# Patient Record
Sex: Female | Born: 1950 | Race: White | Hispanic: No | Smoking: Former smoker
Health system: Southern US, Community
[De-identification: ages and names within clinical notes are randomized; demographics above are authoritative.]

## PROBLEM LIST (undated history)

## (undated) DIAGNOSIS — N952 Postmenopausal atrophic vaginitis: Secondary | ICD-10-CM

## (undated) DIAGNOSIS — N898 Other specified noninflammatory disorders of vagina: Secondary | ICD-10-CM

## (undated) DIAGNOSIS — J449 Chronic obstructive pulmonary disease, unspecified: Secondary | ICD-10-CM

## (undated) DIAGNOSIS — M8580 Other specified disorders of bone density and structure, unspecified site: Secondary | ICD-10-CM

## (undated) DIAGNOSIS — R7301 Impaired fasting glucose: Secondary | ICD-10-CM

## (undated) DIAGNOSIS — R079 Chest pain, unspecified: Secondary | ICD-10-CM

## (undated) DIAGNOSIS — I10 Essential (primary) hypertension: Secondary | ICD-10-CM

## (undated) DIAGNOSIS — E785 Hyperlipidemia, unspecified: Secondary | ICD-10-CM

## (undated) DIAGNOSIS — I700 Atherosclerosis of aorta: Secondary | ICD-10-CM

## (undated) DIAGNOSIS — K5792 Diverticulitis of intestine, part unspecified, without perforation or abscess without bleeding: Secondary | ICD-10-CM

## (undated) DIAGNOSIS — Z87442 Personal history of urinary calculi: Secondary | ICD-10-CM

## (undated) DIAGNOSIS — K219 Gastro-esophageal reflux disease without esophagitis: Secondary | ICD-10-CM

## (undated) DIAGNOSIS — E119 Type 2 diabetes mellitus without complications: Secondary | ICD-10-CM

## (undated) DIAGNOSIS — Z87891 Personal history of nicotine dependence: Secondary | ICD-10-CM

## (undated) DIAGNOSIS — I499 Cardiac arrhythmia, unspecified: Secondary | ICD-10-CM

## (undated) DIAGNOSIS — R918 Other nonspecific abnormal finding of lung field: Secondary | ICD-10-CM

## (undated) DIAGNOSIS — Z66 Do not resuscitate: Secondary | ICD-10-CM

## (undated) HISTORY — DX: Other nonspecific abnormal finding of lung field: R91.8

## (undated) HISTORY — DX: Type 2 diabetes mellitus without complications: E11.9

## (undated) HISTORY — DX: Hyperlipidemia, unspecified: E78.5

## (undated) HISTORY — DX: Personal history of nicotine dependence: Z87.891

## (undated) HISTORY — DX: Do not resuscitate: Z66

## (undated) HISTORY — DX: Postmenopausal atrophic vaginitis: N95.2

## (undated) HISTORY — DX: Chest pain, unspecified: R07.9

## (undated) HISTORY — DX: Essential (primary) hypertension: I10

## (undated) HISTORY — DX: Impaired fasting glucose: R73.01

## (undated) HISTORY — DX: Other specified noninflammatory disorders of vagina: N89.8

## (undated) HISTORY — PX: OTHER SURGICAL HISTORY: SHX169

---

## 1982-10-25 HISTORY — PX: TMJ ARTHROPLASTY: SHX1066

## 2005-10-25 LAB — HM COLONOSCOPY

## 2006-06-29 ENCOUNTER — Ambulatory Visit: Payer: Self-pay | Admitting: Gastroenterology

## 2012-09-07 ENCOUNTER — Ambulatory Visit: Payer: Self-pay | Admitting: Family Medicine

## 2013-02-14 ENCOUNTER — Ambulatory Visit: Payer: Self-pay | Admitting: Family Medicine

## 2013-02-14 LAB — CREATININE, SERUM
Creatinine: 0.66 mg/dL (ref 0.60–1.30)
EGFR (African American): >60
EGFR (Non-African Amer.): >60

## 2013-07-10 ENCOUNTER — Emergency Department: Payer: Self-pay | Admitting: Internal Medicine

## 2013-07-10 LAB — COMPREHENSIVE METABOLIC PANEL
Albumin: 4.2 g/dL (ref 3.4–5.0)
Alkaline Phosphatase: 103 U/L (ref 50–136)
Anion Gap: 5 — ABNORMAL LOW (ref 7–16)
BUN: 23 mg/dL — ABNORMAL HIGH (ref 7–18)
Creatinine: 0.88 mg/dL (ref 0.60–1.30)
EGFR (Non-African Amer.): >60
Glucose: 132 mg/dL — ABNORMAL HIGH (ref 65–99)
Osmolality: 285 (ref 275–301)
SGPT (ALT): 38 U/L (ref 12–78)
Sodium: 140 mmol/L (ref 136–145)
Total Protein: 7.3 g/dL (ref 6.4–8.2)

## 2013-07-10 LAB — URINALYSIS, COMPLETE
Bacteria: NONE SEEN
Bilirubin,UR: NEGATIVE
Ketone: NEGATIVE
Ph: 6 (ref 4.5–8.0)
Protein: NEGATIVE
Specific Gravity: 1.015 (ref 1.003–1.030)
WBC UR: 5 /HPF (ref 0–5)

## 2013-07-10 LAB — CBC
HCT: 46.7 % (ref 35.0–47.0)
HGB: 16.1 g/dL — ABNORMAL HIGH (ref 12.0–16.0)
MCH: 30.9 pg (ref 26.0–34.0)
MCV: 90 fL (ref 80–100)
Platelet: 186 10*3/uL (ref 150–440)
RBC: 5.2 10*6/uL (ref 3.80–5.20)
RDW: 13.7 % (ref 11.5–14.5)

## 2013-07-13 ENCOUNTER — Encounter: Payer: Self-pay | Admitting: Cardiovascular Disease

## 2013-07-13 ENCOUNTER — Ambulatory Visit (INDEPENDENT_AMBULATORY_CARE_PROVIDER_SITE_OTHER): Payer: BC Managed Care – PPO | Admitting: Cardiovascular Disease

## 2013-07-13 ENCOUNTER — Encounter: Payer: Self-pay | Admitting: *Deleted

## 2013-07-13 VITALS — BP 141/80 | HR 74 | Ht 70.0 in | Wt 192.8 lb

## 2013-07-13 DIAGNOSIS — R42 Dizziness and giddiness: Secondary | ICD-10-CM | POA: Insufficient documentation

## 2013-07-13 DIAGNOSIS — E785 Hyperlipidemia, unspecified: Secondary | ICD-10-CM | POA: Insufficient documentation

## 2013-07-13 DIAGNOSIS — R9431 Abnormal electrocardiogram [ECG] [EKG]: Secondary | ICD-10-CM | POA: Insufficient documentation

## 2013-07-13 NOTE — Assessment & Plan Note (Signed)
Repeat EKG today showing no inferior wall abnormality. Essentially benign EKG. Suspect it was lead placement. Treadmill stress test ordered  today for symptoms of dizziness and numbness all over.

## 2013-07-13 NOTE — Patient Instructions (Addendum)
You are doing well. No medication changes were made.  Please increase your potassium intake  Your stress test was normal. He did not show any signs of blockage. No further testing needed at this time. These call our office if you have any worsening symptoms of dizziness and palpitations. We would look for heart arrhythmia  Please call us if you have new issues that need to be addressed before your next appt.

## 2013-07-13 NOTE — Assessment & Plan Note (Signed)
Encouraged her to work on her diet, exercise. Given her long smoking history, could consider low-dose statin for goal LDL goal less than 100

## 2013-07-13 NOTE — Procedures (Signed)
Treadmill ordered for recent epsiodes of chest pain.  Resting EKG shows NSR with rate of 74 bpm, no significant ST or T wave changes Resting blood pressure of 130/8. Stand bruce protocal was used.  Patient exercised for 6 min 0 sec,  Peak heart rate of 136 bpm.  This was 87 % of the maximum predicted heart rate (target heart rate 158). Achieved 7.1 METS No symptoms of chest pain or lightheadedness were reported at peak stress or in recovery.  Peak Blood pressure recorded was 160/80. Heart rate at 3 minutes in recovery was 108 bpm. No ST changes concerning for ischemia  FINAL IMPRESSION: Normal exercise stress test. No significant EKG changes concerning for ischemia. Good exercise tolerance.

## 2013-07-13 NOTE — Progress Notes (Signed)
Patient ID: Felicia Cisneros, female    DOB: [birth year], 62 y.o.   MRN: [id number]  HPI Comments: Mr. Teresi is a very pleasant 62 year old woman with long history of smoking, hypertension who presents by referral for evaluation of abnormal EKG, recent symptoms of dizziness and numbness.  She reports that in general she has been doing well. On 07/10/2013 she ate lunch and during her meal developed numbness all over, dizziness that lasted for approximately one hour. He denies any nausea, vomiting, diarrhea. Her husband took her to the emergency room. She had lab work, chest x-ray, EKG, head CT scan. Potassium was 3.3, negative troponin. Based on her EKG, there was some suggestion of inferior infarct and she was referred here today. CT scan of the head was essentially normal.  Since then she reports that she has been feeling well with no symptoms. She is active, she has been doing some exercises 2 days per week with weights, aerobic at a local gym. No symptoms with exertion. Husband is recovering from cancer. She reports that her blood pressure has been well controlled at home. In general she has no other complaints. She has never had cardiac workup in the past. Notes indicate LDL 130  EKG today shows normal sinus rhythm with rate 74 beats per minute, borderline low voltage EKG from the hospital shows normal sinus rhythm with rate 83 beats per minute, reading on the EKG suggesting inferior infarct. Is likely artifact/replacement  Outpatient Encounter Prescriptions as of 07/13/2013  Medication Sig Dispense Refill  . aspirin 81 MG tablet Take 81 mg by mouth daily.      . hydrochlorothiazide (MICROZIDE) 12.5 MG capsule Take 12.5 mg by mouth daily.       . IBUPROFEN PO Take by mouth as needed.      . Multiple Vitamin (MULTIVITAMIN) tablet Take 1 tablet by mouth daily.      . ranitidine (ZANTAC) 300 MG tablet Take 300 mg by mouth at bedtime.       . [DISCONTINUED] naproxen sodium (ANAPROX) 220 MG tablet  Take 220 mg by mouth 2 (two) times daily with a meal.        Review of Systems  Constitutional: Negative.   HENT: Negative.   Eyes: Negative.   Respiratory: Negative.   Cardiovascular: Negative.   Gastrointestinal: Negative.   Endocrine: Negative.   Musculoskeletal: Negative.   Skin: Negative.   Allergic/Immunologic: Negative.   Neurological: Positive for dizziness.       Felt numb all over  Hematological: Negative.   Psychiatric/Behavioral: Negative.   All other systems reviewed and are negative.    BP 141/80  Pulse 74  Ht 5\' 10"  (1.778 m)  Wt 192 lb 12 oz (87.431 kg)  BMI 27.66 kg/m2  Physical Exam  Constitutional: She is oriented to person, place, and time. She appears well-developed and well-nourished.  HENT:  Head: Normocephalic.  Nose: Nose normal.  Mouth/Throat: Oropharynx is clear and moist.  Eyes: Conjunctivae are normal. Pupils are equal, round, and reactive to light.  Neck: Normal range of motion. Neck supple. No JVD present.  Cardiovascular: Normal rate, regular rhythm, normal heart sounds and intact distal pulses.  Exam reveals no gallop and no friction rub.   No murmur heard. Pulmonary/Chest: Effort normal and breath sounds normal. No respiratory distress. She has no wheezes. She has no rales. She exhibits no tenderness.  Abdominal: Soft. Bowel sounds are normal. She exhibits no distension. There is no tenderness.  Musculoskeletal: Normal range of motion.  She exhibits no edema and no tenderness.  Lymphadenopathy:    She has no cervical adenopathy.  Neurological: She is alert and oriented to person, place, and time. Coordination normal.  Skin: Skin is warm and dry. No rash noted. No erythema.  Psychiatric: She has a normal mood and affect. Her behavior is normal. Judgment and thought content normal.

## 2013-07-13 NOTE — Patient Instructions (Addendum)
You are doing well. No medication changes were made.  We will perform a routine treadmill today for abn ekg and dizziness  Please call us if you have new issues that need to be addressed before your next appt.

## 2013-07-13 NOTE — Assessment & Plan Note (Signed)
Etiology of her dizziness and numbness all over is uncertain. If she has additional episodes, we have suggested she call he office for further evaluation. We could consider a Holter monitor to exclude arrhythmia. Treadmill test pending

## 2013-07-16 ENCOUNTER — Encounter: Payer: Self-pay | Admitting: *Deleted

## 2014-07-09 LAB — HM MAMMOGRAPHY

## 2014-07-26 ENCOUNTER — Ambulatory Visit: Payer: Self-pay | Admitting: Family Medicine

## 2015-04-21 ENCOUNTER — Other Ambulatory Visit: Payer: Self-pay

## 2015-04-21 DIAGNOSIS — Z1211 Encounter for screening for malignant neoplasm of colon: Secondary | ICD-10-CM

## 2015-04-21 LAB — FECAL OCCULT BLOOD, GUAIAC
SPECIMEN 1: NEGATIVE
SPECIMEN 3: NEGATIVE
Specimen 2: NEGATIVE

## 2015-05-19 ENCOUNTER — Telehealth: Payer: Self-pay | Admitting: Family Medicine

## 2015-05-19 NOTE — Telephone Encounter (Signed)
Legs very swollen and uncomfortable.  Not sure what to do or if she should be doing something.  Please call and advise.

## 2015-05-19 NOTE — Telephone Encounter (Signed)
I returned her call She says her feet and legs are swollen; started yesterday; never had this; left leg is a little fatter but thinks it is really both legs; ankles and toes and fat She thinks she missed her fluid pill Sitting her all day thinking it's going to better Not red or hot and no calf pain with walking No extra salt, but then did remember that she ate too salty lunch at [employer] Take an extra fluid pill, elevate feet and legs, calf exercise No shortness of breath Call tomorrow if not better

## 2015-06-03 ENCOUNTER — Telehealth: Payer: Self-pay

## 2015-06-03 NOTE — Telephone Encounter (Signed)
Please call pt ASAP she has a question about her medications, please call pt @ [phone]. Thanks.

## 2015-06-04 NOTE — Telephone Encounter (Signed)
Patient wanted to make sure her HCTZ is just once a day. Confirmed with patient.

## 2015-07-21 DIAGNOSIS — E119 Type 2 diabetes mellitus without complications: Secondary | ICD-10-CM | POA: Insufficient documentation

## 2015-07-21 DIAGNOSIS — N952 Postmenopausal atrophic vaginitis: Secondary | ICD-10-CM | POA: Insufficient documentation

## 2015-07-21 DIAGNOSIS — Z87891 Personal history of nicotine dependence: Secondary | ICD-10-CM | POA: Insufficient documentation

## 2015-07-21 DIAGNOSIS — I10 Essential (primary) hypertension: Secondary | ICD-10-CM | POA: Insufficient documentation

## 2015-07-25 ENCOUNTER — Encounter: Payer: Self-pay | Admitting: Family Medicine

## 2015-07-31 ENCOUNTER — Encounter: Payer: Self-pay | Admitting: Family Medicine

## 2015-08-05 ENCOUNTER — Telehealth: Payer: Self-pay | Admitting: *Deleted

## 2015-08-05 NOTE — Telephone Encounter (Signed)
Notified patient that annual lung cancer screening low dose CT scan is due. Confirmed that patient is within the age range of 55-77, and asymptomatic, (no signs or symptoms of lung cancer). The patient is a former smoker with a quit date of 2012, with a 88 pack year history. The shared decision making visit was done 07/26/14 Patient is agreeable for CT scan being scheduled.

## 2015-08-18 ENCOUNTER — Other Ambulatory Visit: Payer: Self-pay | Admitting: Family Medicine

## 2015-08-18 NOTE — Telephone Encounter (Signed)
Routing to provider  

## 2015-08-18 NOTE — Telephone Encounter (Signed)
Patient has an appt Oct 27th Rx approved

## 2015-08-20 ENCOUNTER — Encounter: Payer: Self-pay | Admitting: Family Medicine

## 2015-08-20 ENCOUNTER — Other Ambulatory Visit: Payer: Self-pay | Admitting: Family Medicine

## 2015-08-20 DIAGNOSIS — Z87891 Personal history of nicotine dependence: Secondary | ICD-10-CM | POA: Insufficient documentation

## 2015-08-20 HISTORY — DX: Personal history of nicotine dependence: Z87.891

## 2015-08-21 ENCOUNTER — Ambulatory Visit (INDEPENDENT_AMBULATORY_CARE_PROVIDER_SITE_OTHER): Payer: BLUE CROSS/BLUE SHIELD | Admitting: Family Medicine

## 2015-08-21 ENCOUNTER — Encounter: Payer: Self-pay | Admitting: Family Medicine

## 2015-08-21 VITALS — BP 123/74 | HR 64 | Temp 96.9°F | Ht 67.75 in | Wt 187.6 lb

## 2015-08-21 DIAGNOSIS — Z23 Encounter for immunization: Secondary | ICD-10-CM

## 2015-08-21 DIAGNOSIS — R7301 Impaired fasting glucose: Secondary | ICD-10-CM

## 2015-08-21 DIAGNOSIS — Z1211 Encounter for screening for malignant neoplasm of colon: Secondary | ICD-10-CM | POA: Diagnosis not present

## 2015-08-21 DIAGNOSIS — N952 Postmenopausal atrophic vaginitis: Secondary | ICD-10-CM

## 2015-08-21 DIAGNOSIS — I10 Essential (primary) hypertension: Secondary | ICD-10-CM | POA: Diagnosis not present

## 2015-08-21 DIAGNOSIS — Z113 Encounter for screening for infections with a predominantly sexual mode of transmission: Secondary | ICD-10-CM | POA: Diagnosis not present

## 2015-08-21 DIAGNOSIS — Z124 Encounter for screening for malignant neoplasm of cervix: Secondary | ICD-10-CM | POA: Diagnosis not present

## 2015-08-21 DIAGNOSIS — Z87891 Personal history of nicotine dependence: Secondary | ICD-10-CM

## 2015-08-21 DIAGNOSIS — R894 Abnormal immunological findings in specimens from other organs, systems and tissues: Secondary | ICD-10-CM

## 2015-08-21 DIAGNOSIS — Z0000 Encounter for general adult medical examination without abnormal findings: Secondary | ICD-10-CM

## 2015-08-21 NOTE — Patient Instructions (Addendum)
Try to limit saturated fats in your diet (bologna, hot dogs, barbeque, cheeseburgers, hamburgers, steak, bacon, sausage, cheese, etc.) and get more fresh fruits, vegetables, and whole grains  We'll get the Cologuard ordered for you  Health Maintenance, Female Adopting a healthy lifestyle and getting preventive care can go a long way to promote health and wellness. Talk with your health care provider about what schedule of regular examinations is right for you. This is a good chance for you to check in with your provider about disease prevention and staying healthy. In between checkups, there are plenty of things you can do on your own. Experts have done a lot of research about which lifestyle changes and preventive measures are most likely to keep you healthy. Ask your health care provider for more information. WEIGHT AND DIET  Eat a healthy diet  Be sure to include plenty of vegetables, fruits, low-fat dairy products, and lean protein.  Do not eat a lot of foods high in solid fats, added sugars, or salt.  Get regular exercise. This is one of the most important things you can do for your health.  Most adults should exercise for at least 150 minutes each week. The exercise should increase your heart rate and make you sweat (moderate-intensity exercise).  Most adults should also do strengthening exercises at least twice a week. This is in addition to the moderate-intensity exercise.  Maintain a healthy weight  Body mass index (BMI) is a measurement that can be used to identify possible weight problems. It estimates body fat based on height and weight. Your health care provider can help determine your BMI and help you achieve or maintain a healthy weight.  For females 46 years of age and older:   A BMI below 18.5 is considered underweight.  A BMI of 18.5 to 24.9 is normal.  A BMI of 25 to 29.9 is considered overweight.  A BMI of 30 and above is considered obese.  Watch levels of  cholesterol and blood lipids  You should start having your blood tested for lipids and cholesterol at 64 years of age, then have this test every 5 years.  You may need to have your cholesterol levels checked more often if:  Your lipid or cholesterol levels are high.  You are older than 64 years of age.  You are at high risk for heart disease.  CANCER SCREENING   Lung Cancer  Lung cancer screening is recommended for adults 52-64 years old who are at high risk for lung cancer because of a history of smoking.  A yearly low-dose CT scan of the lungs is recommended for people who:  Currently smoke.  Have quit within the past 15 years.  Have at least a 30-pack-year history of smoking. A pack year is smoking an average of one pack of cigarettes a day for 1 year.  Yearly screening should continue until it has been 15 years since you quit.  Yearly screening should stop if you develop a health problem that would prevent you from having lung cancer treatment.  Breast Cancer  Practice breast self-awareness. This means understanding how your breasts normally appear and feel.  It also means doing regular breast self-exams. Let your health care provider know about any changes, no matter how small.  If you are in your 20s or 30s, you should have a clinical breast exam (CBE) by a health care provider every 1-3 years as part of a regular health exam.  If you are 40 or  older, have a CBE every year. Also consider having a breast X-ray (mammogram) every year.  If you have a family history of breast cancer, talk to your health care provider about genetic screening.  If you are at high risk for breast cancer, talk to your health care provider about having an MRI and a mammogram every year.  Breast cancer gene (BRCA) assessment is recommended for women who have family members with BRCA-related cancers. BRCA-related cancers include:  Breast.  Ovarian.  Tubal.  Peritoneal  cancers.  Results of the assessment will determine the need for genetic counseling and BRCA1 and BRCA2 testing. Cervical Cancer Your health care provider may recommend that you be screened regularly for cancer of the pelvic organs (ovaries, uterus, and vagina). This screening involves a pelvic examination, including checking for microscopic changes to the surface of your cervix (Pap test). You may be encouraged to have this screening done every 3 years, beginning at age 86.  For women ages 61-65, health care providers may recommend pelvic exams and Pap testing every 3 years, or they may recommend the Pap and pelvic exam, combined with testing for human papilloma virus (HPV), every 5 years. Some types of HPV increase your risk of cervical cancer. Testing for HPV may also be done on women of any age with unclear Pap test results.  Other health care providers may not recommend any screening for nonpregnant women who are considered low risk for pelvic cancer and who do not have symptoms. Ask your health care provider if a screening pelvic exam is right for you.  If you have had past treatment for cervical cancer or a condition that could lead to cancer, you need Pap tests and screening for cancer for at least 20 years after your treatment. If Pap tests have been discontinued, your risk factors (such as having a new sexual partner) need to be reassessed to determine if screening should resume. Some women have medical problems that increase the chance of getting cervical cancer. In these cases, your health care provider may recommend more frequent screening and Pap tests. Colorectal Cancer  This type of cancer can be detected and often prevented.  Routine colorectal cancer screening usually begins at 64 years of age and continues through 64 years of age.  Your health care provider may recommend screening at an earlier age if you have risk factors for colon cancer.  Your health care provider may also  recommend using home test kits to check for hidden blood in the stool.  A small camera at the end of a tube can be used to examine your colon directly (sigmoidoscopy or colonoscopy). This is done to check for the earliest forms of colorectal cancer.  Routine screening usually begins at age 22.  Direct examination of the colon should be repeated every 5-10 years through 64 years of age. However, you may need to be screened more often if early forms of precancerous polyps or small growths are found. Skin Cancer  Check your skin from head to toe regularly.  Tell your health care provider about any new moles or changes in moles, especially if there is a change in a mole's shape or color.  Also tell your health care provider if you have a mole that is larger than the size of a pencil eraser.  Always use sunscreen. Apply sunscreen liberally and repeatedly throughout the day.  Protect yourself by wearing long sleeves, pants, a wide-brimmed hat, and sunglasses whenever you are outside. HEART DISEASE, DIABETES,  AND HIGH BLOOD PRESSURE   High blood pressure causes heart disease and increases the risk of stroke. High blood pressure is more likely to develop in:  People who have blood pressure in the high end of the normal range (130-139/85-89 mm Hg).  People who are overweight or obese.  People who are African American.  If you are 70-80 years of age, have your blood pressure checked every 3-5 years. If you are 7 years of age or older, have your blood pressure checked every year. You should have your blood pressure measured twice--once when you are at a hospital or clinic, and once when you are not at a hospital or clinic. Record the average of the two measurements. To check your blood pressure when you are not at a hospital or clinic, you can use:  An automated blood pressure machine at a pharmacy.  A home blood pressure monitor.  If you are between 56 years and 59 years old, ask your health  care provider if you should take aspirin to prevent strokes.  Have regular diabetes screenings. This involves taking a blood sample to check your fasting blood sugar level.  If you are at a normal weight and have a low risk for diabetes, have this test once every three years after 64 years of age.  If you are overweight and have a high risk for diabetes, consider being tested at a younger age or more often. PREVENTING INFECTION  Hepatitis B  If you have a higher risk for hepatitis B, you should be screened for this virus. You are considered at high risk for hepatitis B if:  You were born in a country where hepatitis B is common. Ask your health care provider which countries are considered high risk.  Your parents were born in a high-risk country, and you have not been immunized against hepatitis B (hepatitis B vaccine).  You have HIV or AIDS.  You use needles to inject street drugs.  You live with someone who has hepatitis B.  You have had sex with someone who has hepatitis B.  You get hemodialysis treatment.  You take certain medicines for conditions, including cancer, organ transplantation, and autoimmune conditions. Hepatitis C  Blood testing is recommended for:  Everyone born from [birth year] through 1965.  Anyone with known risk factors for hepatitis C. Sexually transmitted infections (STIs)  You should be screened for sexually transmitted infections (STIs) including gonorrhea and chlamydia if:  You are sexually active and are younger than 64 years of age.  You are older than 64 years of age and your health care provider tells you that you are at risk for this type of infection.  Your sexual activity has changed since you were last screened and you are at an increased risk for chlamydia or gonorrhea. Ask your health care provider if you are at risk.  If you do not have HIV, but are at risk, it may be recommended that you take a prescription medicine daily to prevent HIV  infection. This is called pre-exposure prophylaxis (PrEP). You are considered at risk if:  You are sexually active and do not regularly use condoms or know the HIV status of your partner(s).  You take drugs by injection.  You are sexually active with a partner who has HIV. Talk with your health care provider about whether you are at high risk of being infected with HIV. If you choose to begin PrEP, you should first be tested for HIV. You should then be  tested every 3 months for as long as you are taking PrEP.  PREGNANCY   If you are premenopausal and you may become pregnant, ask your health care provider about preconception counseling.  If you may become pregnant, take 400 to 800 micrograms (mcg) of folic acid every day.  If you want to prevent pregnancy, talk to your health care provider about birth control (contraception). OSTEOPOROSIS AND MENOPAUSE   Osteoporosis is a disease in which the bones lose minerals and strength with aging. This can result in serious bone fractures. Your risk for osteoporosis can be identified using a bone density scan.  If you are 80 years of age or older, or if you are at risk for osteoporosis and fractures, ask your health care provider if you should be screened.  Ask your health care provider whether you should take a calcium or vitamin D supplement to lower your risk for osteoporosis.  Menopause may have certain physical symptoms and risks.  Hormone replacement therapy may reduce some of these symptoms and risks. Talk to your health care provider about whether hormone replacement therapy is right for you.  HOME CARE INSTRUCTIONS   Schedule regular health, dental, and eye exams.  Stay current with your immunizations.   Do not use any tobacco products including cigarettes, chewing tobacco, or electronic cigarettes.  If you are pregnant, do not drink alcohol.  If you are breastfeeding, limit how much and how often you drink alcohol.  Limit  alcohol intake to no more than 1 drink per day for nonpregnant women. One drink equals 12 ounces of beer, 5 ounces of wine, or 1 ounces of hard liquor.  Do not use street drugs.  Do not share needles.  Ask your health care provider for help if you need support or information about quitting drugs.  Tell your health care provider if you often feel depressed.  Tell your health care provider if you have ever been abused or do not feel safe at home.   This information is not intended to replace advice given to you by your health care provider. Make sure you discuss any questions you have with your health care provider.   Document Released: 04/26/2011 Document Revised: 11/01/2014 Document Reviewed: 09/12/2013 Elsevier Interactive Patient Education [employer].

## 2015-08-21 NOTE — Progress Notes (Signed)
Patient ID: Felicia Cisneros, female   DOB: 1951/08/29, 64 y.o.   MRN: [id number]   Subjective:   Felicia Cisneros is a 64 y.o. female here for a complete physical exam  Interim issues since last visit: husband died 2023/03/16; using simply sleep pill if needed; advil pm instead if legs hurt; feels tired and hungover the next day  USPSTF grade A and B recommendations Alcohol: very occasional Depression: Depression screen PHQ 2/9 08/21/2015  Decreased Interest 3  Down, Depressed, Hopeless 2  PHQ - 2 Score 5  Altered sleeping 3  Tired, decreased energy 0  Change in appetite 0  Feeling bad or failure about yourself  0  Trouble concentrating 0  Moving slowly or fidgety/restless 0  Suicidal thoughts 0  PHQ-9 Score 8  Difficult doing work/chores Not difficult at all  "it is what it is" she says, having lost her husband less than 6 months ago; getting by; has actually not cried in one month  Hypertension: well-controlled on one lowdose pill Obesity:  Tobacco use: down 5 pounds since spring; 180 pounds at home; she has started a green drink to lose weight, like a meal replacement, lots of vitamins, vegetable powder, puts in pineapple, cherries; that has helped curb her appetite HIV, hep B, hep C: check every thing tomorrow STD testing and prevention (chl/gon/syphilis): no Asked about herpes, was told years ago, she had it, never had symptoms though; had like a yeast infection, Dr. Bridget Hartshorn told her she had herpes, would like blood test Lipids: tomorrow Glucose: tomorrow Colorectal cancer: 2007; she was told 10 years; would rather do Cologuard Breast cancer: last done Sept 2015; she'll go every 2 years BRCA gene screening: no ovarian cancer, no breast cancer Intimate partner violence: no Cervical cancer screening: May 2014, negative; no hormones Lung cancer: yearly CT scan, gets one Monday at the cancer center as part of a study Osteoporosis: had DEXA with Dr. Davis Gourd, was thin at her  ankle, then had one at [facility], start at 89 Fall prevention/vitamin D: discussed, two falls, clumsy; start vit D AAA: not yet Aspirin: taking aspirin Diet: better than before Exercise: 2 times a week Skin cancer: no tanning beds,s ees derm yearly   Past Medical History  Diagnosis Date  . Leukorrhea, not specified as infective   . Heartburn   . Hyperlipidemia   . Unspecified essential hypertension   . IFG (impaired fasting glucose)   . Atrophic vaginitis   . History of tobacco use   . Personal history of tobacco use, presenting hazards to health 08/20/2015   Past Surgical History  Procedure Laterality Date  . Colonoscopy    . Tmj arthroplasty  1984   Family History  Problem Relation Age of Onset  . Stroke Mother   . Hypertension Mother   . Hyperlipidemia Mother   . Diabetes Mother     pre-diabetic  . Thyroid disease Sister   . Diabetes Brother   . Irregular heart beat Brother   . Stroke Maternal Grandmother    Social History  Substance Use Topics  . Smoking status: Former Smoker -- 3.00 packs/day for 45 years    Types: Cigarettes  . Smokeless tobacco: Never Used  . Alcohol Use: Yes     Comment: Rarely   Review of Systems  Constitutional: Negative for unexpected weight change.  HENT: Negative for hearing loss.   Eyes: Negative for visual disturbance.  Respiratory: Negative for shortness of breath.   Cardiovascular: Negative for  chest pain.  Gastrointestinal: Positive for anal bleeding (one month ago, was lifting something and had a spot of BRBPR afterwards, thinks hemorrhoid popped; no bleeding since then). Negative for abdominal pain.   Objective:   Filed Vitals:   08/21/15 1455  BP: 123/74  Pulse: 64  Temp: 96.9 F (36.1 C)  Height: 5' 7.75" (1.721 m)  Weight: 187 lb 9.6 oz (85.095 kg)  SpO2: 96%   Body mass index is 28.73 kg/(m^2). Wt Readings from Last 3 Encounters:  08/21/15 187 lb 9.6 oz (85.095 kg)  12/27/14 192 lb (87.091 kg)   07/13/13 192 lb 12 oz (87.431 kg)   Physical Exam  Constitutional: She appears well-developed and well-nourished.  HENT:  Head: Normocephalic and atraumatic.  Eyes: Conjunctivae and EOM are normal. Right eye exhibits no hordeolum. Left eye exhibits no hordeolum. No scleral icterus.  Neck: Carotid bruit is not present. No thyromegaly present.  Cardiovascular: Normal rate, regular rhythm, S1 normal, S2 normal and normal heart sounds.   No extrasystoles are present.  Pulmonary/Chest: Effort normal and breath sounds normal. No respiratory distress. Right breast exhibits no inverted nipple, no mass, no nipple discharge, no skin change and no tenderness. Left breast exhibits no inverted nipple, no mass, no nipple discharge, no skin change and no tenderness. Breasts are symmetrical.  Abdominal: Soft. Normal appearance and bowel sounds are normal. She exhibits no distension, no abdominal bruit, no pulsatile midline mass and no mass. There is no hepatosplenomegaly. There is no tenderness. No hernia.  Genitourinary: Uterus normal. Pelvic exam was performed with patient prone. There is no rash or lesion on the right labia. There is no rash or lesion on the left labia. Cervix exhibits no motion tenderness. Right adnexum displays no mass, no tenderness and no fullness. Left adnexum displays no mass, no tenderness and no fullness.  Mild atrophic vaginitis  Musculoskeletal: Normal range of motion. She exhibits no edema.  Lymphadenopathy:       Head (right side): No submandibular adenopathy present.       Head (left side): No submandibular adenopathy present.    She has no cervical adenopathy.    She has no axillary adenopathy.  Neurological: She is alert. She displays no tremor. No cranial nerve deficit. She exhibits normal muscle tone. Gait normal.  Skin: Skin is warm and dry. No bruising and no ecchymosis noted. No cyanosis. No pallor.  Psychiatric: Her speech is normal and behavior is normal. Thought  content normal. Her mood appears not anxious. She does not exhibit a depressed mood.  Briefly tearful when we talked about her husband (who died in 03-06-2023), but otherwise, good eye contact with examiner, full range of affect, good hygiene    Assessment/Plan:   Problem List Items Addressed This Visit      Cardiovascular and Mediastinum   Essential hypertension    Well-controlled today, continue current med        Endocrine   IFG (impaired fasting glucose)    Check A1C today; modest weight loss encouraged      Relevant Orders   Hgb A1c w/o eAG     Genitourinary   Atrophic vaginitis    Noted on exam        Other   History of tobacco use    She is already getting yearly chest CTs through the cancer center for screening      Needs flu shot    Patient wants to return for flu shot tomorrow  Preventative health care - Primary    USPSTF grade A and B recommendations reviewed with patient; age-appropriate recommendations, preventive care, screening tests, etc discussed and encouraged; healthy living encouraged; see AVS for patient education given to patient      Relevant Orders   CBC with Differential/Platelet   Lipid Panel w/o Chol/HDL Ratio   Comprehensive metabolic panel   Colon cancer screening    She does not want a colonoscopy; she is willing to do Cologuard; ordered      Relevant Orders   Cologuard   Herpes simplex antibody positive    Patient says she was told she had herpes a while ago by another provider, but wants to be tested to see if she truly does have herpes; she does not remember having had any other symptoms other than one time when she thinks she had a yeast infection; will check antibodies when she comes back for lboodwork      Cervical cancer screening    Thin prep collected      Relevant Orders   Pap liquid-based and HPV (high risk)   Screen for STD (sexually transmitted disease)    Patient agrees to testing; asymptomatic      Relevant  Orders   HIV antibody   Hepatitis C antibody   Hepatitis B surface antigen   HSV(herpes simplex vrs) 1+2 ab-IgG       Meds ordered this encounter  Medications  . Multiple Vitamins-Minerals (CENTRUM SILVER ADULT 50+ PO)    Sig: Take by mouth.    Follow up plan: Return in about 1 year (around 08/20/2016) for complete physical.  An after-visit summary was printed and given to the patient at [facility].  Please see the patient instructions which may contain other information and recommendations beyond what is mentioned above in the assessment and plan.  Orders Placed This Encounter  Procedures  . Cologuard  . HIV antibody  . Hepatitis C antibody  . Hepatitis B surface antigen  . HSV(herpes simplex vrs) 1+2 ab-IgG  . CBC with Differential/Platelet  . Lipid Panel w/o Chol/HDL Ratio  . Comprehensive metabolic panel  . Hgb A1c w/o eAG

## 2015-08-22 ENCOUNTER — Ambulatory Visit: Payer: BLUE CROSS/BLUE SHIELD

## 2015-08-22 ENCOUNTER — Other Ambulatory Visit: Payer: BLUE CROSS/BLUE SHIELD

## 2015-08-22 DIAGNOSIS — Z23 Encounter for immunization: Secondary | ICD-10-CM | POA: Insufficient documentation

## 2015-08-22 DIAGNOSIS — Z113 Encounter for screening for infections with a predominantly sexual mode of transmission: Secondary | ICD-10-CM | POA: Insufficient documentation

## 2015-08-22 DIAGNOSIS — Z0000 Encounter for general adult medical examination without abnormal findings: Secondary | ICD-10-CM | POA: Insufficient documentation

## 2015-08-22 DIAGNOSIS — R894 Abnormal immunological findings in specimens from other organs, systems and tissues: Secondary | ICD-10-CM | POA: Insufficient documentation

## 2015-08-22 DIAGNOSIS — R7301 Impaired fasting glucose: Secondary | ICD-10-CM

## 2015-08-22 DIAGNOSIS — Z1211 Encounter for screening for malignant neoplasm of colon: Secondary | ICD-10-CM | POA: Insufficient documentation

## 2015-08-22 DIAGNOSIS — Z124 Encounter for screening for malignant neoplasm of cervix: Secondary | ICD-10-CM | POA: Insufficient documentation

## 2015-08-22 NOTE — Assessment & Plan Note (Signed)
Patient wants to return for flu shot tomorrow

## 2015-08-22 NOTE — Assessment & Plan Note (Signed)
She does not want a colonoscopy; she is willing to do Cologuard; ordered

## 2015-08-22 NOTE — Assessment & Plan Note (Addendum)
Check A1C today; modest weight loss encouraged

## 2015-08-22 NOTE — Assessment & Plan Note (Signed)
Patient says she was told she had herpes a while ago by another provider, but wants to be tested to see if she truly does have herpes; she does not remember having had any other symptoms other than one time when she thinks she had a yeast infection; will check antibodies when she comes back for lboodwork

## 2015-08-22 NOTE — Assessment & Plan Note (Signed)
She is already getting yearly chest CTs through the cancer center for screening

## 2015-08-22 NOTE — Assessment & Plan Note (Signed)
Well-controlled today, continue current med

## 2015-08-22 NOTE — Assessment & Plan Note (Signed)
Thin prep collected 

## 2015-08-22 NOTE — Assessment & Plan Note (Signed)
USPSTF grade A and B recommendations reviewed with patient; age-appropriate recommendations, preventive care, screening tests, etc discussed and encouraged; healthy living encouraged; see AVS for patient education given to patient  

## 2015-08-22 NOTE — Assessment & Plan Note (Signed)
Patient agrees to testing; asymptomatic

## 2015-08-22 NOTE — Assessment & Plan Note (Signed)
Noted on exam ° °

## 2015-08-23 LAB — COMPREHENSIVE METABOLIC PANEL
ALK PHOS: 88 IU/L (ref 39–117)
ALT: 21 IU/L (ref 0–32)
AST: 17 IU/L (ref 0–40)
Albumin/Globulin Ratio: 2 (ref 1.1–2.5)
Albumin: 4.3 g/dL (ref 3.6–4.8)
BUN/Creatinine Ratio: 27 — ABNORMAL HIGH (ref 11–26)
BUN: 21 mg/dL (ref 8–27)
Bilirubin Total: 0.5 mg/dL (ref 0.0–1.2)
CHLORIDE: 103 mmol/L (ref 97–106)
CO2: 24 mmol/L (ref 18–29)
CREATININE: 0.78 mg/dL (ref 0.57–1.00)
Calcium: 9.3 mg/dL (ref 8.7–10.3)
GFR calc Af Amer: 93 mL/min/{1.73_m2} (ref >59)
GFR calc non Af Amer: 81 mL/min/{1.73_m2} (ref >59)
GLOBULIN, TOTAL: 2.1 g/dL (ref 1.5–4.5)
GLUCOSE: 125 mg/dL — AB (ref 65–99)
Potassium: 4 mmol/L (ref 3.5–5.2)
SODIUM: 142 mmol/L (ref 136–144)
Total Protein: 6.4 g/dL (ref 6.0–8.5)

## 2015-08-23 LAB — CBC WITH DIFFERENTIAL/PLATELET
BASOS ABS: 0.1 10*3/uL (ref 0.0–0.2)
BASOS: 1 %
EOS (ABSOLUTE): 0.2 10*3/uL (ref 0.0–0.4)
Eos: 4 %
HEMATOCRIT: 42.4 % (ref 34.0–46.6)
HEMOGLOBIN: 14.9 g/dL (ref 11.1–15.9)
Immature Grans (Abs): 0 10*3/uL (ref 0.0–0.1)
Immature Granulocytes: 0 %
LYMPHS ABS: 1.9 10*3/uL (ref 0.7–3.1)
Lymphs: 34 %
MCH: 31.4 pg (ref 26.6–33.0)
MCHC: 35.1 g/dL (ref 31.5–35.7)
MCV: 90 fL (ref 79–97)
MONOCYTES: 5 %
Monocytes Absolute: 0.3 10*3/uL (ref 0.1–0.9)
NEUTROS ABS: 3.3 10*3/uL (ref 1.4–7.0)
Neutrophils: 56 %
Platelets: 229 10*3/uL (ref 150–379)
RBC: 4.74 x10E6/uL (ref 3.77–5.28)
RDW: 13.3 % (ref 12.3–15.4)
WBC: 5.8 10*3/uL (ref 3.4–10.8)

## 2015-08-23 LAB — HGB A1C W/O EAG: Hgb A1c MFr Bld: 6.5 % — ABNORMAL HIGH (ref 4.8–5.6)

## 2015-08-23 LAB — LIPID PANEL W/O CHOL/HDL RATIO
CHOLESTEROL TOTAL: 139 mg/dL (ref 100–199)
HDL: 43 mg/dL (ref >39)
LDL CALC: 71 mg/dL (ref 0–99)
TRIGLYCERIDES: 124 mg/dL (ref 0–149)
VLDL Cholesterol Cal: 25 mg/dL (ref 5–40)

## 2015-08-23 LAB — HEPATITIS B SURFACE ANTIGEN: Hepatitis B Surface Ag: NEGATIVE

## 2015-08-23 LAB — HSV(HERPES SIMPLEX VRS) I + II AB-IGG
HSV 1 GLYCOPROTEIN G AB, IGG: 8.07 {index} — AB (ref 0.00–0.90)
HSV 2 Glycoprotein G Ab, IgG: <0.91 index (ref 0.00–0.90)

## 2015-08-23 LAB — HIV ANTIBODY (ROUTINE TESTING W REFLEX): HIV Screen 4th Generation wRfx: NONREACTIVE

## 2015-08-23 LAB — HEPATITIS C ANTIBODY: Hep C Virus Ab: <0.1 s/co ratio (ref 0.0–0.9)

## 2015-08-25 ENCOUNTER — Telehealth: Payer: Self-pay | Admitting: Family Medicine

## 2015-08-25 ENCOUNTER — Ambulatory Visit
Admission: RE | Admit: 2015-08-25 | Discharge: 2015-08-25 | Disposition: A | Discharge status: Home / Self Care | Payer: BLUE CROSS/BLUE SHIELD | Source: Ambulatory Visit | Attending: Family Medicine | Admitting: Family Medicine

## 2015-08-25 DIAGNOSIS — R894 Abnormal immunological findings in specimens from other organs, systems and tissues: Secondary | ICD-10-CM

## 2015-08-25 DIAGNOSIS — K760 Fatty (change of) liver, not elsewhere classified: Secondary | ICD-10-CM | POA: Diagnosis not present

## 2015-08-25 DIAGNOSIS — Z87891 Personal history of nicotine dependence: Secondary | ICD-10-CM | POA: Diagnosis present

## 2015-08-25 DIAGNOSIS — J432 Centrilobular emphysema: Secondary | ICD-10-CM | POA: Diagnosis not present

## 2015-08-25 DIAGNOSIS — E119 Type 2 diabetes mellitus without complications: Secondary | ICD-10-CM

## 2015-08-25 LAB — PAP LB AND HPV HIGH-RISK: PAP SMEAR COMMENT: 0

## 2015-08-25 NOTE — Telephone Encounter (Signed)
I called, but she's driving, on her way to CT scan for the study she is in; I'll try her later today or tomorrow

## 2015-08-26 MED ORDER — METFORMIN HCL ER 500 MG PO TB24: 500.0000 mg | ORAL_TABLET | Freq: Every day | ORAL | Status: DC

## 2015-08-26 NOTE — Telephone Encounter (Signed)
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

## 2015-08-26 NOTE — Assessment & Plan Note (Signed)
Discussed with patient by phone; start metformin; refer to diabetic educator

## 2015-08-26 NOTE — Telephone Encounter (Signed)
Labs mailed to patient.

## 2015-09-03 ENCOUNTER — Encounter: Payer: Self-pay | Admitting: *Deleted

## 2015-09-03 ENCOUNTER — Encounter: Payer: BLUE CROSS/BLUE SHIELD | Attending: Family Medicine | Admitting: *Deleted

## 2015-09-03 VITALS — BP 120/84 | Ht 69.0 in | Wt 189.4 lb

## 2015-09-03 DIAGNOSIS — E119 Type 2 diabetes mellitus without complications: Secondary | ICD-10-CM

## 2015-09-03 NOTE — Patient Instructions (Addendum)
Check blood sugars 2 x day before breakfast and 2 hrs after supper - 3 x week Exercise: Continue program for   60  minutes  2  days a week and gradually increase to 150 minutes/week Eat 3 meals day,  1-2  snacks a day Space meals 4-6 hours apart Limit fruit juices Make an eye doctor appointment Bring blood sugar records to the next class Call your doctor for a prescription for:  1. Meter strips (type) One Touch Verio checking  3-4 times per week  2. Lancets (type) One Touch Delica checking  3-4   times per week

## 2015-09-03 NOTE — Progress Notes (Signed)
Diabetes Self-Management Education  Visit Type: First/Initial  Appt. Start Time: 1420 Appt. End Time: 4481  09/03/2015  [name], identified by name and date of birth, is a 64 y.o. female with a diagnosis of Diabetes: Type 2.   ASSESSMENT  Blood pressure 120/84, height 5\' 9"  (1.753 m), weight 189 lb 6.4 oz (85.911 kg). Body mass index is 27.96 kg/(m^2).      Diabetes Self-Management Education - 09/03/15 1635    Visit Information   Visit Type First/Initial   Initial Visit   Diabetes Type Type 2   Are you currently following a meal plan? No   Are you taking your medications as prescribed? Yes   Date Diagnosed last month   Health Coping   How would you rate your overall health? Good   Psychosocial Assessment   Patient Belief/Attitude about Diabetes Other (comment)  "Dreading change of diet"   Self-care barriers None   Self-management support Family;Doctor's office   Other persons present Family Member  sister   Patient Concerns Nutrition/Meal planning;Glycemic Control;Weight Control;Monitoring   Special Needs None   Preferred Learning Style Auditory   Learning Readiness Contemplating   How often do you need to have someone help you when you read instructions, pamphlets, or other written materials from your doctor or pharmacy? 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 0 times/day (not testing)  Provided One Touch Verio meter and instructed on use. BG upon return demonstration was 108 mg/dL at 4:00 pm - 6 hrs pp.   Have you had a dilated eye exam in the past 12 months? No   Have you had a dental exam in the past 12 months? Yes  appt this month   Are you checking your feet? No   Dietary Intake   Breakfast oatmeal, cranberries, brown sugar, pecans, milk; juice/fruit drink every other day   Lunch ham sandwich,  baked cheetos, milk   Dinner ribeye, onion, carrots, garlic bread; microwave meal, milk   Beverage(s) milk, unsweetened tea and coffee   Exercise   Exercise Type Light (walking / raking leaves)   How many days per week to you exercise? 2   How many minutes per day do you exercise? 60   Total minutes per week of exercise 120   Patient Education   Previous Diabetes Education No   Disease state  Definition of diabetes, type 1 and 2, and the diagnosis of diabetes;Factors that contribute to the development of diabetes   Nutrition management  Role of diet in the treatment of diabetes and the relationship between the three main macronutrients and blood glucose level;Carbohydrate counting   Physical activity and exercise  Role of exercise on diabetes management, blood pressure control and cardiac health.   Medications Reviewed patients medication for diabetes, action, purpose, timing of dose and side effects.   Monitoring Taught/evaluated SMBG meter.;Purpose and frequency of SMBG.;Identified appropriate SMBG and/or A1C goals.   Chronic complications Relationship between chronic complications and blood glucose control   Psychosocial adjustment Identified and addressed patients feelings and concerns about diabetes   Individualized Goals (developed by patient)   Reducing Risk Improve blood sugars Lose weight Become more fit   Outcomes   Expected Outcomes Demonstrated interest in learning. Expect positive outcomes      Individualized Plan for  Diabetes Self-Management Training:   Learning Objective:  Patient will have a greater understanding of diabetes self-management. Patient education plan is to attend individual and/or group sessions per assessed needs and concerns.   Plan:   Patient Instructions  Check blood sugars 2 x day before breakfast and 2 hrs after supper - 3 x week Exercise: Continue program for   60  minutes  2  days a week and gradually increase to 150 minutes/week Eat 3 meals  day,  1-2  snacks a day Space meals 4-6 hours apart Limit fruit juices Make an eye doctor appointment Bring blood sugar records to the next class Call your doctor for a prescription for:  1. Meter strips (type) One Touch Verio checking  3-4 times per week  2. Lancets (type) One Touch Delica checking  3-4   times per week  Expected Outcomes:  Demonstrated interest in learning. Expect positive outcomes  Education material provided:  General Meal Planning Guidelines Meter - One Touch Verio  If problems or questions, patient to contact team via:   Johny Drilling, [place], [place], CDE [phone]  Future DSME appointment:   September 25, 2015 for Class 1

## 2015-09-08 ENCOUNTER — Other Ambulatory Visit: Payer: Self-pay | Admitting: Family Medicine

## 2015-09-08 NOTE — Telephone Encounter (Signed)
Your patient.  Thanks 

## 2015-09-08 NOTE — Telephone Encounter (Signed)
Last lipids and sgpt reviewed; Rx approved 

## 2015-09-09 ENCOUNTER — Telehealth: Payer: Self-pay | Admitting: *Deleted

## 2015-09-09 ENCOUNTER — Other Ambulatory Visit: Payer: Self-pay | Admitting: Family Medicine

## 2015-09-09 NOTE — Telephone Encounter (Signed)
States that she was told to call for results if she had not heard form [place], After discussing with L Herring, AGNP-C, I informed her that everything is all right and she will need another scan in a year. She said that is fine , just call me when it is scheduled with the date and time

## 2015-09-22 ENCOUNTER — Other Ambulatory Visit: Payer: Self-pay | Admitting: Family Medicine

## 2015-09-22 NOTE — Telephone Encounter (Signed)
Dr Lada patient-routing to provider. 

## 2015-09-25 ENCOUNTER — Encounter: Payer: BLUE CROSS/BLUE SHIELD | Attending: Family Medicine | Admitting: Dietician

## 2015-09-25 ENCOUNTER — Encounter: Payer: Self-pay | Admitting: Dietician

## 2015-09-25 VITALS — Ht 69.0 in | Wt 187.5 lb

## 2015-09-25 DIAGNOSIS — E119 Type 2 diabetes mellitus without complications: Secondary | ICD-10-CM | POA: Insufficient documentation

## 2015-09-25 NOTE — Progress Notes (Signed)

## 2015-09-29 ENCOUNTER — Telehealth: Payer: Self-pay | Admitting: Family Medicine

## 2015-09-29 ENCOUNTER — Other Ambulatory Visit: Payer: Self-pay | Admitting: Family Medicine

## 2015-09-29 DIAGNOSIS — E119 Type 2 diabetes mellitus without complications: Secondary | ICD-10-CM

## 2015-09-29 NOTE — Telephone Encounter (Signed)
Patient had returned my call and left a voicemail. No answer when I called her back.

## 2015-09-29 NOTE — Telephone Encounter (Signed)
One touch verio test strips to test once daily

## 2015-09-29 NOTE — Telephone Encounter (Signed)
Left detailed message on patient's identified voicemail that I have refaxed her order to cologuard. I advised her if she has not heard from them in a week to let me know.

## 2015-09-29 NOTE — Telephone Encounter (Signed)
Patient wants to know if she will get a call about [employer]. [phone] please call patient.

## 2015-09-29 NOTE — Telephone Encounter (Signed)
I see order for the cologuard in here, but I do not see an order form. I will send a new one to them incase it was not done. Left message for patient to call.

## 2015-09-29 NOTE — Telephone Encounter (Signed)
Routing to provider  

## 2015-09-30 MED ORDER — GLUCOSE BLOOD VI STRP: ORAL_STRIP | Status: DC

## 2015-09-30 NOTE — Assessment & Plan Note (Signed)
Test strips Rxd

## 2015-10-06 ENCOUNTER — Other Ambulatory Visit: Payer: Self-pay | Admitting: Family Medicine

## 2015-10-06 LAB — COLOGUARD: Cologuard: NEGATIVE

## 2015-10-06 NOTE — Telephone Encounter (Signed)
Your patient 

## 2015-10-06 NOTE — Telephone Encounter (Signed)
K+ and creatinine from October 2016 reviewed; Rx approved

## 2015-10-09 ENCOUNTER — Encounter: Payer: BLUE CROSS/BLUE SHIELD | Admitting: Dietician

## 2015-10-09 VITALS — BP 116/80 | Ht 69.0 in | Wt 183.9 lb

## 2015-10-09 DIAGNOSIS — E119 Type 2 diabetes mellitus without complications: Secondary | ICD-10-CM | POA: Diagnosis not present

## 2015-10-09 NOTE — Progress Notes (Signed)

## 2015-10-16 ENCOUNTER — Telehealth: Payer: Self-pay | Admitting: Family Medicine

## 2015-10-16 NOTE — Telephone Encounter (Signed)
Chart updated with cologuard info.

## 2015-10-16 NOTE — Telephone Encounter (Signed)
Let patient know that the Cologuard was negative; great news; next DNA testing in 1-3 years Please update HM too; thank you

## 2015-10-16 NOTE — Telephone Encounter (Signed)
Patient notified

## 2015-10-30 ENCOUNTER — Telehealth: Payer: Self-pay | Admitting: *Deleted

## 2015-10-30 NOTE — Telephone Encounter (Signed)
Received call from patient that she would need to reschedule her class today. She will make up Class 2 on January 26.

## 2015-11-12 ENCOUNTER — Other Ambulatory Visit: Payer: Self-pay | Admitting: Family Medicine

## 2015-11-12 NOTE — Telephone Encounter (Signed)
August 22, 2015 labs reviewed She has appt Nov 27, 2015 Rx approved

## 2015-11-18 LAB — HM DIABETES EYE EXAM

## 2015-11-20 ENCOUNTER — Encounter: Payer: BLUE CROSS/BLUE SHIELD | Attending: Family Medicine | Admitting: *Deleted

## 2015-11-20 ENCOUNTER — Encounter: Payer: Self-pay | Admitting: *Deleted

## 2015-11-20 VITALS — Wt 183.1 lb

## 2015-11-20 DIAGNOSIS — E119 Type 2 diabetes mellitus without complications: Secondary | ICD-10-CM | POA: Insufficient documentation

## 2015-11-20 NOTE — Progress Notes (Signed)

## 2015-11-24 ENCOUNTER — Encounter: Payer: Self-pay | Admitting: *Deleted

## 2015-11-27 ENCOUNTER — Ambulatory Visit (INDEPENDENT_AMBULATORY_CARE_PROVIDER_SITE_OTHER): Payer: BLUE CROSS/BLUE SHIELD | Admitting: Family Medicine

## 2015-11-27 ENCOUNTER — Encounter: Payer: Self-pay | Admitting: Family Medicine

## 2015-11-27 VITALS — BP 127/76 | HR 73 | Temp 97.2°F | Wt 178.0 lb

## 2015-11-27 DIAGNOSIS — E119 Type 2 diabetes mellitus without complications: Secondary | ICD-10-CM

## 2015-11-27 DIAGNOSIS — I10 Essential (primary) hypertension: Secondary | ICD-10-CM | POA: Diagnosis not present

## 2015-11-27 DIAGNOSIS — Z23 Encounter for immunization: Secondary | ICD-10-CM | POA: Diagnosis not present

## 2015-11-27 DIAGNOSIS — Q846 Other congenital malformations of nails: Secondary | ICD-10-CM | POA: Diagnosis not present

## 2015-11-27 DIAGNOSIS — Z5181 Encounter for therapeutic drug level monitoring: Secondary | ICD-10-CM | POA: Diagnosis not present

## 2015-11-27 DIAGNOSIS — E785 Hyperlipidemia, unspecified: Secondary | ICD-10-CM | POA: Diagnosis not present

## 2015-11-27 MED ORDER — ACCU-CHEK SOFT TOUCH LANCETS MISC: Status: DC

## 2015-11-27 NOTE — Progress Notes (Signed)
BP 127/76 mmHg  Pulse 73  Temp(Src) 97.2 F (36.2 C)  Wt 178 lb (80.74 kg)  SpO2 99%   Subjective:    Patient ID: Felicia Cisneros, female    DOB: 05-Dec-1950, 65 y.o.   MRN: [id number]  HPI: Felicia Cisneros is a 65 y.o. female  Chief Complaint  Patient presents with  . Diabetes    routine follow up and labs, she wants to know if Metformin is the best thing for her to take  . Hypertension    follow up and labs  . Hyperlipidemia    follow up and labs  . Other    She wants to know if it is ok to take Co Q 10 and Red Rice Yeast. She wants to know if she should continue the Aspirin, Ratinidine.  . Immunizations    She is thinking about the shingles vaccine. She is also interested in getting a pneumonia vaccine.   Diabetes; checking sugars, usual numbers are 160 highest, average was 127 High cholesterol; eats 2 eggs a week; does eat some sausage' She is taking statin; asked about CoQ-10 and red yeast rice Still having leg pain; taking magnesium but not helping; it is hard to tell where it hurts; fleeting ghost pain; feet bother really badly All of her toes hurt; discoloration of the left great toenail She is drinking cow's milk She has lost some weight on purpose Blood pressure controlled Thinking about shingles vaccine but she'll get that on Medicare She asked about pneumonia vaccine, but she said she is not interested [employer] would not pay for the cologuard  Relevant past medical, surgical, family and social history reviewed and updated as indicated. Interim medical history since our last visit reviewed. Allergies and medications reviewed and updated.  Review of Systems  Per HPI unless specifically indicated above     Objective:    BP 127/76 mmHg  Pulse 73  Temp(Src) 97.2 F (36.2 C)  Wt 178 lb (80.74 kg)  SpO2 99%  Wt Readings from Last 3 Encounters:  11/27/15 178 lb (80.74 kg)  11/20/15 183 lb 1.6 oz (83.054 kg)  10/09/15 183 lb 14.4 oz (83.416 kg)     Physical Exam  Constitutional: She appears well-developed and well-nourished. No distress.  HENT:  Head: Normocephalic and atraumatic.  Eyes: EOM are normal. No scleral icterus.  Neck: No thyromegaly present.  Cardiovascular: Normal rate, regular rhythm and normal heart sounds.   No murmur heard. Pulmonary/Chest: Effort normal and breath sounds normal. No respiratory distress. She has no wheezes.  Abdominal: Soft. Bowel sounds are normal. She exhibits no distension.  Musculoskeletal: Normal range of motion. She exhibits no edema.  Neurological: She is alert. She exhibits normal muscle tone.  Skin: Skin is warm and dry. She is not diaphoretic. No pallor.  Psychiatric: She has a normal mood and affect. Her behavior is normal. Judgment and thought content normal.    Diabetic Foot Form - Detailed   Diabetic Foot Exam - detailed  Diabetic Foot exam was performed with the following findings:  Yes 11/27/2015 10:44 PM  Visual Foot Exam completed.:  Yes  Are the toenails long?:  No  Are the toenails ingrown?:  No    Pulse Foot Exam completed.:  Yes  Right Dorsalis Pedis:  Present Left Dorsalis Pedis:  Present  Sensory Foot Exam Completed.:  Yes  Swelling:  No  Semmes-Weinstein Monofilament Test  R Site 1-Great Toe:  Pos L Site 1-Great Toe:  Pos  R Site 4:  Pos L Site 4:  Pos  R Site 5:  Pos L Site 5:  Pos    Comments:  Dark focal discoloration of right great toenail        Assessment & Plan:   Problem List Items Addressed This Visit      Cardiovascular and Mediastinum   Essential hypertension    At goal today; continue blood pressure medicine and try DASH guidelines        Endocrine   Type 2 diabetes mellitus, controlled ([place]) - Primary    Eye exam UTD; foot exam by MD today; check sugars 1x a day on average for the first 6 months of diabetes diagnosis      Relevant Medications   Lancets (ACCU-CHEK SOFT TOUCH) lancets   Other Relevant Orders   Hgb A1c w/o eAG (Completed)      Other   Hyperlipidemia    Check lipids today; continue statin; okay for CoQ-10, but I would not recommend red yeast rice; more fruits and veggies      Relevant Orders   Lipid Panel w/o Chol/HDL Ratio (Completed)   Abnormality of nail tissue   Relevant Orders   Ambulatory referral to Dermatology   Need for vaccination against Streptococcus pneumoniae    Next booster in five years (on or after Nov 26, 2020)      Relevant Orders   Pneumococcal polysaccharide vaccine 23-valent greater than or equal to 2yo subcutaneous/IM (Completed)   Medication monitoring encounter   Relevant Orders   Comprehensive metabolic panel (Completed)      Follow up plan: Return in about 3 months (around 02/24/2016) for thirty minute follow-up with fasting labs.  Orders Placed This Encounter  Procedures  . Pneumococcal polysaccharide vaccine 23-valent greater than or equal to 2yo subcutaneous/IM  . Hgb A1c w/o eAG  . Lipid Panel w/o Chol/HDL Ratio  . Comprehensive metabolic panel  . Ambulatory referral to Dermatology   An after-visit summary was printed and given to the patient at [facility].  Please see the patient instructions which may contain other information and recommendations beyond what is mentioned above in the assessment and plan. Meds ordered this encounter  Medications  . Lancets (ACCU-CHEK SOFT TOUCH) lancets    Sig: Use as instructed    Dispense:  100 each    Refill:  12

## 2015-11-27 NOTE — Assessment & Plan Note (Signed)
Eye exam UTD; foot exam by MD today; check sugars 1x a day on average for the first 6 months of diabetes diagnosis

## 2015-11-27 NOTE — Patient Instructions (Addendum)
Okay to drink diet or regular tonic water; 4 ounces every evening We'll have you see Dr. Kellie Moor for the discoloration of your left great toe Do pull out the papers from the diabetic educator Try to limit saturated fats in your diet (bologna, hot dogs, barbeque, cheeseburgers, hamburgers, steak, bacon, sausage, cheese, etc.) and get more fresh fruits, vegetables, and whole grains  Dyslipidemia Dyslipidemia is an imbalance of the lipids in your blood. Lipids are waxy, fat-like proteins that your body needs in small amounts. Dyslipidemia often involves the lipids cholesterol or triglycerides. Common forms of dyslipidemia are:  High levels of bad cholesterol (LDL cholesterol). LDL cholesterol is the type of cholesterol that causes heart disease.  Low levels of good cholesterol (HDL cholesterol). HDL cholesterol is the type of cholesterol that helps protect against heart disease.  High levels of triglycerides. Triglycerides are a fatty substance in the blood linked to a buildup of plaque on your arteries. RISK FACTORS  Increased age.  Having a family history of high cholesterol.  Certain medicines, including birth control pills, diuretics, beta-blockers, and some medicines for depression.  Smoking.  Eating a high-fat diet.  Being overweight.  Medical conditions such as diabetes, polycystic ovary syndrome, pregnancy, kidney disease, and hypothyroidism.  Lack of regular exercise. SIGNS AND SYMPTOMS There are no signs or symptoms with dyslipidemia. DIAGNOSIS A simple blood test called a fasting blood test can be done to determine your level of:  Total cholesterol. This is the combined number of LDL cholesterol and HDL cholesterol. A healthy number is lower than 200.  LDL cholesterol. The goal number for LDL cholesterol is different for each person depending on risk factors. Ask your health care provider what your LDL cholesterol number should be.  HDL cholesterol. A healthy level of  HDL cholesterol is 60 or higher. A number lower than 40 for men or 50 for women is a danger sign.  Triglycerides. A healthy triglyceride number is less than 150. TREATMENT Dyslipidemia is a treatable condition. Your health care provider will advise you on what type of treatment is best based on your age, your test results, and current guidelines. Treatment may include:  Dietary changes. A dietitian may help you create a diet that is based on your risk factors, conditions, and lifestyle.  Regular exercise. This can help lower your LDL cholesterol, raise your HDL cholesterol, and help with weight management. Check with your health care provider before beginning an exercise program. Most people should participate in 30 minutes of brisk exercise 5 days a week.  Quitting smoking.  Medicines to lower LDL cholesterol and triglycerides.  If you have high levels of triglycerides, your health care provider may:  Have you stop drinking alcohol.  Have you restrict your fat intake.  Have you eliminate refined sugars from your diet.  Treat you for other conditions, such as underactive thyroid gland (hypothyroidism) and high blood sugar (hyperglycemia). Your health care provider will monitor your lipid levels with regular blood tests. HOME CARE INSTRUCTIONS  Eat a healthy diet. Follow any diet instructions if they were given to you by your health care provider.  Maintain a healthy weight.  Exercise regularly based on the recommendations of your health care provider.  Do not use any tobacco products, including cigarettes, chewing tobacco, or electronic cigarettes.  Take medicines only as directed by your health care provider.  Keep all follow-up visits as directed by your health care provider. SEEK MEDICAL CARE IF: You are having possible side effects from your  medicines.   This information is not intended to replace advice given to you by your health care provider. Make sure you discuss any  questions you have with your health care provider.   Document Released: 10/16/2013 Document Revised: 11/01/2014 Document Reviewed: 10/16/2013 Elsevier Interactive Patient Education [employer].

## 2015-11-27 NOTE — Assessment & Plan Note (Signed)
At goal today; continue blood pressure medicine and try DASH guidelines

## 2015-11-27 NOTE — Assessment & Plan Note (Signed)
Check lipids today; continue statin; okay for CoQ-10, but I would not recommend red yeast rice; more fruits and veggies

## 2015-11-28 ENCOUNTER — Encounter: Payer: Self-pay | Admitting: Family Medicine

## 2015-11-28 LAB — LIPID PANEL W/O CHOL/HDL RATIO
CHOLESTEROL TOTAL: 143 mg/dL (ref 100–199)
HDL: 48 mg/dL (ref >39)
LDL CALC: 67 mg/dL (ref 0–99)
TRIGLYCERIDES: 138 mg/dL (ref 0–149)
VLDL Cholesterol Cal: 28 mg/dL (ref 5–40)

## 2015-11-28 LAB — HGB A1C W/O EAG: Hgb A1c MFr Bld: 6.1 % — ABNORMAL HIGH (ref 4.8–5.6)

## 2015-11-28 LAB — COMPREHENSIVE METABOLIC PANEL
A/G RATIO: 1.8 (ref 1.1–2.5)
ALBUMIN: 4.4 g/dL (ref 3.6–4.8)
ALK PHOS: 80 IU/L (ref 39–117)
ALT: 25 IU/L (ref 0–32)
AST: 25 IU/L (ref 0–40)
BILIRUBIN TOTAL: 0.5 mg/dL (ref 0.0–1.2)
BUN / CREAT RATIO: 25 (ref 11–26)
BUN: 21 mg/dL (ref 8–27)
CHLORIDE: 103 mmol/L (ref 96–106)
CO2: 19 mmol/L (ref 18–29)
Calcium: 9.8 mg/dL (ref 8.7–10.3)
Creatinine, Ser: 0.85 mg/dL (ref 0.57–1.00)
GFR calc non Af Amer: 73 mL/min/{1.73_m2} (ref >59)
GFR, EST AFRICAN AMERICAN: 84 mL/min/{1.73_m2} (ref >59)
GLUCOSE: 111 mg/dL — AB (ref 65–99)
Globulin, Total: 2.4 g/dL (ref 1.5–4.5)
POTASSIUM: 4.2 mmol/L (ref 3.5–5.2)
Sodium: 144 mmol/L (ref 134–144)
TOTAL PROTEIN: 6.8 g/dL (ref 6.0–8.5)

## 2015-11-28 NOTE — Assessment & Plan Note (Signed)
Next booster in five years (on or after Nov 26, 2020)

## 2015-12-22 ENCOUNTER — Other Ambulatory Visit: Payer: Self-pay | Admitting: Family Medicine

## 2016-02-23 ENCOUNTER — Other Ambulatory Visit: Payer: Self-pay | Admitting: Family Medicine

## 2016-02-23 NOTE — Telephone Encounter (Signed)
Your patient, does not appear to be changing

## 2016-02-23 NOTE — Telephone Encounter (Signed)
Reviewed labs from Feb 2017; Rx approved

## 2016-02-26 DIAGNOSIS — L570 Actinic keratosis: Secondary | ICD-10-CM | POA: Diagnosis not present

## 2016-02-27 ENCOUNTER — Ambulatory Visit (INDEPENDENT_AMBULATORY_CARE_PROVIDER_SITE_OTHER): Payer: BLUE CROSS/BLUE SHIELD | Admitting: Family Medicine

## 2016-02-27 ENCOUNTER — Encounter: Payer: Self-pay | Admitting: Family Medicine

## 2016-02-27 VITALS — BP 122/72 | HR 87 | Temp 97.7°F | Resp 14 | Wt 173.0 lb

## 2016-02-27 DIAGNOSIS — Z13820 Encounter for screening for osteoporosis: Secondary | ICD-10-CM

## 2016-02-27 DIAGNOSIS — I10 Essential (primary) hypertension: Secondary | ICD-10-CM | POA: Diagnosis not present

## 2016-02-27 DIAGNOSIS — E785 Hyperlipidemia, unspecified: Secondary | ICD-10-CM

## 2016-02-27 DIAGNOSIS — Z23 Encounter for immunization: Secondary | ICD-10-CM | POA: Diagnosis not present

## 2016-02-27 DIAGNOSIS — E119 Type 2 diabetes mellitus without complications: Secondary | ICD-10-CM | POA: Diagnosis not present

## 2016-02-27 MED ORDER — METFORMIN HCL ER 500 MG PO TB24: 500.0000 mg | ORAL_TABLET | Freq: Every day | ORAL | Status: DC

## 2016-02-27 MED ORDER — HYDROCHLOROTHIAZIDE 12.5 MG PO CAPS: 12.5000 mg | ORAL_CAPSULE | Freq: Every day | ORAL | Status: DC

## 2016-02-27 MED ORDER — SIMVASTATIN 10 MG PO TABS: 10.0000 mg | ORAL_TABLET | Freq: Every day | ORAL | Status: DC

## 2016-02-27 NOTE — Progress Notes (Signed)
BP 122/72 mmHg  Pulse 87  Temp(Src) 97.7 F (36.5 C) (Oral)  Resp 14  Wt 173 lb (78.472 kg)  SpO2 96%   Subjective:    Patient ID: Felicia Cisneros, female    DOB: [birth year], 65 y.o.   MRN: [id number]  HPI: Felicia Cisneros is a 65 y.o. female  Chief Complaint  Patient presents with  . Follow-up  . Labs Only   Diabetes; not checking sugars daily, every other day; 120s; on metformin No sores or numbness on the feet; used to hurt but not any more; went to dermatologist for nail color changes, kicked something and growing out; no fungus; she did the diabetic education classes and she had to pay $1200 with [employer]  High cholesterol; she asked how to get an accurate reading if taking medicine; taking simvastatin; no aches  Her insurance would not cover Cologuard; already did it and it was fine  HTN; well-controlled; HCTZ; no problems  Depression screen [facility] 2/9 02/27/2016 09/03/2015 08/21/2015  Decreased Interest 0 1 3  Down, Depressed, Hopeless 0 0 2  PHQ - 2 Score 0 1 5  Altered sleeping - 3 3  Tired, decreased energy - 0 0  Change in appetite - 0 0  Feeling bad or failure about yourself  - 0 0  Trouble concentrating - 0 0  Moving slowly or fidgety/restless - 0 0  Suicidal thoughts - 0 0  PHQ-9 Score - 4 8  Difficult doing work/chores - Not difficult at all Not difficult at all    GAD 7 : Generalized Anxiety Score 08/21/2015  Nervous, Anxious, on Edge 0  Control/stop worrying 1  Worry too much - different things 1  Trouble relaxing 1  Restless 0  Easily annoyed or irritable 0  Afraid - awful might happen 3  Total GAD 7 Score 6  Anxiety Difficulty Not difficult at all   Relevant past medical, surgical, family and social history reviewed Past Medical History  Diagnosis Date  . Leukorrhea, not specified as infective   . Heartburn   . Hyperlipidemia   . Unspecified essential hypertension   . IFG (impaired fasting glucose)   . Atrophic vaginitis   . History of  tobacco use   . Personal history of tobacco use, presenting hazards to health 08/20/2015  . Diabetes mellitus without complication [facility])    Past Surgical History  Procedure Laterality Date  . Colonoscopy    . Tmj arthroplasty  1984   Family History  Problem Relation Age of Onset  . Stroke Mother   . Hypertension Mother   . Hyperlipidemia Mother   . Diabetes Mother     pre-diabetic  . Thyroid disease Sister   . Diabetes Brother   . Irregular heart beat Brother   . Stroke Maternal Grandmother    Social History  Substance Use Topics  . Smoking status: Former Smoker -- 3.00 packs/day for 45 years    Types: Cigarettes    Quit date: 10/25/2009  . Smokeless tobacco: Never Used  . Alcohol Use: 0.0 oz/week    0 Glasses of wine per week     Comment: Rarely  widowed; husband Buddy passed away a year ago this week  Interim medical history since last visit reviewed; saw dermatologist yesterday; has precancerous cells and is going to do the cream for the face Allergies and medications reviewed  Review of Systems Per HPI unless specifically indicated above She is losing weight on purpose; [facility]     Objective:    BP 122/72 mmHg  Pulse 87  Temp(Src) 97.7 F (36.5 C) (Oral)  Resp 14  Wt 173 lb (78.472 kg)  SpO2 96%  Wt Readings from Last 3 Encounters:  02/27/16 173 lb (78.472 kg)  11/27/15 178 lb (80.74 kg)  11/20/15 183 lb 1.6 oz (83.054 kg)    Physical Exam  Constitutional: She appears well-developed and well-nourished. No distress.  Weight down five pounds in 3 months  HENT:  Head: Normocephalic and atraumatic.  Eyes: EOM are normal. No scleral icterus.  Neck: No thyromegaly present.  Cardiovascular: Normal rate, regular rhythm and normal heart sounds.   No murmur heard. Pulmonary/Chest: Effort normal and breath sounds normal. No respiratory distress. She has no wheezes.  Abdominal: Soft. Bowel sounds are normal. She exhibits no  distension.  Musculoskeletal: Normal range of motion. She exhibits no edema.  Neurological: She is alert. She exhibits normal muscle tone.  Skin: Skin is warm and dry. She is not diaphoretic. No pallor.  Psychiatric: She has a normal mood and affect. Her behavior is normal. Judgment and thought content normal.   Diabetic Foot Form - Detailed   Diabetic Foot Exam - detailed  Diabetic Foot exam was performed with the following findings:  Yes 02/27/2016  8:48 AM  Visual Foot Exam completed.:  Yes  Are the toenails long?:  No  Are the toenails thick?:  No  Are the toenails ingrown?:  No  Normal Range of Motion:  Yes    Pulse Foot Exam completed.:  Yes  Right Dorsalis Pedis:  Present Left Dorsalis Pedis:  Present  Sensory Foot Exam Completed.:  Yes  Swelling:  No  Semmes-Weinstein Monofilament Test  R Site 1-Great Toe:  Pos L Site 1-Great Toe:  Pos  R Site 4:  Pos L Site 4:  Pos  R Site 5:  Pos L Site 5:  Pos       Results for orders placed or performed in visit on 11/27/15  HM DIABETES EYE EXAM  Result Value Ref Range   HM Diabetic Eye Exam No Retinopathy No Retinopathy      Assessment & Plan:   Problem List Items Addressed This Visit      Cardiovascular and Mediastinum   Essential hypertension    Controlled today; DASH guidelines      Relevant Medications   hydrochlorothiazide (MICROZIDE) 12.5 MG capsule   simvastatin (ZOCOR) 10 MG tablet     Endocrine   Type 2 diabetes mellitus, controlled ([facility]egino Ramirez) - Primary    Foot exam today by MD; aspirin, healthy eating, check feet nightly; eye exams yearly; A1c every 6 months if under 7, next due in early August; urine microalbumin:Cr ordered today      Relevant Medications   metFORMIN (GLUCOPHAGE-XR) 500 MG 24 hr tablet   simvastatin (ZOCOR) 10 MG tablet   Other Relevant Orders   Microalbumin / creatinine urine ratio     Other   Hyperlipidemia    Check lipids every 6 months; last LDL, TG and HDL reviewed; limit fats, continue  statin      Relevant Medications   hydrochlorothiazide (MICROZIDE) 12.5 MG capsule   simvastatin (ZOCOR) 10 MG tablet   Screening for osteoporosis    DEXA scan ordered today      Relevant Orders   DG Bone Density    Other Visit Diagnoses    Need for zoster vaccination        shingles  vaccine given today; will not need another in her lifetime per current ACIP guidelines    Relevant Orders    Varicella-zoster vaccine subcutaneous (Completed)        Follow up plan: Return in about 3 months (around 05/27/2016), or or shortly thereafter, for fasting labs and visit; Welcome to Medicare visit in 2 months.  An after-visit summary was printed and given to the patient at [place].  Please see the patient instructions which may contain other information and recommendations beyond what is mentioned above in the assessment and plan.  Meds ordered this encounter  Medications  . Coenzyme Q10 (CO Q 10) 10 MG CAPS    Sig: Take 400 mg by mouth daily.  . Turmeric 500 MG CAPS    Sig: Take 300 mg by mouth.  . hydrochlorothiazide (MICROZIDE) 12.5 MG capsule    Sig: Take 1 capsule (12.5 mg total) by mouth daily.    Dispense:  30 capsule    Refill:  6  . metFORMIN (GLUCOPHAGE-XR) 500 MG 24 hr tablet    Sig: Take 1 tablet (500 mg total) by mouth daily with breakfast.    Dispense:  30 tablet    Refill:  6  . simvastatin (ZOCOR) 10 MG tablet    Sig: Take 1 tablet (10 mg total) by mouth at bedtime.    Dispense:  30 tablet    Refill:  6   Orders Placed This Encounter  Procedures  . DG Bone Density  . Varicella-zoster vaccine subcutaneous  . Microalbumin / creatinine urine ratio

## 2016-02-27 NOTE — Assessment & Plan Note (Signed)
Check lipids every 6 months; last LDL, TG and HDL reviewed; limit fats, continue statin

## 2016-02-27 NOTE — Assessment & Plan Note (Addendum)
Foot exam today by MD; aspirin, healthy eating, check feet nightly; eye exams yearly; A1c every 6 months if under 7, next due in early August; urine microalbumin:Cr ordered today

## 2016-02-27 NOTE — Assessment & Plan Note (Signed)
DEXA scan ordered today 

## 2016-02-27 NOTE — Patient Instructions (Addendum)
Your next A1c and other fasting labs will be due on or after May 27, 2016  Have a urine collected at [place] in this building today  Please do see your eye doctor regularly, and have your eyes examined every year (or more often per his or her recommendation) Check your feet every night and let me know right away of any sores, infections, numbness, etc. Try to limit sweets, white bread, white rice, white potatoes It is okay with me for you to not check your fingerstick blood sugars (per [employer] of Endocrinology Best Practices), unless you are interested and feel it would be helpful for you  Please do call to schedule your bone density study; the number to schedule one at either [facility] or [facility] is [phone]  You received the shingles shot today and will not need a booster for the rest of your natural days  Return for a Welcome to Medicare Visit in about 2 months

## 2016-02-27 NOTE — Assessment & Plan Note (Signed)
Controlled today; DASH guidelines

## 2016-02-28 LAB — MICROALBUMIN / CREATININE URINE RATIO
CREATININE, UR: 173 mg/dL
MICROALB/CREAT RATIO: 8.9 mg/g{creat} (ref 0.0–30.0)
MICROALBUM., U, RANDOM: 15.4 ug/mL

## 2016-04-01 ENCOUNTER — Other Ambulatory Visit: Payer: Self-pay | Admitting: Family Medicine

## 2016-04-01 ENCOUNTER — Encounter: Payer: Self-pay | Admitting: Family Medicine

## 2016-04-01 DIAGNOSIS — Z23 Encounter for immunization: Secondary | ICD-10-CM | POA: Insufficient documentation

## 2016-04-01 MED ORDER — ZOSTER VACCINE LIVE 19400 UNT/0.65ML ~~LOC~~ SUSR: 0.6500 mL | Freq: Once | SUBCUTANEOUS | Status: DC

## 2016-04-15 DIAGNOSIS — H25013 Cortical age-related cataract, bilateral: Secondary | ICD-10-CM | POA: Diagnosis not present

## 2016-04-23 ENCOUNTER — Other Ambulatory Visit: Payer: Self-pay | Admitting: Family Medicine

## 2016-04-26 ENCOUNTER — Ambulatory Visit (INDEPENDENT_AMBULATORY_CARE_PROVIDER_SITE_OTHER): Payer: Medicare Other | Admitting: Family Medicine

## 2016-04-26 ENCOUNTER — Encounter: Payer: Self-pay | Admitting: Family Medicine

## 2016-04-26 VITALS — BP 122/68 | HR 86 | Temp 98.6°F | Resp 16 | Ht 68.5 in | Wt 172.4 lb

## 2016-04-26 DIAGNOSIS — Z1239 Encounter for other screening for malignant neoplasm of breast: Secondary | ICD-10-CM

## 2016-04-26 DIAGNOSIS — Z0000 Encounter for general adult medical examination without abnormal findings: Secondary | ICD-10-CM

## 2016-04-26 DIAGNOSIS — Z13820 Encounter for screening for osteoporosis: Secondary | ICD-10-CM | POA: Diagnosis not present

## 2016-04-26 NOTE — Patient Instructions (Addendum)
Please do call to schedule your bone density study; the number to schedule one at either [facility] or [facility] is [phone] Please do call to schedule your mammogram; the number to schedule one at either [facility] or [facility] is [phone]  Decrease your vitamin D3 to just 5,000 iu twice a week  Be safe and smart if dating; always be in public places  Do make an appointment with Dr. Mariah Milling now that you have diabetes (because that is the equivalent of having heart disease or coronary artery disease); he may decide if it's time to do another stress test  Health Maintenance  Topic Date Due  . DEXA SCAN  02/10/2016  . INFLUENZA VACCINE  05/25/2016  . HEMOGLOBIN A1C  05/26/2016  . MAMMOGRAM  07/09/2016  . OPHTHALMOLOGY EXAM  11/17/2016  . PNA vac Low Risk Adult (1 of 2 - PCV13) 11/26/2016  . FOOT EXAM  02/26/2017  . URINE MICROALBUMIN  02/26/2017  . COLONOSCOPY  10/05/2017  . PAP SMEAR  08/20/2018  . TETANUS/TDAP  10/25/2018  . ZOSTAVAX  Completed  . Hepatitis C Screening  Completed  . HIV Screening  Completed   Eye exam up-to-date per your report  Health Maintenance, Female Adopting a healthy lifestyle and getting preventive care can go a long way to promote health and wellness. Talk with your health care provider about what schedule of regular examinations is right for you. This is a good chance for you to check in with your provider about disease prevention and staying healthy. In between checkups, there are plenty of things you can do on your own. Experts have done a lot of research about which lifestyle changes and preventive measures are most likely to keep you healthy. Ask your health care provider for more information. WEIGHT AND DIET  Eat a healthy diet  Be sure to include plenty of vegetables, fruits, low-fat dairy products, and lean protein.  Do not eat a lot of foods high in solid fats, added sugars,  or salt.  Get regular exercise. This is one of the most important things you can do for your health.  Most adults should exercise for at least 150 minutes each week. The exercise should increase your heart rate and make you sweat (moderate-intensity exercise).  Most adults should also do strengthening exercises at least twice a week. This is in addition to the moderate-intensity exercise.  Maintain a healthy weight  Body mass index (BMI) is a measurement that can be used to identify possible weight problems. It estimates body fat based on height and weight. Your health care provider can help determine your BMI and help you achieve or maintain a healthy weight.  For females 56 years of age and older:   A BMI below 18.5 is considered underweight.  A BMI of 18.5 to 24.9 is normal.  A BMI of 25 to 29.9 is considered overweight.  A BMI of 30 and above is considered obese.  Watch levels of cholesterol and blood lipids  You should start having your blood tested for lipids and cholesterol at 65 years of age, then have this test every 5 years.  You may need to have your cholesterol levels checked more often if:  Your lipid or cholesterol levels are high.  You are older than 65 years of age.  You are at high risk for heart disease.  CANCER SCREENING   Lung Cancer  Lung cancer screening is recommended for adults  33-18 years old who are at high risk for lung cancer because of a history of smoking.  A yearly low-dose CT scan of the lungs is recommended for people who:  Currently smoke.  Have quit within the past 15 years.  Have at least a 30-pack-year history of smoking. A pack year is smoking an average of one pack of cigarettes a day for 1 year.  Yearly screening should continue until it has been 15 years since you quit.  Yearly screening should stop if you develop a health problem that would prevent you from having lung cancer treatment.  Breast Cancer  Practice breast  self-awareness. This means understanding how your breasts normally appear and feel.  It also means doing regular breast self-exams. Let your health care provider know about any changes, no matter how small.  If you are in your 20s or 30s, you should have a clinical breast exam (CBE) by a health care provider every 1-3 years as part of a regular health exam.  If you are 38 or older, have a CBE every year. Also consider having a breast X-ray (mammogram) every year.  If you have a family history of breast cancer, talk to your health care provider about genetic screening.  If you are at high risk for breast cancer, talk to your health care provider about having an MRI and a mammogram every year.  Breast cancer gene (BRCA) assessment is recommended for women who have family members with BRCA-related cancers. BRCA-related cancers include:  Breast.  Ovarian.  Tubal.  Peritoneal cancers.  Results of the assessment will determine the need for genetic counseling and BRCA1 and BRCA2 testing. Cervical Cancer Your health care provider may recommend that you be screened regularly for cancer of the pelvic organs (ovaries, uterus, and vagina). This screening involves a pelvic examination, including checking for microscopic changes to the surface of your cervix (Pap test). You may be encouraged to have this screening done every 3 years, beginning at age 16.  For women ages 99-65, health care providers may recommend pelvic exams and Pap testing every 3 years, or they may recommend the Pap and pelvic exam, combined with testing for human papilloma virus (HPV), every 5 years. Some types of HPV increase your risk of cervical cancer. Testing for HPV may also be done on women of any age with unclear Pap test results.  Other health care providers may not recommend any screening for nonpregnant women who are considered low risk for pelvic cancer and who do not have symptoms. Ask your health care provider if a  screening pelvic exam is right for you.  If you have had past treatment for cervical cancer or a condition that could lead to cancer, you need Pap tests and screening for cancer for at least 20 years after your treatment. If Pap tests have been discontinued, your risk factors (such as having a new sexual partner) need to be reassessed to determine if screening should resume. Some women have medical problems that increase the chance of getting cervical cancer. In these cases, your health care provider may recommend more frequent screening and Pap tests. Colorectal Cancer  This type of cancer can be detected and often prevented.  Routine colorectal cancer screening usually begins at 65 years of age and continues through 65 years of age.  Your health care provider may recommend screening at an earlier age if you have risk factors for colon cancer.  Your health care provider may also recommend using home test  kits to check for hidden blood in the stool.  A small camera at the end of a tube can be used to examine your colon directly (sigmoidoscopy or colonoscopy). This is done to check for the earliest forms of colorectal cancer.  Routine screening usually begins at age 74.  Direct examination of the colon should be repeated every 5-10 years through 65 years of age. However, you may need to be screened more often if early forms of precancerous polyps or small growths are found. Skin Cancer  Check your skin from head to toe regularly.  Tell your health care provider about any new moles or changes in moles, especially if there is a change in a mole's shape or color.  Also tell your health care provider if you have a mole that is larger than the size of a pencil eraser.  Always use sunscreen. Apply sunscreen liberally and repeatedly throughout the day.  Protect yourself by wearing long sleeves, pants, a wide-brimmed hat, and sunglasses whenever you are outside. HEART DISEASE, DIABETES, AND HIGH  BLOOD PRESSURE   High blood pressure causes heart disease and increases the risk of stroke. High blood pressure is more likely to develop in:  People who have blood pressure in the high end of the normal range (130-139/85-89 mm Hg).  People who are overweight or obese.  People who are African American.  If you are 22-76 years of age, have your blood pressure checked every 3-5 years. If you are 61 years of age or older, have your blood pressure checked every year. You should have your blood pressure measured twice--once when you are at a hospital or clinic, and once when you are not at a hospital or clinic. Record the average of the two measurements. To check your blood pressure when you are not at a hospital or clinic, you can use:  An automated blood pressure machine at a pharmacy.  A home blood pressure monitor.  If you are between 64 years and 21 years old, ask your health care provider if you should take aspirin to prevent strokes.  Have regular diabetes screenings. This involves taking a blood sample to check your fasting blood sugar level.  If you are at a normal weight and have a low risk for diabetes, have this test once every three years after 65 years of age.  If you are overweight and have a high risk for diabetes, consider being tested at a younger age or more often. PREVENTING INFECTION  Hepatitis B  If you have a higher risk for hepatitis B, you should be screened for this virus. You are considered at high risk for hepatitis B if:  You were born in a country where hepatitis B is common. Ask your health care provider which countries are considered high risk.  Your parents were born in a high-risk country, and you have not been immunized against hepatitis B (hepatitis B vaccine).  You have HIV or AIDS.  You use needles to inject street drugs.  You live with someone who has hepatitis B.  You have had sex with someone who has hepatitis B.  You get hemodialysis  treatment.  You take certain medicines for conditions, including cancer, organ transplantation, and autoimmune conditions. Hepatitis C  Blood testing is recommended for:  Everyone born from [birth year] through 1965.  Anyone with known risk factors for hepatitis C. Sexually transmitted infections (STIs)  You should be screened for sexually transmitted infections (STIs) including gonorrhea and chlamydia if:  You  are sexually active and are younger than 65 years of age.  You are older than 65 years of age and your health care provider tells you that you are at risk for this type of infection.  Your sexual activity has changed since you were last screened and you are at an increased risk for chlamydia or gonorrhea. Ask your health care provider if you are at risk.  If you do not have HIV, but are at risk, it may be recommended that you take a prescription medicine daily to prevent HIV infection. This is called pre-exposure prophylaxis (PrEP). You are considered at risk if:  You are sexually active and do not regularly use condoms or know the HIV status of your partner(s).  You take drugs by injection.  You are sexually active with a partner who has HIV. Talk with your health care provider about whether you are at high risk of being infected with HIV. If you choose to begin PrEP, you should first be tested for HIV. You should then be tested every 3 months for as long as you are taking PrEP.  PREGNANCY   If you are premenopausal and you may become pregnant, ask your health care provider about preconception counseling.  If you may become pregnant, take 400 to 800 micrograms (mcg) of folic acid every day.  If you want to prevent pregnancy, talk to your health care provider about birth control (contraception). OSTEOPOROSIS AND MENOPAUSE   Osteoporosis is a disease in which the bones lose minerals and strength with aging. This can result in serious bone fractures. Your risk for osteoporosis can  be identified using a bone density scan.  If you are 5 years of age or older, or if you are at risk for osteoporosis and fractures, ask your health care provider if you should be screened.  Ask your health care provider whether you should take a calcium or vitamin D supplement to lower your risk for osteoporosis.  Menopause may have certain physical symptoms and risks.  Hormone replacement therapy may reduce some of these symptoms and risks. Talk to your health care provider about whether hormone replacement therapy is right for you.  HOME CARE INSTRUCTIONS   Schedule regular health, dental, and eye exams.  Stay current with your immunizations.   Do not use any tobacco products including cigarettes, chewing tobacco, or electronic cigarettes.  If you are pregnant, do not drink alcohol.  If you are breastfeeding, limit how much and how often you drink alcohol.  Limit alcohol intake to no more than 1 drink per day for nonpregnant women. One drink equals 12 ounces of beer, 5 ounces of wine, or 1 ounces of hard liquor.  Do not use street drugs.  Do not share needles.  Ask your health care provider for help if you need support or information about quitting drugs.  Tell your health care provider if you often feel depressed.  Tell your health care provider if you have ever been abused or do not feel safe at home.   This information is not intended to replace advice given to you by your health care provider. Make sure you discuss any questions you have with your health care provider.   Document Released: 04/26/2011 Document Revised: 11/01/2014 Document Reviewed: 09/12/2013 Elsevier Interactive Patient Education [employer].

## 2016-04-26 NOTE — Progress Notes (Signed)
Patient: Felicia Cisneros, Female    DOB: June 27, 1951, 65 y.o.   MRN: [id number]  Visit Date: 04/26/2016  Today's Provider: Enid Derry, MD   Chief Complaint  Patient presents with  . Medicare Wellness    patient is here for the welcome to medicare   . Labs Only    patient is fasting in case labs are needed  . Orders    dexa   Subjective:   Felicia Cisneros is a 65 y.o. female who presents today for her Subsequent Annual Wellness Visit.  Caregiver input:  N/a  She was unable to have intercourse for years; too painful; thinking about finding a date; she doesn't want to take estrogen  USPSTF grade A and B recommendations Alcohol: rare social Depression:  Depression screen [facility] 2/9 04/26/2016 02/27/2016 09/03/2015 08/21/2015  Decreased Interest 0 0 1 3  Down, Depressed, Hopeless 0 0 0 2  PHQ - 2 Score 0 0 1 5  Altered sleeping - - 3 3  Tired, decreased energy - - 0 0  Change in appetite - - 0 0  Feeling bad or failure about yourself  - - 0 0  Trouble concentrating - - 0 0  Moving slowly or fidgety/restless - - 0 0  Suicidal thoughts - - 0 0  PHQ-9 Score - - 4 8  Difficult doing work/chores - - Not difficult at all Not difficult at all    Hypertension: controlled Obesity: controlled Tobacco use: quit 4-5 years HIV, hep C: both done in October 2016 STD testing and prevention (chl/gon/syphilis): declined Lipids: Feb 2017  Glucose: Feb 2017; checked Friday, 128; usually running 109-110; not using every day Colorectal cancer: Dec 2016; next due in 2018 Breast cancer: last done 2015 Cervical cancer screening: UTD, 2016 Lung cancer: she is doing a study with low dose chest CT with the cancer center Osteoporosis: ordered Fall prevention/vitamin D: discussed fall precautions, taking vit D3 5,000 iu daily (immediately told her to back that down) AAA: no one in the family Aspirin: taking Diet: pretty good Exercise: pretty active, goes to senior center 2x a week, fit and tone Skin  cancer: goes once a year to Dr. Kellie Moor  HPI  Review of Systems  Past Medical History  Diagnosis Date  . Leukorrhea, not specified as infective   . Heartburn   . Hyperlipidemia   . Unspecified essential hypertension   . IFG (impaired fasting glucose)   . Atrophic vaginitis   . History of tobacco use   . Personal history of tobacco use, presenting hazards to health 08/20/2015  . Diabetes mellitus without complication [facility])    Past Surgical History  Procedure Laterality Date  . Colonoscopy    . Tmj arthroplasty  1984   Family History  Problem Relation Age of Onset  . Stroke Mother   . Hypertension Mother   . Hyperlipidemia Mother   . Diabetes Mother     pre-diabetic  . Thyroid disease Sister   . Diabetes Brother   . Irregular heart beat Brother   . Stroke Maternal Grandmother    Social History   Social History  . Marital Status: Married    Spouse Name: N/A  . Number of Children: N/A  . Years of Education: N/A   Occupational History  . Not on file.   Social History Main Topics  . Smoking status: Former Smoker -- 3.00 packs/day for 45 years    Types: Cigarettes    Quit date: 10/25/2009  . Smokeless  tobacco: Never Used  . Alcohol Use: 0.0 oz/week    0 Glasses of wine per week     Comment: Rarely  . Drug Use: No  . Sexual Activity: Not Currently   Other Topics Concern  . Not on file   Social History Narrative    Outpatient Encounter Prescriptions as of 04/26/2016  Medication Sig  . aspirin 81 MG tablet Take 81 mg by mouth daily.  . Cholecalciferol (VITAMIN D3) 5000 UNITS TABS Take 1 tablet by mouth daily.  Marland Kitchen CINNAMON PO Take 200 mg by mouth 3 (three) times daily.   . Coenzyme Q10 (CO Q 10) 10 MG CAPS Take 400 mg by mouth daily.  . hydrochlorothiazide (MICROZIDE) 12.5 MG capsule Take 1 capsule (12.5 mg total) by mouth daily.  . Lancets (ACCU-CHEK SOFT TOUCH) lancets Use as instructed  . metFORMIN (GLUCOPHAGE-XR) 500 MG 24 hr tablet Take 1 tablet (500 mg  total) by mouth daily with breakfast.  . Multiple Vitamins-Minerals (CENTRUM SILVER ADULT 50+ PO) Take 1 tablet by mouth daily.   Glory Rosebush VERIO test strip CHECK FINGERSTICK BLOOD SUGARS ONCE A DAY  . Probiotic Product (PROBIOTIC PO) Take 1 tablet by mouth daily.  . ranitidine (ZANTAC) 300 MG tablet Take 150 mg by mouth 2 (two) times daily.   . simvastatin (ZOCOR) 10 MG tablet Take 1 tablet (10 mg total) by mouth at bedtime.  . Turmeric 500 MG CAPS Take 300 mg by mouth.  . Zoster Vaccine Live, PF, (ZOSTAVAX) 16109 UNT/0.65ML injection Inject 19,400 Units into the skin once.   No facility-administered encounter medications on file as of 04/26/2016.    Functional Ability / Safety Screening 1.  Was the timed Get Up and Go test longer than 30 seconds?  no 2.  Does the patient need help with the phone, transportation, shopping,      preparing meals, housework, laundry, medications, or managing money?  no 3.  Does the patient's home have:  loose throw rugs in the hallway?   yes, has rubber thing under, nonslip surface underneath      Grab bars in the bathroom? yes      Handrails on the stairs?   yes      Poor lighting?   yes; old log house; think about electrician come out 4.  Has the patient noticed any hearing difficulties?   yes; wears hearing aides  Fall Risk Assessment See under rooming  Depression Screen See under rooming Depression screen [facility] 2/9 04/26/2016 02/27/2016 09/03/2015 08/21/2015  Decreased Interest 0 0 1 3  Down, Depressed, Hopeless 0 0 0 2  PHQ - 2 Score 0 0 1 5  Altered sleeping - - 3 3  Tired, decreased energy - - 0 0  Change in appetite - - 0 0  Feeling bad or failure about yourself  - - 0 0  Trouble concentrating - - 0 0  Moving slowly or fidgety/restless - - 0 0  Suicidal thoughts - - 0 0  PHQ-9 Score - - 4 8  Difficult doing work/chores - - Not difficult at all Not difficult at all   Advanced Directives Does patient have a HCPOA?    no If yes, name and contact  information: she'll check into that, update after Buddy's death Does patient have a living will or MOST form?  yes  Objective:   Vitals: BP 122/68 mmHg  Pulse 86  Temp(Src) 98.6 F (37 C) (Oral)  Resp 16  Ht 5' 8.5" (1.74 m)  Wt 172 lb 6.4 oz (78.2 kg)  BMI 25.83 kg/m2  SpO2 96% Body mass index is 25.83 kg/(m^2). No exam data present  Physical Exam Mood/affect:   Appearance:    Cognitive Testing - 6-CIT  Correct? Score   What year is it? yes 0 Yes = 0    No = 4  What month is it? yes 0 Yes = 0    No = 3  Remember:     Pia [place], [place], [place]     What time is it? yes 0 Yes = 0    No = 3  Count backwards from 20 to 1 yes 0 Correct = 0    1 error = 2   More than 1 error = 4  Say the months of the year in reverse. yes 0 Correct = 0    1 error = 2   More than 1 error = 4  What address did I ask you to remember? yes 0 Correct = 0  1 error = 2    2 error = 4    3 error = 6    4 error = 8    All wrong = 10       TOTAL SCORE  0/28   Interpretation:  Normal  Normal (0-7) Abnormal (8-28)    Assessment & Plan:     Annual Wellness Visit  Reviewed patient's Family Medical History Reviewed and updated list of patient's medical providers Assessment of cognitive impairment was done Assessed patient's functional ability Established a written schedule for health screening [place] Completed and Reviewed  Exercise Activities and Dietary recommendations Goals    None    continue to stay active; keep doing what she's doing  Immunization History  Administered Date(s) Administered  . Influenza,inj,Quad PF,36+ Mos 08/22/2015  . Pneumococcal Polysaccharide-23 11/27/2015  . Tdap 10/25/2008  . Zoster 02/27/2016    Health Maintenance  Topic Date Due  . DEXA SCAN  02/10/2016  . INFLUENZA VACCINE  05/25/2016  . HEMOGLOBIN A1C  05/26/2016  . MAMMOGRAM  07/09/2016  . OPHTHALMOLOGY EXAM  11/17/2016  . PNA vac Low Risk Adult (1 of 2 - PCV13)  11/26/2016  . FOOT EXAM  02/26/2017  . URINE MICROALBUMIN  02/26/2017  . COLONOSCOPY  10/05/2017  . PAP SMEAR  08/20/2018  . TETANUS/TDAP  10/25/2018  . ZOSTAVAX  Completed  . Hepatitis C Screening  Completed  . HIV Screening  Completed     Discussed health benefits of physical activity, and encouraged her to engage in regular exercise appropriate for her age and condition.    Current outpatient prescriptions:  .  aspirin 81 MG tablet, Take 81 mg by mouth daily., Disp: , Rfl:  .  Cholecalciferol (VITAMIN D3) 5000 UNITS TABS, Take 1 tablet by mouth daily., Disp: , Rfl:  .  CINNAMON PO, Take 200 mg by mouth 3 (three) times daily. , Disp: , Rfl:  .  Coenzyme Q10 (CO Q 10) 10 MG CAPS, Take 400 mg by mouth daily., Disp: , Rfl:  .  hydrochlorothiazide (MICROZIDE) 12.5 MG capsule, Take 1 capsule (12.5 mg total) by mouth daily., Disp: 30 capsule, Rfl: 6 .  Lancets (ACCU-CHEK SOFT TOUCH) lancets, Use as instructed, Disp: 100 each, Rfl: 12 .  metFORMIN (GLUCOPHAGE-XR) 500 MG 24 hr tablet, Take 1 tablet (500 mg total) by mouth daily with breakfast., Disp: 30 tablet, Rfl: 6 .  Multiple Vitamins-Minerals (CENTRUM SILVER ADULT 50+ PO),  Take 1 tablet by mouth daily. , Disp: , Rfl:  .  ONETOUCH VERIO test strip, CHECK FINGERSTICK BLOOD SUGARS ONCE A DAY, Disp: 100 each, Rfl: 1 .  Probiotic Product (PROBIOTIC PO), Take 1 tablet by mouth daily., Disp: , Rfl:  .  ranitidine (ZANTAC) 300 MG tablet, Take 150 mg by mouth 2 (two) times daily. , Disp: , Rfl:  .  simvastatin (ZOCOR) 10 MG tablet, Take 1 tablet (10 mg total) by mouth at bedtime., Disp: 30 tablet, Rfl: 6 .  Turmeric 500 MG CAPS, Take 300 mg by mouth., Disp: , Rfl:  .  Zoster Vaccine Live, PF, (ZOSTAVAX) 57846 UNT/0.65ML injection, Inject 19,400 Units into the skin once., Disp: 1 each, Rfl: 0 There are no discontinued medications.  Next Medicare Wellness Visit in 12+ months

## 2016-04-26 NOTE — Assessment & Plan Note (Signed)
USPSTF grade A and B recommendations reviewed with patient; age-appropriate recommendations, preventive care, screening tests, etc discussed and encouraged; healthy living encouraged; see AVS for patient education given to patient  

## 2016-04-26 NOTE — Assessment & Plan Note (Signed)
Order DEXA 

## 2016-05-27 ENCOUNTER — Ambulatory Visit
Admission: RE | Admit: 2016-05-27 | Discharge: 2016-05-27 | Disposition: A | Discharge status: Home / Self Care | Payer: Medicare Other | Source: Ambulatory Visit | Attending: Family Medicine | Admitting: Family Medicine

## 2016-05-27 ENCOUNTER — Encounter: Payer: Self-pay | Admitting: Family Medicine

## 2016-05-27 DIAGNOSIS — Z8262 Family history of osteoporosis: Secondary | ICD-10-CM | POA: Diagnosis not present

## 2016-05-27 DIAGNOSIS — M8580 Other specified disorders of bone density and structure, unspecified site: Secondary | ICD-10-CM | POA: Insufficient documentation

## 2016-05-27 DIAGNOSIS — M8588 Other specified disorders of bone density and structure, other site: Secondary | ICD-10-CM | POA: Diagnosis not present

## 2016-05-27 DIAGNOSIS — M85852 Other specified disorders of bone density and structure, left thigh: Secondary | ICD-10-CM | POA: Diagnosis not present

## 2016-05-27 DIAGNOSIS — Z780 Asymptomatic menopausal state: Secondary | ICD-10-CM | POA: Insufficient documentation

## 2016-05-27 DIAGNOSIS — Z1231 Encounter for screening mammogram for malignant neoplasm of breast: Secondary | ICD-10-CM | POA: Insufficient documentation

## 2016-05-27 DIAGNOSIS — Z13820 Encounter for screening for osteoporosis: Secondary | ICD-10-CM | POA: Diagnosis not present

## 2016-05-27 HISTORY — DX: Other specified disorders of bone density and structure, unspecified site: M85.80

## 2016-05-31 ENCOUNTER — Ambulatory Visit (INDEPENDENT_AMBULATORY_CARE_PROVIDER_SITE_OTHER): Payer: Medicare Other | Admitting: Family Medicine

## 2016-05-31 ENCOUNTER — Encounter: Payer: Self-pay | Admitting: Family Medicine

## 2016-05-31 VITALS — BP 122/78 | HR 78 | Temp 98.0°F | Resp 14 | Wt 171.0 lb

## 2016-05-31 DIAGNOSIS — I10 Essential (primary) hypertension: Secondary | ICD-10-CM

## 2016-05-31 DIAGNOSIS — Z5181 Encounter for therapeutic drug level monitoring: Secondary | ICD-10-CM | POA: Diagnosis not present

## 2016-05-31 DIAGNOSIS — M8580 Other specified disorders of bone density and structure, unspecified site: Secondary | ICD-10-CM

## 2016-05-31 DIAGNOSIS — E785 Hyperlipidemia, unspecified: Secondary | ICD-10-CM

## 2016-05-31 DIAGNOSIS — E119 Type 2 diabetes mellitus without complications: Secondary | ICD-10-CM

## 2016-05-31 MED ORDER — LISINOPRIL 5 MG PO TABS: 5.0000 mg | ORAL_TABLET | Freq: Every day | ORAL | 0 refills | Status: DC

## 2016-05-31 NOTE — Assessment & Plan Note (Signed)
Stop HCTZ and start lisinopril; recheck BMP in 2-3 weeks

## 2016-05-31 NOTE — Assessment & Plan Note (Signed)
Limit saturated fats; check lipids today; continue statin 

## 2016-05-31 NOTE — Progress Notes (Signed)
BP 122/78   Pulse 78   Temp 98 F (36.7 C) (Oral)   Resp 14   Wt 171 lb (77.6 kg)   SpO2 95%   BMI 25.62 kg/m    Subjective:    Patient ID: Felicia Cisneros, female    DOB: 07-Feb-1951, 65 y.o.   MRN: [id number]  HPI: Felicia Cisneros is a 65 y.o. female  Chief Complaint  Patient presents with  . Follow-up    She has osteopenia noted on the DEXA She is taking vitamin D 5000 iu every other day; we'll check that today; walks to office, 200 feet, but not outdoors regularly; does fit and tone course [facility] the [facility]  Hypertension She has a home health visit last week; they told her she needs ACE-I or ARB; staying away from salt  Type 2 diabetes; no low sugars; 107 this morning, fasting; no numbness or sores on the feet; her toes hurt; she found an all natural cream for that and it helps; has pedicures monthly (I dissuaded her); using white wheat; does not like brown bread; no sugary drinks  No problems with heartburn, taking ranitidine daily; rarely takes a rolaids or tums  Cholesterol; on statin; no muscle aches; one egg a week; not many fatty cheap meats, no more than once a week; breakfast is usually oatmeal with pecans  Depression screen [facility] 2/9 04/26/2016 02/27/2016 09/03/2015 08/21/2015  Decreased Interest 0 0 1 3  Down, Depressed, Hopeless 0 0 0 2  PHQ - 2 Score 0 0 1 5  Altered sleeping - - 3 3  Tired, decreased energy - - 0 0  Change in appetite - - 0 0  Feeling bad or failure about yourself  - - 0 0  Trouble concentrating - - 0 0  Moving slowly or fidgety/restless - - 0 0  Suicidal thoughts - - 0 0  PHQ-9 Score - - 4 8  Difficult doing work/chores - - Not difficult at all Not difficult at all    GAD 7 : Generalized Anxiety Score 08/21/2015  Nervous, Anxious, on Edge 0  Control/stop worrying 1  Worry too much - different things 1  Trouble relaxing 1  Restless 0  Easily annoyed or irritable 0  Afraid - awful might happen 3  Total GAD 7 Score 6    Anxiety Difficulty Not difficult at all    Relevant past medical, surgical, family and social history reviewed Past Medical History:  Diagnosis Date  . Atrophic vaginitis   . Diabetes mellitus without complication ([place])   . Heartburn   . History of tobacco use   . Hyperlipidemia   . IFG (impaired fasting glucose)   . Leukorrhea, not specified as infective   . Osteopenia 05/27/2016   August 2017  . Personal history of tobacco use, presenting hazards to health 08/20/2015  . Unspecified essential hypertension    Past Surgical History:  Procedure Laterality Date  . colonoscopy    . TMJ ARTHROPLASTY  1984   Family History  Problem Relation Age of Onset  . Stroke Mother   . Hypertension Mother   . Hyperlipidemia Mother   . Diabetes Mother     pre-diabetic  . Diabetes Brother   . Irregular heart beat Brother   . Thyroid disease Sister   . Stroke Maternal Grandmother   . Breast cancer Neg Hx    Social History  Substance Use Topics  . Smoking status: Former Smoker    Packs/day:  3.00    Years: 45.00    Types: Cigarettes    Quit date: 10/25/2009  . Smokeless tobacco: Never Used  . Alcohol use 0.0 oz/week     Comment: Rarely    Interim medical history since last visit reviewed. Allergies and medications reviewed  Review of Systems Per HPI unless specifically indicated above     Objective:    BP 122/78   Pulse 78   Temp 98 F (36.7 C) (Oral)   Resp 14   Wt 171 lb (77.6 kg)   SpO2 95%   BMI 25.62 kg/m   Wt Readings from Last 3 Encounters:  05/31/16 171 lb (77.6 kg)  04/26/16 172 lb 6.4 oz (78.2 kg)  02/27/16 173 lb (78.5 kg)    Physical Exam  Constitutional: She appears well-developed and well-nourished. No distress.  HENT:  Head: Normocephalic and atraumatic.  Eyes: EOM are normal. No scleral icterus.  Neck: No thyromegaly present.  Cardiovascular: Normal rate, regular rhythm and normal heart sounds.   No murmur heard. Pulmonary/Chest: Effort normal and  breath sounds normal. No respiratory distress. She has no wheezes.  Abdominal: Soft. She exhibits no distension.  Musculoskeletal: Normal range of motion. She exhibits no edema.  Neurological: She is alert. She exhibits normal muscle tone.  Skin: Skin is warm and dry. She is not diaphoretic. No pallor.  Psychiatric: She has a normal mood and affect. Her behavior is normal. Judgment and thought content normal.   Diabetic Foot Form - Detailed   Diabetic Foot Exam - detailed Diabetic Foot exam was performed with the following findings:  Yes 05/31/2016  8:41 AM  Visual Foot Exam completed.:  Yes  Are the toenails ingrown?:  No Normal Range of Motion:  Yes Pulse Foot Exam completed.:  Yes  Right Dorsalis Pedis:  Present Left Dorsalis Pedis:  Present  Sensory Foot Exam Completed.:  Yes Swelling:  No Semmes-Weinstein Monofilament Test R Site 1-Great Toe:  Pos L Site 1-Great Toe:  Pos  R Site 4:  Pos L Site 4:  Pos  R Site 5:  Pos L Site 5:  Pos         Results for orders placed or performed in visit on 02/27/16  Microalbumin / creatinine urine ratio  Result Value Ref Range   Creatinine, Urine 173.0 Not Estab. mg/dL   Microalbum.,U,Random 15.4 Not Estab. ug/mL   MICROALB/CREAT RATIO 8.9 0.0 - 30.0 mg/g creat      Assessment & Plan:   Problem List Items Addressed This Visit      Cardiovascular and Mediastinum   Essential hypertension    Stop HCTZ and start lisinopril; recheck BMP in 2-3 weeks      Relevant Medications   lisinopril (PRINIVIL,ZESTRIL) 5 MG tablet     Endocrine   Type 2 diabetes mellitus, controlled (HCC) - Primary    Check A1c and glucose; switch BP med to ACE-I; avoid sugary drinks      Relevant Medications   lisinopril (PRINIVIL,ZESTRIL) 5 MG tablet   Other Relevant Orders   Hemoglobin A1c     Musculoskeletal and Integument   Osteopenia    Discussed; weight-bearing activity ; three servings of calcium a day; 1000 iu of vit D3 daily on average, but we'll  check vit D today to see if more needed; next DEXA in 2 years; fall precautions      Relevant Orders   VITAMIN D 25 Hydroxy (Vit-D Deficiency, Fractures)     Other   Medication  monitoring encounter    Check sgpt and creatinine      Relevant Orders   Comprehensive metabolic panel   Hyperlipidemia    Limit saturated fats; check lipids today; continue statin      Relevant Medications   lisinopril (PRINIVIL,ZESTRIL) 5 MG tablet   Other Relevant Orders   Lipid panel    Other Visit Diagnoses   None.     Follow up plan: Return in about 6 months (around 12/01/2016) for fasting labs and visit for diabetes; 2 weeks for BMP and BP only with Roselyn Reef.  An after-visit summary was printed and given to the patient at [place].  Please see the patient instructions which may contain other information and recommendations beyond what is mentioned above in the assessment and plan.  Meds ordered this encounter  Medications  . lisinopril (PRINIVIL,ZESTRIL) 5 MG tablet    Sig: Take 1 tablet (5 mg total) by mouth daily. For blood pressure    Dispense:  90 tablet    Refill:  0    Orders Placed This Encounter  Procedures  . VITAMIN D 25 Hydroxy (Vit-D Deficiency, Fractures)  . Hemoglobin A1c  . Lipid panel  . Comprehensive metabolic panel

## 2016-05-31 NOTE — Assessment & Plan Note (Signed)
Check A1c and glucose; switch BP med to ACE-I; avoid sugary drinks

## 2016-05-31 NOTE — Assessment & Plan Note (Signed)
Discussed; weight-bearing activity ; three servings of calcium a day; 1000 iu of vit D3 daily on average, but we'll check vit D today to see if more needed; next DEXA in 2 years; fall precautions

## 2016-05-31 NOTE — Patient Instructions (Addendum)
Try to limit saturated fats in your diet (bologna, hot dogs, barbeque, cheeseburgers, hamburgers, steak, bacon, sausage, cheese, etc.) and get more fresh fruits, vegetables, and whole grains  Your goal blood pressure is less than 140 mmHg on top. Try to follow the DASH guidelines (DASH stands for Dietary Approaches to Stop Hypertension) Try to limit the sodium in your diet.  Ideally, consume less than 1.5 grams (less than 1,500mg ) per day. Do not add salt when cooking or at the table.  Check the sodium amount on labels when shopping, and choose items lower in sodium when given a choice. Avoid or limit foods that already contain a lot of sodium. Eat a diet rich in fruits and vegetables and whole grains.  STOP the HCTZ Start the lisinopril Return in 2-3 weeks for BMP and blood pressure with CMA Do not use salt substitutes

## 2016-05-31 NOTE — Assessment & Plan Note (Signed)
Check sgpt and creatinine

## 2016-06-01 LAB — LIPID PANEL
Cholesterol: 142 mg/dL (ref 125–200)
HDL: 53 mg/dL (ref >=46)
LDL Cholesterol: 61 mg/dL (ref <130)
Total CHOL/HDL Ratio: 2.7 ratio (ref <=5.0)
Triglycerides: 142 mg/dL (ref <150)
VLDL: 28 mg/dL (ref <30)

## 2016-06-01 LAB — COMPREHENSIVE METABOLIC PANEL WITH GFR
ALT: 16 U/L (ref 6–29)
AST: 15 U/L (ref 10–35)
Albumin: 4.4 g/dL (ref 3.6–5.1)
Alkaline Phosphatase: 68 U/L (ref 33–130)
BUN: 18 mg/dL (ref 7–25)
CO2: 28 mmol/L (ref 20–31)
Calcium: 9.6 mg/dL (ref 8.6–10.4)
Chloride: 104 mmol/L (ref 98–110)
Creat: 0.66 mg/dL (ref 0.50–0.99)
Glucose, Bld: 105 mg/dL — ABNORMAL HIGH (ref 65–99)
Potassium: 4.6 mmol/L (ref 3.5–5.3)
Sodium: 142 mmol/L (ref 135–146)
Total Bilirubin: 0.6 mg/dL (ref 0.2–1.2)
Total Protein: 6.4 g/dL (ref 6.1–8.1)

## 2016-06-01 LAB — HEMOGLOBIN A1C
Hgb A1c MFr Bld: 5.8 % — ABNORMAL HIGH (ref <5.7)
Mean Plasma Glucose: 120 mg/dL

## 2016-06-01 LAB — VITAMIN D 25 HYDROXY (VIT D DEFICIENCY, FRACTURES): Vit D, 25-Hydroxy: 52 ng/mL (ref 30–100)

## 2016-06-11 DIAGNOSIS — H903 Sensorineural hearing loss, bilateral: Secondary | ICD-10-CM | POA: Diagnosis not present

## 2016-06-16 ENCOUNTER — Ambulatory Visit (INDEPENDENT_AMBULATORY_CARE_PROVIDER_SITE_OTHER): Payer: Medicare Other

## 2016-06-16 ENCOUNTER — Other Ambulatory Visit: Payer: Self-pay | Admitting: Family Medicine

## 2016-06-16 ENCOUNTER — Other Ambulatory Visit: Payer: Self-pay

## 2016-06-16 VITALS — BP 124/80 | HR 87 | Temp 97.9°F

## 2016-06-16 DIAGNOSIS — Z5181 Encounter for therapeutic drug level monitoring: Secondary | ICD-10-CM

## 2016-06-16 DIAGNOSIS — I10 Essential (primary) hypertension: Secondary | ICD-10-CM

## 2016-06-16 LAB — BASIC METABOLIC PANEL WITH GFR
BUN: 22 mg/dL (ref 7–25)
CHLORIDE: 109 mmol/L (ref 98–110)
CO2: 28 mmol/L (ref 20–31)
Calcium: 9.3 mg/dL (ref 8.6–10.4)
Creat: 0.74 mg/dL (ref 0.50–0.99)
GFR, Est African American: >89 mL/min (ref >=60)
GFR, Est Non African American: 85 mL/min (ref >=60)
Glucose, Bld: 109 mg/dL — ABNORMAL HIGH (ref 65–99)
POTASSIUM: 4.7 mmol/L (ref 3.5–5.3)
SODIUM: 143 mmol/L (ref 135–146)

## 2016-06-16 NOTE — Assessment & Plan Note (Signed)
Check bmp 

## 2016-06-16 NOTE — Progress Notes (Signed)
BP excellent; check BMP and continue med

## 2016-07-02 ENCOUNTER — Other Ambulatory Visit: Payer: Self-pay

## 2016-07-02 DIAGNOSIS — E119 Type 2 diabetes mellitus without complications: Secondary | ICD-10-CM

## 2016-07-02 MED ORDER — LISINOPRIL 5 MG PO TABS: 5.0000 mg | ORAL_TABLET | Freq: Every day | ORAL | 1 refills | Status: DC

## 2016-07-02 NOTE — Telephone Encounter (Signed)
Aug labs reviewed; Rx for lisinopril approved She should not be out of metformin or simvastatin

## 2016-07-08 ENCOUNTER — Encounter: Payer: Self-pay | Admitting: Cardiovascular Disease

## 2016-07-08 ENCOUNTER — Ambulatory Visit (INDEPENDENT_AMBULATORY_CARE_PROVIDER_SITE_OTHER): Payer: Medicare Other | Admitting: Cardiovascular Disease

## 2016-07-08 VITALS — BP 110/64 | HR 74 | Ht 68.0 in | Wt 172.5 lb

## 2016-07-08 DIAGNOSIS — I700 Atherosclerosis of aorta: Secondary | ICD-10-CM

## 2016-07-08 DIAGNOSIS — R9431 Abnormal electrocardiogram [ECG] [EKG]: Secondary | ICD-10-CM | POA: Diagnosis not present

## 2016-07-08 DIAGNOSIS — Z87891 Personal history of nicotine dependence: Secondary | ICD-10-CM | POA: Diagnosis not present

## 2016-07-08 DIAGNOSIS — I10 Essential (primary) hypertension: Secondary | ICD-10-CM | POA: Diagnosis not present

## 2016-07-08 DIAGNOSIS — E1159 Type 2 diabetes mellitus with other circulatory complications: Secondary | ICD-10-CM

## 2016-07-08 DIAGNOSIS — E785 Hyperlipidemia, unspecified: Secondary | ICD-10-CM | POA: Diagnosis not present

## 2016-07-08 NOTE — Progress Notes (Signed)
Cardiology Office Note  Date:  07/08/2016   ID:  Felicia Cisneros, DOB [birth year], MRN [id number]  PCP:  Enid Derry, MD   Chief Complaint  Patient presents with  . other    Patient was seen last by Dr. Rockey Situ in 2014. Meds reviewed by the pt. verbally.  Pt. recently diagnosed with Type II diabetes and was told by Dr. [name] need to follow up with her cardiologist.      HPI:  Mr. Bonello is a very pleasant 65 year old woman with long history of smoking, Hyperlipidemia Previous symptoms of dizziness and numbness on prior evaluation in 2014, osteoporosis who presents by referral from Dr. [name] for type 2 diabetes, risk stratification  She reports that she is doing well, active Denies any symptoms of chest pain or shortness of breath on exertion  stopped smoking approximately 5 years ago, did smoke on and off for 40 years Trying to watch her diet, hemoglobin A1c 5.8 Does work out at the recreation center, tone and fit class   Other lab work reviewed  Total chol 142, LDL 61  CT chest 2016: reviewed with her in detail, images pulled up in the office  No significant coronary ca Mild diffuse aortic atherosclerosis Seen in the aortic arch, descending aorta    main complaint is her Toes hurt, chronic Issue, etiology unclear   significant stress at home, lost of 2 family members this past year Husband had cancer  EKG on today's visit shows normal sinus rhythm with rate 74 bpm, unable to exclude old inferior MI. No change from previous EKG years ago  Other past medical history  On 07/10/2013 she ate lunch and during her meal developed numbness all over, dizziness that lasted for approximately one hour. He denies any nausea, vomiting, diarrhea. Her husband took her to the emergency room. She had lab work, chest x-ray, EKG, head CT scan. Potassium was 3.3, negative troponin.  scan of the head was essentially normal.  She has osteopenia noted on the DEXA, Hypertension, Type 2 diabetes;  hyperlipidemia,   PMH:   has a past medical history of Atrophic vaginitis; Diabetes mellitus without complication ([place]); Heartburn; History of tobacco use; Hyperlipidemia; IFG (impaired fasting glucose); Leukorrhea, not specified as infective; Osteopenia (05/27/2016); Personal history of tobacco use, presenting hazards to health (08/20/2015); and Unspecified essential hypertension.  PSH:    Past Surgical History:  Procedure Laterality Date  . colonoscopy    . TMJ ARTHROPLASTY  1984    Current Outpatient Prescriptions  Medication Sig Dispense Refill  . aspirin 81 MG tablet Take 81 mg by mouth daily.    . Calcium-Vitamin D-Vitamin K (CHEWABLE CALCIUM) 500-200-40 MG-UNT-MCG CHEW Chew by mouth 2 (two) times daily.    Marland Kitchen CINNAMON PO Take 200 mg by mouth 3 (three) times daily.     . Coenzyme Q10 (CO Q 10) 10 MG CAPS Take 400 mg by mouth daily.    . Lancets (ACCU-CHEK SOFT TOUCH) lancets Use as instructed 100 each 12  . lisinopril (PRINIVIL,ZESTRIL) 5 MG tablet Take 1 tablet (5 mg total) by mouth daily. For blood pressure 90 tablet 1  . metFORMIN (GLUCOPHAGE-XR) 500 MG 24 hr tablet Take 1 tablet (500 mg total) by mouth daily with breakfast. 30 tablet 6  . Multiple Vitamins-Minerals (CENTRUM SILVER ADULT 50+ PO) Take 1 tablet by mouth daily.     Glory Rosebush VERIO test strip CHECK FINGERSTICK BLOOD SUGARS ONCE A DAY 100 each 1  . Probiotic Product (PROBIOTIC PO) Take  1 tablet by mouth daily.    . ranitidine (ZANTAC) 75 MG tablet Take 75 mg by mouth at bedtime.    . simvastatin (ZOCOR) 10 MG tablet Take 1 tablet (10 mg total) by mouth at bedtime. 30 tablet 6  . Turmeric 500 MG CAPS Take 300 mg by mouth.     No current facility-administered medications for this visit.      Allergies:   Review of patient's allergies indicates no known allergies.   Social History:  The patient  reports that she quit smoking about 6 years ago. Her smoking use included Cigarettes. She has a 135.00 pack-year smoking  history. She has never used smokeless tobacco. She reports that she drinks alcohol. She reports that she does not use drugs.   Family History:   family history includes Diabetes in her brother and mother; Hyperlipidemia in her mother; Hypertension in her mother; Irregular heart beat in her brother; Stroke in her maternal grandmother and mother; Thyroid disease in her sister.    Review of Systems: Review of Systems  Constitutional: Negative.   Respiratory: Negative.   Cardiovascular: Negative.   Gastrointestinal: Negative.   Musculoskeletal: Negative.        Pain in her toes  Neurological: Negative.   Psychiatric/Behavioral: Negative.   All other systems reviewed and are negative.    PHYSICAL EXAM: VS:  BP 110/64 (BP Location: Left Arm, Patient Position: Sitting, Cuff Size: Normal)   Pulse 74   Ht 5\' 8"  (1.727 m)   Wt 172 lb 8 oz (78.2 kg)   BMI 26.23 kg/m  , BMI Body mass index is 26.23 kg/m. GEN: Well nourished, well developed, in no acute distress  HEENT: normal  Neck: no JVD, carotid bruits, or masses Cardiac: RRR; no murmurs, rubs, or gallops,no edema  Respiratory:  clear to auscultation bilaterally, normal work of breathing GI: soft, nontender, nondistended, + BS MS: no deformity or atrophy  Skin: warm and dry, no rash Neuro:  Strength and sensation are intact Psych: euthymic mood, full affect    Recent Labs: 08/22/2015: Platelets 229 05/31/2016: ALT 16 06/16/2016: BUN 22; Creat 0.74; Potassium 4.7; Sodium 143    Lipid Panel Lab Results  Component Value Date   CHOL 142 05/31/2016   HDL 53 05/31/2016   LDLCALC 61 05/31/2016   TRIG 142 05/31/2016      Wt Readings from Last 3 Encounters:  07/08/16 172 lb 8 oz (78.2 kg)  05/31/16 171 lb (77.6 kg)  04/26/16 172 lb 6.4 oz (78.2 kg)       ASSESSMENT AND PLAN:  Hyperlipidemia Cholesterol is at goal on the current lipid regimen. No changes to the medications were made.  History of tobacco use Stopped  smoking 5 years ago, denies significant shortness of breath on exertion CT scan 2016 for lung screening  Controlled type 2 diabetes mellitus with other circulatory complication, without long-term current use of insulin (HCC) A1c 5.8 We have encouraged continued exercise, careful diet management  Aortic atherosclerosis (HCC)  images reviewed with her,  mild aortic atherosclerosis, no significant coronary calcifications noted . discussed with her in detail  Likely secondary to prior smoking history, hyperlipidemia   now all of her risk factors are well controlled No further workup at this time as she is asymptomatic   Total encounter time more than 45 minutes  Greater than 50% was spent in counseling and coordination of care with the patient   Disposition:   F/U  12 months   Orders  Placed This Encounter  Procedures  . EKG 12-Lead     Signed, Esmond Plants, M.D., Ph.D. 07/08/2016  [place], [address]

## 2016-07-08 NOTE — Patient Instructions (Signed)

## 2016-07-09 ENCOUNTER — Telehealth: Payer: Self-pay | Admitting: Cardiovascular Disease

## 2016-07-09 NOTE — Telephone Encounter (Signed)
Spoke w/ pt.  Advised her that diagnosis of abnormal ekg was based on ekg from 2014 and was one of the reasons for her visit. Her ekg from yesterday was NSR. She is appreciative of the call.

## 2016-07-09 NOTE — Telephone Encounter (Signed)
Pt states she read on her AVS from yesterday, that she had an abnormal EKG. Please call and explain.

## 2016-07-26 ENCOUNTER — Other Ambulatory Visit: Payer: Self-pay

## 2016-07-26 DIAGNOSIS — E119 Type 2 diabetes mellitus without complications: Secondary | ICD-10-CM

## 2016-07-26 MED ORDER — SIMVASTATIN 10 MG PO TABS: 10.0000 mg | ORAL_TABLET | Freq: Every day | ORAL | 1 refills | Status: DC

## 2016-07-26 MED ORDER — METFORMIN HCL ER 500 MG PO TB24: 500.0000 mg | ORAL_TABLET | Freq: Every day | ORAL | 1 refills | Status: DC

## 2016-07-26 MED ORDER — LISINOPRIL 5 MG PO TABS: 5.0000 mg | ORAL_TABLET | Freq: Every day | ORAL | 1 refills | Status: DC

## 2016-07-26 NOTE — Telephone Encounter (Signed)
90 day supplys

## 2016-07-26 NOTE — Telephone Encounter (Signed)
Aug 2017 labs reviewed; Rxs approved

## 2016-07-28 ENCOUNTER — Telehealth: Payer: Self-pay | Admitting: Family Medicine

## 2016-07-28 MED ORDER — METFORMIN HCL ER 500 MG PO TB24: 500.0000 mg | ORAL_TABLET | Freq: Every day | ORAL | 1 refills | Status: DC

## 2016-07-28 NOTE — Telephone Encounter (Signed)
Patient is completely out of Metformin. She is asking that you please send one refill to [facility]. It will hold her until she receive her prescription from [facility]. Also patient has very sensitive nipple. She has been putting bactrovan gel on it and it has helped some. Would like to know if she can continue to put that  Gel on it or if you suggest her coming in to be seen. You are completely booked for at least 3 weeks. Please advise

## 2016-07-28 NOTE — Telephone Encounter (Signed)
[facility] [place] She can use the antibiotic ointment on the nipple for another few days; she can also try warm compresses over the area; if not improving in a few days, go to urgent care; go sooner if fever or worsening redness, hardness, or discharge

## 2016-07-28 NOTE — Telephone Encounter (Signed)
Pt.notified

## 2016-08-02 ENCOUNTER — Telehealth: Payer: Self-pay | Admitting: Family Medicine
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
## 2016-08-05 ENCOUNTER — Other Ambulatory Visit: Payer: Self-pay | Admitting: *Deleted

## 2016-08-05 DIAGNOSIS — Z87891 Personal history of nicotine dependence: Secondary | ICD-10-CM

## 2016-08-26 ENCOUNTER — Telehealth: Payer: Self-pay | Admitting: *Deleted

## 2016-08-26 ENCOUNTER — Ambulatory Visit
Admission: RE | Admit: 2016-08-26 | Discharge: 2016-08-26 | Disposition: A | Discharge status: Home / Self Care | Payer: Medicare Other | Source: Ambulatory Visit | Attending: Oncology | Admitting: Oncology

## 2016-08-26 DIAGNOSIS — R911 Solitary pulmonary nodule: Secondary | ICD-10-CM | POA: Insufficient documentation

## 2016-08-26 DIAGNOSIS — I700 Atherosclerosis of aorta: Secondary | ICD-10-CM | POA: Diagnosis not present

## 2016-08-26 DIAGNOSIS — Z87891 Personal history of nicotine dependence: Secondary | ICD-10-CM | POA: Diagnosis not present

## 2016-08-26 NOTE — Telephone Encounter (Signed)
On 08/03/16, notified patient that annual lung cancer screening low dose CT scan is due. Confirmed that patient is within the age range of 55-77, and asymptomatic, (no signs or symptoms of lung cancer). Patient denies illness that would prevent curative treatment for lung cancer if found. The patient is a former smoker, quit in 2012 with a 88 pack year history. The shared decision making visit was done 07/26/14. Patient is agreeable for CT scan being scheduled.

## 2016-08-27 ENCOUNTER — Telehealth: Payer: Self-pay | Admitting: *Deleted

## 2016-08-27 NOTE — Telephone Encounter (Signed)
Called Report  IMPRESSION: 1. Lung-RADS Category 4A, suspicious. Follow up low-dose chest CT without contrast in 3 months (please use the following order, "CT CHEST LCS NODULE FOLLOW-UP W/O CM") is recommended. A superior segment left lower lobe pulmonary nodule is new since the prior exam. 2.  Aortic atherosclerosis. These results will be called to the ordering clinician or representative by the Radiologist Assistant, and communication documented in the PACS or zVision Dashboard.   Electronically Signed   By: Abigail Miyamoto M.D.   On: 08/26/2016 16:45

## 2016-08-30 ENCOUNTER — Telehealth: Payer: Self-pay | Admitting: *Deleted

## 2016-08-30 NOTE — Telephone Encounter (Signed)
Notified patient of LDCT lung cancer screening results with recommendation for 3 month follow up imaging. Discussed at length with patient and will forward to PCP via Epic. Patient verbalizes understanding.   IMPRESSION: 1. Lung-RADS Category 4A, suspicious. Follow up low-dose chest CT without contrast in 3 months (please use the following order, "CT CHEST LCS NODULE FOLLOW-UP W/O CM") is recommended. A superior segment left lower lobe pulmonary nodule is new since the prior exam. 2.  Aortic atherosclerosis. These results will be called to the ordering clinician or representative by the Radiologist Assistant, and communication documented in the PACS or zVision Dashboard.

## 2016-09-01 ENCOUNTER — Emergency Department
Admission: EM | Admit: 2016-09-01 | Discharge: 2016-09-01 | Disposition: A | Discharge status: Home / Self Care | Payer: Medicare Other | Attending: Emergency Medicine | Admitting: Emergency Medicine

## 2016-09-01 ENCOUNTER — Encounter: Payer: Self-pay | Admitting: Emergency Medicine

## 2016-09-01 ENCOUNTER — Emergency Department: Payer: Medicare Other

## 2016-09-01 DIAGNOSIS — R1012 Left upper quadrant pain: Secondary | ICD-10-CM | POA: Insufficient documentation

## 2016-09-01 DIAGNOSIS — R079 Chest pain, unspecified: Secondary | ICD-10-CM | POA: Insufficient documentation

## 2016-09-01 DIAGNOSIS — R42 Dizziness and giddiness: Secondary | ICD-10-CM | POA: Insufficient documentation

## 2016-09-01 DIAGNOSIS — E119 Type 2 diabetes mellitus without complications: Secondary | ICD-10-CM | POA: Diagnosis not present

## 2016-09-01 DIAGNOSIS — Z7982 Long term (current) use of aspirin: Secondary | ICD-10-CM | POA: Insufficient documentation

## 2016-09-01 DIAGNOSIS — Z87891 Personal history of nicotine dependence: Secondary | ICD-10-CM | POA: Insufficient documentation

## 2016-09-01 DIAGNOSIS — Z79899 Other long term (current) drug therapy: Secondary | ICD-10-CM | POA: Diagnosis not present

## 2016-09-01 DIAGNOSIS — Z7984 Long term (current) use of oral hypoglycemic drugs: Secondary | ICD-10-CM | POA: Insufficient documentation

## 2016-09-01 DIAGNOSIS — I10 Essential (primary) hypertension: Secondary | ICD-10-CM | POA: Insufficient documentation

## 2016-09-01 HISTORY — DX: Diverticulitis of intestine, part unspecified, without perforation or abscess without bleeding: K57.92

## 2016-09-01 LAB — CBC
HCT: 45.9 % (ref 35.0–47.0)
Hemoglobin: 15.4 g/dL (ref 12.0–16.0)
MCH: 30.5 pg (ref 26.0–34.0)
MCHC: 33.5 g/dL (ref 32.0–36.0)
MCV: 91.1 fL (ref 80.0–100.0)
Platelets: 211 10*3/uL (ref 150–440)
RBC: 5.04 MIL/uL (ref 3.80–5.20)
RDW: 13.7 % (ref 11.5–14.5)
WBC: 6.1 10*3/uL (ref 3.6–11.0)

## 2016-09-01 LAB — URINALYSIS COMPLETE WITH MICROSCOPIC (ARMC ONLY)
BACTERIA UA: NONE SEEN
Bilirubin Urine: NEGATIVE
GLUCOSE, UA: NEGATIVE mg/dL
Ketones, ur: NEGATIVE mg/dL
NITRITE: NEGATIVE
PH: 7 (ref 5.0–8.0)
PROTEIN: NEGATIVE mg/dL
Specific Gravity, Urine: 1.009 (ref 1.005–1.030)
Squamous Epithelial / LPF: NONE SEEN

## 2016-09-01 LAB — COMPREHENSIVE METABOLIC PANEL
ALT: 26 U/L (ref 14–54)
AST: 26 U/L (ref 15–41)
Albumin: 4.3 g/dL (ref 3.5–5.0)
Alkaline Phosphatase: 70 U/L (ref 38–126)
Anion gap: 7 (ref 5–15)
BUN: 15 mg/dL (ref 6–20)
CO2: 26 mmol/L (ref 22–32)
Calcium: 9.2 mg/dL (ref 8.9–10.3)
Chloride: 106 mmol/L (ref 101–111)
Creatinine, Ser: 0.75 mg/dL (ref 0.44–1.00)
GFR calc Af Amer: >60 mL/min (ref >60)
GFR calc non Af Amer: >60 mL/min (ref >60)
Glucose, Bld: 102 mg/dL — ABNORMAL HIGH (ref 65–99)
Potassium: 4.2 mmol/L (ref 3.5–5.1)
Sodium: 139 mmol/L (ref 135–145)
Total Bilirubin: 0.4 mg/dL (ref 0.3–1.2)
Total Protein: 6.8 g/dL (ref 6.5–8.1)

## 2016-09-01 LAB — TROPONIN I

## 2016-09-01 LAB — LIPASE, BLOOD: Lipase: 28 U/L (ref 11–51)

## 2016-09-01 MED ORDER — FAMOTIDINE 20 MG PO TABS
40.0000 mg | ORAL_TABLET | Freq: Once | ORAL | Status: AC
  Administered 2016-09-01: 40 mg via ORAL
  Filled 2016-09-01: qty 2

## 2016-09-01 MED ORDER — RANITIDINE HCL 150 MG PO CAPS: 300.0000 mg | ORAL_CAPSULE | Freq: Two times a day (BID) | ORAL | 0 refills | Status: DC

## 2016-09-01 MED ORDER — SUCRALFATE 1 G PO TABS: 1.0000 g | ORAL_TABLET | Freq: Four times a day (QID) | ORAL | 1 refills | Status: DC

## 2016-09-01 MED ORDER — GI COCKTAIL ~~LOC~~
30.0000 mL | ORAL | Status: AC
  Administered 2016-09-01: 30 mL via ORAL
  Filled 2016-09-01: qty 30

## 2016-09-01 NOTE — ED Provider Notes (Signed)
[facility] Emergency Department Provider Note  ____________________________________________  Time seen: Approximately 6:02 PM  I have reviewed the triage vital signs and the nursing notes.   HISTORY  Chief Complaint Chest Pain; Abdominal Pain; and Dizziness    HPI Felicia Cisneros is a 65 y.o. female who reports that about 2 PM after eating lunch today she's been having intermittent fleeting left upper quadrant abdominal pain, sometimes associated with left shoulder pain. Not currently present. Subcutaneous seconds at a time. Sharp and stabbing. No shortness of breath vomiting or diaphoresis. Not exertional, not pleuritic. Never had pain like this before.  She was in her usual state of health until yesterday when she went to the dentist for some sedation dentistry. She had not eaten all morning, received large doses of anesthetics, and it was unable to eat for much of the day. She tried eating some tomato soup later in the day. Then this morning, she ate a cinnamon roll and coffee for breakfast, and then had some vegetables and chicken for lunch. Afterwards she started having this pain. No aggravating or alleviating factors. No recent travel, hospitalization or surgery. No history of DVT or PE.  The patient takes Zantac 75 mg once daily.   Past Medical History:  Diagnosis Date  . Atrophic vaginitis   . Diabetes mellitus without complication ([place])   . Diverticulitis   . Heartburn   . History of tobacco use   . Hyperlipidemia   . IFG (impaired fasting glucose)   . Leukorrhea, not specified as infective   . Osteopenia 05/27/2016   August 2017  . Personal history of tobacco use, presenting hazards to health 08/20/2015  . Unspecified essential hypertension      Patient Active Problem List   Diagnosis Date Noted  . Aortic atherosclerosis ([place]) 07/08/2016  . Osteopenia 05/27/2016  . Breast cancer screening 04/26/2016  . Need for shingles vaccine 04/01/2016   . Abnormality of nail tissue 11/27/2015  . Medication monitoring encounter 11/27/2015  . Needs flu shot 08/22/2015  . Preventative health care 08/22/2015  . Colon cancer screening 08/22/2015  . Herpes simplex antibody positive 08/22/2015  . Cervical cancer screening 08/22/2015  . Personal history of tobacco use, presenting hazards to health 08/20/2015  . Essential hypertension   . Type 2 diabetes mellitus, controlled ([place])   . Atrophic vaginitis   . History of tobacco use   . Abnormal EKG 07/13/2013  . Dizziness 07/13/2013  . Hyperlipidemia 07/13/2013     Past Surgical History:  Procedure Laterality Date  . colonoscopy    . TMJ ARTHROPLASTY  1984     Prior to Admission medications   Medication Sig Start Date End Date Taking? Authorizing Provider  aspirin 81 MG tablet Take 81 mg by mouth daily.    Historical Provider, MD  Calcium-Vitamin D-Vitamin K (CHEWABLE CALCIUM) 500-200-40 MG-UNT-MCG CHEW Chew by mouth 2 (two) times daily.    Historical Provider, MD  CINNAMON PO Take 200 mg by mouth 3 (three) times daily.     Historical Provider, MD  Coenzyme Q10 (CO Q 10) 10 MG CAPS Take 400 mg by mouth daily.    Historical Provider, MD  Lancets (ACCU-CHEK SOFT TOUCH) lancets Use as instructed 11/27/15   Arnetha Courser, MD  lisinopril (PRINIVIL,ZESTRIL) 5 MG tablet Take 1 tablet (5 mg total) by mouth daily. For blood pressure 07/26/16   Arnetha Courser, MD  metFORMIN (GLUCOPHAGE-XR) 500 MG 24 hr tablet Take 1 tablet (500 mg total)  by mouth daily with breakfast. 07/28/16   Arnetha Courser, MD  Multiple Vitamins-Minerals (CENTRUM SILVER ADULT 50+ PO) Take 1 tablet by mouth daily.     Historical Provider, MD  ONETOUCH VERIO test strip CHECK FINGERSTICK BLOOD SUGARS ONCE A DAY 04/23/16   Arnetha Courser, MD  Probiotic Product (PROBIOTIC PO) Take 1 tablet by mouth daily.    Historical Provider, MD  ranitidine (ZANTAC) 150 MG capsule Take 2 capsules (300 mg total) by mouth 2 (two) times daily. 09/01/16    Carrie Mew, MD  ranitidine (ZANTAC) 75 MG tablet Take 75 mg by mouth at bedtime.    Historical Provider, MD  simvastatin (ZOCOR) 10 MG tablet Take 1 tablet (10 mg total) by mouth at bedtime. 07/26/16   Arnetha Courser, MD  sucralfate (CARAFATE) 1 g tablet Take 1 tablet (1 g total) by mouth 4 (four) times daily. 09/01/16   Carrie Mew, MD  Turmeric 500 MG CAPS Take 300 mg by mouth.    Historical Provider, MD     Allergies Patient has no known allergies.   Family History  Problem Relation Age of Onset  . Stroke Mother   . Hypertension Mother   . Hyperlipidemia Mother   . Diabetes Mother     pre-diabetic  . Diabetes Brother   . Irregular heart beat Brother   . Thyroid disease Sister   . Stroke Maternal Grandmother   . Breast cancer Neg Hx     Social History Social History  Substance Use Topics  . Smoking status: Former Smoker    Packs/day: 3.00    Years: 45.00    Types: Cigarettes    Quit date: 10/25/2009  . Smokeless tobacco: Never Used  . Alcohol use 0.0 oz/week     Comment: Rarely    Review of Systems  Constitutional:   No fever or chills.  ENT:   No sore throat. No rhinorrhea. Cardiovascular:   No chest pain. Respiratory:   No dyspnea or cough. Gastrointestinal:   Positive as above for abdominal pain. No vomiting or diarrhea.  10-point ROS otherwise negative.  ____________________________________________   PHYSICAL EXAM:  VITAL SIGNS: ED Triage Vitals [09/01/16 1533]  Enc Vitals Group     BP (!) 144/89     Pulse Rate 89     Resp 16     Temp 98.1 F (36.7 C)     Temp Source Oral     SpO2 98 %     Weight 165 lb (74.8 kg)     Height 5\' 9"  (1.753 m)     Head Circumference      Peak Flow      Pain Score 2     Pain Loc      Pain Edu?      Excl. in [place]?     Vital signs reviewed, nursing assessments reviewed.   Constitutional:   Alert and oriented. Well appearing and in no distress. Eyes:   No scleral icterus. No conjunctival pallor. PERRL.  EOMI.  No nystagmus. ENT   Head:   Normocephalic and atraumatic.   Nose:   No congestion/rhinnorhea. No septal hematoma   Mouth/Throat:   MMM, no pharyngeal erythema. No peritonsillar mass.    Neck:   No stridor. No SubQ emphysema. No meningismus. Hematological/Lymphatic/Immunilogical:   No cervical lymphadenopathy. Cardiovascular:   RRR. Symmetric bilateral radial and DP pulses.  No murmurs.  Respiratory:   Normal respiratory effort without tachypnea nor retractions. Breath sounds are clear and  equal bilaterally. No wheezes/rales/rhonchi. Gastrointestinal:   Soft and nontender. Non distended. There is no CVA tenderness.  No rebound, rigidity, or guarding. Genitourinary:   deferred Musculoskeletal:   Nontender with normal range of motion in all extremities. No joint effusions.  No lower extremity tenderness.  No edema. Neurologic:   Normal speech and language.  CN 2-10 normal. Motor grossly intact. No gross focal neurologic deficits are appreciated.  Skin:    Skin is warm, dry and intact. No rash noted.  No petechiae, purpura, or bullae.  ____________________________________________    LABS (pertinent positives/negatives) (all labs ordered are listed, but only abnormal results are displayed) Labs Reviewed  COMPREHENSIVE METABOLIC PANEL - Abnormal; Notable for the following:       Result Value   Glucose, Bld 102 (*)    All other components within normal limits  URINALYSIS COMPLETEWITH MICROSCOPIC ([facility] ONLY) - Abnormal; Notable for the following:    Color, Urine STRAW (*)    APPearance CLEAR (*)    Hgb urine dipstick 1+ (*)    Leukocytes, UA TRACE (*)    All other components within normal limits  LIPASE, BLOOD  CBC  TROPONIN I  TROPONIN I   ____________________________________________   EKG  Interpreted by me Normal sinus rhythm rate of 82, normal axis intervals. Incomplete right bundle-branch block. Poor R-wave progression in anterior precordial leads.  Normal ST segments and T waves. No acute ischemic changes. No evidence of right heart strain.  ____________________________________________    RADIOLOGY  Chest x-ray unremarkable  ____________________________________________   PROCEDURES Procedures  ____________________________________________   INITIAL IMPRESSION / ASSESSMENT AND PLAN / ED COURSE  Pertinent labs & imaging results that were available during my care of the patient were reviewed by me and considered in my medical decision making (see chart for details).  Patient well appearing no acute distress, presents with atypical with upper quadrant abdominal pain.Considering the patient's symptoms, medical history, and physical examination today, I have low suspicion for ACS, PE, TAD, pneumothorax, carditis, mediastinitis, pneumonia, CHF, or sepsis. I have low suspicion for cholecystitis or biliary pathology, pancreatitis, perforation or bowel obstruction, hernia, intra-abdominal abscess, AAA or dissection, volvulus or intussusception, mesenteric ischemia, or appendicitis.  I think most likely with the pattern of her symptoms, this is probably due to gastritis and acid reflux. We'll try antacids while checking a second troponin. Patient is suitable for outpatient follow-up with her primary care doctor if second troponin is also unremarkable. Her home Zantac dose appears to be very low and not likely to be effective. I'll have her take a high dose of Zantac for a week to help control her symptoms.   Clinical Course    ____________________________________________   FINAL CLINICAL IMPRESSION(S) / ED DIAGNOSES  Final diagnoses:  Left upper quadrant pain       Portions of this note were generated with dragon dictation software. Dictation errors may occur despite best attempts at proofreading.    Carrie Mew, MD 09/03/16 [phone]

## 2016-09-01 NOTE — ED Triage Notes (Signed)
Pt comes into the ED via POV c/o left sided stabbing pains that are in her upper abdomen and into the chest.  Patient present tearful and states that it hurts to take a deep breath.  States these episodes have been intermittent for the past hour and a half.  Patient present with even and unlabored respirations.

## 2016-09-02 DIAGNOSIS — L821 Other seborrheic keratosis: Secondary | ICD-10-CM | POA: Diagnosis not present

## 2016-09-02 DIAGNOSIS — L570 Actinic keratosis: Secondary | ICD-10-CM | POA: Diagnosis not present

## 2016-09-02 NOTE — Telephone Encounter (Signed)
I left a message on patient's identified voicemail I see the note from the cancer center; understand her concern; I'm happy to see her here in the office to discuss

## 2016-10-21 ENCOUNTER — Other Ambulatory Visit: Payer: Self-pay | Admitting: Family Medicine

## 2016-10-21 DIAGNOSIS — E119 Type 2 diabetes mellitus without complications: Secondary | ICD-10-CM

## 2016-10-25 DIAGNOSIS — I499 Cardiac arrhythmia, unspecified: Secondary | ICD-10-CM

## 2016-10-25 HISTORY — DX: Cardiac arrhythmia, unspecified: I49.9

## 2016-10-26 MED ORDER — METFORMIN HCL ER 500 MG PO TB24: 500.0000 mg | ORAL_TABLET | Freq: Every day | ORAL | 0 refills | Status: DC

## 2016-10-26 NOTE — Telephone Encounter (Signed)
Glitch in computer system; I was not getting refill requests; addressing today --------------------- Last sgpt and cr and K+ reviewed; Rxs approved

## 2016-11-04 ENCOUNTER — Telehealth: Payer: Self-pay | Admitting: *Deleted

## 2016-11-04 DIAGNOSIS — R9389 Abnormal findings on diagnostic imaging of other specified body structures: Secondary | ICD-10-CM

## 2016-11-04 NOTE — Telephone Encounter (Signed)
Notified patient that lung cancer screening low dose CT scan follow up imaging is due soon. Confirmed that patient is within the age range of 55-77, and asymptomatic, (no signs or symptoms of lung cancer). Patient denies illness that would prevent curative treatment for lung cancer if found. The patient is a former smoker, quit 2012, with a 88 pack year history. The shared decision making visit was done 07/26/14. Patient is agreeable for CT scan being scheduled.

## 2016-11-29 ENCOUNTER — Ambulatory Visit
Admission: RE | Admit: 2016-11-29 | Discharge: 2016-11-29 | Disposition: A | Discharge status: Home / Self Care | Payer: Medicare Other | Source: Ambulatory Visit | Attending: Oncology | Admitting: Oncology

## 2016-11-29 DIAGNOSIS — R918 Other nonspecific abnormal finding of lung field: Secondary | ICD-10-CM | POA: Diagnosis not present

## 2016-11-29 DIAGNOSIS — I700 Atherosclerosis of aorta: Secondary | ICD-10-CM | POA: Diagnosis not present

## 2016-11-29 DIAGNOSIS — R938 Abnormal findings on diagnostic imaging of other specified body structures: Secondary | ICD-10-CM | POA: Diagnosis not present

## 2016-11-29 DIAGNOSIS — Z87891 Personal history of nicotine dependence: Secondary | ICD-10-CM | POA: Diagnosis not present

## 2016-11-29 DIAGNOSIS — R9389 Abnormal findings on diagnostic imaging of other specified body structures: Secondary | ICD-10-CM

## 2016-12-02 ENCOUNTER — Ambulatory Visit (INDEPENDENT_AMBULATORY_CARE_PROVIDER_SITE_OTHER): Payer: Medicare Other | Admitting: Family Medicine

## 2016-12-02 ENCOUNTER — Encounter: Payer: Self-pay | Admitting: Family Medicine

## 2016-12-02 ENCOUNTER — Encounter: Payer: Self-pay | Admitting: *Deleted

## 2016-12-02 VITALS — BP 102/70 | HR 100 | Temp 98.5°F | Resp 16 | Ht 69.0 in | Wt 170.9 lb

## 2016-12-02 DIAGNOSIS — R918 Other nonspecific abnormal finding of lung field: Secondary | ICD-10-CM | POA: Diagnosis not present

## 2016-12-02 DIAGNOSIS — E782 Mixed hyperlipidemia: Secondary | ICD-10-CM | POA: Diagnosis not present

## 2016-12-02 DIAGNOSIS — R8290 Unspecified abnormal findings in urine: Secondary | ICD-10-CM | POA: Diagnosis not present

## 2016-12-02 DIAGNOSIS — I10 Essential (primary) hypertension: Secondary | ICD-10-CM | POA: Diagnosis not present

## 2016-12-02 DIAGNOSIS — I700 Atherosclerosis of aorta: Secondary | ICD-10-CM

## 2016-12-02 DIAGNOSIS — Z87891 Personal history of nicotine dependence: Secondary | ICD-10-CM

## 2016-12-02 DIAGNOSIS — E119 Type 2 diabetes mellitus without complications: Secondary | ICD-10-CM

## 2016-12-02 DIAGNOSIS — J432 Centrilobular emphysema: Secondary | ICD-10-CM

## 2016-12-02 DIAGNOSIS — Z5181 Encounter for therapeutic drug level monitoring: Secondary | ICD-10-CM

## 2016-12-02 DIAGNOSIS — B351 Tinea unguium: Secondary | ICD-10-CM

## 2016-12-02 HISTORY — DX: Centrilobular emphysema: J43.2

## 2016-12-02 LAB — COMPLETE METABOLIC PANEL WITH GFR
ALT: 23 U/L (ref 6–29)
AST: 22 U/L (ref 10–35)
Albumin: 4.5 g/dL (ref 3.6–5.1)
Alkaline Phosphatase: 79 U/L (ref 33–130)
BUN: 17 mg/dL (ref 7–25)
CALCIUM: 9.3 mg/dL (ref 8.6–10.4)
CHLORIDE: 105 mmol/L (ref 98–110)
CO2: 26 mmol/L (ref 20–31)
CREATININE: 0.81 mg/dL (ref 0.50–0.99)
GFR, Est African American: 88 mL/min (ref >=60)
GFR, Est Non African American: 76 mL/min (ref >=60)
GLUCOSE: 109 mg/dL — AB (ref 65–99)
POTASSIUM: 4.2 mmol/L (ref 3.5–5.3)
SODIUM: 141 mmol/L (ref 135–146)
Total Bilirubin: 0.6 mg/dL (ref 0.2–1.2)
Total Protein: 6.9 g/dL (ref 6.1–8.1)

## 2016-12-02 LAB — LIPID PANEL
CHOLESTEROL: 149 mg/dL (ref <200)
HDL: 58 mg/dL (ref >50)
LDL Cholesterol: 69 mg/dL (ref <100)
Total CHOL/HDL Ratio: 2.6 Ratio (ref <5.0)
Triglycerides: 108 mg/dL (ref <150)
VLDL: 22 mg/dL (ref <30)

## 2016-12-02 MED ORDER — LISINOPRIL 5 MG PO TABS: 2.5000 mg | ORAL_TABLET | Freq: Every day | ORAL | 3 refills | Status: DC

## 2016-12-02 NOTE — Assessment & Plan Note (Signed)
Foot exam by MD today; okay to just check FSBS when desired; check A1c today

## 2016-12-02 NOTE — Assessment & Plan Note (Signed)
Excellent BP 

## 2016-12-02 NOTE — Assessment & Plan Note (Signed)
Benign behaviorl; next screen due in February 2019; avoid passive smoke; fire analogy discussed

## 2016-12-02 NOTE — Assessment & Plan Note (Signed)
Goal LDL < 70 and she has hit that target the last two checks; continue aspirin; avoidance of passive smoke

## 2016-12-02 NOTE — Assessment & Plan Note (Signed)
Not limiting activities; avoid second hand smoke

## 2016-12-02 NOTE — Assessment & Plan Note (Signed)
Yearly chest CT scans

## 2016-12-02 NOTE — Assessment & Plan Note (Signed)
Check labs 

## 2016-12-02 NOTE — Assessment & Plan Note (Signed)
Goal LDL less than 70; continue statin, lower saturated fats

## 2016-12-02 NOTE — Progress Notes (Signed)
BP 102/70 (BP Location: Left Arm, Patient Position: Sitting, Cuff Size: Normal)   Pulse 100   Temp 98.5 F (36.9 C) (Oral)   Resp 16   Ht 5\' 9"  (1.753 m)   Wt 170 lb 14.4 oz (77.5 kg)   SpO2 100%   BMI 25.24 kg/m    Subjective:    Patient ID: Felicia Cisneros, female    DOB: Sep 24, 1951, 67 y.o.   MRN: [id number]  HPI: Felicia Cisneros is a 66 y.o. female  Chief Complaint  Patient presents with  . Diabetes  . Follow-up    results of CT Scan   She has type 2 diabetes; check FSBS every day for the most part; morning sugars are about 105-119; sometimes 123, then back down; her feet hurt at times, but she has [employer] foot and leg cream and that helps; no calluses or sores or infections; had pedicure yesterday Last A1c 5.8 in August, prior was 6.1; 6.5 back in October 2016  She had another chest CT scan done; the first year, she was told everything was fine; the second year, she was told the old nodules were the same and one new nodule and she did not know about the nodules the first year Quit smoking five years ago  She went to see Dr. Rockey Situ and had EKG and was turned loose, she says; taking 81 mg aspirin LDL was 67 and 61 over the last 2 checks (one year) Tries to eat well  Moderate centrilobular emphysema; noted on CT scan; no limitations to activities  Chest CT dated Nov 29, 2016 CLINICAL DATA:  Followup of abnormal screening exam. Ex-smoker, quitting 5 years ago. 80 pack-year history.  EXAM: CT CHEST WITHOUT CONTRAST FOR LUNG CANCER SCREENING NODULE FOLLOW-UP  TECHNIQUE: Multidetector CT imaging of the chest was performed following the standard protocol without IV contrast.  COMPARISON:  08/26/2016  FINDINGS: Cardiovascular: Aortic and branch vessel atherosclerosis. Mild cardiomegaly, without pericardial effusion.  Mediastinum/Nodes: No mediastinal or definite hilar adenopathy, given limitations of unenhanced CT.  Lungs/Pleura: No pleural fluid. Moderate  centrilobular emphysema. Superior segment left lower lobe nodule of concern is not significantly enlarged. This measures volume derived equivalent diameter 5.5 mm today versus volume derived equivalent diameter 6.0 mm on the prior. Other smaller bilateral pulmonary nodules are not significantly changed.  Upper Abdomen: Normal imaged portions of the liver, spleen, stomach, pancreas, adrenal glands, kidneys.  Musculoskeletal: No acute osseous abnormality.  IMPRESSION: 1. Lung-RADS Category 2, benign appearance or behavior. Continue annual screening with low-dose chest CT without contrast in 12 months. Similar bilateral pulmonary nodules, including the nodule of concern in the superior segment left lower lobe. 2.  Aortic atherosclerosis.   Electronically Signed   By: Abigail Miyamoto M.D.   On: 11/29/2016 15:40   Depression screen [facility] 2/9 04/26/2016 02/27/2016 09/03/2015 08/21/2015  Decreased Interest 0 0 1 3  Down, Depressed, Hopeless 0 0 0 2  PHQ - 2 Score 0 0 1 5  Altered sleeping - - 3 3  Tired, decreased energy - - 0 0  Change in appetite - - 0 0  Feeling bad or failure about yourself  - - 0 0  Trouble concentrating - - 0 0  Moving slowly or fidgety/restless - - 0 0  Suicidal thoughts - - 0 0  PHQ-9 Score - - 4 8  Difficult doing work/chores - - Not difficult at all Not difficult at all    GAD 7 : Generalized Anxiety Score  08/21/2015  Nervous, Anxious, on Edge 0  Control/stop worrying 1  Worry too much - different things 1  Trouble relaxing 1  Restless 0  Easily annoyed or irritable 0  Afraid - awful might happen 3  Total GAD 7 Score 6  Anxiety Difficulty Not difficult at all    Relevant past medical, surgical, family and social history reviewed Past Medical History:  Diagnosis Date  . Atrophic vaginitis   . Diabetes mellitus without complication ([place])   . Diverticulitis   . Heartburn   . History of tobacco use   . Hyperlipidemia   . IFG (impaired fasting  glucose)   . Leukorrhea, not specified as infective   . Osteopenia 05/27/2016   August 2017  . Personal history of tobacco use, presenting hazards to health 08/20/2015  . Unspecified essential hypertension    Past Surgical History:  Procedure Laterality Date  . colonoscopy    . TMJ ARTHROPLASTY  1984   Family History  Problem Relation Age of Onset  . Stroke Mother   . Hypertension Mother   . Hyperlipidemia Mother   . Diabetes Mother     pre-diabetic  . Diabetes Brother   . Irregular heart beat Brother   . Thyroid disease Sister   . Stroke Maternal Grandmother   . Breast cancer Neg Hx    Social History  Substance Use Topics  . Smoking status: Former Smoker    Packs/day: 3.00    Years: 45.00    Types: Cigarettes    Quit date: 10/25/2009  . Smokeless tobacco: Never Used  . Alcohol use 0.0 oz/week     Comment: Rarely    Interim medical history since last visit reviewed. Allergies and medications reviewed  Review of Systems Per HPI unless specifically indicated above     Objective:    BP 102/70 (BP Location: Left Arm, Patient Position: Sitting, Cuff Size: Normal)   Pulse 100   Temp 98.5 F (36.9 C) (Oral)   Resp 16   Ht 5\' 9"  (1.753 m)   Wt 170 lb 14.4 oz (77.5 kg)   SpO2 100%   BMI 25.24 kg/m   Wt Readings from Last 3 Encounters:  12/02/16 170 lb 14.4 oz (77.5 kg)  09/01/16 165 lb (74.8 kg)  08/26/16 167 lb (75.8 kg)    Physical Exam  Constitutional: She appears well-developed and well-nourished. No distress.  HENT:  Head: Normocephalic and atraumatic.  Eyes: EOM are normal. No scleral icterus.  Neck: No thyromegaly present.  Cardiovascular: Normal rate, regular rhythm and normal heart sounds.   No murmur heard. Pulmonary/Chest: Effort normal and breath sounds normal. No respiratory distress. She has no wheezes.  Abdominal: Soft. She exhibits no distension.  Musculoskeletal: Normal range of motion. She exhibits no edema.  Neurological: She is alert. She  exhibits normal muscle tone.  Skin: Skin is warm and dry. She is not diaphoretic. No pallor.  Psychiatric: She has a normal mood and affect. Her behavior is normal. Judgment and thought content normal.  Briefly tearful when talking about her fears related to the chest CT, thinking about all she went through with her deceased husband, Vonna Drafts    Results for orders placed or performed during the hospital encounter of 09/01/16  Lipase, blood  Result Value Ref Range   Lipase 28 11 - 51 U/L  Comprehensive metabolic panel  Result Value Ref Range   Sodium 139 135 - 145 mmol/L   Potassium 4.2 3.5 - 5.1 mmol/L   Chloride  106 101 - 111 mmol/L   CO2 26 22 - 32 mmol/L   Glucose, Bld 102 (H) 65 - 99 mg/dL   BUN 15 6 - 20 mg/dL   Creatinine, Ser 0.75 0.44 - 1.00 mg/dL   Calcium 9.2 8.9 - 10.3 mg/dL   Total Protein 6.8 6.5 - 8.1 g/dL   Albumin 4.3 3.5 - 5.0 g/dL   AST 26 15 - 41 U/L   ALT 26 14 - 54 U/L   Alkaline Phosphatase 70 38 - 126 U/L   Total Bilirubin 0.4 0.3 - 1.2 mg/dL   GFR calc non Af Amer >60 >60 mL/min   GFR calc Af Amer >60 >60 mL/min   Anion gap 7 5 - 15  CBC  Result Value Ref Range   WBC 6.1 3.6 - 11.0 K/uL   RBC 5.04 3.80 - 5.20 MIL/uL   Hemoglobin 15.4 12.0 - 16.0 g/dL   HCT 45.9 35.0 - 47.0 %   MCV 91.1 80.0 - 100.0 fL   MCH 30.5 26.0 - 34.0 pg   MCHC 33.5 32.0 - 36.0 g/dL   RDW 13.7 11.5 - 14.5 %   Platelets 211 150 - 440 K/uL  Urinalysis complete, with microscopic  Result Value Ref Range   Color, Urine STRAW (A) YELLOW   APPearance CLEAR (A) CLEAR   Glucose, UA NEGATIVE NEGATIVE mg/dL   Bilirubin Urine NEGATIVE NEGATIVE   Ketones, ur NEGATIVE NEGATIVE mg/dL   Specific Gravity, Urine 1.009 1.005 - 1.030   Hgb urine dipstick 1+ (A) NEGATIVE   pH 7.0 5.0 - 8.0   Protein, ur NEGATIVE NEGATIVE mg/dL   Nitrite NEGATIVE NEGATIVE   Leukocytes, UA TRACE (A) NEGATIVE   RBC / HPF 0-5 0 - 5 RBC/hpf   WBC, UA 0-5 0 - 5 WBC/hpf   Bacteria, UA NONE SEEN NONE SEEN    Squamous Epithelial / LPF NONE SEEN NONE SEEN  Troponin I  Result Value Ref Range   Troponin I <0.03 <0.03 ng/mL  Troponin I  Result Value Ref Range   Troponin I <0.03 <0.03 ng/mL      Assessment & Plan:   Problem List Items Addressed This Visit      Cardiovascular and Mediastinum   Essential hypertension    Excellent BP      Relevant Medications   lisinopril (PRINIVIL,ZESTRIL) 5 MG tablet   Aortic atherosclerosis ([facility])    Goal LDL < 70 and she has hit that target the last two checks; continue aspirin; avoidance of passive smoke      Relevant Medications   lisinopril (PRINIVIL,ZESTRIL) 5 MG tablet     Respiratory   Centrilobular emphysema ([facility])    Not limiting activities; avoid second hand smoke        Endocrine   Type 2 diabetes mellitus, controlled ([place]) - Primary    Foot exam by MD today; okay to just check FSBS when desired; check A1c today      Relevant Medications   lisinopril (PRINIVIL,ZESTRIL) 5 MG tablet   Other Relevant Orders   Hemoglobin A1c     Other   Pulmonary nodules/lesions, multiple    Benign behaviorl; next screen due in February 2019; avoid passive smoke; fire analogy discussed      Medication monitoring encounter    Check labs      Relevant Orders   COMPLETE METABOLIC PANEL WITH GFR   Hyperlipidemia    Goal LDL less than 70; continue statin, lower saturated fats  Relevant Medications   lisinopril (PRINIVIL,ZESTRIL) 5 MG tablet   Other Relevant Orders   Lipid panel   History of tobacco use    Yearly chest CT scans       Other Visit Diagnoses    Abnormal urine finding       Relevant Orders   Urinalysis w microscopic + reflex cultur   Onychomycosis of right great toe       no oral meds; may try Vicks topically daily after shower/bath; may take six months to grow out      Patient did not think counseling or medicine indicated for her tearfulness; she just got worked up over fear about chest CT results  Follow up  plan: Return in about 6 months (around 06/01/2017) for diabetes and fasting labs.  An after-visit summary was printed and given to the patient at [place].  Please see the patient instructions which may contain other information and recommendations beyond what is mentioned above in the assessment and plan.  Meds ordered this encounter  Medications  . PREVIDENT 5000 PLUS 1.1 % CREA dental cream    Sig: APPLY WITH TOOTHBRUSH NIGHTLY.DO NOT RINSE. DO NOT SWALLOW.    Refill:  12  . lisinopril (PRINIVIL,ZESTRIL) 5 MG tablet    Sig: Take 0.5 tablets (2.5 mg total) by mouth daily. for blood pressure    Dispense:  45 tablet    Refill:  3    Lower dose    Orders Placed This Encounter  Procedures  . Hemoglobin A1c  . Lipid panel  . COMPLETE METABOLIC PANEL WITH GFR  . Urinalysis w microscopic + reflex cultur

## 2016-12-02 NOTE — Patient Instructions (Addendum)
Reduce the lisinopril to just half of a pill every day Check your blood pressure at home or at the pharmacy, or return here in 2 weeks and let us know if top number isn't staying below 140 (under 130 would be awesome) Try to limit saturated fats in your diet (bologna, hot dogs, barbeque, cheeseburgers, hamburgers, steak, bacon, sausage, cheese, etc.) and get more fresh fruits, vegetables, and whole grains Please do see your eye doctor regularly, and have your eyes examined every year (or more often per his or her recommendation) Check your feet every night and let me know right away of any sores, infections, numbness, etc. Try to limit sweets, white bread, white rice, white potatoes It is okay with me for you to not check your fingerstick blood sugars (per [employer] of Endocrinology Best Practices), unless you are interested and feel it would be helpful for you

## 2016-12-03 LAB — URINALYSIS W MICROSCOPIC + REFLEX CULTURE
BACTERIA UA: NONE SEEN [HPF]
BILIRUBIN URINE: NEGATIVE
CRYSTALS: NONE SEEN [HPF]
Casts: NONE SEEN [LPF]
Glucose, UA: NEGATIVE
Hgb urine dipstick: NEGATIVE
Ketones, ur: NEGATIVE
Leukocytes, UA: NEGATIVE
Nitrite: NEGATIVE
PROTEIN: NEGATIVE
Specific Gravity, Urine: 1.019 (ref 1.001–1.035)
Yeast: NONE SEEN [HPF]
pH: 6.5 (ref 5.0–8.0)

## 2016-12-03 LAB — HEMOGLOBIN A1C
Hgb A1c MFr Bld: 5.5 % (ref <5.7)
MEAN PLASMA GLUCOSE: 111 mg/dL

## 2016-12-07 ENCOUNTER — Other Ambulatory Visit: Payer: Self-pay | Admitting: Family Medicine

## 2016-12-07 DIAGNOSIS — R3129 Other microscopic hematuria: Secondary | ICD-10-CM

## 2016-12-07 NOTE — Progress Notes (Signed)
Referral entered to urologist

## 2016-12-13 ENCOUNTER — Encounter: Payer: Self-pay | Admitting: Urology

## 2016-12-13 ENCOUNTER — Ambulatory Visit: Payer: Medicare Other | Admitting: Urology

## 2016-12-13 VITALS — BP 121/75 | HR 77 | Ht 69.0 in | Wt 172.4 lb

## 2016-12-13 DIAGNOSIS — R3129 Other microscopic hematuria: Secondary | ICD-10-CM | POA: Diagnosis not present

## 2016-12-13 NOTE — Patient Instructions (Addendum)
Hematuria, Adult Hematuria is blood in your urine. It can be caused by a bladder infection, kidney infection, prostate infection, kidney stone, or cancer of your urinary tract. Infections can usually be treated with medicine, and a kidney stone usually will pass through your urine. If neither of these is the cause of your hematuria, further workup to find out the reason may be needed. It is very important that you tell your health care provider about any blood you see in your urine, even if the blood stops without treatment or happens without causing pain. Blood in your urine that happens and then stops and then happens again can be a symptom of a very serious condition. Also, pain is not a symptom in the initial stages of many urinary cancers. Follow these instructions at home:  Drink lots of fluid, 3-4 quarts a day. If you have been diagnosed with an infection, cranberry juice is especially recommended, in addition to large amounts of water.  Avoid caffeine, tea, and carbonated beverages because they tend to irritate the bladder.  Avoid alcohol because it may irritate the prostate.  Take all medicines as directed by your health care provider.  If you were prescribed an antibiotic medicine, finish it all even if you start to feel better.  If you have been diagnosed with a kidney stone, follow your health care provider's instructions regarding straining your urine to catch the stone.  Empty your bladder often. Avoid holding urine for long periods of time.  After a bowel movement, women should cleanse front to back. Use each tissue only once.  Empty your bladder before and after sexual intercourse if you are a female. Contact a health care provider if:  You develop back pain.  You have a fever.  You have a feeling of sickness in your stomach (nausea) or vomiting.  Your symptoms are not better in 3 days. Return sooner if you are getting worse. Get help right away if:  You develop  severe vomiting and are unable to keep the medicine down.  You develop severe back or abdominal pain despite taking your medicines.  You begin passing a large amount of blood or clots in your urine.  You feel extremely weak or faint, or you pass out. This information is not intended to replace advice given to you by your health care provider. Make sure you discuss any questions you have with your health care provider. Document Released: 10/11/2005 Document Revised: 03/18/2016 Document Reviewed: 06/11/2013 Elsevier Interactive Patient Education  2017 [employer].  Cystoscopy Cystoscopy is a procedure that is used to help diagnose and sometimes treat conditions that affect that lower urinary tract. The lower urinary tract includes the bladder and the tube that drains urine from the bladder out of the body (urethra). Cystoscopy is performed with a thin, tube-shaped instrument with a light and camera at the end (cystoscope). The cystoscope may be hard (rigid) or flexible, depending on the goal of the procedure.The cystoscope is inserted through the urethra, into the bladder. Cystoscopy may be recommended if you have:  Urinary tractinfections that keep coming back (recurring).  Blood in the urine (hematuria).  Loss of bladder control (urinary incontinence) or an overactive bladder.  Unusual cells found in a urine sample.  A blockage in the urethra.  Painful urination.  An abnormality in the bladder found during an intravenous pyelogram (IVP) or CT scan. Cystoscopy may also be done to remove a sample of tissue to be examined under a microscope (biopsy). Tell a  health care provider about:  Any allergies you have.  All medicines you are taking, including vitamins, herbs, eye drops, creams, and over-the-counter medicines.  Any problems you or family members have had with anesthetic medicines.  Any blood disorders you have.  Any surgeries you have had.  Any medical conditions you  have.  Whether you are pregnant or may be pregnant. What are the risks? Generally, this is a safe procedure. However, problems may occur, including:  Infection.  Bleeding.  Allergic reactions to medicines.  Damage to other structures or organs. What happens before the procedure?  Ask your health care provider about:  Changing or stopping your regular medicines. This is especially important if you are taking diabetes medicines or blood thinners.  Taking medicines such as aspirin and ibuprofen. These medicines can thin your blood. Do not take these medicines before your procedure if your health care provider instructs you not to.  Follow instructions from your health care provider about eating or drinking restrictions.  You may be given antibiotic medicine to help prevent infection.  You may have an exam or testing, such as X-rays of the bladder, urethra, or kidneys.  You may have urine tests to check for signs of infection.  Plan to have someone take you home after the procedure. What happens during the procedure?  To reduce your risk of infection,your health care team will wash or sanitize their hands.  You will be given one or more of the following:  A medicine to help you relax (sedative).  A medicine to numb the area (local anesthetic).  The area around the opening of your urethra will be cleaned.  The cystoscope will be passed through your urethra into your bladder.  Germ-free (sterile)fluid will flow through the cystoscope to fill your bladder. The fluid will stretch your bladder so that your surgeon can clearly examine your bladder walls.  The cystoscope will be removed and your bladder will be emptied. The procedure may vary among health care providers and hospitals. What happens after the procedure?  You may have some soreness or pain in your abdomen and urethra. Medicines will be available to help you.  You may have some blood in your urine.  Do not  drive for 24 hours if you received a sedative. This information is not intended to replace advice given to you by your health care provider. Make sure you discuss any questions you have with your health care provider. Document Released: 10/08/2000 Document Revised: 02/19/2016 Document Reviewed: 08/28/2015 Elsevier Interactive Patient Education  2017 [employer].

## 2016-12-13 NOTE — Progress Notes (Signed)
12/13/2016 3:15 PM   Felicia Cisneros 1951-04-28 [id number]  Referring provider: Arnetha Courser, MD [address] [place], [place] [address]  Chief Complaint  Patient presents with  . New Patient (Initial Visit)    microscopic hematuria     HPI: Patient is a 66 -year-old Caucasian female who presents today as a referral from their PCP, Dr. [name], for microscopic hematuria.    Patient was found to have microscopic hematuria on 11/29/2016 with 3-10 RBC's/hpf.  Patient doesn't have a prior history of microscopic hematuria.   She does not have a prior history of recurrent urinary tract infections, nephrolithiasis, trauma to the genitourinary tract or malignancies of the genitourinary tract.  She does not have a family medical history of nephrolithiasis, malignancies of the genitourinary tract or hematuria.   Today, she is having nocturia x 1.  This is baseline for her and not bothersome.   Her UA today is unremarkable.  She is not experiencing any suprapubic pain, abdominal pain or flank pain.  She denies any recent fevers, chills, nausea or vomiting.   She has not had any recent imaging studies.   She is a former smoker, with a 1 ppd history.  Quit 7 years ago.  She is not exposed to secondhand smoke.  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   PMH: Past Medical History:  Diagnosis Date  . Atrophic vaginitis   . Diabetes mellitus without complication ([place])   . Diverticulitis   . Heartburn   . History of tobacco use   . Hyperlipidemia   . IFG (impaired fasting glucose)   . Leukorrhea, not specified as infective   . Osteopenia 05/27/2016   August 2017  . Personal history of tobacco use, presenting hazards to health 08/20/2015  . Unspecified essential hypertension     Surgical History: Past Surgical History:  Procedure Laterality Date  . colonoscopy    . TMJ ARTHROPLASTY  1984    Home Medications:  Allergies as of 12/13/2016   No Known Allergies       Medication List       Accurate as of 12/13/16  3:15 PM. Always use your most recent med list.          accu-chek soft touch lancets Use as instructed   aspirin 81 MG tablet Take 81 mg by mouth daily.   CENTRUM SILVER ADULT 50+ PO Take 1 tablet by mouth daily.   CHEWABLE CALCIUM 500-200-40 MG-UNT-MCG Chew Generic drug:  Calcium-Vitamin D-Vitamin K Chew by mouth 2 (two) times daily.   CINNAMON PO Take 200 mg by mouth 3 (three) times daily.   Co Q 10 10 MG Caps Take 400 mg by mouth daily.   lisinopril 5 MG tablet Commonly known as:  PRINIVIL,ZESTRIL Take 0.5 tablets (2.5 mg total) by mouth daily. for blood pressure   metFORMIN 500 MG 24 hr tablet Commonly known as:  GLUCOPHAGE-XR Take 1 tablet (500 mg total) by mouth daily with breakfast.   ONETOUCH VERIO test strip Generic drug:  glucose blood CHECK FINGERSTICK BLOOD SUGARS ONCE A DAY   PREVIDENT 5000 PLUS 1.1 % Crea dental cream Generic drug:  sodium fluoride APPLY WITH TOOTHBRUSH NIGHTLY.DO NOT RINSE. DO NOT SWALLOW.   PROBIOTIC PO Take 1 tablet by mouth daily.   ranitidine 75 MG tablet Commonly known as:  ZANTAC Take 75 mg by mouth at bedtime.   ranitidine 150 MG capsule Commonly known as:  ZANTAC Take 2 capsules (300 mg  total) by mouth 2 (two) times daily.   simvastatin 10 MG tablet Commonly known as:  ZOCOR TAKE 1 TABLET BY MOUTH AT  BEDTIME   sucralfate 1 g tablet Commonly known as:  CARAFATE Take 1 tablet (1 g total) by mouth 4 (four) times daily.   Turmeric 500 MG Caps Take 300 mg by mouth.       Allergies: No Known Allergies  Family History: Family History  Problem Relation Age of Onset  . Stroke Mother   . Hypertension Mother   . Hyperlipidemia Mother   . Diabetes Mother     pre-diabetic  . Diabetes Brother   . Irregular heart beat Brother   . Thyroid disease Sister   . Stroke Maternal Grandmother   . Breast cancer Neg Hx   . Hematuria Neg Hx   . Prostate cancer Neg Hx   .  Renal cancer Neg Hx     Social History:  reports that she quit smoking about 7 years ago. Her smoking use included Cigarettes. She has a 135.00 pack-year smoking history. She has never used smokeless tobacco. She reports that she drinks alcohol. She reports that she does not use drugs.  ROS: UROLOGY Frequent Urination?: No Hard to postpone urination?: No Burning/pain with urination?: No Get up at night to urinate?: No Leakage of urine?: No Urine stream starts and stops?: No Trouble starting stream?: No Do you have to strain to urinate?: No Blood in urine?: Yes Urinary tract infection?: No Sexually transmitted disease?: No Injury to kidneys or bladder?: No Painful intercourse?: No Weak stream?: No Currently pregnant?: No Vaginal bleeding?: No Last menstrual period?: No  Gastrointestinal Nausea?: No Vomiting?: No Indigestion/heartburn?: Yes Diarrhea?: No Constipation?: No  Constitutional Fever: No Night sweats?: No Weight loss?: No Fatigue?: No  Skin Skin rash/lesions?: No Itching?: No  Eyes Blurred vision?: No Double vision?: No  Ears/Nose/Throat Sore throat?: No Sinus problems?: No  Hematologic/Lymphatic Swollen glands?: No Easy bruising?: No  Cardiovascular Leg swelling?: No Chest pain?: No  Respiratory Cough?: No Shortness of breath?: No  Endocrine Excessive thirst?: No  Musculoskeletal Back pain?: No Joint pain?: No  Neurological Headaches?: No Dizziness?: No  Psychologic Depression?: No Anxiety?: No  Physical Exam: BP 121/75   Pulse 77   Ht 5\' 9"  (1.753 m)   Wt 172 lb 6.4 oz (78.2 kg)   BMI 25.46 kg/m   Constitutional: Well nourished. Alert and oriented, No acute distress. HEENT: [place] AT, moist mucus membranes. Trachea midline, no masses. Cardiovascular: No clubbing, cyanosis, or edema. Respiratory: Normal respiratory effort, no increased work of breathing. GI: Abdomen is soft, non tender, non distended, no abdominal masses.  Liver and spleen not palpable.  No hernias appreciated.  Stool sample for occult testing is not indicated.   GU: No CVA tenderness.  No bladder fullness or masses.   Skin: No rashes, bruises or suspicious lesions. Lymph: No cervical or inguinal adenopathy. Neurologic: Grossly intact, no focal deficits, moving all 4 extremities. Psychiatric: Normal mood and affect.  Laboratory Data: Lab Results  Component Value Date   WBC 6.1 09/01/2016   HGB 15.4 09/01/2016   HCT 45.9 09/01/2016   MCV 91.1 09/01/2016   PLT 211 09/01/2016    Lab Results  Component Value Date   CREATININE 0.81 12/02/2016    Lab Results  Component Value Date   HGBA1C 5.5 12/02/2016       Component Value Date/Time   CHOL 149 12/02/2016 0845   CHOL 143 11/27/2015 0957  HDL 58 12/02/2016 0845   HDL 48 11/27/2015 0957   CHOLHDL 2.6 12/02/2016 0845   VLDL 22 12/02/2016 0845   LDLCALC 69 12/02/2016 0845   LDLCALC 67 11/27/2015 0957    Lab Results  Component Value Date   AST 22 12/02/2016   Lab Results  Component Value Date   ALT 23 12/02/2016    Urinalysis Unremarkable.  See EPIC.   Assessment & Plan:    1. Microscopic hematuria  - I explained to the patient that there are a number of causes that can be associated with blood in the urine, such as stones, UTI's, damage to the urinary tract and/or cancer.  - At this time, I felt that the patient warranted further urologic evaluation.   The AUA guidelines state that a CT urogram is the preferred imaging study to evaluate hematuria.  - I explained to the patient that a contrast material will be injected into a vein and that in rare instances, an allergic reaction can result and may even life threatening   The patient denies any allergies to contrast, iodine and/or seafood and is taking metformin.  - Her reproductive status is postmenopausal  - Following the imaging study,  I've recommended a cystoscopy. I described how this is performed, typically in an  office setting with a flexible cystoscope. We described the risks, benefits, and possible side effects, the most common of which is a minor amount of blood in the urine and/or burning which usually resolves in 24 to 48 hours.    - The patient had the opportunity to ask questions which were answered. Based upon this discussion, the patient is willing to proceed. Therefore, I've ordered: a CT Urogram and cystoscopy.  - The patient will return following all of the above for discussion of the results.   - UA  - Urine culture  - BUN + creatinine    Return for CT Urogram report and cystoscopy.  These notes generated with voice recognition software. I apologize for typographical errors.  Zara Council, [facility] [address] [place], [place] [address] [phone]

## 2016-12-14 LAB — URINALYSIS, COMPLETE
Bilirubin, UA: NEGATIVE
Glucose, UA: NEGATIVE
Ketones, UA: NEGATIVE
NITRITE UA: NEGATIVE
PH UA: 5 (ref 5.0–7.5)
PROTEIN UA: NEGATIVE
Specific Gravity, UA: 1.025 (ref 1.005–1.030)
UUROB: 0.2 mg/dL (ref 0.2–1.0)

## 2016-12-14 LAB — MICROSCOPIC EXAMINATION
Epithelial Cells (non renal): NONE SEEN /hpf (ref 0–10)
RBC MICROSCOPIC, UA: NONE SEEN /HPF (ref 0–?)

## 2016-12-15 ENCOUNTER — Other Ambulatory Visit: Payer: Medicare Other

## 2016-12-15 DIAGNOSIS — R3129 Other microscopic hematuria: Secondary | ICD-10-CM

## 2016-12-16 LAB — BUN+CREAT
BUN/Creatinine Ratio: 23 (ref 12–28)
BUN: 18 mg/dL (ref 8–27)
Creatinine, Ser: 0.77 mg/dL (ref 0.57–1.00)
GFR calc Af Amer: 94 (ref >59)
GFR, EST NON AFRICAN AMERICAN: 81 (ref >59)

## 2016-12-16 LAB — CULTURE, URINE COMPREHENSIVE

## 2016-12-23 ENCOUNTER — Encounter: Payer: Self-pay | Admitting: Urology

## 2016-12-23 ENCOUNTER — Encounter: Payer: Self-pay | Admitting: Family Medicine

## 2016-12-24 ENCOUNTER — Ambulatory Visit
Admission: RE | Admit: 2016-12-24 | Discharge: 2016-12-24 | Disposition: A | Discharge status: Home / Self Care | Payer: Medicare Other | Source: Ambulatory Visit | Attending: Urology | Admitting: Urology

## 2016-12-24 DIAGNOSIS — I700 Atherosclerosis of aorta: Secondary | ICD-10-CM | POA: Insufficient documentation

## 2016-12-24 DIAGNOSIS — R3129 Other microscopic hematuria: Secondary | ICD-10-CM

## 2016-12-24 DIAGNOSIS — N200 Calculus of kidney: Secondary | ICD-10-CM | POA: Diagnosis not present

## 2016-12-24 MED ORDER — IOPAMIDOL (ISOVUE-300) INJECTION 61%
125.0000 mL | Freq: Once | INTRAVENOUS | Status: AC | PRN
  Administered 2016-12-24: 125 mL via INTRAVENOUS

## 2016-12-28 ENCOUNTER — Encounter: Payer: Self-pay | Admitting: Family Medicine

## 2016-12-28 DIAGNOSIS — E119 Type 2 diabetes mellitus without complications: Secondary | ICD-10-CM

## 2016-12-28 MED ORDER — SIMVASTATIN 10 MG PO TABS: 10.0000 mg | ORAL_TABLET | Freq: Every day | ORAL | 0 refills | Status: DC

## 2016-12-28 MED ORDER — METFORMIN HCL ER 500 MG PO TB24: 500.0000 mg | ORAL_TABLET | Freq: Every day | ORAL | 0 refills | Status: DC

## 2016-12-28 MED ORDER — LISINOPRIL 5 MG PO TABS: 5.0000 mg | ORAL_TABLET | Freq: Every day | ORAL | 0 refills | Status: DC

## 2017-01-04 ENCOUNTER — Other Ambulatory Visit: Payer: Medicare Other

## 2017-01-11 ENCOUNTER — Ambulatory Visit: Payer: Medicare Other | Admitting: Urology

## 2017-01-11 ENCOUNTER — Encounter: Payer: Self-pay | Admitting: Urology

## 2017-01-11 VITALS — BP 123/78 | HR 73 | Ht 69.0 in | Wt 171.0 lb

## 2017-01-11 DIAGNOSIS — R3129 Other microscopic hematuria: Secondary | ICD-10-CM

## 2017-01-11 DIAGNOSIS — N200 Calculus of kidney: Secondary | ICD-10-CM

## 2017-01-11 DIAGNOSIS — N362 Urethral caruncle: Secondary | ICD-10-CM

## 2017-01-11 DIAGNOSIS — N329 Bladder disorder, unspecified: Secondary | ICD-10-CM

## 2017-01-11 MED ORDER — CIPROFLOXACIN HCL 500 MG PO TABS
500.0000 mg | ORAL_TABLET | Freq: Once | ORAL | Status: AC
  Administered 2017-01-11: 500 mg via ORAL

## 2017-01-11 MED ORDER — LIDOCAINE HCL 2 % EX GEL
1.0000 "application " | Freq: Once | CUTANEOUS | Status: AC
  Administered 2017-01-11: 1 via URETHRAL

## 2017-01-11 NOTE — Progress Notes (Signed)
   01/11/17  CC:  Chief Complaint  Patient presents with  . Cysto    HPI: 66 yo female who presents today for cystoscopy to complete her microscopic hematuria workup.  CT urogram shows a 2 mm right interpolar nonobstructing calculus, otherwise no other GU pathology.  She is a former smoker, with a 1 ppd history.  Quit 7 years ago.  She is not exposed to secondhand smoke.  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   Minimal urinary symptoms.    Blood pressure 123/78, pulse 73, height 5\' 9"  (1.753 m), weight 171 lb (77.6 kg). NED. A&Ox3.   No respiratory distress   Abd soft, NT, ND Normal external genitalia with patent urethral meatus  Cystoscopy Procedure Note  Patient identification was confirmed, informed consent was obtained, and patient was prepped using Betadine solution.  Lidocaine jelly was administered per urethral meatus.    Preoperative abx where received prior to procedure.     Procedure: - Flexible cystoscope introduced, without any difficulty.   - Thorough search of the bladder revealed:    + urethral caruncle    Small approximately 0.5 cm lesion posterior wall bladder just beyond the trigone Which is slightly raised, erythematous, with an anterior small clot, somewhat suspicious for underlying lesion although no papillary tumor appreciated    no stones    no ulcers     no tumors    no urethral polyps    no trabeculation  - Ureteral orifices were normal in position and appearance.  Post-Procedure: - Patient tolerated the procedure well  Assessment/ Plan:  1. Microscopic hematuria s/p cystoscopy and CT Urogram Findings as below - Urinalysis, Complete - ciprofloxacin (CIPRO) tablet 500 mg; Take 1 tablet (500 mg total) by mouth once. - lidocaine (XYLOCAINE) 2 % jelly 1 application; Place 1 application into the urethra once. 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 Right nephrolithiasis Incidental asymptomatic 2 mm right interpolar kidney stone Findings disclosed Encouraged hydration No intervention recommended, we will follow  4. Lesion of bladder Small suspicious lesion in the bladder I have recommended proceeding to the operating room for cystoscopy, bladder biopsy for further evaluation Risk minutes review today in detail including risk of bleeding, infection, bladder spasm, damage to surrounding structures, need for further procedures, amongst others were discussed in detail. All questions were answered. May continue ASA 81 mg given small size of lesion.  Schedule bladder biopsy   Hollice Espy, MD

## 2017-01-12 ENCOUNTER — Telehealth: Payer: Self-pay

## 2017-01-12 LAB — URINALYSIS, COMPLETE
Bilirubin, UA: NEGATIVE
GLUCOSE, UA: NEGATIVE
KETONES UA: NEGATIVE
Nitrite, UA: NEGATIVE
Protein, UA: NEGATIVE
Urobilinogen, Ur: 0.2 mg/dL (ref 0.2–1.0)
pH, UA: 5 (ref 5.0–7.5)

## 2017-01-12 LAB — MICROSCOPIC EXAMINATION

## 2017-01-12 NOTE — Telephone Encounter (Signed)
Patient notified that surgery has been scheduled for 01-17-17 and pre-admit will do a phone interview on 01-13-17 @ 1-5pm. Patient verbalized understanding and is in agreement with this plan

## 2017-01-13 ENCOUNTER — Encounter: Payer: Self-pay | Admitting: Family Medicine

## 2017-01-13 ENCOUNTER — Encounter
Admission: RE | Admit: 2017-01-13 | Discharge: 2017-01-13 | Disposition: A | Discharge status: Home / Self Care | Payer: Medicare Other | Source: Ambulatory Visit | Attending: Urology | Admitting: Urology

## 2017-01-13 DIAGNOSIS — H25013 Cortical age-related cataract, bilateral: Secondary | ICD-10-CM | POA: Diagnosis not present

## 2017-01-13 HISTORY — DX: Cardiac arrhythmia, unspecified: I49.9

## 2017-01-13 HISTORY — DX: Gastro-esophageal reflux disease without esophagitis: K21.9

## 2017-01-13 HISTORY — DX: Chronic obstructive pulmonary disease, unspecified: J44.9

## 2017-01-13 HISTORY — DX: Atherosclerosis of aorta: I70.0

## 2017-01-13 HISTORY — DX: Personal history of urinary calculi: Z87.442

## 2017-01-13 NOTE — Patient Instructions (Signed)
  Your procedure is scheduled on: 01-17-17 Report to Same Day Surgery 2nd floor medical mall [facility] Entrance-take elevator on left to 2nd floor.  Check in with surgery information desk.) To find out your arrival time please call [phone] between 1PM - 3PM on 01-14-17  Remember: Instructions that are not followed completely may result in serious medical risk, up to and including death, or upon the discretion of your surgeon and anesthesiologist your surgery may need to be rescheduled.    _x___ 1. Do not eat food or drink liquids after midnight. No gum chewing or hard candies.     __x__ 2. No Alcohol for 24 hours before or after surgery.   __x__3. No Smoking for 24 prior to surgery.   ____  4. Bring all medications with you on the day of surgery if instructed.    __x__ 5. Notify your doctor if there is any change in your medical condition     (cold, fever, infections).     Do not wear jewelry, make-up, hairpins, clips or nail polish.  Do not wear lotions, powders, or perfumes. You may wear deodorant.  Do not shave 48 hours prior to surgery. Men may shave face and neck.  Do not bring valuables to the hospital.    [facility] is not responsible for any belongings or valuables.               Contacts, dentures or bridgework may not be worn into surgery.  Leave your suitcase in the car. After surgery it may be brought to your room.  For patients admitted to the hospital, discharge time is determined by your treatment team.   Patients discharged the day of surgery will not be allowed to drive home.  You will need someone to drive you home and stay with you the night of your procedure.    Please read over the following fact sheets that you were given:    _x___ Take anti-hypertensive (unless it includes a diuretic), cardiac, seizure, asthma,     anti-reflux and psychiatric medicines. These include:  1. lisinopril   2. ranitidine  3. Take an extra ranitidine at bedtime on Sunday  night  4.  5.  6.  ____Fleets enema or Magnesium Citrate as directed.   ____ Use CHG Soap or sage wipes as directed on instruction sheet   ____ Use inhalers on the day of surgery and bring to hospital day of surgery  _X___ Stop Metformin and Janumet 2 days prior to surgery-LAST DOSE ON Friday, MARCH 23RD    ____ Take 1/2 of usual insulin dose the night before surgery and none on the morning     surgery.   ____ Follow recommendations from Cardiologist, Pulmonologist or PCP regarding stopping Aspirin, Coumadin, Pllavix ,Eliquis, Effient, or Pradaxa, and Pletal. MAY CONTINUE 81 MG ASPIRIN PER DR BRANDONS H&P  X____Stop Anti-inflammatories such as Advil, Aleve, Ibuprofen, Motrin, Naproxen, Naprosyn, Goodies powders or aspirin products NOW-OK to take Tylenol .   _x___ Stop supplements until after surgery-STOP CINNAMON, TURMERIC AND CO Q 10 NOW   ____ Bring C-Pap to the hospital.

## 2017-01-13 NOTE — Pre-Procedure Instructions (Signed)
EKG  Interpreted by me Normal sinus rhythm rate of 82, normal axis intervals. Incomplete right bundle-branch block. Poor R-wave progression in anterior precordial leads. Normal ST segments and T waves. No acute ischemic changes. No evidence of right heart strain.  ____________________________________________    RADIOLOGY  Chest x-ray unremarkable  ____________________________________________   PROCEDURES Procedures  ____________________________________________   INITIAL IMPRESSION / ASSESSMENT AND PLAN / ED COURSE  Pertinent labs & imaging results that were available during my care of the patient were reviewed by me and considered in my medical decision making (see chart for details).  Patient well appearing no acute distress, presents with atypical with upper quadrant abdominal pain.Considering the patient's symptoms, medical history, and physical examination today, I have low suspicion for ACS, PE, TAD, pneumothorax, carditis, mediastinitis, pneumonia, CHF, or sepsis. I have low suspicion for cholecystitis or biliary pathology, pancreatitis, perforation or bowel obstruction, hernia, intra-abdominal abscess, AAA or dissection, volvulus or intussusception, mesenteric ischemia, or appendicitis.  I think most likely with the pattern of her symptoms, this is probably due to gastritis and acid reflux. We'll try antacids while checking a second troponin. Patient is suitable for outpatient follow-up with her primary care doctor if second troponin is also unremarkable. Her home Zantac dose appears to be very low and not likely to be effective. I'll have her take a high dose of Zantac for a week to help control her symptoms.   Clinical Course    ____________________________________________   FINAL CLINICAL IMPRESSION(S) / ED DIAGNOSES  Final diagnoses:  Left upper quadrant pain       Portions of this note were generated with dragon dictation software.  Dictation errors may occur despite best attempts at proofreading.    Carrie Mew, MD 09/03/16 [phone]     Electronically signed by Carrie Mew, MD at 09/03/2016 7:08 AM      ED on 09/01/2016        Detailed Report

## 2017-01-13 NOTE — Pre-Procedure Instructions (Signed)
Progress Notes Encounter Date: 07/08/2016 Felicia Merritts, MD  Cardiology    [] Hide copied text Cardiology Office Note  Date:  07/08/2016   ID:  Felicia Cisneros, DOB 07-Oct-1951, MRN [id number]  PCP:  Felicia Derry, MD              Chief Complaint  Patient presents with  . other    Patient was seen last by Dr. Rockey Cisneros in 2014. Meds reviewed by the pt. verbally.  Pt. recently diagnosed with Type II diabetes and was told by Dr. Sanda Cisneros need to follow up with her cardiologist.      HPI:  Felicia Cisneros is a very pleasant 66 year old woman with long history of smoking, Hyperlipidemia Previous symptoms of dizziness and numbness on prior evaluation in 2014, osteoporosis who presents by referral from Dr. Sanda Cisneros for type 2 diabetes, risk stratification  She reports that she is doing well, active Denies any symptoms of chest pain or shortness of breath on exertion  stopped smoking approximately 5 years ago, did smoke on and off for 40 years Trying to watch her diet, hemoglobin A1c 5.8 Does work out at the recreation center, tone and fit class   Other lab work reviewed  Total chol 142, LDL 61  CT chest 2016: reviewed with her in detail, images pulled up in the office  No significant coronary ca Mild diffuse aortic atherosclerosis Seen in the aortic arch, descending aorta    main complaint is her Toes hurt, chronic Issue, etiology unclear   significant stress at home, lost of 2 family members this past year Husband had cancer  EKG on today's visit shows normal sinus rhythm with rate 74 bpm, unable to exclude old inferior MI. No change from previous EKG years ago  Other past medical history  On 07/10/2013 she ate lunch and during her meal developed numbness all over, dizziness that lasted for approximately one hour. He denies any nausea, vomiting, diarrhea. Her husband took her to the emergency room. She had lab work, chest x-ray, EKG, head CT scan. Potassium was 3.3, negative  troponin.  scan of the head was essentially normal.  She has osteopenia noted on the DEXA, Hypertension, Type 2 diabetes; hyperlipidemia,   PMH:   has a past medical history of Atrophic vaginitis; Diabetes mellitus without complication (Felicia Cisneros); Heartburn; History of tobacco use; Hyperlipidemia; IFG (impaired fasting glucose); Leukorrhea, not specified as infective; Osteopenia (05/27/2016); Personal history of tobacco use, presenting hazards to health (08/20/2015); and Unspecified essential hypertension.  PSH:         Past Surgical History:  Procedure Laterality Date  . colonoscopy    . TMJ ARTHROPLASTY  1984          Current Outpatient Prescriptions  Medication Sig Dispense Refill  . aspirin 81 MG tablet Take 81 mg by mouth daily.    . Calcium-Vitamin D-Vitamin K (CHEWABLE CALCIUM) 500-200-40 MG-UNT-MCG CHEW Chew by mouth 2 (two) times daily.    Marland Kitchen CINNAMON PO Take 200 mg by mouth 3 (three) times daily.     . Coenzyme Q10 (CO Q 10) 10 MG CAPS Take 400 mg by mouth daily.    . Lancets (ACCU-CHEK SOFT TOUCH) lancets Use as instructed 100 each 12  . lisinopril (PRINIVIL,ZESTRIL) 5 MG tablet Take 1 tablet (5 mg total) by mouth daily. For blood pressure 90 tablet 1  . metFORMIN (GLUCOPHAGE-XR) 500 MG 24 hr tablet Take 1 tablet (500 mg total) by mouth daily with breakfast. 30 tablet 6  .  Multiple Vitamins-Minerals (CENTRUM SILVER ADULT 50+ PO) Take 1 tablet by mouth daily.     Felicia Cisneros VERIO test strip CHECK FINGERSTICK BLOOD SUGARS ONCE A DAY 100 each 1  . Probiotic Product (PROBIOTIC PO) Take 1 tablet by mouth daily.    . ranitidine (ZANTAC) 75 MG tablet Take 75 mg by mouth at bedtime.    . simvastatin (ZOCOR) 10 MG tablet Take 1 tablet (10 mg total) by mouth at bedtime. 30 tablet 6  . Turmeric 500 MG CAPS Take 300 mg by mouth.     No current facility-administered medications for this visit.      Allergies:   Review of patient's allergies indicates no known  allergies.   Social History:  The patient  reports that she quit smoking about 6 years ago. Her smoking use included Cigarettes. She has a 135.00 pack-year smoking history. She has never used smokeless tobacco. She reports that she drinks alcohol. She reports that she does not use drugs.   Family History:   family history includes Diabetes in her brother and mother; Hyperlipidemia in her mother; Hypertension in her mother; Irregular heart beat in her brother; Stroke in her maternal grandmother and mother; Thyroid disease in her sister.    Review of Systems: Review of Systems  Constitutional: Negative.   Respiratory: Negative.   Cardiovascular: Negative.   Gastrointestinal: Negative.   Musculoskeletal: Negative.        Pain in her toes  Neurological: Negative.   Psychiatric/Behavioral: Negative.   All other systems reviewed and are negative.    PHYSICAL EXAM: VS:  BP 110/64 (BP Location: Left Arm, Patient Position: Sitting, Cuff Size: Normal)   Pulse 74   Ht 5\' 8"  (1.727 m)   Wt 172 lb 8 oz (78.2 kg)   BMI 26.23 kg/m  , BMI Body mass index is 26.23 kg/m. GEN: Well nourished, well developed, in no acute distress  HEENT: normal  Neck: no JVD, carotid bruits, or masses Cardiac: RRR; no murmurs, rubs, or gallops,no edema  Respiratory:  clear to auscultation bilaterally, normal work of breathing GI: soft, nontender, nondistended, + BS MS: no deformity or atrophy  Skin: warm and dry, no rash Neuro:  Strength and sensation are intact Psych: euthymic mood, full affect    Recent Labs: 08/22/2015: Platelets 229 05/31/2016: ALT 16 06/16/2016: BUN 22; Creat 0.74; Potassium 4.7; Sodium 143    Lipid Panel      Lab Results  Component Value Date   CHOL 142 05/31/2016   HDL 53 05/31/2016   LDLCALC 61 05/31/2016   TRIG 142 05/31/2016         Wt Readings from Last 3 Encounters:  07/08/16 172 lb 8 oz (78.2 kg)  05/31/16 171 lb (77.6 kg)  04/26/16 172 lb 6.4 oz  (78.2 kg)       ASSESSMENT AND PLAN:  Hyperlipidemia Cholesterol is at goal on the current lipid regimen. No changes to the medications were made.  History of tobacco use Stopped smoking 5 years ago, denies significant shortness of breath on exertion CT scan 2016 for lung screening  Controlled type 2 diabetes mellitus with other circulatory complication, without long-term current use of insulin (HCC) A1c 5.8 We have encouraged continued exercise, careful diet management  Aortic atherosclerosis (HCC)  images reviewed with her,  mild aortic atherosclerosis, no significant coronary calcifications noted . discussed with her in detail  Likely secondary to prior smoking history, hyperlipidemia   now all of her risk factors are well  controlled No further workup at this time as she is asymptomatic   Total encounter time more than 45 minutes  Greater than 50% was spent in counseling and coordination of care with the patient   Disposition:   F/U  12 months      Orders Placed This Encounter  Procedures  . EKG 12-Lead     Signed, Esmond Plants, M.D., Ph.D. 07/08/2016  [place]     Electronically signed by Felicia Merritts, MD at 07/08/2016 2:58 PM      Office Visit on 07/08/2016        Detailed Report

## 2017-01-13 NOTE — Telephone Encounter (Signed)
Please update list; thank you

## 2017-01-14 LAB — CULTURE, URINE COMPREHENSIVE

## 2017-01-14 LAB — HM DIABETES EYE EXAM

## 2017-01-16 ENCOUNTER — Telehealth: Payer: Self-pay | Admitting: Urology

## 2017-01-16 MED ORDER — SULFAMETHOXAZOLE-TRIMETHOPRIM 800-160 MG PO TABS: 1.0000 | ORAL_TABLET | Freq: Two times a day (BID) | ORAL | 0 refills | Status: DC

## 2017-01-16 MED ORDER — CEFAZOLIN SODIUM-DEXTROSE 2-4 GM/100ML-% IV SOLN
2.0000 g | Freq: Once | INTRAVENOUS | Status: AC
  Administered 2017-01-17: 2 g via INTRAVENOUS

## 2017-01-16 NOTE — Telephone Encounter (Signed)
Currently asymptomatic with extremely low colony count of Escherichia coli bacteria, 4000 units. In the setting of upcoming surgery, we'll go ahead and she was 3 days of Bactrim. This is called into her pharmacy.  Left message left on machine.  Hollice Espy, MD

## 2017-01-17 ENCOUNTER — Encounter
Admission: RE | Disposition: A | Discharge status: Home / Self Care | Payer: Self-pay | Source: Ambulatory Visit | Attending: Urology

## 2017-01-17 ENCOUNTER — Ambulatory Visit
Admission: RE | Admit: 2017-01-17 | Discharge: 2017-01-17 | Disposition: A | Discharge status: Home / Self Care | Payer: Medicare Other | Source: Ambulatory Visit | Attending: Urology | Admitting: Urology

## 2017-01-17 ENCOUNTER — Ambulatory Visit: Payer: Medicare Other | Admitting: Anesthesiology

## 2017-01-17 DIAGNOSIS — N189 Chronic kidney disease, unspecified: Secondary | ICD-10-CM | POA: Diagnosis not present

## 2017-01-17 DIAGNOSIS — N3289 Other specified disorders of bladder: Secondary | ICD-10-CM | POA: Insufficient documentation

## 2017-01-17 DIAGNOSIS — Z87891 Personal history of nicotine dependence: Secondary | ICD-10-CM | POA: Diagnosis not present

## 2017-01-17 DIAGNOSIS — I10 Essential (primary) hypertension: Secondary | ICD-10-CM | POA: Diagnosis not present

## 2017-01-17 DIAGNOSIS — D494 Neoplasm of unspecified behavior of bladder: Secondary | ICD-10-CM | POA: Diagnosis not present

## 2017-01-17 DIAGNOSIS — I739 Peripheral vascular disease, unspecified: Secondary | ICD-10-CM | POA: Diagnosis not present

## 2017-01-17 DIAGNOSIS — Z79899 Other long term (current) drug therapy: Secondary | ICD-10-CM | POA: Insufficient documentation

## 2017-01-17 DIAGNOSIS — Z7982 Long term (current) use of aspirin: Secondary | ICD-10-CM | POA: Insufficient documentation

## 2017-01-17 DIAGNOSIS — J449 Chronic obstructive pulmonary disease, unspecified: Secondary | ICD-10-CM | POA: Diagnosis not present

## 2017-01-17 DIAGNOSIS — E119 Type 2 diabetes mellitus without complications: Secondary | ICD-10-CM | POA: Insufficient documentation

## 2017-01-17 DIAGNOSIS — Z7984 Long term (current) use of oral hypoglycemic drugs: Secondary | ICD-10-CM | POA: Insufficient documentation

## 2017-01-17 DIAGNOSIS — N3081 Other cystitis with hematuria: Secondary | ICD-10-CM | POA: Insufficient documentation

## 2017-01-17 DIAGNOSIS — I129 Hypertensive chronic kidney disease with stage 1 through stage 4 chronic kidney disease, or unspecified chronic kidney disease: Secondary | ICD-10-CM | POA: Diagnosis not present

## 2017-01-17 DIAGNOSIS — N362 Urethral caruncle: Secondary | ICD-10-CM | POA: Insufficient documentation

## 2017-01-17 DIAGNOSIS — R311 Benign essential microscopic hematuria: Secondary | ICD-10-CM | POA: Diagnosis not present

## 2017-01-17 DIAGNOSIS — D414 Neoplasm of uncertain behavior of bladder: Secondary | ICD-10-CM | POA: Diagnosis not present

## 2017-01-17 DIAGNOSIS — R3129 Other microscopic hematuria: Secondary | ICD-10-CM | POA: Diagnosis present

## 2017-01-17 DIAGNOSIS — D303 Benign neoplasm of bladder: Secondary | ICD-10-CM | POA: Diagnosis not present

## 2017-01-17 HISTORY — PX: CYSTOSCOPY WITH BIOPSY: SHX5122

## 2017-01-17 LAB — GLUCOSE, CAPILLARY
GLUCOSE-CAPILLARY: 102 mg/dL — AB (ref 65–99)
GLUCOSE-CAPILLARY: 113 mg/dL — AB (ref 65–99)

## 2017-01-17 SURGERY — CYSTOSCOPY, WITH BIOPSY
Anesthesia: General | Site: Bladder | Wound class: Clean Contaminated

## 2017-01-17 MED ORDER — GLYCOPYRROLATE 0.2 MG/ML IJ SOLN
INTRAMUSCULAR | Status: AC
  Filled 2017-01-17: qty 1

## 2017-01-17 MED ORDER — DEXAMETHASONE SODIUM PHOSPHATE 10 MG/ML IJ SOLN
INTRAMUSCULAR | Status: AC
  Filled 2017-01-17: qty 1

## 2017-01-17 MED ORDER — PHENYLEPHRINE HCL 10 MG/ML IJ SOLN
INTRAMUSCULAR | Status: DC | PRN
  Administered 2017-01-17: 200 ug via INTRAVENOUS
  Administered 2017-01-17: 100 ug via INTRAVENOUS

## 2017-01-17 MED ORDER — FENTANYL CITRATE (PF) 100 MCG/2ML IJ SOLN
INTRAMUSCULAR | Status: AC
  Filled 2017-01-17: qty 2

## 2017-01-17 MED ORDER — OXYBUTYNIN CHLORIDE 5 MG PO TABS: 5.0000 mg | ORAL_TABLET | Freq: Three times a day (TID) | ORAL | 0 refills | Status: DC | PRN

## 2017-01-17 MED ORDER — MIDAZOLAM HCL 2 MG/2ML IJ SOLN
INTRAMUSCULAR | Status: DC | PRN
  Administered 2017-01-17: 2 mg via INTRAVENOUS

## 2017-01-17 MED ORDER — FENTANYL CITRATE (PF) 100 MCG/2ML IJ SOLN
INTRAMUSCULAR | Status: AC
  Administered 2017-01-17: 25 ug via INTRAVENOUS
  Filled 2017-01-17: qty 2

## 2017-01-17 MED ORDER — FENTANYL CITRATE (PF) 100 MCG/2ML IJ SOLN
25.0000 ug | INTRAMUSCULAR | Status: DC | PRN
  Administered 2017-01-17: 25 ug via INTRAVENOUS

## 2017-01-17 MED ORDER — PROPOFOL 10 MG/ML IV BOLUS
INTRAVENOUS | Status: AC
  Filled 2017-01-17: qty 20

## 2017-01-17 MED ORDER — MIDAZOLAM HCL 2 MG/2ML IJ SOLN
INTRAMUSCULAR | Status: AC
  Filled 2017-01-17: qty 2

## 2017-01-17 MED ORDER — LIDOCAINE HCL (CARDIAC) 20 MG/ML IV SOLN
INTRAVENOUS | Status: DC | PRN
  Administered 2017-01-17: 100 mg via INTRAVENOUS

## 2017-01-17 MED ORDER — EPHEDRINE SULFATE 50 MG/ML IJ SOLN
INTRAMUSCULAR | Status: AC
  Filled 2017-01-17: qty 1

## 2017-01-17 MED ORDER — ONDANSETRON HCL 4 MG/2ML IJ SOLN
INTRAMUSCULAR | Status: AC
  Filled 2017-01-17: qty 2

## 2017-01-17 MED ORDER — LIDOCAINE HCL (PF) 2 % IJ SOLN
INTRAMUSCULAR | Status: AC
  Filled 2017-01-17: qty 2

## 2017-01-17 MED ORDER — DEXAMETHASONE SODIUM PHOSPHATE 10 MG/ML IJ SOLN
INTRAMUSCULAR | Status: DC | PRN
  Administered 2017-01-17: 10 mg via INTRAVENOUS

## 2017-01-17 MED ORDER — ONDANSETRON HCL 4 MG/2ML IJ SOLN
INTRAMUSCULAR | Status: DC | PRN
  Administered 2017-01-17: 4 mg via INTRAVENOUS

## 2017-01-17 MED ORDER — ONDANSETRON HCL 4 MG/2ML IJ SOLN: 4.0000 mg | Freq: Once | INTRAMUSCULAR | Status: DC | PRN

## 2017-01-17 MED ORDER — FENTANYL CITRATE (PF) 100 MCG/2ML IJ SOLN
INTRAMUSCULAR | Status: DC | PRN
  Administered 2017-01-17: 25 ug via INTRAVENOUS

## 2017-01-17 MED ORDER — PROPOFOL 10 MG/ML IV BOLUS
INTRAVENOUS | Status: DC | PRN
  Administered 2017-01-17: 200 mg via INTRAVENOUS

## 2017-01-17 MED ORDER — HYDROCODONE-ACETAMINOPHEN 5-325 MG PO TABS: 1.0000 | ORAL_TABLET | Freq: Four times a day (QID) | ORAL | 0 refills | Status: DC | PRN

## 2017-01-17 MED ORDER — SODIUM CHLORIDE 0.9 % IV SOLN
INTRAVENOUS | Status: DC
  Administered 2017-01-17: 11:00:00 via INTRAVENOUS

## 2017-01-17 MED ORDER — CEFAZOLIN SODIUM-DEXTROSE 2-4 GM/100ML-% IV SOLN
INTRAVENOUS | Status: AC
  Administered 2017-01-17: 2 g via INTRAVENOUS
  Filled 2017-01-17: qty 100

## 2017-01-17 SURGICAL SUPPLY — 16 items
BAG DRAIN CYSTO-URO LG1000N (MISCELLANEOUS) ×2 IMPLANT
DRSG TELFA 4X3 1S NADH ST (GAUZE/BANDAGES/DRESSINGS) ×2 IMPLANT
ELECT REM PT RETURN 9FT ADLT (ELECTROSURGICAL) ×2
ELECTRODE REM PT RTRN 9FT ADLT (ELECTROSURGICAL) ×1 IMPLANT
GLOVE BIO SURGEON STRL SZ 6.5 (GLOVE) ×2 IMPLANT
GOWN STRL REUS W/ TWL LRG LVL3 (GOWN DISPOSABLE) ×2 IMPLANT
GOWN STRL REUS W/TWL LRG LVL3 (GOWN DISPOSABLE) ×2
KIT RM TURNOVER CYSTO AR (KITS) ×2 IMPLANT
NDL SAFETY ECLIPSE 18X1.5 (NEEDLE) IMPLANT
NEEDLE HYPO 18GX1.5 SHARP (NEEDLE)
PACK CYSTO AR (MISCELLANEOUS) ×2 IMPLANT
SCRUB POVIDONE IODINE 4 OZ (MISCELLANEOUS) ×2 IMPLANT
SET CYSTO W/LG BORE CLAMP LF (SET/KITS/TRAYS/PACK) ×2 IMPLANT
SURGILUBE 2OZ TUBE FLIPTOP (MISCELLANEOUS) ×2 IMPLANT
WATER STERILE IRR 1000ML POUR (IV SOLUTION) ×2 IMPLANT
WATER STERILE IRR 3000ML UROMA (IV SOLUTION) ×2 IMPLANT

## 2017-01-17 NOTE — Transfer of Care (Signed)
Immediate Anesthesia Transfer of Care Note  Patient: EMIRETH COCKERHAM  Procedure(s) Performed: Procedure(s): CYSTOSCOPY WITH BIOPSY (N/A)  Patient Location: PACU  Anesthesia Type:General  Level of Consciousness: unresponsive  Airway & Oxygen Therapy: Patient Spontanous Breathing and Patient connected to face mask oxygen  Post-op Assessment: Report given to RN and Post -op Vital signs reviewed and stable  Post vital signs: Reviewed and stable  Last Vitals:  Vitals:   01/17/17 1039 01/17/17 1322  BP: 137/70 (!) 107/55  Pulse:  71  Resp: 16 16  Temp: 36.4 C 36.6 C    Last Pain:  Vitals:   01/17/17 1322  TempSrc: Temporal  PainSc: Asleep         Complications: No apparent anesthesia complications, slow to wake up.

## 2017-01-17 NOTE — Anesthesia Procedure Notes (Signed)
Procedure Name: LMA Insertion Date/Time: 01/17/2017 12:47 PM Performed by: Johnna Acosta Pre-anesthesia Checklist: Patient identified, Emergency Drugs available, Suction available, Patient being monitored and Timeout performed Patient Re-evaluated:Patient Re-evaluated prior to inductionOxygen Delivery Method: Circle system utilized Preoxygenation: Pre-oxygenation with 100% oxygen Intubation Type: IV induction LMA: LMA inserted LMA Size: 4.0 Tube type: Oral Number of attempts: 1 Placement Confirmation: positive ETCO2 and breath sounds checked- equal and bilateral Tube secured with: Tape Dental Injury: Teeth and Oropharynx as per pre-operative assessment

## 2017-01-17 NOTE — Anesthesia Postprocedure Evaluation (Signed)
Anesthesia Post Note  Patient: Felicia Cisneros  Procedure(s) Performed: Procedure(s) (LRB): CYSTOSCOPY WITH BIOPSY (N/A)  Patient location during evaluation: PACU Anesthesia Type: General Level of consciousness: awake and alert Pain management: pain level controlled Vital Signs Assessment: post-procedure vital signs reviewed and stable Respiratory status: spontaneous breathing, nonlabored ventilation, respiratory function stable and patient connected to nasal cannula oxygen Cardiovascular status: blood pressure returned to baseline and stable Postop Assessment: no signs of nausea or vomiting Anesthetic complications: no     Last Vitals:  Vitals:   01/17/17 1352 01/17/17 1406  BP: 117/64 (!) 118/59  Pulse: 64 63  Resp: 15 18  Temp: 36.8 C 36.4 C    Last Pain:  Vitals:   01/17/17 1406  TempSrc: Temporal  PainSc: 4                  Martha Clan

## 2017-01-17 NOTE — Anesthesia Preprocedure Evaluation (Signed)
Anesthesia Evaluation  Patient identified by MRN, date of birth, ID band Patient awake    Reviewed: Allergy & Precautions, H&P , NPO status , Patient's Chart, lab work & pertinent test results, reviewed documented beta blocker date and time   History of Anesthesia Complications Negative for: history of anesthetic complications  Airway Mallampati: I  TM Distance: >3 FB Neck ROM: full    Dental  (+) Caps, Missing   Pulmonary neg shortness of breath, neg sleep apnea, COPD, neg recent URI, former smoker,           Cardiovascular Exercise Tolerance: Good hypertension, (-) angina+ Peripheral Vascular Disease  (-) CAD, (-) Past MI, (-) Cardiac Stents and (-) CABG (-) dysrhythmias (-) Valvular Problems/Murmurs     Neuro/Psych negative neurological ROS  negative psych ROS   GI/Hepatic Neg liver ROS, GERD  ,  Endo/Other  diabetes, Oral Hypoglycemic Agents  Renal/GU Renal disease (kidney stone)  negative genitourinary   Musculoskeletal   Abdominal   Peds  Hematology negative hematology ROS (+)   Anesthesia Other Findings Past Medical History: No date: Aortic atherosclerosis (HCC) No date: Atrophic vaginitis No date: COPD (chronic obstructive pulmonary disease) (*     Comment: PT STATES SHE WAS TOLD THIS AFTER HAVING CT               SCAN No date: Diabetes mellitus without complication (HCC) No date: Diverticulitis No date: GERD (gastroesophageal reflux disease) No date: Heartburn No date: History of kidney stones No date: History of tobacco use No date: Hyperlipidemia No date: IFG (impaired fasting glucose) 2018: Irregular heart beat     Comment: WAS RECENTLY TOLD THIS IN DR Audree Bane               OFFICE-PT UNAWARE OF THIS WHEN IT HAPPENS No date: Leukorrhea, not specified as infective 05/27/2016: Osteopenia     Comment: August 2017 08/20/2015: Personal history of tobacco use, presenting ha* No date: Unspecified  essential hypertension   Reproductive/Obstetrics negative OB ROS                             Anesthesia Physical Anesthesia Plan  ASA: III  Anesthesia Plan: General   Post-op Pain Management:    Induction:   Airway Management Planned:   Additional Equipment:   Intra-op Plan:   Post-operative Plan:   Informed Consent: I have reviewed the patients History and Physical, chart, labs and discussed the procedure including the risks, benefits and alternatives for the proposed anesthesia with the patient or authorized representative who has indicated his/her understanding and acceptance.   Dental Advisory Given  Plan Discussed with: Anesthesiologist, CRNA and Surgeon  Anesthesia Plan Comments:         Anesthesia Quick Evaluation

## 2017-01-17 NOTE — Op Note (Signed)
Date of procedure: 01/17/17  Preoperative diagnosis:  1. Bladder lesion 2. Microscopic hematuria   Postoperative diagnosis:  1. Same as above   Procedure: 1. Cystoscopy 2. Bladder biopsy  Surgeon: Hollice Espy, MD  Anesthesia: General  Complications: None  Intraoperative findings: Approximately 0.5 cm lesion on the posterior bladder wall just beyond the trigone, erythematous with a hypervascular appearance.  EBL: Minimal  Specimens: Bladder lesion  Drains: None  Indication: Felicia Cisneros is a 66 y.o. patient with microscopic hematuria found to have a suspicious appearing lesion within the bladder.  After reviewing the management options for treatment, she elected to proceed with the above surgical procedure(s). We have discussed the potential benefits and risks of the procedure, side effects of the proposed treatment, the likelihood of the patient achieving the goals of the procedure, and any potential problems that might occur during the procedure or recuperation. Informed consent has been obtained.  Description of procedure:  The patient was taken to the operating room and general anesthesia was induced.  The patient was placed in the dorsal lithotomy position, prepped and draped in the usual sterile fashion, and preoperative antibiotics were administered. A preoperative time-out was performed.   A 21 French cystoscope was advanced per urethra into the bladder. Attention was turned to the posterior bladder wall just beyond the trigone in the midline and oriented approximately 0.5 cm lesion was identified which was hypervascular and appearance and slightly raised. There is no obvious papillary tumor. The remainder of the bladder was completely unremarkable. The bilateral UOs were identified and noted to have clear reflux of urine. There were no other lesions, ulcerations, tumors, or stones within the bladder. At this point in time, cold cup biopsy forceps were used to take a  single biopsy which completely resected the lesion. This was passed off the field. Using water as the medium, Bugbee electrocautery was used to fulgurate the base of the lesion.  Once adequate hemostasis was achieved, the bladder is empty. She was cleaned and dried, repositioned supine position, reversed from anesthesia, taken the PACU in stable condition.  Plan: We'll call the patient with her pathology results discussed by telephone. We will develop a follow plan as needed basis of his results.  Hollice Espy, M.D.

## 2017-01-17 NOTE — Interval H&P Note (Signed)
History and Physical Interval Note:  01/17/2017 12:27 PM  Felicia Cisneros  has presented today for surgery, with the diagnosis of BLADDER NEOPLASM UNCERTAIN  The various methods of treatment have been discussed with the patient and family. After consideration of risks, benefits and other options for treatment, the patient has consented to  Procedure(s): CYSTOSCOPY WITH BIOPSY (N/A) as a surgical intervention .  The patient's history has been reviewed, patient examined, no change in status, stable for surgery.  I have reviewed the patient's chart and labs.  Questions were answered to the patient's satisfaction.    RRR CTAB  UCx 4000 E. Coli with negative UA,  asymptomatic OK to proceed.   Hollice Espy

## 2017-01-17 NOTE — Discharge Instructions (Addendum)
Transurethral Resection of Bladder Tumor (TURBT) or Bladder Biopsy ° ° °Definition: ° Transurethral Resection of the Bladder Tumor is a surgical procedure used to diagnose and remove tumors within the bladder. TURBT is the most common treatment for early stage bladder cancer. ° °General instructions: °   ° Your recent bladder surgery requires very little post hospital care but some definite precautions. ° °Despite the fact that no skin incisions were used, the area around the bladder incisions are raw and covered with scabs to promote healing and prevent bleeding. Certain precautions are needed to insure that the scabs are not disturbed over the next 2-4 weeks while the healing proceeds. ° °Because the raw surface inside your bladder and the irritating effects of urine you may expect frequency of urination and/or urgency (a stronger desire to urinate) and perhaps even getting up at night more often. This will usually resolve or improve slowly over the healing period. You may see some blood in your urine over the first 6 weeks. Do not be alarmed, even if the urine was clear for a while. Get off your feet and drink lots of fluids until clearing occurs. If you start to pass clots or don't improve call us. ° °Diet: ° °You may return to your normal diet immediately. Because of the raw surface of your bladder, alcohol, spicy foods, foods high in acid and drinks with caffeine may cause irritation or frequency and should be used in moderation. To keep your urine flowing freely and avoid constipation, drink plenty of fluids during the day (8-10 glasses). Tip: Avoid cranberry juice because it is very acidic. ° °Activity: ° °Your physical activity doesn't need to be restricted. However, if you are very active, you may see some blood in the urine. We suggest that you reduce your activity under the circumstances until the bleeding has stopped. ° °Bowels: ° °It is important to keep your bowels regular during the postoperative  period. Straining with bowel movements can cause bleeding. A bowel movement every other day is reasonable. Use a mild laxative if needed, such as milk of magnesia 2-3 tablespoons, or 2 Dulcolax tablets. Call if you continue to have problems. If you had been taking narcotics for pain, before, during or after your surgery, you may be constipated. Take a laxative if necessary. ° ° ° °Medication: ° °You should resume your pre-surgery medications unless told not to. In addition you may be given an antibiotic to prevent or treat infection. Antibiotics are not always necessary. All medication should be taken as prescribed until the bottles are finished unless you are having an unusual reaction to one of the drugs. ° ° °[facility] °[address], [address] °[place], [place] [address] °[phone] ° ° °AMBULATORY SURGERY  °DISCHARGE INSTRUCTIONS ° ° °1) The drugs that you were given will stay in your system until tomorrow so for the next 24 hours you should not: ° °A) Drive an automobile °B) Make any legal decisions °C) Drink any alcoholic beverage ° ° °2) You may resume regular meals tomorrow.  Today it is better to start with liquids and gradually work up to solid foods. ° °You may eat anything you prefer, but it is better to start with liquids, then soup and crackers, and gradually work up to solid foods. ° ° °3) Please notify your doctor immediately if you have any unusual bleeding, trouble breathing, redness and pain at the surgery site, drainage, fever, or pain not relieved by medication. ° ° ° °4)   Additional Instructions: ° ° ° ° ° ° ° °Please contact your physician with any problems or Same Day Surgery at [phone], Monday through Friday 6 am to 4 pm, or [place] [facility] [place] Main number at [phone]. ° ° ° ° °

## 2017-01-17 NOTE — Anesthesia Post-op Follow-up Note (Cosign Needed)
Anesthesia QCDR form completed.        

## 2017-01-17 NOTE — OR Nursing (Signed)
Dr Erlene Quan at bedside to assess pt c/o "boil" to left side.  Dr. Erlene Quan marked the edges and advised patient if redness and swelling stretches beyond the markings patient should seek further assessment from her primary care physician.  Patient states she has hx of same without having further treatment "it just goes away on its own".  Patient with verbal understanding of Dr. Cherrie Gauze advise.

## 2017-01-17 NOTE — H&P (View-Only) (Signed)
   01/11/17  CC:  Chief Complaint  Patient presents with  . Cysto    HPI: 66 yo female who presents today for cystoscopy to complete her microscopic hematuria workup.  CT urogram shows a 2 mm right interpolar nonobstructing calculus, otherwise no other GU pathology.  She is a former smoker, with a 1 ppd history.  Quit 7 years ago.  She is not exposed to secondhand smoke.  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   Minimal urinary symptoms.    Blood pressure 123/78, pulse 73, height 5\' 9"  (1.753 m), weight 171 lb (77.6 kg). NED. A&Ox3.   No respiratory distress   Abd soft, NT, ND Normal external genitalia with patent urethral meatus  Cystoscopy Procedure Note  Patient identification was confirmed, informed consent was obtained, and patient was prepped using Betadine solution.  Lidocaine jelly was administered per urethral meatus.    Preoperative abx where received prior to procedure.     Procedure: - Flexible cystoscope introduced, without any difficulty.   - Thorough search of the bladder revealed:    + urethral caruncle    Small approximately 0.5 cm lesion posterior wall bladder just beyond the trigone Which is slightly raised, erythematous, with an anterior small clot, somewhat suspicious for underlying lesion although no papillary tumor appreciated    no stones    no ulcers     no tumors    no urethral polyps    no trabeculation  - Ureteral orifices were normal in position and appearance.  Post-Procedure: - Patient tolerated the procedure well  Assessment/ Plan:  1. Microscopic hematuria s/p cystoscopy and CT Urogram Findings as below - Urinalysis, Complete - ciprofloxacin (CIPRO) tablet 500 mg; Take 1 tablet (500 mg total) by mouth once. - lidocaine (XYLOCAINE) 2 % jelly 1 application; Place 1 application into the urethra once. 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 Right nephrolithiasis Incidental asymptomatic 2 mm right interpolar kidney stone Findings disclosed Encouraged hydration No intervention recommended, we will follow  4. Lesion of bladder Small suspicious lesion in the bladder I have recommended proceeding to the operating room for cystoscopy, bladder biopsy for further evaluation Risk minutes review today in detail including risk of bleeding, infection, bladder spasm, damage to surrounding structures, need for further procedures, amongst others were discussed in detail. All questions were answered. May continue ASA 81 mg given small size of lesion.  Schedule bladder biopsy   Hollice Espy, MD

## 2017-01-17 NOTE — OR Nursing (Signed)
Patient presents today to [facility], patient has what she calls a "boil" on her left side around waist line, area of "boil" including red area measures approx 7 cm.  Will notify Dr. Erlene Quan

## 2017-01-18 ENCOUNTER — Encounter: Payer: Self-pay | Admitting: Urology

## 2017-01-18 ENCOUNTER — Telehealth: Payer: Self-pay | Admitting: Urology

## 2017-01-18 LAB — SURGICAL PATHOLOGY

## 2017-01-18 NOTE — Telephone Encounter (Signed)
Call patient to review pathology results. 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 She may follow-up when necessary. All questions answered.  Hollice Espy, MD

## 2017-02-15 ENCOUNTER — Other Ambulatory Visit: Payer: Self-pay | Admitting: Family Medicine

## 2017-02-15 DIAGNOSIS — E119 Type 2 diabetes mellitus without complications: Secondary | ICD-10-CM

## 2017-02-15 NOTE — Telephone Encounter (Signed)
FEb labs reviewed; Rxs approved

## 2017-04-01 ENCOUNTER — Emergency Department: Payer: Medicare Other

## 2017-04-01 ENCOUNTER — Emergency Department
Admission: EM | Admit: 2017-04-01 | Discharge: 2017-04-01 | Disposition: A | Discharge status: Home / Self Care | Payer: Medicare Other | Attending: Emergency Medicine | Admitting: Emergency Medicine

## 2017-04-01 ENCOUNTER — Encounter: Payer: Self-pay | Admitting: Emergency Medicine

## 2017-04-01 DIAGNOSIS — J449 Chronic obstructive pulmonary disease, unspecified: Secondary | ICD-10-CM | POA: Diagnosis not present

## 2017-04-01 DIAGNOSIS — E119 Type 2 diabetes mellitus without complications: Secondary | ICD-10-CM | POA: Diagnosis not present

## 2017-04-01 DIAGNOSIS — R0789 Other chest pain: Secondary | ICD-10-CM | POA: Diagnosis not present

## 2017-04-01 DIAGNOSIS — Z7982 Long term (current) use of aspirin: Secondary | ICD-10-CM | POA: Diagnosis not present

## 2017-04-01 DIAGNOSIS — Z79899 Other long term (current) drug therapy: Secondary | ICD-10-CM | POA: Diagnosis not present

## 2017-04-01 DIAGNOSIS — Z7984 Long term (current) use of oral hypoglycemic drugs: Secondary | ICD-10-CM | POA: Insufficient documentation

## 2017-04-01 DIAGNOSIS — R079 Chest pain, unspecified: Secondary | ICD-10-CM | POA: Diagnosis not present

## 2017-04-01 DIAGNOSIS — Z87891 Personal history of nicotine dependence: Secondary | ICD-10-CM | POA: Insufficient documentation

## 2017-04-01 LAB — BASIC METABOLIC PANEL
ANION GAP: 8 (ref 5–15)
BUN: 20 mg/dL (ref 6–20)
CHLORIDE: 105 mmol/L (ref 101–111)
CO2: 25 mmol/L (ref 22–32)
Calcium: 9.5 mg/dL (ref 8.9–10.3)
Creatinine, Ser: 0.59 mg/dL (ref 0.44–1.00)
GFR calc Af Amer: >60 mL/min (ref >60)
GLUCOSE: 105 mg/dL — AB (ref 65–99)
Potassium: 3.9 mmol/L (ref 3.5–5.1)
Sodium: 138 mmol/L (ref 135–145)

## 2017-04-01 LAB — CBC
HEMATOCRIT: 45.3 % (ref 35.0–47.0)
HEMOGLOBIN: 15.2 g/dL (ref 12.0–16.0)
MCH: 30.7 pg (ref 26.0–34.0)
MCHC: 33.5 g/dL (ref 32.0–36.0)
MCV: 91.5 fL (ref 80.0–100.0)
Platelets: 205 10*3/uL (ref 150–440)
RBC: 4.95 MIL/uL (ref 3.80–5.20)
RDW: 13.5 % (ref 11.5–14.5)
WBC: 6.4 10*3/uL (ref 3.6–11.0)

## 2017-04-01 LAB — TROPONIN I: Troponin I: <0.03 ng/mL (ref <0.03)

## 2017-04-01 MED ORDER — PIPERACILLIN-TAZOBACTAM 3.375 G IVPB 30 MIN: 3.3750 g | Freq: Once | INTRAVENOUS | Status: DC

## 2017-04-01 MED ORDER — VANCOMYCIN HCL IN DEXTROSE 1-5 GM/200ML-% IV SOLN: 1000.0000 mg | Freq: Once | INTRAVENOUS | Status: DC

## 2017-04-01 MED ORDER — SODIUM CHLORIDE 0.9 % IV BOLUS (SEPSIS): 30.0000 mL/kg | Freq: Once | INTRAVENOUS | Status: DC

## 2017-04-01 NOTE — ED Triage Notes (Signed)
Patient presents to the ED for intermittent chest pain x 2 days.  Patient denies any associated symptoms with the chest pain.  Reports the first time pain occurred it was on the right side and since then the pains have been on the left side.  Patient states first pain was sharp, but pain since then have been uncomfortable.

## 2017-04-01 NOTE — ED Notes (Signed)
Pt updated on delay. Pt rolling her eyes, states 'i've been here since four o clock".

## 2017-04-01 NOTE — ED Notes (Signed)
First Nurse Note:  Arrives via EMS for c/o chest tightness x 1 day.  ASA 324 mg given by EMS.  CBG:  94.  Vitals 108/60  P:  63  R:  16  Sats:  98

## 2017-04-01 NOTE — ED Notes (Signed)
Pt states chest pain that began two days pta. Pt states initially had right sided stabbing chest pain with right arm numbness 2 days pta that abated yesterday. Pt states after resolution of right sided chest pain she began to have left sided stabbing chest pain with numbness in bilateral arms. Pt denies shob, nausea, vomiting, cough, fever, diaphoresis, syncope, dizziness, near syncope. Pt states pain was worse with inspiration. Pt denies pain currently. Pt denies pain radiation to arms, shoulder, neck, jaw, back, abd.

## 2017-04-01 NOTE — Discharge Instructions (Signed)
Please follow-up with your primary care physician as needed. Return to the emergency department for any new or worsening symptoms such as worsening pain, if you cannot eat or drink, or for any other concerns.  It was a pleasure to take care of you today, and thank you for coming to our emergency department.  If you have any questions or concerns before leaving please ask the nurse to grab me and I'm more than happy to go through your aftercare instructions again.  If you were prescribed any opioid pain medication today such as Norco, Vicodin, Percocet, morphine, hydrocodone, or oxycodone please make sure you do not drive when you are taking this medication as it can alter your ability to drive safely.  If you have any concerns once you are home that you are not improving or are in fact getting worse before you can make it to your follow-up appointment, please do not hesitate to call 911 and come back for further evaluation.  Darel Hong MD  Results for orders placed or performed during the hospital encounter of 19/14/78  Basic metabolic panel  Result Value Ref Range   Sodium 138 135 - 145 mmol/L   Potassium 3.9 3.5 - 5.1 mmol/L   Chloride 105 101 - 111 mmol/L   CO2 25 22 - 32 mmol/L   Glucose, Bld 105 (H) 65 - 99 mg/dL   BUN 20 6 - 20 mg/dL   Creatinine, Ser 0.59 0.44 - 1.00 mg/dL   Calcium 9.5 8.9 - 10.3 mg/dL   GFR calc non Af Amer >60 >60 mL/min   GFR calc Af Amer >60 >60 mL/min   Anion gap 8 5 - 15  CBC  Result Value Ref Range   WBC 6.4 3.6 - 11.0 K/uL   RBC 4.95 3.80 - 5.20 MIL/uL   Hemoglobin 15.2 12.0 - 16.0 g/dL   HCT 45.3 35.0 - 47.0 %   MCV 91.5 80.0 - 100.0 fL   MCH 30.7 26.0 - 34.0 pg   MCHC 33.5 32.0 - 36.0 g/dL   RDW 13.5 11.5 - 14.5 %   Platelets 205 150 - 440 K/uL  Troponin I  Result Value Ref Range   Troponin I <0.03 <0.03 ng/mL   Dg Chest 2 View  Result Date: 04/01/2017 CLINICAL DATA:  Chest pain for 2 days. EXAM: CHEST  2 VIEW COMPARISON:  CT of the chest  11/29/2016. FINDINGS: The heart size is upper limits of normal. There is no edema or effusion to suggest failure. No focal airspace disease present. Emphysema is again noted. Aortic atherosclerosis is present. The visualized soft tissues and bony thorax are unremarkable. IMPRESSION: 1. No acute cardiopulmonary disease. 2. Borderline cardiomegaly without failure. 3. Centrilobular emphysema. Electronically Signed   By: San Morelle M.D.   On: 04/01/2017 17:47
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
## 2017-04-04 ENCOUNTER — Encounter: Payer: Self-pay | Admitting: Family Medicine

## 2017-04-04 NOTE — Telephone Encounter (Signed)
Staff - please check on this; it looks like she is in need of her PCV-13 vaccine I see that she got the PPSV-23 vaccine in Feb 2017; it has been at least a year, so she can get the PCV-13 vaccine if she has not already gotten it Please check on this, then respond back to patient if due for PCV-13 She will need a PPSV-23 booster on or after February 2022 (five years later since she got the PPSV-23 before age 66 Thank you

## 2017-05-02 ENCOUNTER — Encounter: Payer: Medicare Other | Admitting: Family Medicine

## 2017-05-11 ENCOUNTER — Encounter: Payer: Self-pay | Admitting: Family Medicine

## 2017-05-12 ENCOUNTER — Encounter: Payer: Self-pay | Admitting: Family Medicine

## 2017-05-12 NOTE — Telephone Encounter (Signed)
See next note; patient canceled request for Rx

## 2017-05-16 ENCOUNTER — Other Ambulatory Visit: Payer: Self-pay | Admitting: Family Medicine

## 2017-05-16 DIAGNOSIS — Z1231 Encounter for screening mammogram for malignant neoplasm of breast: Secondary | ICD-10-CM

## 2017-06-02 ENCOUNTER — Encounter: Payer: Medicare Other | Admitting: Family Medicine

## 2017-06-06 ENCOUNTER — Ambulatory Visit
Admission: RE | Admit: 2017-06-06 | Discharge: 2017-06-06 | Disposition: A | Discharge status: Home / Self Care | Payer: Medicare Other | Source: Ambulatory Visit | Attending: Family Medicine | Admitting: Family Medicine

## 2017-06-06 DIAGNOSIS — Z1231 Encounter for screening mammogram for malignant neoplasm of breast: Secondary | ICD-10-CM | POA: Insufficient documentation

## 2017-06-06 LAB — HM MAMMOGRAPHY

## 2017-06-09 ENCOUNTER — Encounter: Payer: Self-pay | Admitting: Family Medicine

## 2017-06-09 ENCOUNTER — Ambulatory Visit (INDEPENDENT_AMBULATORY_CARE_PROVIDER_SITE_OTHER): Payer: Medicare Other | Admitting: Family Medicine

## 2017-06-09 VITALS — BP 122/78 | HR 88 | Temp 97.8°F | Resp 16 | Ht 68.0 in | Wt 173.5 lb

## 2017-06-09 DIAGNOSIS — Z0000 Encounter for general adult medical examination without abnormal findings: Secondary | ICD-10-CM

## 2017-06-09 DIAGNOSIS — Z66 Do not resuscitate: Secondary | ICD-10-CM

## 2017-06-09 DIAGNOSIS — Z23 Encounter for immunization: Secondary | ICD-10-CM

## 2017-06-09 DIAGNOSIS — Z5181 Encounter for therapeutic drug level monitoring: Secondary | ICD-10-CM | POA: Diagnosis not present

## 2017-06-09 DIAGNOSIS — E782 Mixed hyperlipidemia: Secondary | ICD-10-CM | POA: Diagnosis not present

## 2017-06-09 DIAGNOSIS — I10 Essential (primary) hypertension: Secondary | ICD-10-CM

## 2017-06-09 DIAGNOSIS — E1159 Type 2 diabetes mellitus with other circulatory complications: Secondary | ICD-10-CM

## 2017-06-09 DIAGNOSIS — Z87891 Personal history of nicotine dependence: Secondary | ICD-10-CM

## 2017-06-09 DIAGNOSIS — I700 Atherosclerosis of aorta: Secondary | ICD-10-CM

## 2017-06-09 HISTORY — DX: Do not resuscitate: Z66

## 2017-06-09 NOTE — Progress Notes (Signed)
Patient: Felicia Cisneros, Female    DOB: 1951-02-19, 66 y.o.   MRN: [id number]  Visit Date: 06/09/2017  Today's Provider: Enid Derry, MD   Chief Complaint  Patient presents with  . Medicare Wellness    Subjective:   Felicia Cisneros is a 66 y.o. female who presents today for her Subsequent Annual Wellness Visit.  Caregiver input:  N/a  Three questions: 1. When is next chest CT 2. Chewing sugar-free gum; last few sugars have been in 120 to 130 range 3. Metformin is bad she read; I advised her I am not aware of any worries  USPSTF grade A and B recommendations Depression:  Depression screen [facility] 2/9 06/09/2017 04/26/2016 02/27/2016 09/03/2015 08/21/2015  Decreased Interest 0 0 0 1 3  Down, Depressed, Hopeless 0 0 0 0 2  PHQ - 2 Score 0 0 0 1 5  Altered sleeping - - - 3 3  Tired, decreased energy - - - 0 0  Change in appetite - - - 0 0  Feeling bad or failure about yourself  - - - 0 0  Trouble concentrating - - - 0 0  Moving slowly or fidgety/restless - - - 0 0  Suicidal thoughts - - - 0 0  PHQ-9 Score - - - 4 8  Difficult doing work/chores - - - Not difficult at all Not difficult at all   Hypertension: BP Readings from Last 3 Encounters:  06/09/17 122/78  04/01/17 134/74  01/17/17 132/65   Obesity: Wt Readings from Last 3 Encounters:  06/09/17 173 lb 8 oz (78.7 kg)  04/01/17 170 lb (77.1 kg)  01/17/17 171 lb (77.6 kg)   BMI Readings from Last 3 Encounters:  06/09/17 26.38 kg/m  04/01/17 25.10 kg/m  01/17/17 25.25 kg/m    Alcohol: drinks 1 glass of wine 1x a week Tobacco use: quit 5 years ago HIV, hep B, hep C: UTD STD testing and prevention (chl/gon/syphilis): already done, now new partners Intimate partner violence: no abuse Breast cancer: no lumps; had mammo this week Cervical cancer screening: over 65 yo Osteoporosis: August 2017 Fall prevention/vitamin D: discussed, taking vit D in multiple vitamin; drinking 2% milk Lipids:  Lab Results  Component Value  Date   CHOL 149 12/02/2016   CHOL 142 05/31/2016   CHOL 143 11/27/2015   Lab Results  Component Value Date   HDL 58 12/02/2016   HDL 53 05/31/2016   HDL 48 11/27/2015   Lab Results  Component Value Date   LDLCALC 69 12/02/2016   LDLCALC 61 05/31/2016   LDLCALC 67 11/27/2015   Lab Results  Component Value Date   TRIG 108 12/02/2016   TRIG 142 05/31/2016   TRIG 138 11/27/2015   Lab Results  Component Value Date   CHOLHDL 2.6 12/02/2016   CHOLHDL 2.7 05/31/2016   No results found for: LDLDIRECT Glucose:  Glucose  Date Value Ref Range Status  07/10/2013 132 (H) 65 - 99 mg/dL Final   Glucose, Bld  Date Value Ref Range Status  04/01/2017 105 (H) 65 - 99 mg/dL Final  12/02/2016 109 (H) 65 - 99 mg/dL Final  09/01/2016 102 (H) 65 - 99 mg/dL Final   Glucose-Capillary  Date Value Ref Range Status  01/17/2017 113 (H) 65 - 99 mg/dL Final  01/17/2017 102 (H) 65 - 99 mg/dL Final   Colorectal cancer:  Lung cancer:  Getting chest CT through [facility] Nov 29, 2016 chest CT LCS nodule follow-up w/o contrast IMPRESSION: 1. Lung-RADS  Category 2, benign appearance or behavior. Continue annual screening with low-dose chest CT without contrast in 12 months. Similar bilateral pulmonary nodules, including the nodule of concern in the superior segment left lower lobe. 2.  Aortic atherosclerosis.  Electronically Signed   By: Abigail Miyamoto M.D.   On: 11/29/2016 15:40  AAA: no fam hx Aspirin: taking 81 mg daily Diet: drinking 2%; 1/2 serving of oatmeal daily, eats out a lot, takes other half home when dining out; less beef; could do better; salads a lot Exercise: twice a week at [employer] Skin cancer: goes once a year to Dr. Tami Lin to see Dr. Rockey Situ in a week  HPI  Review of Systems  Past Medical History:  Diagnosis Date  . Aortic atherosclerosis ([place])   . Atrophic vaginitis   . COPD (chronic obstructive pulmonary disease) (HCC)    PT STATES SHE WAS TOLD  THIS AFTER HAVING CT SCAN  . Diabetes mellitus without complication ([place])   . Diverticulitis   . DNAR (do not attempt resuscitation) 06/09/2017   Discussed today, 06/09/2017  . GERD (gastroesophageal reflux disease)   . Heartburn   . History of kidney stones   . History of tobacco use   . Hyperlipidemia   . IFG (impaired fasting glucose)   . Irregular heart beat 2018   WAS RECENTLY TOLD THIS IN DR Audree Bane OFFICE-PT UNAWARE OF THIS WHEN IT HAPPENS  . Leukorrhea, not specified as infective   . Osteopenia 05/27/2016   August 2017  . Personal history of tobacco use, presenting hazards to health 08/20/2015  . Unspecified essential hypertension     Past Surgical History:  Procedure Laterality Date  . colonoscopy    . CYSTOSCOPY WITH BIOPSY N/A 01/17/2017   Procedure: CYSTOSCOPY WITH BIOPSY;  Surgeon: Hollice Espy, MD;  Location: [facility] ORS;  Service: Urology;  Laterality: N/A;  . TMJ ARTHROPLASTY  1984    Family History  Problem Relation Age of Onset  . Stroke Mother   . Hypertension Mother   . Hyperlipidemia Mother   . Diabetes Mother        pre-diabetic  . Diabetes Brother   . Irregular heart beat Brother   . Thyroid disease Sister   . Stroke Maternal Grandmother   . Breast cancer Neg Hx   . Hematuria Neg Hx   . Prostate cancer Neg Hx   . Renal cancer Neg Hx     Social History   Social History  . Marital status: Widowed    Spouse name: N/A  . Number of children: N/A  . Years of education: N/A   Occupational History  . Not on file.   Social History Main Topics  . Smoking status: Former Smoker    Packs/day: 3.00    Years: 45.00    Types: Cigarettes    Quit date: 10/25/2009  . Smokeless tobacco: Never Used  . Alcohol use 0.0 oz/week     Comment: Rarely  . Drug use: No  . Sexual activity: Not Currently   Other Topics Concern  . Not on file   Social History Narrative  . No narrative on file    Outpatient Encounter Prescriptions as of 06/09/2017  Medication  Sig Note  . acetaminophen (TYLENOL) 500 MG tablet Take 1,000 mg by mouth every 6 (six) hours as needed (for pain.).   Marland Kitchen aspirin EC 81 MG tablet Take 81 mg by mouth at bedtime.   Marland Kitchen CINNAMON PO Take 200 mg by mouth 2 (  two) times daily.    . Coenzyme Q10 (COQ10) 400 MG CAPS Take 400 mg by mouth daily.   . Lancets (ACCU-CHEK SOFT TOUCH) lancets Use as instructed (Patient taking differently: 1 each by Other route 3 (three) times a week. Use as instructed)   . lisinopril (PRINIVIL,ZESTRIL) 5 MG tablet TAKE 1 TABLET BY MOUTH  DAILY FOR BLOOD PRESSURE   . metFORMIN (GLUCOPHAGE-XR) 500 MG 24 hr tablet TAKE 1 TABLET BY MOUTH  DAILY WITH BREAKFAST   . Multiple Vitamin (MULTIVITAMIN WITH MINERALS) TABS tablet Take 1 tablet by mouth daily. CENTRUM SILVER FOR ADULTS 50+   . ONETOUCH VERIO test strip CHECK FINGERSTICK BLOOD SUGARS ONCE A DAY (Patient taking differently: CHECK FINGERSTICK BLOOD SUGARS THREE TIMES A WEEK)   . PREVIDENT 5000 PLUS 1.1 % CREA dental cream Place 1 application onto teeth 2 (two) times daily.   . Probiotic Product (TRUBIOTICS) CAPS Take 1 capsule by mouth daily.   . ranitidine (ZANTAC) 150 MG tablet Take 150 mg by mouth daily.    . TURMERIC PO Take 300 mg by mouth daily.   . Calcium-Vitamin D-Vitamin K (CHEWABLE CALCIUM) 500-200-40 MG-UNT-MCG CHEW Chew 1 tablet by mouth 2 (two) times a week.    . simvastatin (ZOCOR) 10 MG tablet TAKE 1 TABLET BY MOUTH AT  BEDTIME   . [DISCONTINUED] HYDROcodone-acetaminophen (NORCO/VICODIN) 5-325 MG tablet Take 1-2 tablets by mouth every 6 (six) hours as needed for moderate pain. (Patient not taking: Reported on 06/09/2017)   . [DISCONTINUED] oxybutynin (DITROPAN) 5 MG tablet Take 1 tablet (5 mg total) by mouth every 8 (eight) hours as needed for bladder spasms. (Patient not taking: Reported on 06/09/2017)   . [DISCONTINUED] ranitidine (ZANTAC) 150 MG capsule Take 2 capsules (300 mg total) by mouth 2 (two) times daily. (Patient not taking: Reported on  7/56/4332) 9/51/8841: Duplicate   . [DISCONTINUED] sucralfate (CARAFATE) 1 g tablet Take 1 tablet (1 g total) by mouth 4 (four) times daily. (Patient not taking: Reported on 12/02/2016)    No facility-administered encounter medications on file as of 06/09/2017.     Functional Ability / Safety Screening 1.  Was the timed Get Up and Go test longer than 30 seconds?  no 2.  Does the patient need help with the phone, transportation, shopping,      preparing meals, housework, laundry, medications, or managing money?  no 3.  Does the patient's home have:  loose throw rugs in the hallway?   yes      Grab bars in the bathroom? yes      Handrails on the stairs?   yes      Poor lighting?   no, old old house 4.  Has the patient noticed any hearing difficulties?   yes; going on Monday to ENT for hearing evaluation, getting hearing aides adjusted  Fall Risk Assessment See under rooming  Depression Screen See under rooming Depression screen [facility] 2/9 06/09/2017 04/26/2016 02/27/2016 09/03/2015 08/21/2015  Decreased Interest 0 0 0 1 3  Down, Depressed, Hopeless 0 0 0 0 2  PHQ - 2 Score 0 0 0 1 5  Altered sleeping - - - 3 3  Tired, decreased energy - - - 0 0  Change in appetite - - - 0 0  Feeling bad or failure about yourself  - - - 0 0  Trouble concentrating - - - 0 0  Moving slowly or fidgety/restless - - - 0 0  Suicidal thoughts - - - 0 0  PHQ-9 Score - - - 4 8  Difficult doing work/chores - - - Not difficult at all Not difficult at all    Advanced Directives Does patient have a HCPOA?    yes If yes, name and contact information: Janalyn Harder, [phone] Does patient have a living will or MOST form?  yes  Objective:   Vitals: BP 122/78 (BP Location: Right Arm, Cuff Size: Normal)   Pulse 88   Temp 97.8 F (36.6 C) (Oral)   Resp 16   Ht 5\' 8"  (1.727 m)   Wt 173 lb 8 oz (78.7 kg)   SpO2 96%   BMI 26.38 kg/m  Body mass index is 26.38 kg/m. No exam data present  Physical Exam   Constitutional: She appears well-developed and well-nourished.  Cardiovascular: Normal rate.   Pulmonary/Chest: Effort normal.  Psychiatric: Her mood appears not anxious. She does not exhibit a depressed mood.   Diabetic Foot Form - Detailed   Diabetic Foot Exam - detailed Diabetic Foot exam was performed with the following findings:  Yes 06/09/2017  8:49 AM  Visual Foot Exam completed.:  Yes  Is there swelling or and abnormal foot shape?:  No Pulse Foot Exam completed.:  Yes  Right Dorsalis Pedis:  Present Left Dorsalis Pedis:  Present  Sensory Foot Exam Completed.:  Yes Semmes-Weinstein Monofilament Test R Site 1-Great Toe:  Pos L Site 1-Great Toe:  Pos        Mood/affect:  Euthymic Appearance:  Casually dressed, good hygiene  No flowsheet data found.  Assessment & Plan:     Annual Wellness Visit  Reviewed patient's Family Medical History Reviewed and updated list of patient's medical providers Assessment of cognitive impairment was done Assessed patient's functional ability Established a written schedule for health screening [place] Completed and Reviewed  Exercise Activities and Dietary recommendations Goals    . Increase water intake       Immunization History  Administered Date(s) Administered  . Influenza,inj,Quad PF,36+ Mos 08/22/2015  . Pneumococcal Conjugate-13 06/09/2017  . Pneumococcal Polysaccharide-23 11/27/2015  . Tdap 10/25/2008  . Zoster 02/27/2016    Health Maintenance  Topic Date Due  . PNA vac Low Risk Adult (1 of 2 - PCV13) 11/26/2016  . INFLUENZA VACCINE  05/25/2017  . HEMOGLOBIN A1C  06/01/2017  . COLONOSCOPY  10/05/2017  . FOOT EXAM  12/02/2017  . OPHTHALMOLOGY EXAM  01/14/2018  . MAMMOGRAM  06/06/2018  . TETANUS/TDAP  10/25/2018  . DEXA SCAN  Completed  . Hepatitis C Screening  Completed    Discussed health benefits of physical activity, and encouraged her to engage in regular exercise appropriate for  her age and condition.   No orders of the defined types were placed in this encounter.   Current Outpatient Prescriptions:  .  acetaminophen (TYLENOL) 500 MG tablet, Take 1,000 mg by mouth every 6 (six) hours as needed (for pain.)., Disp: , Rfl:  .  aspirin EC 81 MG tablet, Take 81 mg by mouth at bedtime., Disp: , Rfl:  .  CINNAMON PO, Take 200 mg by mouth 2 (two) times daily. , Disp: , Rfl:  .  Coenzyme Q10 (COQ10) 400 MG CAPS, Take 400 mg by mouth daily., Disp: , Rfl:  .  Lancets (ACCU-CHEK SOFT TOUCH) lancets, Use as instructed (Patient taking differently: 1 each by Other route 3 (three) times a week. Use as instructed), Disp: 100 each, Rfl: 12 .  lisinopril (PRINIVIL,ZESTRIL) 5 MG tablet, TAKE 1 TABLET BY MOUTH  DAILY FOR BLOOD PRESSURE, Disp: 90 tablet, Rfl: 1 .  metFORMIN (GLUCOPHAGE-XR) 500 MG 24 hr tablet, TAKE 1 TABLET BY MOUTH  DAILY WITH BREAKFAST, Disp: 90 tablet, Rfl: 1 .  Multiple Vitamin (MULTIVITAMIN WITH MINERALS) TABS tablet, Take 1 tablet by mouth daily. CENTRUM SILVER FOR ADULTS 50+, Disp: , Rfl:  .  ONETOUCH VERIO test strip, CHECK FINGERSTICK BLOOD SUGARS ONCE A DAY (Patient taking differently: CHECK FINGERSTICK BLOOD SUGARS THREE TIMES A WEEK), Disp: 100 each, Rfl: 1 .  PREVIDENT 5000 PLUS 1.1 % CREA dental cream, Place 1 application onto teeth 2 (two) times daily., Disp: , Rfl:  .  Probiotic Product (TRUBIOTICS) CAPS, Take 1 capsule by mouth daily., Disp: , Rfl:  .  ranitidine (ZANTAC) 150 MG tablet, Take 150 mg by mouth daily. , Disp: , Rfl:  .  TURMERIC PO, Take 300 mg by mouth daily., Disp: , Rfl:  .  Calcium-Vitamin D-Vitamin K (CHEWABLE CALCIUM) 500-200-40 MG-UNT-MCG CHEW, Chew 1 tablet by mouth 2 (two) times a week. , Disp: , Rfl:  .  simvastatin (ZOCOR) 10 MG tablet, TAKE 1 TABLET BY MOUTH AT  BEDTIME, Disp: 90 tablet, Rfl: 1 Medications Discontinued During This Encounter  Medication Reason  . HYDROcodone-acetaminophen (NORCO/VICODIN) 5-325 MG tablet Completed  Course  . sucralfate (CARAFATE) 1 g tablet Completed Course  . oxybutynin (DITROPAN) 5 MG tablet Completed Course  . ranitidine (ZANTAC) 101 MG capsule Duplicate    Next Medicare Wellness Visit in 12+ months  Problem List Items Addressed This Visit      Cardiovascular and Mediastinum   Essential hypertension    controlled      Aortic atherosclerosis ([place])    Showed model of atherosclerosis at varying stages; she has quit smoking; LDL has been under 70 the last 3 checks      Relevant Orders   Lipid panel     Endocrine   Type 2 diabetes mellitus, controlled ([place])    Foot exam today; check A1c and return for full visit about diabetes      Relevant Orders   Hemoglobin A1c   Microalbumin / creatinine urine ratio     Other   Preventative health care    USPSTF grade A and B recommendations reviewed with patient; age-appropriate recommendations, preventive care, screening tests, etc discussed and encouraged; healthy living encouraged; see AVS for patient education given to patient       Need for vaccination with 13-polyvalent pneumococcal conjugate vaccine    Offered today, given; no further boosters of this needed; next PPSV-23      Medication monitoring encounter    Check liver and kidneys      Relevant Orders   COMPLETE METABOLIC PANEL WITH GFR   Hyperlipidemia    Check lipids, avoid saturated fats      Relevant Orders   Lipid panel   History of tobacco use    Continue chest CTs until quit for 15 years      DNAR (do not attempt resuscitation)    Respectful of her wishes       Other Visit Diagnoses    Need for vaccination against Streptococcus pneumoniae using pneumococcal conjugate vaccine 13    -  Primary   Relevant Orders   Pneumococcal conjugate vaccine 13-valent (Completed)

## 2017-06-09 NOTE — Patient Instructions (Addendum)
Your next chest CT will be due on or after November 29, 2017 Your next bone density test will be due on or after May 26, 2018 Please get a flu shot in the next month or two Your next pneumonia shot will be a booster of the PPSV-23, due on or after Nov 27, 2020  Health Maintenance, Female Adopting a healthy lifestyle and getting preventive care can go a long way to promote health and wellness. Talk with your health care provider about what schedule of regular examinations is right for you. This is a good chance for you to check in with your provider about disease prevention and staying healthy. In between checkups, there are plenty of things you can do on your own. Experts have done a lot of research about which lifestyle changes and preventive measures are most likely to keep you healthy. Ask your health care provider for more information. Weight and diet Eat a healthy diet  Be sure to include plenty of vegetables, fruits, low-fat dairy products, and lean protein.  Do not eat a lot of foods high in solid fats, added sugars, or salt.  Get regular exercise. This is one of the most important things you can do for your health. ? Most adults should exercise for at least 150 minutes each week. The exercise should increase your heart rate and make you sweat (moderate-intensity exercise). ? Most adults should also do strengthening exercises at least twice a week. This is in addition to the moderate-intensity exercise.  Maintain a healthy weight  Body mass index (BMI) is a measurement that can be used to identify possible weight problems. It estimates body fat based on height and weight. Your health care provider can help determine your BMI and help you achieve or maintain a healthy weight.  For females 66 years of age and older: ? A BMI below 18.5 is considered underweight. ? A BMI of 18.5 to 24.9 is normal. ? A BMI of 25 to 29.9 is considered overweight. ? A BMI of 30 and above is considered  obese.  Watch levels of cholesterol and blood lipids  You should start having your blood tested for lipids and cholesterol at 66 years of age, then have this test every 5 years.  You may need to have your cholesterol levels checked more often if: ? Your lipid or cholesterol levels are high. ? You are older than 66 years of age. ? You are at high risk for heart disease.  Cancer screening Lung Cancer  Lung cancer screening is recommended for adults 14-66 years old who are at high risk for lung cancer because of a history of smoking.  A yearly low-dose CT scan of the lungs is recommended for people who: ? Currently smoke. ? Have quit within the past 15 years. ? Have at least a 30-pack-year history of smoking. A pack year is smoking an average of one pack of cigarettes a day for 1 year.  Yearly screening should continue until it has been 15 years since you quit.  Yearly screening should stop if you develop a health problem that would prevent you from having lung cancer treatment.  Breast Cancer  Practice breast self-awareness. This means understanding how your breasts normally appear and feel.  It also means doing regular breast self-exams. Let your health care provider know about any changes, no matter how small.  If you are in your 20s or 30s, you should have a clinical breast exam (CBE) by a health care provider  every 1-3 years as part of a regular health exam.  If you are 41 or older, have a CBE every year. Also consider having a breast X-ray (mammogram) every year.  If you have a family history of breast cancer, talk to your health care provider about genetic screening.  If you are at high risk for breast cancer, talk to your health care provider about having an MRI and a mammogram every year.  Breast cancer gene (BRCA) assessment is recommended for women who have family members with BRCA-related cancers. BRCA-related cancers  include: ? Breast. ? Ovarian. ? Tubal. ? Peritoneal cancers.  Results of the assessment will determine the need for genetic counseling and BRCA1 and BRCA2 testing.  Cervical Cancer Your health care provider may recommend that you be screened regularly for cancer of the pelvic organs (ovaries, uterus, and vagina). This screening involves a pelvic examination, including checking for microscopic changes to the surface of your cervix (Pap test). You may be encouraged to have this screening done every 3 years, beginning at age 65.  For women ages 44-65, health care providers may recommend pelvic exams and Pap testing every 3 years, or they may recommend the Pap and pelvic exam, combined with testing for human papilloma virus (HPV), every 5 years. Some types of HPV increase your risk of cervical cancer. Testing for HPV may also be done on women of any age with unclear Pap test results.  Other health care providers may not recommend any screening for nonpregnant women who are considered low risk for pelvic cancer and who do not have symptoms. Ask your health care provider if a screening pelvic exam is right for you.  If you have had past treatment for cervical cancer or a condition that could lead to cancer, you need Pap tests and screening for cancer for at least 20 years after your treatment. If Pap tests have been discontinued, your risk factors (such as having a new sexual partner) need to be reassessed to determine if screening should resume. Some women have medical problems that increase the chance of getting cervical cancer. In these cases, your health care provider may recommend more frequent screening and Pap tests.  Colorectal Cancer  This type of cancer can be detected and often prevented.  Routine colorectal cancer screening usually begins at 66 years of age and continues through 66 years of age.  Your health care provider may recommend screening at an earlier age if you have risk factors  for colon cancer.  Your health care provider may also recommend using home test kits to check for hidden blood in the stool.  A small camera at the end of a tube can be used to examine your colon directly (sigmoidoscopy or colonoscopy). This is done to check for the earliest forms of colorectal cancer.  Routine screening usually begins at age 19.  Direct examination of the colon should be repeated every 5-10 years through 66 years of age. However, you may need to be screened more often if early forms of precancerous polyps or small growths are found.  Skin Cancer  Check your skin from head to toe regularly.  Tell your health care provider about any new moles or changes in moles, especially if there is a change in a mole's shape or color.  Also tell your health care provider if you have a mole that is larger than the size of a pencil eraser.  Always use sunscreen. Apply sunscreen liberally and repeatedly throughout the day.  Protect yourself by wearing long sleeves, pants, a wide-brimmed hat, and sunglasses whenever you are outside.  Heart disease, diabetes, and high blood pressure  High blood pressure causes heart disease and increases the risk of stroke. High blood pressure is more likely to develop in: ? People who have blood pressure in the high end of the normal range (130-139/85-89 mm Hg). ? People who are overweight or obese. ? People who are African American.  If you are 57-39 years of age, have your blood pressure checked every 3-5 years. If you are 19 years of age or older, have your blood pressure checked every year. You should have your blood pressure measured twice-once when you are at a hospital or clinic, and once when you are not at a hospital or clinic. Record the average of the two measurements. To check your blood pressure when you are not at a hospital or clinic, you can use: ? An automated blood pressure machine at a pharmacy. ? A home blood pressure monitor.  If  you are between 19 years and 38 years old, ask your health care provider if you should take aspirin to prevent strokes.  Have regular diabetes screenings. This involves taking a blood sample to check your fasting blood sugar level. ? If you are at a normal weight and have a low risk for diabetes, have this test once every three years after 66 years of age. ? If you are overweight and have a high risk for diabetes, consider being tested at a younger age or more often. Preventing infection Hepatitis B  If you have a higher risk for hepatitis B, you should be screened for this virus. You are considered at high risk for hepatitis B if: ? You were born in a country where hepatitis B is common. Ask your health care provider which countries are considered high risk. ? Your parents were born in a high-risk country, and you have not been immunized against hepatitis B (hepatitis B vaccine). ? You have HIV or AIDS. ? You use needles to inject street drugs. ? You live with someone who has hepatitis B. ? You have had sex with someone who has hepatitis B. ? You get hemodialysis treatment. ? You take certain medicines for conditions, including cancer, organ transplantation, and autoimmune conditions.  Hepatitis C  Blood testing is recommended for: ? Everyone born from [birth year] through 1965. ? Anyone with known risk factors for hepatitis C.  Sexually transmitted infections (STIs)  You should be screened for sexually transmitted infections (STIs) including gonorrhea and chlamydia if: ? You are sexually active and are younger than 66 years of age. ? You are older than 66 years of age and your health care provider tells you that you are at risk for this type of infection. ? Your sexual activity has changed since you were last screened and you are at an increased risk for chlamydia or gonorrhea. Ask your health care provider if you are at risk.  If you do not have HIV, but are at risk, it may be recommended  that you take a prescription medicine daily to prevent HIV infection. This is called pre-exposure prophylaxis (PrEP). You are considered at risk if: ? You are sexually active and do not regularly use condoms or know the HIV status of your partner(s). ? You take drugs by injection. ? You are sexually active with a partner who has HIV.  Talk with your health care provider about whether you are at high risk of  being infected with HIV. If you choose to begin PrEP, you should first be tested for HIV. You should then be tested every 3 months for as long as you are taking PrEP. Pregnancy  If you are premenopausal and you may become pregnant, ask your health care provider about preconception counseling.  If you may become pregnant, take 400 to 800 micrograms (mcg) of folic acid every day.  If you want to prevent pregnancy, talk to your health care provider about birth control (contraception). Osteoporosis and menopause  Osteoporosis is a disease in which the bones lose minerals and strength with aging. This can result in serious bone fractures. Your risk for osteoporosis can be identified using a bone density scan.  If you are 63 years of age or older, or if you are at risk for osteoporosis and fractures, ask your health care provider if you should be screened.  Ask your health care provider whether you should take a calcium or vitamin D supplement to lower your risk for osteoporosis.  Menopause may have certain physical symptoms and risks.  Hormone replacement therapy may reduce some of these symptoms and risks. Talk to your health care provider about whether hormone replacement therapy is right for you. Follow these instructions at home:  Schedule regular health, dental, and eye exams.  Stay current with your immunizations.  Do not use any tobacco products including cigarettes, chewing tobacco, or electronic cigarettes.  If you are pregnant, do not drink alcohol.  If you are  breastfeeding, limit how much and how often you drink alcohol.  Limit alcohol intake to no more than 1 drink per day for nonpregnant women. One drink equals 12 ounces of beer, 5 ounces of wine, or 1 ounces of hard liquor.  Do not use street drugs.  Do not share needles.  Ask your health care provider for help if you need support or information about quitting drugs.  Tell your health care provider if you often feel depressed.  Tell your health care provider if you have ever been abused or do not feel safe at home. This information is not intended to replace advice given to you by your health care provider. Make sure you discuss any questions you have with your health care provider. Document Released: 04/26/2011 Document Revised: 03/18/2016 Document Reviewed: 07/15/2015 Elsevier Interactive Patient Education  [employer].

## 2017-06-09 NOTE — Assessment & Plan Note (Signed)
Showed model of atherosclerosis at varying stages; she has quit smoking; LDL has been under 70 the last 3 checks

## 2017-06-09 NOTE — Assessment & Plan Note (Signed)
controlled 

## 2017-06-09 NOTE — Assessment & Plan Note (Signed)
USPSTF grade A and B recommendations reviewed with patient; age-appropriate recommendations, preventive care, screening tests, etc discussed and encouraged; healthy living encouraged; see AVS for patient education given to patient  

## 2017-06-09 NOTE — Assessment & Plan Note (Signed)
Continue chest CTs until quit for 15 years

## 2017-06-09 NOTE — Assessment & Plan Note (Signed)
Foot exam today; check A1c and return for full visit about diabetes

## 2017-06-09 NOTE — Assessment & Plan Note (Signed)
Check lipids, avoid saturated fats 

## 2017-06-09 NOTE — Assessment & Plan Note (Signed)
Check liver and kidneys 

## 2017-06-09 NOTE — Assessment & Plan Note (Signed)
Offered today, given; no further boosters of this needed; next PPSV-23

## 2017-06-09 NOTE — Assessment & Plan Note (Signed)
Respectful of her wishes

## 2017-06-10 LAB — COMPLETE METABOLIC PANEL WITH GFR
ALBUMIN: 4.5 g/dL (ref 3.6–5.1)
ALK PHOS: 81 U/L (ref 33–130)
ALT: 19 U/L (ref 6–29)
AST: 17 U/L (ref 10–35)
BUN: 28 mg/dL — AB (ref 7–25)
CALCIUM: 9.5 mg/dL (ref 8.6–10.4)
CO2: 23 mmol/L (ref 20–32)
Chloride: 107 mmol/L (ref 98–110)
Creat: 0.81 mg/dL (ref 0.50–0.99)
GFR, EST NON AFRICAN AMERICAN: 76 mL/min (ref >=60)
GFR, Est African American: 88 mL/min (ref >=60)
Glucose, Bld: 104 mg/dL — ABNORMAL HIGH (ref 65–99)
POTASSIUM: 4.9 mmol/L (ref 3.5–5.3)
Sodium: 142 mmol/L (ref 135–146)
Total Bilirubin: 0.4 mg/dL (ref 0.2–1.2)
Total Protein: 6.6 g/dL (ref 6.1–8.1)

## 2017-06-10 LAB — MICROALBUMIN / CREATININE URINE RATIO
Creatinine, Urine: 162 mg/dL (ref 20–320)
MICROALB UR: 0.5 mg/dL
MICROALB/CREAT RATIO: 3 ug/mg{creat} (ref <30)

## 2017-06-10 LAB — LIPID PANEL
CHOLESTEROL: 163 mg/dL (ref <200)
HDL: 58 mg/dL (ref >50)
LDL Cholesterol: 82 mg/dL (ref <100)
Total CHOL/HDL Ratio: 2.8 Ratio (ref <5.0)
Triglycerides: 113 mg/dL (ref <150)
VLDL: 23 mg/dL (ref <30)

## 2017-06-10 LAB — HEMOGLOBIN A1C
HEMOGLOBIN A1C: 5.7 % — AB (ref <5.7)
Mean Plasma Glucose: 117 mg/dL

## 2017-06-16 ENCOUNTER — Encounter: Payer: Self-pay | Admitting: Family Medicine

## 2017-06-17 ENCOUNTER — Encounter: Payer: Self-pay | Admitting: Family Medicine

## 2017-06-17 MED ORDER — SIMVASTATIN 20 MG PO TABS: 20.0000 mg | ORAL_TABLET | Freq: Every day | ORAL | 3 refills | Status: DC

## 2017-06-20 MED ORDER — SIMVASTATIN 20 MG PO TABS: 20.0000 mg | ORAL_TABLET | Freq: Every day | ORAL | 3 refills | Status: DC

## 2017-06-23 ENCOUNTER — Ambulatory Visit (INDEPENDENT_AMBULATORY_CARE_PROVIDER_SITE_OTHER): Payer: Medicare Other | Admitting: Family Medicine

## 2017-06-23 ENCOUNTER — Encounter: Payer: Self-pay | Admitting: Family Medicine

## 2017-06-23 VITALS — BP 124/76 | HR 90 | Temp 97.8°F | Resp 14 | Wt 175.9 lb

## 2017-06-23 DIAGNOSIS — Z23 Encounter for immunization: Secondary | ICD-10-CM

## 2017-06-23 DIAGNOSIS — M85852 Other specified disorders of bone density and structure, left thigh: Secondary | ICD-10-CM | POA: Diagnosis not present

## 2017-06-23 DIAGNOSIS — I10 Essential (primary) hypertension: Secondary | ICD-10-CM

## 2017-06-23 DIAGNOSIS — J432 Centrilobular emphysema: Secondary | ICD-10-CM

## 2017-06-23 DIAGNOSIS — I700 Atherosclerosis of aorta: Secondary | ICD-10-CM | POA: Diagnosis not present

## 2017-06-23 DIAGNOSIS — N952 Postmenopausal atrophic vaginitis: Secondary | ICD-10-CM

## 2017-06-23 DIAGNOSIS — E1159 Type 2 diabetes mellitus with other circulatory complications: Secondary | ICD-10-CM | POA: Diagnosis not present

## 2017-06-23 DIAGNOSIS — E782 Mixed hyperlipidemia: Secondary | ICD-10-CM

## 2017-06-23 DIAGNOSIS — H6123 Impacted cerumen, bilateral: Secondary | ICD-10-CM

## 2017-06-23 DIAGNOSIS — R918 Other nonspecific abnormal finding of lung field: Secondary | ICD-10-CM

## 2017-06-23 NOTE — Progress Notes (Signed)
BP 124/76   Pulse 90   Temp 97.8 F (36.6 C) (Oral)   Resp 14   Wt 175 lb 14.4 oz (79.8 kg)   SpO2 95%   BMI 26.75 kg/m    Subjective:    Patient ID: Felicia Cisneros, female    DOB: 1951-03-12, 66 y.o.   MRN: [id number]  HPI: Felicia Cisneros is a 66 y.o. female  Chief Complaint  Patient presents with  . Follow-up    HPI Patient is here for a problem-based visit; she had all of her labs drawn recently and we'll review those today  Trying melatonin for sleep; just wanted to make sure that was okay; trouble falling asleep and staying asleep; tried tylenol PM but feels groggier in the morning, and only uses if needing the tylenol part  HTN; not checking BP at home; has a machine, but not needing to check; taking meds regularly; trying to limit salt; aware of added salt already in food; no black licorice; very rarely uses one sudafed for sinus headaches; knows she should avoid  Aortic atherosclerosis; last LDL not at goal, statin increased just recently; down to 2% milk; milk is a major beverage with meals; tried almond milk but did not like it; eats oatmeal every morning, raisins, toasted pecans, truvia and half tbsp butter  Type 2 diabetes mellitus; urine microalbumin:Cr normal just two weeks ago; eye exam UTD Lab Results  Component Value Date   HGBA1C 5.7 (H) 06/09/2017   Emphysema (COPD); not limiting activities; avoiding 2nd hand smoke  Osteopenia; DEXA August 2017; stairs at home, tries to not fall; good leg strength; getting plenty of calcium; outdoors for vitamin D  Atrophic vaginitis; no bleeding; no desire to see gyn  High cholesterol; taking statin, just increased, no muscle aches; taking CoQ 10  Depression screen [facility] 2/9 06/23/2017 06/09/2017 04/26/2016 02/27/2016 09/03/2015  Decreased Interest 0 0 0 0 1  Down, Depressed, Hopeless 0 0 0 0 0  PHQ - 2 Score 0 0 0 0 1  Altered sleeping - - - - 3  Tired, decreased energy - - - - 0  Change in appetite - - - - 0    Feeling bad or failure about yourself  - - - - 0  Trouble concentrating - - - - 0  Moving slowly or fidgety/restless - - - - 0  Suicidal thoughts - - - - 0  PHQ-9 Score - - - - 4  Difficult doing work/chores - - - - Not difficult at all    Relevant past medical, surgical, family and social history reviewed Past Medical History:  Diagnosis Date  . Aortic atherosclerosis ([place])   . Atrophic vaginitis   . COPD (chronic obstructive pulmonary disease) (HCC)    PT STATES SHE WAS TOLD THIS AFTER HAVING CT SCAN  . Diabetes mellitus without complication ([place])   . Diverticulitis   . DNAR (do not attempt resuscitation) 06/09/2017   Discussed today, 06/09/2017  . GERD (gastroesophageal reflux disease)   . Heartburn   . History of kidney stones   . History of tobacco use   . Hyperlipidemia   . IFG (impaired fasting glucose)   . Irregular heart beat 2018   WAS RECENTLY TOLD THIS IN DR Audree Bane OFFICE-PT UNAWARE OF THIS WHEN IT HAPPENS  . Leukorrhea, not specified as infective   . Osteopenia 05/27/2016   August 2017  . Personal history of tobacco use, presenting hazards to health 08/20/2015  . Unspecified  essential hypertension    Past Surgical History:  Procedure Laterality Date  . colonoscopy    . CYSTOSCOPY WITH BIOPSY N/A 01/17/2017   Procedure: CYSTOSCOPY WITH BIOPSY;  Surgeon: Hollice Espy, MD;  Location: [facility] ORS;  Service: Urology;  Laterality: N/A;  . TMJ ARTHROPLASTY  1984   Family History  Problem Relation Age of Onset  . Stroke Mother   . Hypertension Mother   . Hyperlipidemia Mother   . Diabetes Mother        pre-diabetic  . Diabetes Brother   . Irregular heart beat Brother   . Thyroid disease Sister   . Stroke Maternal Grandmother   . Cancer Maternal Grandfather        bone  . Breast cancer Neg Hx   . Hematuria Neg Hx   . Prostate cancer Neg Hx   . Renal cancer Neg Hx    Social History   Social History  . Marital status: Widowed    Spouse name: N/A  .  Number of children: N/A  . Years of education: N/A   Occupational History  . Not on file.   Social History Main Topics  . Smoking status: Former Smoker    Packs/day: 3.00    Years: 45.00    Types: Cigarettes    Quit date: 10/25/2009  . Smokeless tobacco: Never Used  . Alcohol use 0.0 oz/week     Comment: Rarely  . Drug use: No  . Sexual activity: Not Currently   Other Topics Concern  . Not on file   Social History Narrative  . No narrative on file   Interim medical history since last visit reviewed. Allergies and medications reviewed  Review of Systems Per HPI unless specifically indicated above     Objective:    BP 124/76   Pulse 90   Temp 97.8 F (36.6 C) (Oral)   Resp 14   Wt 175 lb 14.4 oz (79.8 kg)   SpO2 95%   BMI 26.75 kg/m   Wt Readings from Last 3 Encounters:  06/23/17 175 lb 14.4 oz (79.8 kg)  06/09/17 173 lb 8 oz (78.7 kg)  04/01/17 170 lb (77.1 kg)    Physical Exam  Constitutional: She appears well-developed and well-nourished. No distress.  HENT:  Head: Normocephalic and atraumatic.  Eyes: EOM are normal. No scleral icterus.  Neck: No thyromegaly present.  Cardiovascular: Normal rate, regular rhythm and normal heart sounds.   No murmur heard. Pulmonary/Chest: Effort normal and breath sounds normal. No respiratory distress. She has no wheezes.  Abdominal: Soft. Bowel sounds are normal. She exhibits no distension.  Musculoskeletal: Normal range of motion. She exhibits no edema.  Neurological: She is alert. She exhibits normal muscle tone.  Skin: Skin is warm and dry. She is not diaphoretic. No pallor.  Psychiatric: She has a normal mood and affect. Her behavior is normal. Judgment and thought content normal.   Diabetic Foot Form - Detailed   Diabetic Foot Exam - detailed Diabetic Foot exam was performed with the following findings:  Yes 06/23/2017 11:06 AM  Visual Foot Exam completed.:  Yes  Pulse Foot Exam completed.:  Yes  Right Dorsalis  Pedis:  Present Left Dorsalis Pedis:  Present  Sensory Foot Exam Completed.:  Yes Semmes-Weinstein Monofilament Cisneros R Site 1-Great Toe:  Pos L Site 1-Great Toe:  Pos        Results for orders placed or performed in visit on 06/09/17  COMPLETE METABOLIC PANEL WITH GFR  Result Value  Ref Range   Sodium 142 135 - 146 mmol/L   Potassium 4.9 3.5 - 5.3 mmol/L   Chloride 107 98 - 110 mmol/L   CO2 23 20 - 32 mmol/L   Glucose, Bld 104 (H) 65 - 99 mg/dL   BUN 28 (H) 7 - 25 mg/dL   Creat 0.81 0.50 - 0.99 mg/dL   Total Bilirubin 0.4 0.2 - 1.2 mg/dL   Alkaline Phosphatase 81 33 - 130 U/L   AST 17 10 - 35 U/L   ALT 19 6 - 29 U/L   Total Protein 6.6 6.1 - 8.1 g/dL   Albumin 4.5 3.6 - 5.1 g/dL   Calcium 9.5 8.6 - 10.4 mg/dL   GFR, Est African American 88 >=60 mL/min   GFR, Est Non African American 76 >=60 mL/min  Hemoglobin A1c  Result Value Ref Range   Hgb A1c MFr Bld 5.7 (H) <5.7 %   Mean Plasma Glucose 117 mg/dL  Lipid panel  Result Value Ref Range   Cholesterol 163 <200 mg/dL   Triglycerides 113 <150 mg/dL   HDL 58 >50 mg/dL   Total CHOL/HDL Ratio 2.8 <5.0 Ratio   VLDL 23 <30 mg/dL   LDL Cholesterol 82 <100 mg/dL  Microalbumin / creatinine urine ratio  Result Value Ref Range   Creatinine, Urine 162 20 - 320 mg/dL   Microalb, Ur 0.5 Not estab mg/dL   Microalb Creat Ratio 3 <30 mcg/mg creat      Assessment & Plan:   Problem List Items Addressed This Visit      Cardiovascular and Mediastinum   Essential hypertension    Excellent control; avoid excess salt      Aortic atherosclerosis (HCC)    Goal LDL under 70; try to avoid saturated fats        Respiratory   Centrilobular emphysema (HCC)    Not limiting activies, avoid 2nd hand smoke        Endocrine   Type 2 diabetes mellitus, controlled ([place])    Foot exam by MD today; A1c is excellent; limit sweets; eye exam UTD        Musculoskeletal and Integument   Osteopenia    Discussed; next bone scan due in August  2019; calcium, vit D, fall precautions, etc        Genitourinary   Atrophic vaginitis    Addressed, no referral, no red flags        Other   Pulmonary nodules/lesions, multiple    folloowed by cancer center      Needs flu shot    Already done this season      Hyperlipidemia    Goal LDL less than 70; limit sat fats          Follow up plan: Return in about 6 months (around 12/22/2017) for twenty minute follow-up with fasting labs.  An after-visit summary was printed and given to the patient at [facility].  Please see the patient instructions which may contain other information and recommendations beyond what is mentioned above in the assessment and plan.  Meds ordered this encounter  Medications  . Melatonin 2.5 MG CHEW    Sig: Chew 2.5 mg by mouth daily as needed.    No orders of the defined types were placed in this encounter.

## 2017-06-23 NOTE — Assessment & Plan Note (Signed)
Goal LDL less than 70; limit sat fats

## 2017-06-23 NOTE — Assessment & Plan Note (Signed)
Goal LDL under 70; try to avoid saturated fats

## 2017-06-23 NOTE — Assessment & Plan Note (Signed)
Excellent control; avoid excess salt

## 2017-06-23 NOTE — Assessment & Plan Note (Signed)
folloowed by cancer center

## 2017-06-23 NOTE — Assessment & Plan Note (Signed)
Discussed; next bone scan due in August 2019; calcium, vit D, fall precautions, etc

## 2017-06-23 NOTE — Addendum Note (Signed)
Addended by: Docia Furl on: 06/23/2017 03:26 PM   Modules accepted: Orders

## 2017-06-23 NOTE — Assessment & Plan Note (Signed)
Already done this season

## 2017-06-23 NOTE — Assessment & Plan Note (Signed)
Addressed, no referral, no red flags

## 2017-06-23 NOTE — Assessment & Plan Note (Signed)
Foot exam by MD today; A1c is excellent; limit sweets; eye exam UTD

## 2017-06-23 NOTE — Patient Instructions (Addendum)
Try to use PLAIN allergy medicine without the decongestant Avoid: phenylephrine, phenylpropanolamine, and pseudoephredine These can raise BP so monitor if you do decide to use and accept the risk (heart attack or stroke)  If you have not heard back from Dr. Grayland Ormond or someone else at the cancer center by Nov 25, 2017, please call them  Try to limit saturated fats in your diet (bologna, hot dogs, barbeque, cheeseburgers, hamburgers, steak, bacon, sausage, cheese, etc.) and get more fresh fruits, vegetables, and whole grains  Consider Shingrix at your pharmacy (two shot series) for shingles protection

## 2017-06-23 NOTE — Assessment & Plan Note (Signed)
Not limiting activies, avoid 2nd hand smoke

## 2017-07-10 NOTE — Progress Notes (Deleted)
Cardiology Office Note  Date:  07/10/2017   ID:  ROYALTI SCHAUF, DOB 1951/09/07, MRN [id number]  PCP:  Arnetha Courser, MD   No chief complaint on file.   HPI:  Mr. Felicia Cisneros is a very pleasant 66 year old woman with long history of  smoking,  Hyperlipidemia  dizziness and numbness on prior evaluation in 2014,  osteoporosis  who presents  for type 2 diabetes, risk stratification  She reports that she is doing well, active Denies any symptoms of chest pain or shortness of breath on exertion  stopped smoking approximately 5 years ago, did smoke on and off for 40 years Trying to watch her diet, hemoglobin A1c 5.8 Does work out at the recreation center, tone and fit class   Other lab work reviewed  Total chol 142, LDL 61  CT chest 2016: reviewed with her in detail, images pulled up in the office  No significant coronary ca Mild diffuse aortic atherosclerosis Seen in the aortic arch, descending aorta    main complaint is her Toes hurt, chronic Issue, etiology unclear   significant stress at home, lost of 2 family members this past year Husband had cancer  EKG on today's visit shows normal sinus rhythm with rate 74 bpm, unable to exclude old inferior MI. No change from previous EKG years ago  Other past medical history  On 07/10/2013 she ate lunch and during her meal developed numbness all over, dizziness that lasted for approximately one hour. He denies any nausea, vomiting, diarrhea. Her husband took her to the emergency room. She had lab work, chest x-ray, EKG, head CT scan. Potassium was 3.3, negative troponin.  scan of the head was essentially normal.  She has osteopenia noted on the DEXA, Hypertension, Type 2 diabetes; hyperlipidemia,   PMH:   has a past medical history of Aortic atherosclerosis ([place]); Atrophic vaginitis; COPD (chronic obstructive pulmonary disease) ([place]); Diabetes mellitus without complication ([place]); Diverticulitis; DNAR (do not attempt resuscitation)  (06/09/2017); GERD (gastroesophageal reflux disease); Heartburn; History of kidney stones; History of tobacco use; Hyperlipidemia; IFG (impaired fasting glucose); Irregular heart beat (2018); Leukorrhea, not specified as infective; Osteopenia (05/27/2016); Personal history of tobacco use, presenting hazards to health (08/20/2015); and Unspecified essential hypertension.  PSH:    Past Surgical History:  Procedure Laterality Date  . colonoscopy    . CYSTOSCOPY WITH BIOPSY N/A 01/17/2017   Procedure: CYSTOSCOPY WITH BIOPSY;  Surgeon: Hollice Espy, MD;  Location: [facility] ORS;  Service: Urology;  Laterality: N/A;  . TMJ ARTHROPLASTY  1984    Current Outpatient Prescriptions  Medication Sig Dispense Refill  . acetaminophen (TYLENOL) 500 MG tablet Take 1,000 mg by mouth every 6 (six) hours as needed (for pain.).    Marland Kitchen aspirin EC 81 MG tablet Take 81 mg by mouth at bedtime.    . Calcium-Vitamin D-Vitamin K (CHEWABLE CALCIUM) 500-200-40 MG-UNT-MCG CHEW Chew 1 tablet by mouth 2 (two) times a week.     Marland Kitchen CINNAMON PO Take 200 mg by mouth 2 (two) times daily.     . Coenzyme Q10 (COQ10) 400 MG CAPS Take 400 mg by mouth daily.    . Lancets (ACCU-CHEK SOFT TOUCH) lancets Use as instructed (Patient taking differently: 1 each by Other route 3 (three) times a week. Use as instructed) 100 each 12  . lisinopril (PRINIVIL,ZESTRIL) 5 MG tablet TAKE 1 TABLET BY MOUTH  DAILY FOR BLOOD PRESSURE 90 tablet 1  . Melatonin 2.5 MG CHEW Chew 2.5 mg by mouth daily as needed.    Marland Kitchen  metFORMIN (GLUCOPHAGE-XR) 500 MG 24 hr tablet TAKE 1 TABLET BY MOUTH  DAILY WITH BREAKFAST 90 tablet 1  . Multiple Vitamin (MULTIVITAMIN WITH MINERALS) TABS tablet Take 1 tablet by mouth daily. CENTRUM SILVER FOR ADULTS 50+    . ONETOUCH VERIO test strip CHECK FINGERSTICK BLOOD SUGARS ONCE A DAY (Patient taking differently: CHECK FINGERSTICK BLOOD SUGARS THREE TIMES A WEEK) 100 each 1  . PREVIDENT 5000 PLUS 1.1 % CREA dental cream Place 1 application onto  teeth 2 (two) times daily.    . Probiotic Product (TRUBIOTICS) CAPS Take 1 capsule by mouth daily.    . ranitidine (ZANTAC) 150 MG tablet Take 150 mg by mouth daily.     . simvastatin (ZOCOR) 20 MG tablet Take 1 tablet (20 mg total) by mouth at bedtime. 90 tablet 3  . TURMERIC PO Take 300 mg by mouth daily.     No current facility-administered medications for this visit.      Allergies:   Patient has no known allergies.   Social History:  The patient  reports that she quit smoking about 7 years ago. Her smoking use included Cigarettes. She has a 135.00 pack-year smoking history. She has never used smokeless tobacco. She reports that she drinks alcohol. She reports that she does not use drugs.   Family History:   family history includes Cancer in her maternal grandfather; Diabetes in her brother and mother; Hyperlipidemia in her mother; Hypertension in her mother; Irregular heart beat in her brother; Stroke in her maternal grandmother and mother; Thyroid disease in her sister.    Review of Systems: Review of Systems  Constitutional: Negative.   Respiratory: Negative.   Cardiovascular: Negative.   Gastrointestinal: Negative.   Musculoskeletal: Negative.        Pain in her toes  Neurological: Negative.   Psychiatric/Behavioral: Negative.   All other systems reviewed and are negative.    PHYSICAL EXAM: VS:  There were no vitals taken for this visit. , BMI There is no height or weight on file to calculate BMI. GEN: Well nourished, well developed, in no acute distress  HEENT: normal  Neck: no JVD, carotid bruits, or masses Cardiac: RRR; no murmurs, rubs, or gallops,no edema  Respiratory:  clear to auscultation bilaterally, normal work of breathing GI: soft, nontender, nondistended, + BS MS: no deformity or atrophy  Skin: warm and dry, no rash Neuro:  Strength and sensation are intact Psych: euthymic mood, full affect    Recent Labs: 04/01/2017: Hemoglobin 15.2; Platelets  205 06/09/2017: ALT 19; BUN 28; Creat 0.81; Potassium 4.9; Sodium 142    Lipid Panel Lab Results  Component Value Date   CHOL 163 06/09/2017   HDL 58 06/09/2017   LDLCALC 82 06/09/2017   TRIG 113 06/09/2017      Wt Readings from Last 3 Encounters:  06/23/17 175 lb 14.4 oz (79.8 kg)  06/09/17 173 lb 8 oz (78.7 kg)  04/01/17 170 lb (77.1 kg)       ASSESSMENT AND PLAN:  Hyperlipidemia Cholesterol is at goal on the current lipid regimen. No changes to the medications were made.  History of tobacco use Stopped smoking 5 years ago, denies significant shortness of breath on exertion CT scan 2016 for lung screening  Controlled type 2 diabetes mellitus with other circulatory complication, without long-term current use of insulin (HCC) A1c 5.8 We have encouraged continued exercise, careful diet management  Aortic atherosclerosis (HCC)  images reviewed with her,  mild aortic atherosclerosis, no significant coronary  calcifications noted . discussed with her in detail  Likely secondary to prior smoking history, hyperlipidemia   now all of her risk factors are well controlled No further workup at this time as she is asymptomatic   Total encounter time more than 45 minutes  Greater than 50% was spent in counseling and coordination of care with the patient   Disposition:   F/U  12 months   No orders of the defined types were placed in this encounter.    Signed, Esmond Plants, M.D., Ph.D. 07/10/2017  [place], [place]

## 2017-07-11 ENCOUNTER — Ambulatory Visit: Payer: Medicare Other | Admitting: Cardiovascular Disease

## 2017-08-24 NOTE — Progress Notes (Signed)
Cardiology Office Note  Date:  08/25/2017   ID:  Felicia Cisneros, DOB Jul 31, 1951, MRN [id number]  PCP:  Arnetha Courser, MD   Chief Complaint  Patient presents with  . OTHER    12 month f/u no complaints today. Meds reviewed verbally with pt.    HPI:  Felicia Cisneros is a very pleasant 66 year old woman with long history of  smoking, quit >5 yrs ago Hyperlipidemia , on statin Remote dizziness and numbness on prior evaluation in 2014,  osteoporosis  CT chest 2016:  No significant coronary ca Mild aorta plaque Chronic toe pain, insomnia  who presents for follow up of her type 2 diabetes, aorta atherosclerosis  In follow-up today she reports that she feels relatively well Problems with insomnia Toes hurt when sleeping, rubbing cream on the makes it better Takes melatonin for sleep  Does light exercise, sparingly Otherwise sedentary  Simvastatin up to 20 mg past few months Lab work reviewed with her HBA1C 5.7  EKG personally reviewed by myself on todays visit Shows normal sinus rhythm with rate 76 bpm, no significant ST or T wave changes   Other past medical history reviewed CT chest 2016: reviewed with her in detail, images pulled up in the office  No significant coronary ca Mild diffuse aortic atherosclerosis Seen in the aortic arch, descending aorta    significant stress at home, lost of 2 family members this past year Husband had cancer   On 07/10/2013 she ate lunch and during her meal developed numbness all over, dizziness that lasted for approximately one hour. He denies any nausea, vomiting, diarrhea. Her husband took her to the emergency room. She had lab work, chest x-ray, EKG, head CT scan. Potassium was 3.3, negative troponin.  scan of the head was essentially normal.  She has osteopenia noted on the DEXA, Hypertension, Type 2 diabetes; hyperlipidemia,   PMH:   has a past medical history of Aortic atherosclerosis ([place]); Atrophic vaginitis; COPD (chronic  obstructive pulmonary disease) ([place]); Diabetes mellitus without complication ([place]); Diverticulitis; DNAR (do not attempt resuscitation) (06/09/2017); GERD (gastroesophageal reflux disease); Heartburn; History of kidney stones; History of tobacco use; Hyperlipidemia; IFG (impaired fasting glucose); Irregular heart beat (2018); Leukorrhea, not specified as infective; Osteopenia (05/27/2016); Personal history of tobacco use, presenting hazards to health (08/20/2015); and Unspecified essential hypertension.  PSH:    Past Surgical History:  Procedure Laterality Date  . colonoscopy    . CYSTOSCOPY WITH BIOPSY N/A 01/17/2017   Procedure: CYSTOSCOPY WITH BIOPSY;  Surgeon: Hollice Espy, MD;  Location: [facility] ORS;  Service: Urology;  Laterality: N/A;  . TMJ ARTHROPLASTY  1984    Current Outpatient Prescriptions  Medication Sig Dispense Refill  . acetaminophen (TYLENOL) 500 MG tablet Take 1,000 mg by mouth every 6 (six) hours as needed (for pain.).    Marland Kitchen aspirin EC 81 MG tablet Take 81 mg by mouth at bedtime.    Marland Kitchen CINNAMON PO Take 200 mg by mouth 2 (two) times daily.     . Coenzyme Q10 (COQ10) 400 MG CAPS Take 400 mg by mouth daily.    . Lancets (ACCU-CHEK SOFT TOUCH) lancets Use as instructed (Patient taking differently: 1 each by Other route 3 (three) times a week. Use as instructed) 100 each 12  . lisinopril (PRINIVIL,ZESTRIL) 5 MG tablet TAKE 1 TABLET BY MOUTH  DAILY FOR BLOOD PRESSURE 90 tablet 1  . Melatonin 2.5 MG CHEW Chew 2.5 mg by mouth daily as needed.    . metFORMIN (GLUCOPHAGE-XR) 500  MG 24 hr tablet TAKE 1 TABLET BY MOUTH  DAILY WITH BREAKFAST 90 tablet 1  . Multiple Vitamin (MULTIVITAMIN WITH MINERALS) TABS tablet Take 1 tablet by mouth daily. CENTRUM SILVER FOR ADULTS 50+    . ONETOUCH VERIO test strip CHECK FINGERSTICK BLOOD SUGARS ONCE A DAY (Patient taking differently: CHECK FINGERSTICK BLOOD SUGARS THREE TIMES A WEEK) 100 each 1  . PREVIDENT 5000 PLUS 1.1 % CREA dental cream Place 1  application onto teeth daily as needed.     . Probiotic Product (TRUBIOTICS) CAPS Take 1 capsule by mouth daily.    . ranitidine (ZANTAC) 150 MG tablet Take 150 mg by mouth daily.     . simvastatin (ZOCOR) 20 MG tablet Take 1 tablet (20 mg total) by mouth at bedtime. 90 tablet 3  . TURMERIC PO Take 300 mg by mouth daily.     No current facility-administered medications for this visit.      Allergies:   Patient has no known allergies.   Social History:  The patient  reports that she quit smoking about 7 years ago. Her smoking use included Cigarettes. She has a 135.00 pack-year smoking history. She has never used smokeless tobacco. She reports that she drinks alcohol. She reports that she does not use drugs.   Family History:   family history includes Cancer in her maternal grandfather; Diabetes in her brother and mother; Hyperlipidemia in her mother; Hypertension in her mother; Irregular heart beat in her brother; Stroke in her maternal grandmother and mother; Thyroid disease in her sister.    Review of Systems: Review of Systems  Constitutional: Negative.   Respiratory: Negative.   Cardiovascular: Negative.   Gastrointestinal: Negative.   Musculoskeletal: Negative.        Pain in her toes  Neurological: Negative.   Psychiatric/Behavioral: Negative.   All other systems reviewed and are negative.    PHYSICAL EXAM: VS:  BP 104/72 (BP Location: Left Arm, Patient Position: Sitting, Cuff Size: Normal)   Pulse 76   Ht 5\' 10"  (1.778 m)   Wt 175 lb 8 oz (79.6 kg)   BMI 25.18 kg/m  , BMI Body mass index is 25.18 kg/m.  GEN: Well nourished, well developed, in no acute distress  HEENT: normal  Neck: no JVD, carotid bruits, or masses Cardiac: RRR; no murmurs, rubs, or gallops,no edema  Respiratory:  clear to auscultation bilaterally, normal work of breathing GI: soft, nontender, nondistended, + BS MS: no deformity or atrophy  Skin: warm and dry, no rash Neuro:  Strength and  sensation are intact Psych: euthymic mood, full affect    Recent Labs: 04/01/2017: Hemoglobin 15.2; Platelets 205 06/09/2017: ALT 19; BUN 28; Creat 0.81; Potassium 4.9; Sodium 142    Lipid Panel Lab Results  Component Value Date   CHOL 163 06/09/2017   HDL 58 06/09/2017   LDLCALC 82 06/09/2017   TRIG 113 06/09/2017      Wt Readings from Last 3 Encounters:  08/25/17 175 lb 8 oz (79.6 kg)  06/23/17 175 lb 14.4 oz (79.8 kg)  06/09/17 173 lb 8 oz (78.7 kg)       ASSESSMENT AND PLAN:  Hyperlipidemia Scheduled for follow-up with primary care, recently increased from simvastatin 10 up to 20 Recommended regular walking to maintain weight.  She has had 3 pound weight gain  History of tobacco use Stopped smoking 5 years ago, denies significant shortness of breath on exertion CT scan 2016 for lung screening  Controlled type 2 diabetes mellitus  with other circulatory complication, without long-term current use of insulin (HCC) A1c 5.8 Encouraged regular exercise program, low carbohydrates  Aortic atherosclerosis (HCC) Likely secondary to prior smoking history, hyperlipidemia  Risk factors relatively well controlled   Total encounter time more than 25 minutes  Greater than 50% was spent in counseling and coordination of care with the patient   Disposition:   F/U  12 months as needed    Orders Placed This Encounter  Procedures  . EKG 12-Lead     Signed, Esmond Plants, M.D., Ph.D. 08/25/2017  [place], [place]

## 2017-08-25 ENCOUNTER — Ambulatory Visit (INDEPENDENT_AMBULATORY_CARE_PROVIDER_SITE_OTHER): Payer: Medicare Other | Admitting: Cardiovascular Disease

## 2017-08-25 ENCOUNTER — Encounter: Payer: Self-pay | Admitting: Cardiovascular Disease

## 2017-08-25 VITALS — BP 104/72 | HR 76 | Ht 70.0 in | Wt 175.5 lb

## 2017-08-25 DIAGNOSIS — R9431 Abnormal electrocardiogram [ECG] [EKG]: Secondary | ICD-10-CM | POA: Diagnosis not present

## 2017-08-25 DIAGNOSIS — J432 Centrilobular emphysema: Secondary | ICD-10-CM

## 2017-08-25 DIAGNOSIS — Z87891 Personal history of nicotine dependence: Secondary | ICD-10-CM | POA: Diagnosis not present

## 2017-08-25 DIAGNOSIS — I700 Atherosclerosis of aorta: Secondary | ICD-10-CM

## 2017-08-25 DIAGNOSIS — I10 Essential (primary) hypertension: Secondary | ICD-10-CM

## 2017-08-25 DIAGNOSIS — E1159 Type 2 diabetes mellitus with other circulatory complications: Secondary | ICD-10-CM | POA: Diagnosis not present

## 2017-08-25 DIAGNOSIS — E782 Mixed hyperlipidemia: Secondary | ICD-10-CM

## 2017-08-25 NOTE — Patient Instructions (Addendum)

## 2017-09-01 DIAGNOSIS — L821 Other seborrheic keratosis: Secondary | ICD-10-CM | POA: Diagnosis not present

## 2017-09-01 DIAGNOSIS — L570 Actinic keratosis: Secondary | ICD-10-CM | POA: Diagnosis not present

## 2017-09-12 ENCOUNTER — Other Ambulatory Visit: Payer: Self-pay | Admitting: Family Medicine

## 2017-09-12 DIAGNOSIS — E119 Type 2 diabetes mellitus without complications: Secondary | ICD-10-CM

## 2017-09-12 NOTE — Telephone Encounter (Signed)
Reviewed Cr, -lytes; Rxs approved

## 2017-10-27 ENCOUNTER — Encounter: Payer: Self-pay | Admitting: Family Medicine

## 2017-11-01 ENCOUNTER — Encounter: Payer: Self-pay | Admitting: Cardiovascular Disease

## 2017-11-01 ENCOUNTER — Encounter: Payer: Self-pay | Admitting: Family Medicine

## 2017-11-01 NOTE — Telephone Encounter (Signed)
[facility] staff: please update health maintenance; cologuard done 10/06/2015; next due 10/05/2018 Thank you

## 2017-11-11 DIAGNOSIS — Z0000 Encounter for general adult medical examination without abnormal findings: Secondary | ICD-10-CM | POA: Diagnosis not present

## 2017-11-17 ENCOUNTER — Telehealth: Payer: Self-pay | Admitting: *Deleted

## 2017-11-17 DIAGNOSIS — Z87891 Personal history of nicotine dependence: Secondary | ICD-10-CM

## 2017-11-17 DIAGNOSIS — Z122 Encounter for screening for malignant neoplasm of respiratory organs: Secondary | ICD-10-CM

## 2017-11-17 NOTE — Telephone Encounter (Signed)
Notified patient that annual lung cancer screening low dose CT scan is due currently or will be in near future. Confirmed that patient is within the age range of 55-77, and asymptomatic, (no signs or symptoms of lung cancer). Patient denies illness that would prevent curative treatment for lung cancer if found. Verified smoking history, (former, quit 2012, 88 pack year). The shared decision making visit was done 07/26/14. Patient is agreeable for CT scan being scheduled.

## 2017-12-06 ENCOUNTER — Ambulatory Visit
Admission: RE | Admit: 2017-12-06 | Discharge: 2017-12-06 | Disposition: A | Discharge status: Home / Self Care | Payer: Medicare Other | Source: Ambulatory Visit | Attending: Nurse Practitioner | Admitting: Nurse Practitioner

## 2017-12-06 DIAGNOSIS — Z122 Encounter for screening for malignant neoplasm of respiratory organs: Secondary | ICD-10-CM

## 2017-12-06 DIAGNOSIS — Z87891 Personal history of nicotine dependence: Secondary | ICD-10-CM

## 2017-12-06 DIAGNOSIS — I700 Atherosclerosis of aorta: Secondary | ICD-10-CM | POA: Insufficient documentation

## 2017-12-06 DIAGNOSIS — J432 Centrilobular emphysema: Secondary | ICD-10-CM | POA: Diagnosis not present

## 2017-12-12 ENCOUNTER — Encounter: Payer: Self-pay | Admitting: *Deleted

## 2017-12-15 DIAGNOSIS — H25013 Cortical age-related cataract, bilateral: Secondary | ICD-10-CM | POA: Diagnosis not present

## 2017-12-24 DIAGNOSIS — Z7984 Long term (current) use of oral hypoglycemic drugs: Secondary | ICD-10-CM | POA: Diagnosis not present

## 2017-12-24 DIAGNOSIS — I10 Essential (primary) hypertension: Secondary | ICD-10-CM | POA: Diagnosis not present

## 2017-12-24 DIAGNOSIS — J449 Chronic obstructive pulmonary disease, unspecified: Secondary | ICD-10-CM | POA: Diagnosis not present

## 2017-12-24 DIAGNOSIS — Z79899 Other long term (current) drug therapy: Secondary | ICD-10-CM | POA: Diagnosis not present

## 2017-12-24 DIAGNOSIS — Z87891 Personal history of nicotine dependence: Secondary | ICD-10-CM | POA: Diagnosis not present

## 2017-12-24 DIAGNOSIS — E7800 Pure hypercholesterolemia, unspecified: Secondary | ICD-10-CM | POA: Diagnosis not present

## 2017-12-24 DIAGNOSIS — Z7982 Long term (current) use of aspirin: Secondary | ICD-10-CM | POA: Diagnosis not present

## 2017-12-24 DIAGNOSIS — S8012XA Contusion of left lower leg, initial encounter: Secondary | ICD-10-CM | POA: Diagnosis not present

## 2017-12-24 DIAGNOSIS — E119 Type 2 diabetes mellitus without complications: Secondary | ICD-10-CM | POA: Diagnosis not present

## 2018-01-02 ENCOUNTER — Encounter: Payer: Self-pay | Admitting: Family Medicine

## 2018-01-05 ENCOUNTER — Encounter: Payer: Self-pay | Admitting: Family Medicine

## 2018-01-05 ENCOUNTER — Ambulatory Visit
Admission: RE | Admit: 2018-01-05 | Discharge: 2018-01-05 | Disposition: A | Discharge status: Home / Self Care | Payer: Medicare Other | Source: Ambulatory Visit | Attending: Family Medicine | Admitting: Family Medicine

## 2018-01-05 ENCOUNTER — Ambulatory Visit: Payer: Medicare Other | Admitting: Family Medicine

## 2018-01-05 VITALS — BP 130/80 | HR 84 | Temp 98.3°F | Resp 16 | Ht 70.0 in | Wt 177.8 lb

## 2018-01-05 DIAGNOSIS — M79662 Pain in left lower leg: Secondary | ICD-10-CM | POA: Diagnosis not present

## 2018-01-05 DIAGNOSIS — L03116 Cellulitis of left lower limb: Secondary | ICD-10-CM

## 2018-01-05 DIAGNOSIS — M7989 Other specified soft tissue disorders: Secondary | ICD-10-CM

## 2018-01-05 DIAGNOSIS — R600 Localized edema: Secondary | ICD-10-CM | POA: Diagnosis not present

## 2018-01-05 MED ORDER — DOXYCYCLINE HYCLATE 100 MG PO TABS: 100.0000 mg | ORAL_TABLET | Freq: Two times a day (BID) | ORAL | 0 refills | Status: AC

## 2018-01-05 NOTE — Addendum Note (Signed)
Addended by: Hubbard Hartshorn on: 01/05/2018 02:05 PM   Modules accepted: Orders

## 2018-01-05 NOTE — Progress Notes (Addendum)
Name: Felicia Cisneros   MRN: [id number]    DOB: [birth year]   Date:01/05/2018       Progress Note  Subjective  Chief Complaint  Chief Complaint  Patient presents with  . Leg Swelling    for 2 weeks from wood falling on left leg,     HPI  Pt presents to follow up on LLE swelling, ecchymosis, and  She was seen in the ER in [place] on 12/25/17 - she dropped a piece of firewood on her LEFT LE and developed a large bruise to the posterior lower leg - she did not have any imaging done, and was told this was not a clot, but a large ecchymotic area.    - Since then the ecchymosis has decreased significantly with a small area on the medial ankle only.  Today she noticed an increase in swelling to her left ankle/lower calf.  She does have a small area of erythema to the posterior-medial area of the left calf.  She has been very active walking and exercising which she questions if this has worsened her swelling.  Calf does not have a sensation of tightness, but is tender in certain areas to palpation.  Endorses a feeling of tightness to the ankle that has been ongoing since the incident  - Denies chest pain, shortness of breath.  Patient Active Problem List   Diagnosis Date Noted  . DNAR (do not attempt resuscitation) 06/09/2017  . Need for vaccination with 13-polyvalent pneumococcal conjugate vaccine 06/09/2017  . Centrilobular emphysema ([place]) 12/02/2016  . Pulmonary nodules/lesions, multiple 12/02/2016  . Aortic atherosclerosis ([place]) 07/08/2016  . Osteopenia 05/27/2016  . Breast cancer screening 04/26/2016  . Need for shingles vaccine 04/01/2016  . Abnormality of nail tissue 11/27/2015  . Medication monitoring encounter 11/27/2015  . Needs flu shot 08/22/2015  . Preventative health care 08/22/2015  . Colon cancer screening 08/22/2015  . Herpes simplex antibody positive 08/22/2015  . Personal history of tobacco use, presenting hazards to health 08/20/2015  . Essential hypertension   .  Type 2 diabetes mellitus, controlled ([place])   . Atrophic vaginitis   . History of tobacco use   . Abnormal EKG 07/13/2013  . Dizziness 07/13/2013  . Hyperlipidemia 07/13/2013    Social History   Tobacco Use  . Smoking status: Former Smoker    Packs/day: 3.00    Years: 45.00    Pack years: 135.00    Types: Cigarettes    Last attempt to quit: 10/25/2009    Years since quitting: 8.2  . Smokeless tobacco: Never Used  Substance Use Topics  . Alcohol use: Yes    Alcohol/week: 0.0 oz    Comment: Rarely     Current Outpatient Medications:  .  acetaminophen (TYLENOL) 500 MG tablet, Take 1,000 mg by mouth every 6 (six) hours as needed (for pain.)., Disp: , Rfl:  .  aspirin EC 81 MG tablet, Take 81 mg by mouth at bedtime., Disp: , Rfl:  .  CINNAMON PO, Take 200 mg by mouth 2 (two) times daily. , Disp: , Rfl:  .  Coenzyme Q10 (COQ10) 400 MG CAPS, Take 400 mg by mouth daily., Disp: , Rfl:  .  Lancets (ACCU-CHEK SOFT TOUCH) lancets, Use as instructed (Patient taking differently: 1 each by Other route 3 (three) times a week. Use as instructed), Disp: 100 each, Rfl: 12 .  lisinopril (PRINIVIL,ZESTRIL) 5 MG tablet, TAKE 1 TABLET BY MOUTH  DAILY FOR BLOOD PRESSURE, Disp: 90 tablet, Rfl:  1 .  Melatonin 2.5 MG CHEW, Chew 2.5 mg by mouth daily as needed., Disp: , Rfl:  .  metFORMIN (GLUCOPHAGE-XR) 500 MG 24 hr tablet, TAKE 1 TABLET BY MOUTH  DAILY WITH BREAKFAST, Disp: 90 tablet, Rfl: 1 .  Multiple Vitamin (MULTIVITAMIN WITH MINERALS) TABS tablet, Take 1 tablet by mouth daily. CENTRUM SILVER FOR ADULTS 50+, Disp: , Rfl:  .  ONETOUCH VERIO test strip, CHECK FINGERSTICK BLOOD SUGARS ONCE A DAY (Patient taking differently: CHECK FINGERSTICK BLOOD SUGARS THREE TIMES A WEEK), Disp: 100 each, Rfl: 1 .  PREVIDENT 5000 PLUS 1.1 % CREA dental cream, Place 1 application onto teeth daily as needed. , Disp: , Rfl:  .  Probiotic Product (TRUBIOTICS) CAPS, Take 1 capsule by mouth daily., Disp: , Rfl:  .  ranitidine  (ZANTAC) 150 MG tablet, Take 150 mg by mouth daily. , Disp: , Rfl:  .  simvastatin (ZOCOR) 20 MG tablet, Take 1 tablet (20 mg total) by mouth at bedtime., Disp: 90 tablet, Rfl: 3 .  TURMERIC PO, Take 300 mg by mouth daily., Disp: , Rfl:   No Known Allergies  ROS Constitutional: Negative for fever or weight change.  Respiratory: Negative for cough and shortness of breath.   Cardiovascular: Negative for chest pain or palpitations.  Gastrointestinal: Negative for abdominal pain, no bowel changes.  Musculoskeletal: Negative for gait problem or joint swelling.  Skin: See HPI Neurological: Negative for dizziness or headache.  No other specific complaints in a complete review of systems (except as listed in HPI above).  Objective  Vitals:   01/05/18 1340  BP: 130/80  Pulse: 84  Resp: 16  Temp: 98.3 F (36.8 C)  TempSrc: Oral  SpO2: 95%  Weight: 177 lb 12.8 oz (80.6 kg)  Height: 5\' 10"  (1.778 m)   Body mass index is 25.51 kg/m.  Nursing Note and Vital Signs reviewed.  Physical Exam  Constitutional: Patient appears well-developed and well-nourished.  No distress.  HEENT: head atraumatic, normocephalic Cardiovascular: Normal rate, regular rhythm, S1/S2 present.  No murmur or rub heard. No BLE edema. Pulmonary/Chest: Effort normal and breath sounds clear. No respiratory distress or retractions. Psychiatric: Patient has a normal mood and affect. behavior is normal. Judgment and thought content normal. Skin/Peripheral Vascular:  small area of ecchymosis present on the medial ankle only.  Moderately sized area of erythema to the posterior-medial area of the left calf that is tender to palpation.  LLE is notable edematous in comparison to the RLE.  She has one nickel-sized area of firmness that she endorses tenderness on palpation.  Pedal pulse +2, skin is warm and dry.  No results found for this or any previous visit (from the past 72 hour(s)).  Assessment & Plan  1. Pain of left  calf - US Venous Img Lower Unilateral Left; Future - STAT  2. Localized swelling of lower extremity - US Venous Img Lower Unilateral Left; Future - STAT  3. Cellulitis of left lower extremity - doxycycline (VIBRA-TABS) 100 MG tablet; Take 1 tablet (100 mg total) by mouth 2 (two) times daily for 7 days.  Dispense: 14 tablet; Refill: 0  -Red flags and when to present for emergency care or RTC including fever >101.53F, chest pain, shortness of breath, new/worsening/un-resolving symptoms, reviewed with patient at time of visit. Follow up and care instructions discussed and provided in AVS.

## 2018-01-06 ENCOUNTER — Telehealth: Payer: Self-pay | Admitting: Gastroenterology

## 2018-01-06 NOTE — Telephone Encounter (Signed)
Attempted to call pt to schedule apt with Dr. Allen Norris per [place] there is a note from Dr. [name] in chart that pt needs to be seen for bloody school, please schedule apt if pt calls back

## 2018-01-11 DIAGNOSIS — S93422A Sprain of deltoid ligament of left ankle, initial encounter: Secondary | ICD-10-CM | POA: Diagnosis not present

## 2018-01-19 ENCOUNTER — Ambulatory Visit: Payer: Medicare Other | Admitting: Family Medicine

## 2018-01-20 ENCOUNTER — Encounter: Payer: Self-pay | Admitting: Nurse Practitioner

## 2018-01-20 ENCOUNTER — Ambulatory Visit: Payer: Medicare Other | Admitting: Nurse Practitioner

## 2018-01-20 VITALS — BP 130/72 | HR 85 | Temp 98.0°F | Resp 16 | Ht 70.0 in | Wt 179.6 lb

## 2018-01-20 DIAGNOSIS — Z79899 Other long term (current) drug therapy: Secondary | ICD-10-CM | POA: Diagnosis not present

## 2018-01-20 DIAGNOSIS — I700 Atherosclerosis of aorta: Secondary | ICD-10-CM | POA: Diagnosis not present

## 2018-01-20 DIAGNOSIS — E785 Hyperlipidemia, unspecified: Secondary | ICD-10-CM | POA: Diagnosis not present

## 2018-01-20 DIAGNOSIS — R600 Localized edema: Secondary | ICD-10-CM | POA: Diagnosis not present

## 2018-01-20 DIAGNOSIS — L03116 Cellulitis of left lower limb: Secondary | ICD-10-CM | POA: Diagnosis not present

## 2018-01-20 DIAGNOSIS — I10 Essential (primary) hypertension: Secondary | ICD-10-CM | POA: Diagnosis not present

## 2018-01-20 DIAGNOSIS — J432 Centrilobular emphysema: Secondary | ICD-10-CM | POA: Diagnosis not present

## 2018-01-20 DIAGNOSIS — E1159 Type 2 diabetes mellitus with other circulatory complications: Secondary | ICD-10-CM | POA: Diagnosis not present

## 2018-01-20 MED ORDER — DOXYCYCLINE HYCLATE 100 MG PO TABS: 100.0000 mg | ORAL_TABLET | Freq: Two times a day (BID) | ORAL | 0 refills | Status: DC
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
## 2018-01-20 NOTE — Patient Instructions (Addendum)
-   Do NOT use your compression stocking when you are sleeping - If you have numbness or tingling in your toes take off compression- to come in to be re-evaluated if this sensation does not resolve  - You can switch from the compression stocking to the ace bandage  Increasing tightness every hour and allowing release in between and not wearing to bed as discussed - keep leg elevated - follow up with vascular - take antibiotics if worsening redness, heat on leg - Come in a few days before your regular follow up with Dr. [name] for blood work     When should I contact my health care provider? Seek medical care if:  You develop any pain or numbness in your leg.  You develop any redness or your skin breaks down   Seek immediate medical care if:  Your leg becomes warm, red, or swollen.  You have severe pain in your leg.  You have chest pain.  You have trouble breathing.  This information is not intended to replace advice given to you by your health care provider. Make sure you discuss any questions you have with your health care provider. Document Released: 11/13/2010 Document Revised: 09/07/2016 Document Reviewed: 05/28/2015 Elsevier Interactive Patient Education  2017 [employer].

## 2018-01-31 ENCOUNTER — Ambulatory Visit (INDEPENDENT_AMBULATORY_CARE_PROVIDER_SITE_OTHER): Payer: Medicare Other | Admitting: Vascular Surgery

## 2018-01-31 ENCOUNTER — Encounter (INDEPENDENT_AMBULATORY_CARE_PROVIDER_SITE_OTHER): Payer: Self-pay | Admitting: Vascular Surgery

## 2018-01-31 VITALS — BP 118/71 | HR 77 | Resp 16 | Ht 68.0 in | Wt 177.0 lb

## 2018-01-31 DIAGNOSIS — E782 Mixed hyperlipidemia: Secondary | ICD-10-CM

## 2018-01-31 DIAGNOSIS — E1159 Type 2 diabetes mellitus with other circulatory complications: Secondary | ICD-10-CM | POA: Diagnosis not present

## 2018-01-31 DIAGNOSIS — R600 Localized edema: Secondary | ICD-10-CM | POA: Insufficient documentation
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
## 2018-02-07 ENCOUNTER — Other Ambulatory Visit: Payer: Self-pay

## 2018-02-07 ENCOUNTER — Ambulatory Visit: Payer: Medicare Other | Admitting: Gastroenterology

## 2018-02-07 ENCOUNTER — Encounter: Payer: Self-pay | Admitting: Gastroenterology

## 2018-02-07 VITALS — BP 122/76 | HR 71 | Temp 97.5°F | Ht 68.0 in | Wt 177.0 lb

## 2018-02-07 DIAGNOSIS — K921 Melena: Secondary | ICD-10-CM | POA: Diagnosis not present

## 2018-02-07 NOTE — Progress Notes (Signed)
Gastroenterology Consultation  Referring Provider:     Arnetha Courser, MD Primary Care Physician:  Arnetha Courser, MD Primary Gastroenterologist:  Dr. Allen Norris     Reason for Consultation:     Rectal bleeding        HPI:   Felicia Cisneros is a 67 y.o. y/o female referred for consultation & management of Rectal bleeding by Dr. [name], Satira Anis, MD.  This patient comes in today with a history of having a colonoscopy by me back in 2007.  The patient has had intermittent in bright red blood per rectum that has been going on for some time.  She reports that she has started to be hemorrhoids.  She also reports that her stools are somewhat small and pebble-like.  There is no report of any unexplained weight loss fevers chills nausea or vomiting.  The patient also has no abdominal pain.  The patient had a complete metabolic panel done at the end of last year with normal liver enzymes. There are no recent labs.    Past Medical History:  Diagnosis Date  . Aortic atherosclerosis ([place])   . Atrophic vaginitis   . COPD (chronic obstructive pulmonary disease) ([facility])    PT STATES SHE WAS TOLD THIS AFTER HAVING CT SCAN  . Diabetes mellitus without complication ([place])   . Diverticulitis   . DNAR (do not attempt resuscitation) 06/09/2017   Discussed today, 06/09/2017  . GERD (gastroesophageal reflux disease)   . Heartburn   . History of kidney stones   . History of tobacco use   . Hyperlipidemia   . IFG (impaired fasting glucose)   . Irregular heart beat 2018   WAS RECENTLY TOLD THIS IN DR Audree Bane OFFICE-PT UNAWARE OF THIS WHEN IT HAPPENS  . Leukorrhea, not specified as infective   . Osteopenia 05/27/2016   August 2017  . Personal history of tobacco use, presenting hazards to health 08/20/2015  . Unspecified essential hypertension     Past Surgical History:  Procedure Laterality Date  . colonoscopy    . CYSTOSCOPY WITH BIOPSY N/A 01/17/2017   Procedure: CYSTOSCOPY WITH BIOPSY;  Surgeon: Hollice Espy, MD;  Location: [facility] ORS;  Service: Urology;  Laterality: N/A;  . TMJ ARTHROPLASTY  1984    Prior to Admission medications   Medication Sig Start Date End Date Taking? Authorizing Provider  acetaminophen (TYLENOL) 500 MG tablet Take 1,000 mg by mouth every 6 (six) hours as needed (for pain.).   Yes [provider]  aspirin EC 81 MG tablet Take 81 mg by mouth at bedtime.   Yes [provider]  CINNAMON PO Take 200 mg by mouth 2 (two) times daily.    Yes [provider]  Coenzyme Q10 (COQ10) 400 MG CAPS Take 400 mg by mouth daily.   Yes [provider]  Lancets (ACCU-CHEK SOFT TOUCH) lancets Use as instructed Patient taking differently: 1 each by Other route 3 (three) times a week. Use as instructed 11/27/15  Yes Lada, Satira Anis, MD  lisinopril (PRINIVIL,ZESTRIL) 5 MG tablet TAKE 1 TABLET BY MOUTH  DAILY FOR BLOOD PRESSURE 09/12/17  Yes Lada, Satira Anis, MD  metFORMIN (GLUCOPHAGE-XR) 500 MG 24 hr tablet TAKE 1 TABLET BY MOUTH  DAILY WITH BREAKFAST 09/12/17  Yes Lada, Satira Anis, MD  Multiple Vitamin (MULTIVITAMIN WITH MINERALS) TABS tablet Take 1 tablet by mouth daily. CENTRUM SILVER FOR ADULTS 50+   Yes [provider]  ONETOUCH VERIO test strip CHECK FINGERSTICK  BLOOD SUGARS ONCE A DAY Patient taking differently: CHECK FINGERSTICK BLOOD SUGARS THREE TIMES A WEEK 04/23/16  Yes Lada, Satira Anis, MD  PREVIDENT 5000 PLUS 1.1 % CREA dental cream Place 1 application onto teeth daily as needed.  10/13/16  Yes [provider]  Probiotic Product (TRUBIOTICS) CAPS Take 1 capsule by mouth daily.   Yes [provider]  ranitidine (ZANTAC) 150 MG tablet Take 150 mg by mouth daily.    Yes [provider]  simvastatin (ZOCOR) 20 MG tablet Take 1 tablet (20 mg total) by mouth at bedtime. 06/20/17  Yes Lada, Satira Anis, MD  Melatonin 2.5 MG CHEW Chew 2.5 mg by mouth daily as needed.    [provider]  TURMERIC PO Take 300 mg by  mouth daily.    [provider]    Family History  Problem Relation Age of Onset  . Stroke Mother   . Hypertension Mother   . Hyperlipidemia Mother   . Diabetes Mother        pre-diabetic  . Diabetes Brother   . Irregular heart beat Brother   . Thyroid disease Sister   . Stroke Maternal Grandmother   . Cancer Maternal Grandfather        bone  . Breast cancer Neg Hx   . Hematuria Neg Hx   . Prostate cancer Neg Hx   . Renal cancer Neg Hx      Social History   Tobacco Use  . Smoking status: Former Smoker    Packs/day: 3.00    Years: 45.00    Pack years: 135.00    Types: Cigarettes    Last attempt to quit: 10/25/2009    Years since quitting: 8.2  . Smokeless tobacco: Never Used  Substance Use Topics  . Alcohol use: Yes    Alcohol/week: 0.0 oz    Comment: Rarely  . Drug use: No    Allergies as of 02/07/2018  . (No Known Allergies)    Review of Systems:    All systems reviewed and negative except where noted in HPI.   Physical Exam:  BP 122/76   Pulse 71   Temp (!) 97.5 F (36.4 C) (Oral)   Ht 5\' 8"  (1.727 m)   Wt 177 lb (80.3 kg)   BMI 26.91 kg/m  No LMP recorded. Patient is postmenopausal. Psych:  Alert and cooperative. Normal mood and affect. General:   Alert,  Well-developed, well-nourished, pleasant and cooperative in NAD Head:  Normocephalic and atraumatic. Eyes:  Sclera clear, no icterus.   Conjunctiva pink. Ears:  Normal auditory acuity. Nose:  No deformity, discharge, or lesions. Mouth:  No deformity or lesions,oropharynx pink & moist. Neck:  Supple; no masses or thyromegaly. Lungs:  Respirations even and unlabored.  Clear throughout to auscultation.   No wheezes, crackles, or rhonchi. No acute distress. Heart:  Regular rate and rhythm; no murmurs, clicks, rubs, or gallops. Abdomen:  Normal bowel sounds.  No bruits.  Soft, non-tender and non-distended without masses, hepatosplenomegaly or hernias noted.  No guarding or rebound tenderness.   Negative Carnett sign.   Rectal:  Deferred.  Msk:  Symmetrical without gross deformities.  Good, equal movement & strength bilaterally. Pulses:  Normal pulses noted. Extremities:  No clubbing or edema.  No cyanosis. Neurologic:  Alert and oriented x3;  grossly normal neurologically. Skin:  Intact without significant lesions or rashes.  No jaundice. Lymph Nodes:  No significant cervical adenopathy. Psych:  Alert and cooperative. Normal mood and affect.  Imaging Studies: No results found.  Assessment and Plan:   ZEHRA RUCCI is a 67 y.o. y/o female who comes in today with a history of intermittent rectal bleeding as bright red and appears to be consistent with hemorrhoidal bleeding.  The patient also reports that she has had small pebble-like stools. The patient has been told to increase fiber in her diet to help with her hemorrhoids and with her stool consistency.  The patient will also be set up for a colonoscopy due to her not having a screening for over 10 years. I have discussed risks & benefits which include, but are not limited to, bleeding, infection, perforation & drug reaction.  The patient agrees with this plan & written consent will be obtained.     Lucilla Lame, MD. Marval Regal   Note: This dictation was prepared with Dragon dictation along with smaller phrase technology. Any transcriptional errors that result from this process are unintentional.

## 2018-02-08 ENCOUNTER — Other Ambulatory Visit: Payer: Self-pay

## 2018-02-08 DIAGNOSIS — K921 Melena: Secondary | ICD-10-CM

## 2018-02-09 ENCOUNTER — Encounter: Payer: Self-pay | Admitting: Family Medicine

## 2018-02-10 ENCOUNTER — Encounter: Payer: Self-pay | Admitting: Family Medicine

## 2018-02-13 ENCOUNTER — Encounter: Payer: Self-pay | Admitting: Family Medicine

## 2018-02-14 ENCOUNTER — Telehealth: Payer: Self-pay | Admitting: Gastroenterology

## 2018-02-14 NOTE — Telephone Encounter (Signed)
Panya sent. Thanks [employer]

## 2018-02-14 NOTE — Telephone Encounter (Signed)
Prep needs to be called in and e-mail instructions to patient today.

## 2018-02-16 NOTE — Telephone Encounter (Signed)
Pt left vm she states she has not received a call and needs a call

## 2018-02-16 NOTE — Telephone Encounter (Signed)
Call returned.  Patient wanted to make sure it was ok for one family member to bring her and stay until another family member could arrive.  I informed her that as long as someone is available during the entire colonoscopy process she is fine.  Thanks [employer]

## 2018-02-17 NOTE — Discharge Instructions (Signed)
General Anesthesia, Adult, Care After °These instructions provide you with information about caring for yourself after your procedure. Your health care provider may also give you more specific instructions. Your treatment has been planned according to current medical practices, but problems sometimes occur. Call your health care provider if you have any problems or questions after your procedure. °What can I expect after the procedure? °After the procedure, it is common to have: °· Vomiting. °· A sore throat. °· Mental slowness. ° °It is common to feel: °· Nauseous. °· Cold or shivery. °· Sleepy. °· Tired. °· Sore or achy, even in parts of your body where you did not have surgery. ° °Follow these instructions at home: °For at least 24 hours after the procedure: °· Do not: °? Participate in activities where you could fall or become injured. °? Drive. °? Use heavy machinery. °? Drink alcohol. °? Take sleeping pills or medicines that cause drowsiness. °? Make important decisions or sign legal documents. °? Take care of children on your own. °· Rest. °Eating and drinking °· If you vomit, drink water, juice, or soup when you can drink without vomiting. °· Drink enough fluid to keep your urine clear or pale yellow. °· Make sure you have little or no nausea before eating solid foods. °· Follow the diet recommended by your health care provider. °General instructions °· Have a responsible adult stay with you until you are awake and alert. °· Return to your normal activities as told by your health care provider. Ask your health care provider what activities are safe for you. °· Take over-the-counter and prescription medicines only as told by your health care provider. °· If you smoke, do not smoke without supervision. °· Keep all follow-up visits as told by your health care provider. This is important. °Contact a health care provider if: °· You continue to have nausea or vomiting at home, and medicines are not helpful. °· You  cannot drink fluids or start eating again. °· You cannot urinate after 8-12 hours. °· You develop a skin rash. °· You have fever. °· You have increasing redness at the site of your procedure. °Get help right away if: °· You have difficulty breathing. °· You have chest pain. °· You have unexpected bleeding. °· You feel that you are having a life-threatening or urgent problem. °This information is not intended to replace advice given to you by your health care provider. Make sure you discuss any questions you have with your health care provider. °Document Released: 01/17/2001 Document Revised: 03/15/2016 Document Reviewed: 09/25/2015 °Elsevier Interactive Patient Education © 2018 [employer] ° °

## 2018-02-20 ENCOUNTER — Ambulatory Visit: Payer: Medicare Other | Admitting: Anesthesiology

## 2018-02-20 ENCOUNTER — Ambulatory Visit
Admission: RE | Admit: 2018-02-20 | Discharge: 2018-02-20 | Disposition: A | Discharge status: Home / Self Care | Payer: Medicare Other | Source: Ambulatory Visit | Attending: Gastroenterology | Admitting: Gastroenterology

## 2018-02-20 ENCOUNTER — Encounter
Admission: RE | Disposition: A | Discharge status: Home / Self Care | Payer: Self-pay | Source: Ambulatory Visit | Attending: Gastroenterology

## 2018-02-20 DIAGNOSIS — K641 Second degree hemorrhoids: Secondary | ICD-10-CM | POA: Diagnosis not present

## 2018-02-20 DIAGNOSIS — K5730 Diverticulosis of large intestine without perforation or abscess without bleeding: Secondary | ICD-10-CM | POA: Insufficient documentation

## 2018-02-20 DIAGNOSIS — E119 Type 2 diabetes mellitus without complications: Secondary | ICD-10-CM | POA: Diagnosis not present

## 2018-02-20 DIAGNOSIS — J449 Chronic obstructive pulmonary disease, unspecified: Secondary | ICD-10-CM | POA: Diagnosis not present

## 2018-02-20 DIAGNOSIS — Z1211 Encounter for screening for malignant neoplasm of colon: Secondary | ICD-10-CM | POA: Insufficient documentation

## 2018-02-20 DIAGNOSIS — K219 Gastro-esophageal reflux disease without esophagitis: Secondary | ICD-10-CM | POA: Diagnosis not present

## 2018-02-20 DIAGNOSIS — D125 Benign neoplasm of sigmoid colon: Secondary | ICD-10-CM | POA: Diagnosis not present

## 2018-02-20 DIAGNOSIS — Z87891 Personal history of nicotine dependence: Secondary | ICD-10-CM | POA: Insufficient documentation

## 2018-02-20 DIAGNOSIS — Z7984 Long term (current) use of oral hypoglycemic drugs: Secondary | ICD-10-CM | POA: Diagnosis not present

## 2018-02-20 DIAGNOSIS — K635 Polyp of colon: Secondary | ICD-10-CM | POA: Insufficient documentation

## 2018-02-20 DIAGNOSIS — Z79899 Other long term (current) drug therapy: Secondary | ICD-10-CM | POA: Diagnosis not present

## 2018-02-20 DIAGNOSIS — E785 Hyperlipidemia, unspecified: Secondary | ICD-10-CM | POA: Diagnosis not present

## 2018-02-20 DIAGNOSIS — Z87442 Personal history of urinary calculi: Secondary | ICD-10-CM | POA: Insufficient documentation

## 2018-02-20 DIAGNOSIS — M8580 Other specified disorders of bone density and structure, unspecified site: Secondary | ICD-10-CM | POA: Diagnosis not present

## 2018-02-20 DIAGNOSIS — I10 Essential (primary) hypertension: Secondary | ICD-10-CM | POA: Insufficient documentation

## 2018-02-20 DIAGNOSIS — Z7982 Long term (current) use of aspirin: Secondary | ICD-10-CM | POA: Insufficient documentation

## 2018-02-20 DIAGNOSIS — Z66 Do not resuscitate: Secondary | ICD-10-CM | POA: Diagnosis not present

## 2018-02-20 HISTORY — PX: POLYPECTOMY: SHX5525

## 2018-02-20 HISTORY — PX: COLONOSCOPY WITH PROPOFOL: SHX5780

## 2018-02-20 LAB — GLUCOSE, CAPILLARY: GLUCOSE-CAPILLARY: 114 mg/dL — AB (ref 65–99)

## 2018-02-20 SURGERY — COLONOSCOPY WITH PROPOFOL
Anesthesia: General | Wound class: Clean Contaminated

## 2018-02-20 MED ORDER — PROPOFOL 10 MG/ML IV BOLUS
INTRAVENOUS | Status: DC | PRN
  Administered 2018-02-20: 40 mg via INTRAVENOUS
  Administered 2018-02-20 (×3): 20 mg via INTRAVENOUS
  Administered 2018-02-20: 60 mg via INTRAVENOUS
  Administered 2018-02-20 (×4): 20 mg via INTRAVENOUS
  Administered 2018-02-20: 40 mg via INTRAVENOUS

## 2018-02-20 MED ORDER — STERILE WATER FOR IRRIGATION IR SOLN
Status: DC | PRN
  Administered 2018-02-20 (×2)

## 2018-02-20 MED ORDER — LACTATED RINGERS IV SOLN
10.0000 mL/h | INTRAVENOUS | Status: DC
  Administered 2018-02-20: 10 mL/h via INTRAVENOUS
  Administered 2018-02-20: 08:00:00 via INTRAVENOUS

## 2018-02-20 MED ORDER — LIDOCAINE HCL (CARDIAC) PF 100 MG/5ML IV SOSY
PREFILLED_SYRINGE | INTRAVENOUS | Status: DC | PRN
  Administered 2018-02-20: 20 mg via INTRAVENOUS

## 2018-02-20 SURGICAL SUPPLY — 24 items

## 2018-02-20 NOTE — Anesthesia Procedure Notes (Signed)
Procedure Name: MAC Date/Time: 02/20/2018 8:02 AM Performed by: Lind Guest, CRNA Pre-anesthesia Checklist: Patient identified, Emergency Drugs available, Suction available, Patient being monitored and Timeout performed Patient Re-evaluated:Patient Re-evaluated prior to induction Oxygen Delivery Method: Nasal cannula

## 2018-02-20 NOTE — Transfer of Care (Signed)
Immediate Anesthesia Transfer of Care Note  Patient: Felicia Cisneros  Procedure(s) Performed: COLONOSCOPY WITH PROPOFOL (N/A )  Patient Location: PACU  Anesthesia Type: General  Level of Consciousness: awake, alert  and patient cooperative  Airway and Oxygen Therapy: Patient Spontanous Breathing and Patient connected to supplemental oxygen  Post-op Assessment: Post-op Vital signs reviewed, Patient's Cardiovascular Status Stable, Respiratory Function Stable, Patent Airway and No signs of Nausea or vomiting  Post-op Vital Signs: Reviewed and stable  Complications: No apparent anesthesia complications

## 2018-02-20 NOTE — Anesthesia Postprocedure Evaluation (Signed)
Anesthesia Post Note  Patient: Felicia Cisneros  Procedure(s) Performed: COLONOSCOPY WITH PROPOFOL (N/A )  Patient location during evaluation: PACU Anesthesia Type: General Level of consciousness: awake and alert Pain management: pain level controlled Vital Signs Assessment: post-procedure vital signs reviewed and stable Respiratory status: spontaneous breathing, nonlabored ventilation, respiratory function stable and patient connected to nasal cannula oxygen Cardiovascular status: blood pressure returned to baseline and stable Postop Assessment: no apparent nausea or vomiting Anesthetic complications: no    Alisa Graff

## 2018-02-20 NOTE — Anesthesia Preprocedure Evaluation (Signed)
Anesthesia Evaluation  Patient identified by MRN, date of birth, ID band Patient awake    Reviewed: Allergy & Precautions, H&P , NPO status , Patient's Chart, lab work & pertinent test results, reviewed documented beta blocker date and time   History of Anesthesia Complications Negative for: history of anesthetic complications  Airway Mallampati: I  TM Distance: >3 FB Neck ROM: full    Dental  (+) Caps, Missing   Pulmonary neg shortness of breath, neg sleep apnea, COPD, neg recent URI, former smoker,           Cardiovascular Exercise Tolerance: Good hypertension, (-) angina+ Peripheral Vascular Disease  (-) CAD, (-) Past MI, (-) Cardiac Stents and (-) CABG (-) dysrhythmias (-) Valvular Problems/Murmurs     Neuro/Psych negative neurological ROS  negative psych ROS   GI/Hepatic Neg liver ROS, GERD  ,  Endo/Other  diabetes, Oral Hypoglycemic Agents  Renal/GU Renal disease (kidney stone)  negative genitourinary   Musculoskeletal   Abdominal   Peds  Hematology negative hematology ROS (+)   Anesthesia Other Findings Past Medical History: No date: Aortic atherosclerosis (HCC) No date: Atrophic vaginitis No date: COPD (chronic obstructive pulmonary disease) (*     Comment: PT STATES SHE WAS TOLD THIS AFTER HAVING CT               SCAN No date: Diabetes mellitus without complication (HCC) No date: Diverticulitis No date: GERD (gastroesophageal reflux disease) No date: Heartburn No date: History of kidney stones No date: History of tobacco use No date: Hyperlipidemia No date: IFG (impaired fasting glucose) 2018: Irregular heart beat     Comment: WAS RECENTLY TOLD THIS IN DR Audree Bane               OFFICE-PT UNAWARE OF THIS WHEN IT HAPPENS No date: Leukorrhea, not specified as infective 05/27/2016: Osteopenia     Comment: August 2017 08/20/2015: Personal history of tobacco use, presenting ha* No date: Unspecified  essential hypertension   Reproductive/Obstetrics negative OB ROS                             Anesthesia Physical  Anesthesia Plan  ASA: III  Anesthesia Plan: General   Post-op Pain Management:    Induction:   PONV Risk Score and Plan:   Airway Management Planned:   Additional Equipment:   Intra-op Plan:   Post-operative Plan:   Informed Consent: I have reviewed the patients History and Physical, chart, labs and discussed the procedure including the risks, benefits and alternatives for the proposed anesthesia with the patient or authorized representative who has indicated his/her understanding and acceptance.   Dental Advisory Given  Plan Discussed with: Anesthesiologist, CRNA and Surgeon  Anesthesia Plan Comments:         Anesthesia Quick Evaluation

## 2018-02-20 NOTE — Op Note (Signed)
[facility] Gastroenterology Patient Name: Felicia Cisneros Procedure Date: 02/20/2018 7:44 AM MRN: [id number] Account #: [id number] Date of Birth: 10-13-1951 Admit Type: Outpatient Age: 67 Room: [facility] OR ROOM 01 Gender: Female Note Status: Finalized Procedure:            Colonoscopy Indications:          Screening for colorectal malignant neoplasm Providers:            Lucilla Lame MD, MD Referring MD:         Arnetha Courser (Referring MD) Medicines:            Propofol per Anesthesia Complications:        No immediate complications. Procedure:            Pre-Anesthesia Assessment:                       - Prior to the procedure, a History and Physical was                        performed, and patient medications and allergies were                        reviewed. The patient's tolerance of previous                        anesthesia was also reviewed. The risks and benefits of                        the procedure and the sedation options and risks were                        discussed with the patient. All questions were                        answered, and informed consent was obtained. Prior                        Anticoagulants: The patient has taken no previous                        anticoagulant or antiplatelet agents. ASA Grade                        Assessment: II - A patient with mild systemic disease.                        After reviewing the risks and benefits, the patient was                        deemed in satisfactory condition to undergo the                        procedure.                       After obtaining informed consent, the colonoscope was                        passed under direct vision. Throughout the procedure,  the patient's blood pressure, pulse, and oxygen                        saturations were monitored continuously. The Olympus                        CF-HQ190L Colonoscope (S#. [id number]) was introduced                  through the anus and advanced to the the cecum,                        identified by appendiceal orifice and ileocecal valve.                        The colonoscopy was performed without difficulty. The                        patient tolerated the procedure well. The quality of                        the bowel preparation was adequate. Findings:      The perianal and digital rectal examinations were normal.      Three sessile polyps were found in the sigmoid colon. The polyps were 2       to 3 mm in size. These polyps were removed with a cold biopsy forceps.       Resection and retrieval were complete.      Multiple small-mouthed diverticula were found in the sigmoid colon.      Non-bleeding internal hemorrhoids were found during retroflexion. The       hemorrhoids were Grade II (internal hemorrhoids that prolapse but reduce       spontaneously). Impression:           - Three 2 to 3 mm polyps in the sigmoid colon, removed                        with a cold biopsy forceps. Resected and retrieved.                       - Diverticulosis in the sigmoid colon.                       - Non-bleeding internal hemorrhoids. Recommendation:       - Discharge patient to home.                       - Resume previous diet.                       - Continue present medications.                       - Await pathology results.                       - Repeat colonoscopy in 5 years if polyp adenoma and 10                        years if hyperplastic Procedure Code(s):    --- Professional ---  45380, Colonoscopy, flexible; with biopsy, single or                        multiple Diagnosis Code(s):    --- Professional ---                       Z12.11, Encounter for screening for malignant neoplasm                        of colon                       D12.5, Benign neoplasm of sigmoid colon CPT copyright 2017 [employer]. All rights reserved. The codes  documented in this report are preliminary and upon coder review may  be revised to meet current compliance requirements. Lucilla Lame MD, MD 02/20/2018 8:30:58 AM This report has been signed electronically. Number of Addenda: 0 Note Initiated On: 02/20/2018 7:44 AM Scope Withdrawal Time: 0 hours 9 minutes 2 seconds  Total Procedure Duration: 0 hours 25 minutes 19 seconds       [facility]

## 2018-02-20 NOTE — H&P (Signed)
Lucilla Lame, MD [address], [place] [address] Phone: [phone] Fax : [phone]  Primary Care Physician:  Arnetha Courser, MD Primary Gastroenterologist:  Dr. Allen Norris  Pre-Procedure History & Physical: HPI:  Felicia Cisneros is a 67 y.o. female is here for a screening colonoscopy.   Past Medical History:  Diagnosis Date  . Aortic atherosclerosis ([place])   . Atrophic vaginitis   . COPD (chronic obstructive pulmonary disease) (HCC)    PT STATES SHE WAS TOLD THIS AFTER HAVING CT SCAN  . Diabetes mellitus without complication ([place])   . Diverticulitis   . DNAR (do not attempt resuscitation) 06/09/2017   Discussed today, 06/09/2017  . GERD (gastroesophageal reflux disease)   . Heartburn   . History of kidney stones   . History of tobacco use   . Hyperlipidemia   . IFG (impaired fasting glucose)   . Irregular heart beat 2018   WAS RECENTLY TOLD THIS IN DR Audree Bane OFFICE-PT UNAWARE OF THIS WHEN IT HAPPENS  . Leukorrhea, not specified as infective   . Osteopenia 05/27/2016   August 2017  . Personal history of tobacco use, presenting hazards to health 08/20/2015  . Unspecified essential hypertension     Past Surgical History:  Procedure Laterality Date  . colonoscopy    . CYSTOSCOPY WITH BIOPSY N/A 01/17/2017   Procedure: CYSTOSCOPY WITH BIOPSY;  Surgeon: Hollice Espy, MD;  Location: [facility];  Service: Urology;  Laterality: N/A;  . TMJ ARTHROPLASTY  1984    Prior to Admission medications   Medication Sig Start Date End Date Taking? Authorizing Provider  acetaminophen (TYLENOL) 500 MG tablet Take 1,000 mg by mouth every 6 (six) hours as needed (for pain.).   Yes [provider]  aspirin EC 81 MG tablet Take 81 mg by mouth at bedtime.   Yes [provider]  CINNAMON PO Take 200 mg by mouth 2 (two) times daily.    Yes [provider]  Coenzyme Q10 (COQ10) 400 MG CAPS Take 400 mg by mouth daily.   Yes [provider]    lisinopril (PRINIVIL,ZESTRIL) 5 MG tablet TAKE 1 TABLET BY MOUTH  DAILY FOR BLOOD PRESSURE 09/12/17  Yes Lada, Satira Anis, MD  Melatonin 2.5 MG CHEW Chew 2.5 mg by mouth daily as needed.   Yes [provider]  metFORMIN (GLUCOPHAGE-XR) 500 MG 24 hr tablet TAKE 1 TABLET BY MOUTH  DAILY WITH BREAKFAST 09/12/17  Yes Lada, Satira Anis, MD  Multiple Vitamin (MULTIVITAMIN WITH MINERALS) TABS tablet Take 1 tablet by mouth daily. CENTRUM SILVER FOR ADULTS 50+   Yes [provider]  Probiotic Product (TRUBIOTICS) CAPS Take 1 capsule by mouth daily.   Yes [provider]  ranitidine (ZANTAC) 150 MG tablet Take 150 mg by mouth daily.    Yes [provider]  simvastatin (ZOCOR) 20 MG tablet Take 1 tablet (20 mg total) by mouth at bedtime. 06/20/17  Yes Lada, Satira Anis, MD  Lancets (ACCU-CHEK SOFT TOUCH) lancets Use as instructed Patient taking differently: 1 each by Other route 3 (three) times a week. Use as instructed 11/27/15   Lada, Satira Anis, MD  ONETOUCH VERIO test strip CHECK FINGERSTICK BLOOD SUGARS ONCE A DAY Patient taking differently: CHECK FINGERSTICK BLOOD SUGARS THREE TIMES A WEEK 04/23/16   Lada, Satira Anis, MD  PREVIDENT 5000 PLUS 1.1 % CREA dental cream Place 1 application onto teeth daily as needed.  10/13/16   [provider]  TURMERIC PO  Take 300 mg by mouth daily.    [provider]    Allergies as of 02/08/2018  . (No Known Allergies)    Family History  Problem Relation Age of Onset  . Stroke Mother   . Hypertension Mother   . Hyperlipidemia Mother   . Diabetes Mother        pre-diabetic  . Diabetes Brother   . Irregular heart beat Brother   . Thyroid disease Sister   . Stroke Maternal Grandmother   . Cancer Maternal Grandfather        bone  . Breast cancer Neg Hx   . Hematuria Neg Hx   . Prostate cancer Neg Hx   . Renal cancer Neg Hx     Social History   Socioeconomic History  . Marital status: Widowed    Spouse name:  Not on file  . Number of children: Not on file  . Years of education: Not on file  . Highest education level: Not on file  Occupational History  . Not on file  Social Needs  . Financial resource strain: Not on file  . Food insecurity:    Worry: Not on file    Inability: Not on file  . Transportation needs:    Medical: Not on file    Non-medical: Not on file  Tobacco Use  . Smoking status: Former Smoker    Packs/day: 3.00    Years: 45.00    Pack years: 135.00    Types: Cigarettes    Last attempt to quit: 10/25/2009    Years since quitting: 8.3  . Smokeless tobacco: Never Used  Substance and Sexual Activity  . Alcohol use: Yes    Alcohol/week: 0.0 oz    Comment: Rarely  . Drug use: No  . Sexual activity: Not Currently  Lifestyle  . Physical activity:    Days per week: Not on file    Minutes per session: Not on file  . Stress: Not on file  Relationships  . Social connections:    Talks on phone: Not on file    Gets together: Not on file    Attends religious service: Not on file    Active member of club or organization: Not on file    Attends meetings of clubs or organizations: Not on file    Relationship status: Not on file  . Intimate partner violence:    Fear of current or ex partner: Not on file    Emotionally abused: Not on file    Physically abused: Not on file    Forced sexual activity: Not on file  Other Topics Concern  . Not on file  Social History Narrative  . Not on file    Review of Systems: See HPI, otherwise negative ROS  Physical Exam: BP (!) 112/58   Pulse 88   Temp (!) 97.2 F (36.2 C)   Ht 5\' 8"  (1.727 m)   Wt 179 lb (81.2 kg)   SpO2 100%   BMI 27.22 kg/m  General:   Alert,  pleasant and cooperative in NAD Head:  Normocephalic and atraumatic. Neck:  Supple; no masses or thyromegaly. Lungs:  Clear throughout to auscultation.    Heart:  Regular rate and rhythm. Abdomen:  Soft, nontender and nondistended. Normal bowel sounds, without  guarding, and without rebound.   Neurologic:  Alert and  oriented x4;  grossly normal neurologically.  Impression/Plan: Felicia Cisneros is now here to undergo a screening colonoscopy.  Risks, benefits, and  alternatives regarding colonoscopy have been reviewed with the patient.  Questions have been answered.  All parties agreeable.

## 2018-02-21 ENCOUNTER — Encounter: Payer: Self-pay | Admitting: Gastroenterology

## 2018-02-22 ENCOUNTER — Encounter: Payer: Self-pay | Admitting: Gastroenterology

## 2018-02-24 ENCOUNTER — Other Ambulatory Visit: Payer: Self-pay | Admitting: Family Medicine

## 2018-02-27 DIAGNOSIS — I700 Atherosclerosis of aorta: Secondary | ICD-10-CM | POA: Diagnosis not present

## 2018-02-27 DIAGNOSIS — E1121 Type 2 diabetes mellitus with diabetic nephropathy: Secondary | ICD-10-CM | POA: Diagnosis not present

## 2018-02-27 DIAGNOSIS — Z79899 Other long term (current) drug therapy: Secondary | ICD-10-CM | POA: Diagnosis not present

## 2018-02-27 DIAGNOSIS — I10 Essential (primary) hypertension: Secondary | ICD-10-CM | POA: Diagnosis not present

## 2018-02-27 DIAGNOSIS — E785 Hyperlipidemia, unspecified: Secondary | ICD-10-CM | POA: Diagnosis not present

## 2018-02-28 ENCOUNTER — Ambulatory Visit
Admission: EM | Admit: 2018-02-28 | Discharge: 2018-02-28 | Disposition: A | Discharge status: Home / Self Care | Payer: Medicare Other | Attending: Family Medicine | Admitting: Family Medicine

## 2018-02-28 ENCOUNTER — Other Ambulatory Visit: Payer: Self-pay

## 2018-02-28 DIAGNOSIS — J029 Acute pharyngitis, unspecified: Secondary | ICD-10-CM

## 2018-02-28 DIAGNOSIS — J069 Acute upper respiratory infection, unspecified: Secondary | ICD-10-CM | POA: Diagnosis not present

## 2018-02-28 LAB — LIPID PANEL
CHOL/HDL RATIO: 2.8 (calc) (ref <5.0)
CHOLESTEROL: 145 mg/dL (ref <200)
HDL: 52 mg/dL (ref >50)
LDL Cholesterol (Calc): 74 mg/dL (calc)
Non-HDL Cholesterol (Calc): 93 mg/dL (calc) (ref <130)
Triglycerides: 107 mg/dL (ref <150)

## 2018-02-28 LAB — COMPLETE METABOLIC PANEL WITH GFR
AG RATIO: 1.6 (calc) (ref 1.0–2.5)
ALT: 19 U/L (ref 6–29)
AST: 16 U/L (ref 10–35)
Albumin: 4.2 g/dL (ref 3.6–5.1)
Alkaline phosphatase (APISO): 93 U/L (ref 33–130)
BUN: 20 mg/dL (ref 7–25)
CALCIUM: 9.6 mg/dL (ref 8.6–10.4)
CO2: 30 mmol/L (ref 20–32)
Chloride: 105 mmol/L (ref 98–110)
Creat: 0.78 mg/dL (ref 0.50–0.99)
GFR, EST AFRICAN AMERICAN: 91 mL/min/{1.73_m2} (ref >=60)
GFR, EST NON AFRICAN AMERICAN: 79 mL/min/{1.73_m2} (ref >=60)
GLOBULIN: 2.6 g/dL (ref 1.9–3.7)
Glucose, Bld: 124 mg/dL — ABNORMAL HIGH (ref 65–99)
Potassium: 5.2 mmol/L (ref 3.5–5.3)
SODIUM: 140 mmol/L (ref 135–146)
Total Bilirubin: 0.5 mg/dL (ref 0.2–1.2)
Total Protein: 6.8 g/dL (ref 6.1–8.1)

## 2018-02-28 LAB — RAPID STREP SCREEN (MED CTR MEBANE ONLY): STREPTOCOCCUS, GROUP A SCREEN (DIRECT): NEGATIVE

## 2018-02-28 LAB — HEMOGLOBIN A1C
EAG (MMOL/L): 6.8 (calc)
HEMOGLOBIN A1C: 5.9 %{Hb} — AB (ref <5.7)
Mean Plasma Glucose: 123 (calc)

## 2018-02-28 NOTE — Discharge Instructions (Addendum)
Rest. Drink plenty of fluids.  ° °Follow up with your primary care physician this week as needed. Return to Urgent care for new or worsening concerns.  ° °

## 2018-02-28 NOTE — ED Triage Notes (Signed)
Patient complains of sore throat and painful swallowing since Friday pm.

## 2018-02-28 NOTE — ED Provider Notes (Signed)
[facility] ____________________________________________  Time seen: Approximately 12:52 PM  I have reviewed the triage vital signs and the nursing notes.   HISTORY  Chief Complaint Sore Throat   HPI Felicia Cisneros is a 67 y.o. female presenting for evaluation of 4 days of sore throat.  Reports she has also had some nasal congestion and sneezing accompanying this.  Reports her sister has recently been sick and unsure if she may have picked up something from her.  Denies contacts with strep throat to her knowledge.  States sore throat is biggest complaint.  States sore throat currently mild to moderate.  Does hurt to swallow, has overall continue to eat and drink well.  Denies fevers.  Has tried some over-the-counter lozenges without much change in symptoms.  Denies other aggravating or alleviating factors. Denies chest pain, shortness of breath, abdominal pain, or rash. Denies recent sickness. Denies recent antibiotic use.  Does also report that she had a standard colonoscopy last Monday, but reports no sore throat between Monday and Friday.  Arnetha Courser, MD: PCP   Past Medical History:  Diagnosis Date  . Aortic atherosclerosis ([place])   . Atrophic vaginitis   . COPD (chronic obstructive pulmonary disease) (HCC)    PT STATES SHE WAS TOLD THIS AFTER HAVING CT SCAN  . Diabetes mellitus without complication ([place])   . Diverticulitis   . DNAR (do not attempt resuscitation) 06/09/2017   Discussed today, 06/09/2017  . GERD (gastroesophageal reflux disease)   . Heartburn   . History of kidney stones   . History of tobacco use   . Hyperlipidemia   . IFG (impaired fasting glucose)   . Irregular heart beat 2018   WAS RECENTLY TOLD THIS IN DR Audree Bane OFFICE-PT UNAWARE OF THIS WHEN IT HAPPENS  . Leukorrhea, not specified as infective   . Osteopenia 05/27/2016   August 2017  . Personal history of tobacco use, presenting hazards to health 08/20/2015  . Unspecified essential  hypertension     Patient Active Problem List   Diagnosis Date Noted  . Special screening for malignant neoplasms, colon   . Benign neoplasm of sigmoid colon   . Lower extremity edema 01/31/2018  . DNAR (do not attempt resuscitation) 06/09/2017  . Need for vaccination with 13-polyvalent pneumococcal conjugate vaccine 06/09/2017  . Centrilobular emphysema ([place]) 12/02/2016  . Pulmonary nodules/lesions, multiple 12/02/2016  . Aortic atherosclerosis ([place]) 07/08/2016  . Osteopenia 05/27/2016  . Breast cancer screening 04/26/2016  . Need for shingles vaccine 04/01/2016  . Abnormality of nail tissue 11/27/2015  . Medication monitoring encounter 11/27/2015  . Needs flu shot 08/22/2015  . Preventative health care 08/22/2015  . Colon cancer screening 08/22/2015  . Herpes simplex antibody positive 08/22/2015  . Personal history of tobacco use, presenting hazards to health 08/20/2015  . Essential hypertension   . Type 2 diabetes mellitus, controlled ([place])   . Atrophic vaginitis   . History of tobacco use   . Abnormal EKG 07/13/2013  . Dizziness 07/13/2013  . Hyperlipidemia 07/13/2013    Past Surgical History:  Procedure Laterality Date  . colonoscopy    . COLONOSCOPY WITH PROPOFOL N/A 02/20/2018   Procedure: COLONOSCOPY WITH PROPOFOL;  Surgeon: Lucilla Lame, MD;  Location: [facility];  Service: Endoscopy;  Laterality: N/A;  Diabetic  . CYSTOSCOPY WITH BIOPSY N/A 01/17/2017   Procedure: CYSTOSCOPY WITH BIOPSY;  Surgeon: Hollice Espy, MD;  Location: [facility];  Service: Urology;  Laterality: N/A;  . POLYPECTOMY  02/20/2018  Procedure: POLYPECTOMY;  Surgeon: Lucilla Lame, MD;  Location: [facility];  Service: Endoscopy;;  . TMJ ARTHROPLASTY  1984     No current facility-administered medications for this encounter.   Current Outpatient Medications:  .  acetaminophen (TYLENOL) 500 MG tablet, Take 1,000 mg by mouth every 6 (six) hours as needed (for pain.)., Disp: , Rfl:    .  aspirin EC 81 MG tablet, Take 81 mg by mouth at bedtime., Disp: , Rfl:  .  CINNAMON PO, Take 200 mg by mouth 2 (two) times daily. , Disp: , Rfl:  .  Coenzyme Q10 (COQ10) 400 MG CAPS, Take 400 mg by mouth daily., Disp: , Rfl:  .  Lancets (ACCU-CHEK SOFT TOUCH) lancets, Use as instructed (Patient taking differently: 1 each by Other route 3 (three) times a week. Use as instructed), Disp: 100 each, Rfl: 12 .  lisinopril (PRINIVIL,ZESTRIL) 5 MG tablet, TAKE 1 TABLET BY MOUTH  DAILY FOR BLOOD PRESSURE, Disp: 90 tablet, Rfl: 1 .  metFORMIN (GLUCOPHAGE-XR) 500 MG 24 hr tablet, TAKE 1 TABLET BY MOUTH  DAILY WITH BREAKFAST, Disp: 90 tablet, Rfl: 0 .  Multiple Vitamin (MULTIVITAMIN WITH MINERALS) TABS tablet, Take 1 tablet by mouth daily. CENTRUM SILVER FOR ADULTS 50+, Disp: , Rfl:  .  ONETOUCH VERIO test strip, CHECK FINGERSTICK BLOOD SUGARS ONCE A DAY (Patient taking differently: CHECK FINGERSTICK BLOOD SUGARS THREE TIMES A WEEK), Disp: 100 each, Rfl: 1 .  PREVIDENT 5000 PLUS 1.1 % CREA dental cream, Place 1 application onto teeth daily as needed. , Disp: , Rfl:  .  Probiotic Product (TRUBIOTICS) CAPS, Take 1 capsule by mouth daily., Disp: , Rfl:  .  ranitidine (ZANTAC) 150 MG tablet, Take 150 mg by mouth daily. , Disp: , Rfl:  .  simvastatin (ZOCOR) 20 MG tablet, Take 1 tablet (20 mg total) by mouth at bedtime., Disp: 90 tablet, Rfl: 3 .  Melatonin 2.5 MG CHEW, Chew 2.5 mg by mouth daily as needed., Disp: , Rfl:  .  TURMERIC PO, Take 300 mg by mouth daily., Disp: , Rfl:   Allergies Patient has no known allergies.  Family History  Problem Relation Age of Onset  . Stroke Mother   . Hypertension Mother   . Hyperlipidemia Mother   . Diabetes Mother        pre-diabetic  . Diabetes Brother   . Irregular heart beat Brother   . Thyroid disease Sister   . Stroke Maternal Grandmother   . Cancer Maternal Grandfather        bone  . Breast cancer Neg Hx   . Hematuria Neg Hx   . Prostate cancer Neg  Hx   . Renal cancer Neg Hx     Social History Social History   Tobacco Use  . Smoking status: Former Smoker    Packs/day: 3.00    Years: 45.00    Pack years: 135.00    Types: Cigarettes    Last attempt to quit: 10/25/2009    Years since quitting: 8.3  . Smokeless tobacco: Never Used  Substance Use Topics  . Alcohol use: Yes    Alcohol/week: 0.0 oz    Comment: Rarely  . Drug use: No    Review of Systems Constitutional: No fever/chills ENT: AS above. Cardiovascular: Denies chest pain. Respiratory: Denies shortness of breath. Gastrointestinal: No abdominal pain.   Musculoskeletal: Negative for back pain. Skin: Negative for rash.   ____________________________________________   PHYSICAL EXAM:  VITAL SIGNS: ED Triage Vitals  Enc Vitals Group     BP 02/28/18 1133 135/69     Pulse Rate 02/28/18 1133 77     Resp 02/28/18 1133 17     Temp 02/28/18 1133 98.4 F (36.9 C)     Temp Source 02/28/18 1133 Oral     SpO2 02/28/18 1133 98 %     Weight 02/28/18 1129 179 lb (81.2 kg)     Height 02/28/18 1129 5\' 8"  (1.727 m)     Head Circumference --      Peak Flow --      Pain Score 02/28/18 1129 9     Pain Loc --      Pain Edu? --      Excl. in [place]? --     Constitutional: Alert and oriented. Well appearing and in no acute distress. Eyes: Conjunctivae are normal.  Head: Atraumatic. No sinus tenderness to palpation. No swelling. No erythema.  Ears: no erythema, normal TMs bilaterally.   Nose:Nasal congestion with clear rhinorrhea  Mouth/Throat: Mucous membranes are moist. Mild pharyngeal erythema. No tonsillar swelling or exudate.  Neck: No stridor.  No cervical spine tenderness to palpation. Hematological/Lymphatic/Immunilogical: No cervical lymphadenopathy. Cardiovascular: Normal rate, regular rhythm. Grossly normal heart sounds.  Good peripheral circulation. Respiratory: Normal respiratory effort.  No retractions. No wheezes, rales or rhonchi. Good air movement.   Musculoskeletal: Ambulatory with steady gait. No cervical, thoracic or lumbar tenderness to palpation. Neurologic:  Normal speech and language. No gait instability. Skin:  Skin appears warm, dry and intact. No rash noted. Psychiatric: Mood and affect are normal. Speech and behavior are normal.  ___________________________________________   LABS (all labs ordered are listed, but only abnormal results are displayed)  Labs Reviewed  RAPID STREP SCREEN ([facility] ONLY)  CULTURE, GROUP A STREP [facility]    PROCEDURES Procedures    INITIAL IMPRESSION / ASSESSMENT AND PLAN / ED COURSE  Pertinent labs & imaging results that were available during my care of the patient were reviewed by me and considered in my medical decision making (see chart for details).  Well-appearing patient.  No acute distress.  Suspect viral upper respiratory infection and viral pharyngitis.  Quick strep negative, will culture.  Encourage rest, fluids, supportive care.  Discuss Rx for viscous lidocaine, patient declined.  Over-the-counter Claritin and lozenges as needed.Discussed indication, risks and benefits of medications with patient.  Discussed follow up with Primary care physician this week. Discussed follow up and return parameters including no resolution or any worsening concerns. Patient verbalized understanding and agreed to plan.   ____________________________________________   FINAL CLINICAL IMPRESSION(S) / ED DIAGNOSES  Final diagnoses:  Pharyngitis, unspecified etiology  Upper respiratory tract infection, unspecified type     ED Discharge Orders    None       Note: This dictation was prepared with Dragon dictation along with smaller phrase technology. Any transcriptional errors that result from this process are unintentional.         Marylene Land, NP 02/28/18 1254

## 2018-03-01 ENCOUNTER — Ambulatory Visit: Payer: Medicare Other | Admitting: Family Medicine

## 2018-03-01 ENCOUNTER — Ambulatory Visit: Payer: Self-pay

## 2018-03-01 ENCOUNTER — Encounter: Payer: Self-pay | Admitting: Family Medicine

## 2018-03-01 VITALS — BP 134/80 | HR 90 | Temp 98.6°F | Resp 16 | Ht 68.0 in | Wt 173.1 lb

## 2018-03-01 DIAGNOSIS — J0110 Acute frontal sinusitis, unspecified: Secondary | ICD-10-CM | POA: Diagnosis not present

## 2018-03-01 MED ORDER — AMOXICILLIN-POT CLAVULANATE 875-125 MG PO TABS: 1.0000 | ORAL_TABLET | Freq: Two times a day (BID) | ORAL | 0 refills | Status: AC

## 2018-03-01 NOTE — Telephone Encounter (Signed)
Pt. C/o sore throat since Friday and has gotten worse. Went to urgent care "but they didn't do much for me." C/o severe sore throat with swollen glands. No fever. Swallowing liquids. "I think I panicked a little this morning and I got a little short of breath.I'm OK now." Appointment made for today.  Reason for Disposition . SEVERE (e.g., excruciating) throat pain  Answer Assessment - Initial Assessment Questions 1. ONSET: "When did the throat start hurting?" (Hours or days ago)      Started Friday 2. SEVERITY: "How bad is the sore throat?" (Scale 1-10; mild, moderate or severe)   - MILD (1-3):  doesn't interfere with eating or normal activities   - MODERATE (4-7): interferes with eating some solids and normal activities   - SEVERE (8-10):  excruciating pain, interferes with most normal activities   - SEVERE DYSPHAGIA: can't swallow liquids, drooling     Moderate 3. STREP EXPOSURE: "Has there been any exposure to strep within the past week?" If so, ask: "What type of contact occurred?"      No 4.  VIRAL SYMPTOMS: "Are there any symptoms of a cold, such as a runny nose, cough, hoarse voice or red eyes?"      Swollen glands, some congestion 5. FEVER: "Do you have a fever?" If so, ask: "What is your temperature, how was it measured, and when did it start?"     No 6. PUS ON THE TONSILS: "Is there pus on the tonsils in the back of your throat?"     No 7. OTHER SYMPTOMS: "Do you have any other symptoms?" (e.g., difficulty breathing, headache, rash)     Slight shortness of breath 8. PREGNANCY: "Is there any chance you are pregnant?" "When was your last menstrual period?"     No  Protocols used: SORE THROAT-A-AH

## 2018-03-01 NOTE — Patient Instructions (Addendum)
Start the antibiotics Please do eat yogurt or kimchi or take a probiotic daily for the next month We want to replace the healthy germs in the gut If you notice foul, watery diarrhea in the next two months, schedule an appointment RIGHT AWAY or go to an urgent care or the emergency room if a holiday or over a weekend  Try vitamin C (orange juice if not diabetic or vitamin C tablets) and drink green tea to help your immune system during your illness Get plenty of rest and hydration Pharyngitis Pharyngitis is a sore throat (pharynx). There is redness, pain, and swelling of your throat. Follow these instructions at home:  Drink enough fluids to keep your pee (urine) clear or pale yellow.  Only take medicine as told by your doctor. ? You may get sick again if you do not take medicine as told. Finish your medicines, even if you start to feel better. ? Do not take aspirin.  Rest.  Rinse your mouth (gargle) with salt water ( tsp of salt per 1 qt of water) every 1-2 hours. This will help the pain.  If you are not at risk for choking, you can suck on hard candy or sore throat lozenges. Contact a doctor if:  You have large, tender lumps on your neck.  You have a rash.  You cough up green, yellow-brown, or bloody spit. Get help right away if:  You have a stiff neck.  You drool or cannot swallow liquids.  You throw up (vomit) or are not able to keep medicine or liquids down.  You have very bad pain that does not go away with medicine.  You have problems breathing (not from a stuffy nose). This information is not intended to replace advice given to you by your health care provider. Make sure you discuss any questions you have with your health care provider. Document Released: 03/29/2008 Document Revised: 03/18/2016 Document Reviewed: 06/18/2013 Elsevier Interactive Patient Education  2017 [employer].

## 2018-03-01 NOTE — Progress Notes (Signed)
BP 134/80   Pulse 90   Temp 98.6 F (37 C) (Oral)   Resp 16   Ht 5\' 8"  (1.727 m)   Wt 173 lb 1.6 oz (78.5 kg)   SpO2 95%   BMI 26.32 kg/m    Subjective:    Patient ID: Felicia Cisneros, female    DOB: 03-24-1951, 67 y.o.   MRN: [id number]  HPI: Felicia Cisneros is a 67 y.o. female  Chief Complaint  Patient presents with  . Sore Throat    onset 1 week, with swelling.  Was seen at urgent care yesterday strep was negative.    . Shortness of Breath    started last night called EMT this morning  . Dysphagia    HPI Here for sick visit Started last Friday; sore throat; terrible, can't swallow; nasal congestion and sneezing are gone No body aches like flu Went to urgent care, tested for strep, negative; [facility] yesterday; note reviewed Tried OTC lozenges, tea with honey and lemon, Hall's drops No fevers Called EMT at 5 am this morning, "couldn't breathe" and it scared her  Depression screen [facility] 2/9 03/01/2018 06/23/2017 06/09/2017 04/26/2016 02/27/2016  Decreased Interest 0 0 0 0 0  Down, Depressed, Hopeless 0 0 0 0 0  PHQ - 2 Score 0 0 0 0 0  Altered sleeping - - - - -  Tired, decreased energy - - - - -  Change in appetite - - - - -  Feeling bad or failure about yourself  - - - - -  Trouble concentrating - - - - -  Moving slowly or fidgety/restless - - - - -  Suicidal thoughts - - - - -  PHQ-9 Score - - - - -  Difficult doing work/chores - - - - -    Relevant past medical, surgical, family and social history reviewed Past Medical History:  Diagnosis Date  . Aortic atherosclerosis ([place])   . Atrophic vaginitis   . COPD (chronic obstructive pulmonary disease) (HCC)    PT STATES SHE WAS TOLD THIS AFTER HAVING CT SCAN  . Diabetes mellitus without complication ([place])   . Diverticulitis   . DNAR (do not attempt resuscitation) 06/09/2017   Discussed today, 06/09/2017  . GERD (gastroesophageal reflux disease)   . Heartburn   . History of kidney stones   . History of  tobacco use   . Hyperlipidemia   . IFG (impaired fasting glucose)   . Irregular heart beat 2018   WAS RECENTLY TOLD THIS IN DR Audree Bane OFFICE-PT UNAWARE OF THIS WHEN IT HAPPENS  . Leukorrhea, not specified as infective   . Osteopenia 05/27/2016   August 2017  . Personal history of tobacco use, presenting hazards to health 08/20/2015  . Unspecified essential hypertension    Past Surgical History:  Procedure Laterality Date  . colonoscopy    . COLONOSCOPY WITH PROPOFOL N/A 02/20/2018   Procedure: COLONOSCOPY WITH PROPOFOL;  Surgeon: Lucilla Lame, MD;  Location: [facility];  Service: Endoscopy;  Laterality: N/A;  Diabetic  . CYSTOSCOPY WITH BIOPSY N/A 01/17/2017   Procedure: CYSTOSCOPY WITH BIOPSY;  Surgeon: Hollice Espy, MD;  Location: [facility];  Service: Urology;  Laterality: N/A;  . POLYPECTOMY  02/20/2018   Procedure: POLYPECTOMY;  Surgeon: Lucilla Lame, MD;  Location: [facility];  Service: Endoscopy;;  . TMJ ARTHROPLASTY  1984   Family History  Problem Relation Age of Onset  . Stroke Mother   . Hypertension Mother   .  Hyperlipidemia Mother   . Diabetes Mother        pre-diabetic  . Diabetes Brother   . Irregular heart beat Brother   . Thyroid disease Sister   . Stroke Maternal Grandmother   . Cancer Maternal Grandfather        bone  . Breast cancer Neg Hx   . Hematuria Neg Hx   . Prostate cancer Neg Hx   . Renal cancer Neg Hx    Social History   Tobacco Use  . Smoking status: Former Smoker    Packs/day: 3.00    Years: 45.00    Pack years: 135.00    Types: Cigarettes    Last attempt to quit: 10/25/2009    Years since quitting: 8.3  . Smokeless tobacco: Never Used  Substance Use Topics  . Alcohol use: Yes    Alcohol/week: 0.0 oz    Comment: Rarely  . Drug use: No    Interim medical history since last visit reviewed. Allergies and medications reviewed  Review of Systems Per HPI unless specifically indicated above     Objective:    BP  134/80   Pulse 90   Temp 98.6 F (37 C) (Oral)   Resp 16   Ht 5\' 8"  (1.727 m)   Wt 173 lb 1.6 oz (78.5 kg)   SpO2 95%   BMI 26.32 kg/m   Wt Readings from Last 3 Encounters:  03/01/18 173 lb 1.6 oz (78.5 kg)  02/28/18 179 lb (81.2 kg)  02/20/18 179 lb (81.2 kg)    Physical Exam  Constitutional: She appears well-developed and well-nourished.  HENT:  Mouth/Throat: Mucous membranes are normal.  Eyes: EOM are normal. No scleral icterus.  Cardiovascular: Normal rate and regular rhythm.  Pulmonary/Chest: Effort normal and breath sounds normal. She has no decreased breath sounds. She has no wheezes. She has no rhonchi.  Psychiatric: She has a normal mood and affect. Her behavior is normal.    Results for orders placed or performed during the hospital encounter of 02/28/18  Rapid Strep Screen ([facility] ONLY)  Result Value Ref Range   Streptococcus, Group A Screen (Direct) NEGATIVE NEGATIVE  Culture, group A strep  Result Value Ref Range   Specimen Description      THROAT Performed at [facility], [address], [place], [place] [address]    Special Requests      NONE Reflexed from [id number] Performed at [facility], [address] [place], [place] [address]    Culture      NO GROUP A STREP (S.PYOGENES) ISOLATED Performed at [facility], [place] [address], [place], [place] [address]    Report Status 03/03/2018 FINAL       Assessment & Plan:   Problem List Items Addressed This Visit    None    Visit Diagnoses    Acute non-recurrent frontal sinusitis    -  Primary   treat with antibiotics; rest, hydration; if worsening, seek medical attention       Follow up plan: No follow-ups on file.  An after-visit summary was printed and given to the patient at [facility].  Please see the patient instructions which may contain other information and recommendations beyond what is mentioned above in the assessment and plan.  Meds ordered this  encounter  Medications  . amoxicillin-clavulanate (AUGMENTIN) 875-125 MG tablet    Sig: Take 1 tablet by mouth 2 (two) times daily for 10 days.  Dispense:  20 tablet    Refill:  0    No orders of the defined types were placed in this encounter.

## 2018-03-02 ENCOUNTER — Ambulatory Visit: Payer: Medicare Other | Admitting: Family Medicine

## 2018-03-03 LAB — CULTURE, GROUP A STREP (THRC)

## 2018-03-09 ENCOUNTER — Ambulatory Visit: Payer: Medicare Other | Admitting: Nurse Practitioner

## 2018-03-13 ENCOUNTER — Ambulatory Visit: Payer: Medicare Other | Admitting: Nurse Practitioner

## 2018-03-13 ENCOUNTER — Encounter: Payer: Self-pay | Admitting: Nurse Practitioner

## 2018-03-13 VITALS — BP 118/70 | HR 99 | Temp 98.6°F | Resp 16 | Ht 68.0 in | Wt 174.9 lb

## 2018-03-13 DIAGNOSIS — L03116 Cellulitis of left lower limb: Secondary | ICD-10-CM | POA: Diagnosis not present

## 2018-03-13 DIAGNOSIS — R600 Localized edema: Secondary | ICD-10-CM | POA: Diagnosis not present

## 2018-03-13 DIAGNOSIS — E1159 Type 2 diabetes mellitus with other circulatory complications: Secondary | ICD-10-CM

## 2018-03-13 NOTE — Patient Instructions (Signed)
Diabetes Mellitus and Nutrition When you have diabetes (diabetes mellitus), it is very important to have healthy eating habits because your blood sugar (glucose) levels are greatly affected by what you eat and drink. Eating healthy foods in the appropriate amounts, at about the same times every day, can help you:  Control your blood glucose.  Lower your risk of heart disease.  Improve your blood pressure.  Reach or maintain a healthy weight.  Every person with diabetes is different, and each person has different needs for a meal plan. Your health care provider may recommend that you work with a diet and nutrition specialist (dietitian) to make a meal plan that is best for you. Your meal plan may vary depending on factors such as:  The calories you need.  The medicines you take.  Your weight.  Your blood glucose, blood pressure, and cholesterol levels.  Your activity level.  Other health conditions you have, such as heart or kidney disease.  How do carbohydrates affect me? Carbohydrates affect your blood glucose level more than any other type of food. Eating carbohydrates naturally increases the amount of glucose in your blood. Carbohydrate counting is a method for keeping track of how many carbohydrates you eat. Counting carbohydrates is important to keep your blood glucose at a healthy level, especially if you use insulin or take certain oral diabetes medicines. It is important to know how many carbohydrates you can safely have in each meal. This is different for every person. Your dietitian can help you calculate how many carbohydrates you should have at each meal and for snack. Foods that contain carbohydrates include:  Bread, cereal, rice, pasta, and crackers.  Potatoes and corn.  Peas, beans, and lentils.  Milk and yogurt.  Fruit and juice.  Desserts, such as cakes, cookies, ice cream, and candy.  How does alcohol affect me? Alcohol can cause a sudden decrease in blood  glucose (hypoglycemia), especially if you use insulin or take certain oral diabetes medicines. Hypoglycemia can be a life-threatening condition. Symptoms of hypoglycemia (sleepiness, dizziness, and confusion) are similar to symptoms of having too much alcohol. If your health care provider says that alcohol is safe for you, follow these guidelines:  Limit alcohol intake to no more than 1 drink per day for nonpregnant women and 2 drinks per day for men. One drink equals 12 oz of beer, 5 oz of wine, or 1 oz of hard liquor.  Do not drink on an empty stomach.  Keep yourself hydrated with water, diet soda, or unsweetened iced tea.  Keep in mind that regular soda, juice, and other mixers may contain a lot of sugar and must be counted as carbohydrates.  What are tips for following this plan? Reading food labels  Start by checking the serving size on the label. The amount of calories, carbohydrates, fats, and other nutrients listed on the label are based on one serving of the food. Many foods contain more than one serving per package.  Check the total grams (g) of carbohydrates in one serving. You can calculate the number of servings of carbohydrates in one serving by dividing the total carbohydrates by 15. For example, if a food has 30 g of total carbohydrates, it would be equal to 2 servings of carbohydrates.  Check the number of grams (g) of saturated and trans fats in one serving. Choose foods that have low or no amount of these fats.  Check the number of milligrams (mg) of sodium in one serving. Most people   should limit total sodium intake to less than 2,300 mg per day.  Always check the nutrition information of foods labeled as "low-fat" or "nonfat". These foods may be higher in added sugar or refined carbohydrates and should be avoided.  Talk to your dietitian to identify your daily goals for nutrients listed on the label. Shopping  Avoid buying canned, premade, or processed foods. These  foods tend to be high in fat, sodium, and added sugar.  Shop around the outside edge of the grocery store. This includes fresh fruits and vegetables, bulk grains, fresh meats, and fresh dairy. Cooking  Use low-heat cooking methods, such as baking, instead of high-heat cooking methods like deep frying.  Cook using healthy oils, such as olive, canola, or sunflower oil.  Avoid cooking with butter, cream, or high-fat meats. Meal planning  Eat meals and snacks regularly, preferably at the same times every day. Avoid going long periods of time without eating.  Eat foods high in fiber, such as fresh fruits, vegetables, beans, and whole grains. Talk to your dietitian about how many servings of carbohydrates you can eat at each meal.  Eat 4-6 ounces of lean protein each day, such as lean meat, chicken, fish, eggs, or tofu. 1 ounce is equal to 1 ounce of meat, chicken, or fish, 1 egg, or 1/4 cup of tofu.  Eat some foods each day that contain healthy fats, such as avocado, nuts, seeds, and fish. Lifestyle   Check your blood glucose regularly.  Exercise at least 30 minutes 5 or more days each week, or as told by your health care provider.  Take medicines as told by your health care provider.  Do not use any products that contain nicotine or tobacco, such as cigarettes and e-cigarettes. If you need help quitting, ask your health care provider.  Work with a counselor or diabetes educator to identify strategies to manage stress and any emotional and social challenges. What are some questions to ask my health care provider?  Do I need to meet with a diabetes educator?  Do I need to meet with a dietitian?  What number can I call if I have questions?  When are the best times to check my blood glucose? Where to find more information:  American Diabetes Association: diabetes.org/food-and-fitness/food  Academy of Nutrition and Dietetics:  www.eatright.org/resources/health/diseases-and-conditions/diabetes  National Institute of Diabetes and Digestive and Kidney Diseases (NIH): www.niddk.nih.gov/health-information/diabetes/overview/diet-eating-physical-activity Summary  A healthy meal plan will help you control your blood glucose and maintain a healthy lifestyle.  Working with a diet and nutrition specialist (dietitian) can help you make a meal plan that is best for you.  Keep in mind that carbohydrates and alcohol have immediate effects on your blood glucose levels. It is important to count carbohydrates and to use alcohol carefully. This information is not intended to replace advice given to you by your health care provider. Make sure you discuss any questions you have with your health care provider. Document Released: 07/08/2005 Document Revised: 11/15/2016 Document Reviewed: 11/15/2016 Elsevier Interactive Patient Education  2018 Elsevier Inc.  

## 2018-03-13 NOTE — Progress Notes (Addendum)
Name: Felicia Cisneros   MRN: [id number]    DOB: 12/11/1950   Date:03/13/2018       Progress Note  Subjective  Chief Complaint  Chief Complaint  Patient presents with  . Follow-up    1 month recheck leg injury    HPI  Patient sustained leg injury in March and had resulting edema and cellulitis. Was rx doxycyline for cellulitis and referral to vascular, encouraged to wear compression and elevate legs. Infection cleared up with antibiotic and vascular referral completed recommending consistent compression trial and if ineffective would do further work-up.  Leg is much improved, no redness, trace edema, and pain is resolved. No problems walking. States has not been using compression stockings because they are hot.   Discussed A1C trending up. Pt takes metformin as rx. sts doesn't like to drink water but has switched to decaffeinated tea with stevia. She has been eating nutter butters and pecan sandies some morning if her sugar is low but normally has oatmeal.    Patient Active Problem List   Diagnosis Date Noted  . Special screening for malignant neoplasms, colon   . Benign neoplasm of sigmoid colon   . Lower extremity edema 01/31/2018  . DNAR (do not attempt resuscitation) 06/09/2017  . Need for vaccination with 13-polyvalent pneumococcal conjugate vaccine 06/09/2017  . Centrilobular emphysema ([place]) 12/02/2016  . Pulmonary nodules/lesions, multiple 12/02/2016  . Aortic atherosclerosis ([place]) 07/08/2016  . Osteopenia 05/27/2016  . Breast cancer screening 04/26/2016  . Need for shingles vaccine 04/01/2016  . Abnormality of nail tissue 11/27/2015  . Medication monitoring encounter 11/27/2015  . Needs flu shot 08/22/2015  . Preventative health care 08/22/2015  . Colon cancer screening 08/22/2015  . Herpes simplex antibody positive 08/22/2015  . Personal history of tobacco use, presenting hazards to health 08/20/2015  . Essential hypertension   . Type 2 diabetes mellitus, controlled  ([place])   . Atrophic vaginitis   . History of tobacco use   . Abnormal EKG 07/13/2013  . Dizziness 07/13/2013  . Hyperlipidemia 07/13/2013    Past Medical History:  Diagnosis Date  . Aortic atherosclerosis ([place])   . Atrophic vaginitis   . COPD (chronic obstructive pulmonary disease) (HCC)    PT STATES SHE WAS TOLD THIS AFTER HAVING CT SCAN  . Diabetes mellitus without complication ([place])   . Diverticulitis   . DNAR (do not attempt resuscitation) 06/09/2017   Discussed today, 06/09/2017  . GERD (gastroesophageal reflux disease)   . Heartburn   . History of kidney stones   . History of tobacco use   . Hyperlipidemia   . IFG (impaired fasting glucose)   . Irregular heart beat 2018   WAS RECENTLY TOLD THIS IN DR Audree Bane OFFICE-PT UNAWARE OF THIS WHEN IT HAPPENS  . Leukorrhea, not specified as infective   . Osteopenia 05/27/2016   August 2017  . Personal history of tobacco use, presenting hazards to health 08/20/2015  . Unspecified essential hypertension     Past Surgical History:  Procedure Laterality Date  . colonoscopy    . COLONOSCOPY WITH PROPOFOL N/A 02/20/2018   Procedure: COLONOSCOPY WITH PROPOFOL;  Surgeon: Lucilla Lame, MD;  Location: [facility];  Service: Endoscopy;  Laterality: N/A;  Diabetic  . CYSTOSCOPY WITH BIOPSY N/A 01/17/2017   Procedure: CYSTOSCOPY WITH BIOPSY;  Surgeon: Hollice Espy, MD;  Location: [facility];  Service: Urology;  Laterality: N/A;  . POLYPECTOMY  02/20/2018   Procedure: POLYPECTOMY;  Surgeon: Lucilla Lame, MD;  [facility];  Service: Endoscopy;;  . TMJ ARTHROPLASTY  1984    Social History   Tobacco Use  . Smoking status: Former Smoker    Packs/day: 3.00    Years: 45.00    Pack years: 135.00    Types: Cigarettes    Last attempt to quit: 10/25/2009    Years since quitting: 8.3  . Smokeless tobacco: Never Used  Substance Use Topics  . Alcohol use: Yes    Alcohol/week: 0.0 oz    Comment: Rarely     Current  Outpatient Medications:  .  acetaminophen (TYLENOL) 500 MG tablet, Take 1,000 mg by mouth every 6 (six) hours as needed (for pain.)., Disp: , Rfl:  .  aspirin EC 81 MG tablet, Take 81 mg by mouth at bedtime., Disp: , Rfl:  .  CINNAMON PO, Take 200 mg by mouth 2 (two) times daily. , Disp: , Rfl:  .  Coenzyme Q10 (COQ10) 400 MG CAPS, Take 400 mg by mouth daily., Disp: , Rfl:  .  Lancets (ACCU-CHEK SOFT TOUCH) lancets, Use as instructed (Patient taking differently: 1 each by Other route 3 (three) times a week. Use as instructed), Disp: 100 each, Rfl: 12 .  lisinopril (PRINIVIL,ZESTRIL) 5 MG tablet, TAKE 1 TABLET BY MOUTH  DAILY FOR BLOOD PRESSURE, Disp: 90 tablet, Rfl: 1 .  Melatonin 2.5 MG CHEW, Chew 2.5 mg by mouth daily as needed., Disp: , Rfl:  .  metFORMIN (GLUCOPHAGE-XR) 500 MG 24 hr tablet, TAKE 1 TABLET BY MOUTH  DAILY WITH BREAKFAST, Disp: 90 tablet, Rfl: 0 .  Multiple Vitamin (MULTIVITAMIN WITH MINERALS) TABS tablet, Take 1 tablet by mouth daily. CENTRUM SILVER FOR ADULTS 50+, Disp: , Rfl:  .  ONETOUCH VERIO test strip, CHECK FINGERSTICK BLOOD SUGARS ONCE A DAY (Patient taking differently: CHECK FINGERSTICK BLOOD SUGARS THREE TIMES A WEEK), Disp: 100 each, Rfl: 1 .  PREVIDENT 5000 PLUS 1.1 % CREA dental cream, Place 1 application onto teeth daily as needed. , Disp: , Rfl:  .  Probiotic Product (TRUBIOTICS) CAPS, Take 1 capsule by mouth daily., Disp: , Rfl:  .  ranitidine (ZANTAC) 150 MG tablet, Take 150 mg by mouth daily. , Disp: , Rfl:  .  simvastatin (ZOCOR) 20 MG tablet, Take 1 tablet (20 mg total) by mouth at bedtime., Disp: 90 tablet, Rfl: 3 .  TURMERIC PO, Take 300 mg by mouth daily., Disp: , Rfl:   No Known Allergies  ROS  No other specific complaints in a complete review of systems (except as listed in HPI above).  Objective  Vitals:   03/13/18 1432  BP: 118/70  Pulse: 99  Resp: 16  Temp: 98.6 F (37 C)  TempSrc: Oral  SpO2: 95%  Weight: 174 lb 14.4 oz (79.3 kg)   Height: 5\' 8"  (1.727 m)     Body mass index is 26.59 kg/m.  Nursing Note and Vital Signs reviewed.  Physical Exam   Constitutional: Patient appears well-developed and well-nourished. No distress.  Cardiovascular: Normal rate, regular rhythm, S1/S2 present.  No murmur or rub heard. Trace edema on left lower extremity Skin: no redness, or rash noted on left leg.  Pulmonary/Chest: Effort normal and breath sounds clear. No respiratory distress or retractions. Psychiatric: Patient has a normal mood and affect. behavior is normal. Judgment and thought content normal.  No results found for this or any previous visit (from the past 72 hour(s)).  Assessment & Plan  1. Cellulitis of left lower extremity -resovled  2. Leg  edema, left -resolving   3. Controlled type 2 diabetes mellitus with other circulatory complication, without long-term current use of insulin (HCC) -discussed diet, continue metformin    -Red flags and when to present for emergency care or RTC including fever >101.66F, chest pain, shortness of breath, new/worsening/un-resolving symptoms,  reviewed with patient at time of visit. Follow up and care instructions discussed and provided in AVS.  ------------------------------------- I have reviewed this encounter including the documentation in this note and/or discussed this patient with the provider, Suezanne Cheshire DNP AGNP-C. I am certifying that I agree with the content of this note as supervising physician. Enid Derry, [facility] 03/21/2018, 1:31 PM

## 2018-04-06 ENCOUNTER — Encounter: Payer: Self-pay | Admitting: Family Medicine

## 2018-04-15 ENCOUNTER — Other Ambulatory Visit: Payer: Self-pay | Admitting: Family Medicine

## 2018-04-15 DIAGNOSIS — E119 Type 2 diabetes mellitus without complications: Secondary | ICD-10-CM

## 2018-04-16 NOTE — Telephone Encounter (Signed)
Last Cr and K+ reviewed; Rx approved 

## 2018-05-04 ENCOUNTER — Other Ambulatory Visit: Payer: Self-pay | Admitting: Family Medicine

## 2018-05-04 DIAGNOSIS — Z1231 Encounter for screening mammogram for malignant neoplasm of breast: Secondary | ICD-10-CM

## 2018-06-03 ENCOUNTER — Other Ambulatory Visit: Payer: Self-pay | Admitting: Family Medicine

## 2018-06-05 NOTE — Telephone Encounter (Signed)
Reviewed last sgpt and lipids; Rx approved

## 2018-06-08 ENCOUNTER — Ambulatory Visit
Admission: RE | Admit: 2018-06-08 | Discharge: 2018-06-08 | Disposition: A | Discharge status: Home / Self Care | Payer: Medicare Other | Source: Ambulatory Visit | Attending: Family Medicine | Admitting: Family Medicine

## 2018-06-08 DIAGNOSIS — Z1231 Encounter for screening mammogram for malignant neoplasm of breast: Secondary | ICD-10-CM | POA: Diagnosis not present

## 2018-06-29 ENCOUNTER — Ambulatory Visit (INDEPENDENT_AMBULATORY_CARE_PROVIDER_SITE_OTHER): Payer: Medicare Other

## 2018-06-29 VITALS — BP 112/70 | HR 75 | Temp 98.4°F | Resp 12 | Ht 68.0 in | Wt 177.7 lb

## 2018-06-29 DIAGNOSIS — E2839 Other primary ovarian failure: Secondary | ICD-10-CM | POA: Diagnosis not present

## 2018-06-29 DIAGNOSIS — Z23 Encounter for immunization: Secondary | ICD-10-CM

## 2018-06-29 DIAGNOSIS — Z0000 Encounter for general adult medical examination without abnormal findings: Secondary | ICD-10-CM

## 2018-06-29 NOTE — Patient Instructions (Signed)
Felicia Cisneros , Thank you for taking time to come for your Medicare Wellness Visit. I appreciate your ongoing commitment to your health goals. Please review the following plan we discussed and let me know if I can assist you in the future.   Screening recommendations/referrals: Colorectal Screening: Up to date Mammogram: Up to date Bone Density: Please call to schedule your appoitment  Vision and Dental Exams: Recommended annual ophthalmology exams for early detection of glaucoma and other disorders of the eye Recommended annual dental exams for proper oral hygiene  Diabetic Exams: Recommended annual diabetic eye exams for early detection of retinopathy Recommended annual diabetic foot exams for early detection of peripheral neuropathy.  Diabetic Eye Exam: Up to date Diabetic Foot Exam: Please schedule your appointment for completion  Vaccinations: Influenza vaccine: Completed today Pneumococcal vaccine: Up to date Tdap vaccine: Up to date Shingles vaccine: Please call your insurance company to determine your out of pocket expense for the Shingrix vaccine. You may also receive this vaccine at your local pharmacy or Health Dept.    Advanced directives: Please bring a copy of your POA (Power of Attorney) and/or Living Will to your next appointment.  Goals: Recommend to drink at least 6-8 8oz glasses of water per day.  Next appointment: Please schedule your Annual Wellness Visit with your Nurse Health Advisor in one year.  Preventive Care 67 Years and Older, Female Preventive care refers to lifestyle choices and visits with your health care provider that can promote health and wellness. What does preventive care include?  A yearly physical exam. This is also called an annual well check.  Dental exams once or twice a year.  Routine eye exams. Ask your health care provider how often you should have your eyes checked.  Personal lifestyle choices, including:  Daily care of your  teeth and gums.  Regular physical activity.  Eating a healthy diet.  Avoiding tobacco and drug use.  Limiting alcohol use.  Practicing safe sex.  Taking low-dose aspirin every day.  Taking vitamin and mineral supplements as recommended by your health care provider. What happens during an annual well check? The services and screenings done by your health care provider during your annual well check will depend on your age, overall health, lifestyle risk factors, and family history of disease. Counseling  Your health care provider may ask you questions about your:  Alcohol use.  Tobacco use.  Drug use.  Emotional well-being.  Home and relationship well-being.  Sexual activity.  Eating habits.  History of falls.  Memory and ability to understand (cognition).  Work and work Statistician.  Reproductive health. Screening  You may have the following tests or measurements:  Height, weight, and BMI.  Blood pressure.  Lipid and cholesterol levels. These may be checked every 5 years, or more frequently if you are over 67 years old.  Skin check.  Lung cancer screening. You may have this screening every year starting at age 13 if you have a 30-pack-year history of smoking and currently smoke or have quit within the past 15 years.  Fecal occult blood test (FOBT) of the stool. You may have this test every year starting at age 67.  Flexible sigmoidoscopy or colonoscopy. You may have a sigmoidoscopy every 5 years or a colonoscopy every 10 years starting at age 67.  Hepatitis C blood test.  Hepatitis B blood test.  Sexually transmitted disease (STD) testing.  Diabetes screening. This is done by checking your blood sugar (glucose) after you have  not eaten for a while (fasting). You may have this done every 1-3 years.  Bone density scan. This is done to screen for osteoporosis. You may have this done starting at age 67.  Mammogram. This may be done every 1-2 years. Talk  to your health care provider about how often you should have regular mammograms. Talk with your health care provider about your test results, treatment options, and if necessary, the need for more tests. Vaccines  Your health care provider may recommend certain vaccines, such as:  Influenza vaccine. This is recommended every year.  Tetanus, diphtheria, and acellular pertussis (Tdap, Td) vaccine. You may need a Td booster every 10 years.  Zoster vaccine. You may need this after age 67.  Pneumococcal 13-valent conjugate (PCV13) vaccine. One dose is recommended after age 30.  Pneumococcal polysaccharide (PPSV23) vaccine. One dose is recommended after age 67. Talk to your health care provider about which screenings and vaccines you need and how often you need them. This information is not intended to replace advice given to you by your health care provider. Make sure you discuss any questions you have with your health care provider. Document Released: 11/07/2015 Document Revised: 06/30/2016 Document Reviewed: 08/12/2015 Elsevier Interactive Patient Education  2017 [place] Prevention in the Home Falls can cause injuries. They can happen to people of all ages. There are many things you can do to make your home safe and to help prevent falls. What can I do on the outside of my home?  Regularly fix the edges of walkways and driveways and fix any cracks.  Remove anything that might make you trip as you walk through a door, such as a raised step or threshold.  Trim any bushes or trees on the path to your home.  Use bright outdoor lighting.  Clear any walking paths of anything that might make someone trip, such as rocks or tools.  Regularly check to see if handrails are loose or broken. Make sure that both sides of any steps have handrails.  Any raised decks and porches should have guardrails on the edges.  Have any leaves, snow, or ice cleared regularly.  Use sand or salt on  walking paths during winter.  Clean up any spills in your garage right away. This includes oil or grease spills. What can I do in the bathroom?  Use night lights.  Install grab bars by the toilet and in the tub and shower. Do not use towel bars as grab bars.  Use non-skid mats or decals in the tub or shower.  If you need to sit down in the shower, use a plastic, non-slip stool.  Keep the floor dry. Clean up any water that spills on the floor as soon as it happens.  Remove soap buildup in the tub or shower regularly.  Attach bath mats securely with double-sided non-slip rug tape.  Do not have throw rugs and other things on the floor that can make you trip. What can I do in the bedroom?  Use night lights.  Make sure that you have a light by your bed that is easy to reach.  Do not use any sheets or blankets that are too big for your bed. They should not hang down onto the floor.  Have a firm chair that has side arms. You can use this for support while you get dressed.  Do not have throw rugs and other things on the floor that can make you trip. What can  I do in the kitchen?  Clean up any spills right away.  Avoid walking on wet floors.  Keep items that you use a lot in easy-to-reach places.  If you need to reach something above you, use a strong step stool that has a grab bar.  Keep electrical cords out of the way.  Do not use floor polish or wax that makes floors slippery. If you must use wax, use non-skid floor wax.  Do not have throw rugs and other things on the floor that can make you trip. What can I do with my stairs?  Do not leave any items on the stairs.  Make sure that there are handrails on both sides of the stairs and use them. Fix handrails that are broken or loose. Make sure that handrails are as long as the stairways.  Check any carpeting to make sure that it is firmly attached to the stairs. Fix any carpet that is loose or worn.  Avoid having throw  rugs at the top or bottom of the stairs. If you do have throw rugs, attach them to the floor with carpet tape.  Make sure that you have a light switch at the top of the stairs and the bottom of the stairs. If you do not have them, ask someone to add them for you. What else can I do to help prevent falls?  Wear shoes that:  Do not have high heels.  Have rubber bottoms.  Are comfortable and fit you well.  Are closed at the toe. Do not wear sandals.  If you use a stepladder:  Make sure that it is fully opened. Do not climb a closed stepladder.  Make sure that both sides of the stepladder are locked into place.  Ask someone to hold it for you, if possible.  Clearly mark and make sure that you can see:  Any grab bars or handrails.  First and last steps.  Where the edge of each step is.  Use tools that help you move around (mobility aids) if they are needed. These include:  Canes.  Walkers.  Scooters.  Crutches.  Turn on the lights when you go into a dark area. Replace any light bulbs as soon as they burn out.  Set up your furniture so you have a clear path. Avoid moving your furniture around.  If any of your floors are uneven, fix them.  If there are any pets around you, be aware of where they are.  Review your medicines with your doctor. Some medicines can make you feel dizzy. This can increase your chance of falling. Ask your doctor what other things that you can do to help prevent falls. This information is not intended to replace advice given to you by your health care provider. Make sure you discuss any questions you have with your health care provider. Document Released: 08/07/2009 Document Revised: 03/18/2016 Document Reviewed: 11/15/2014 Elsevier Interactive Patient Education  2017 [employer].

## 2018-06-29 NOTE — Progress Notes (Signed)
Subjective:   Felicia Cisneros is a 67 y.o. female who presents for Medicare Annual (Subsequent) preventive examination.  Review of Systems:  N/A Cardiac Risk Factors include: advanced age (>28men, >62 women);dyslipidemia;hypertension;diabetes mellitus;sedentary lifestyle     Objective:     Vitals: BP 112/70 (BP Location: Left Arm, Patient Position: Sitting, Cuff Size: Normal)   Pulse 75   Temp 98.4 F (36.9 C) (Oral)   Resp 12   Ht 5\' 8"  (1.727 m)   Wt 177 lb 11.2 oz (80.6 kg)   SpO2 96%   BMI 27.02 kg/m   Body mass index is 27.02 kg/m.  Advanced Directives 06/29/2018 02/28/2018 02/20/2018 06/23/2017 06/09/2017 04/01/2017 01/17/2017  Does Patient Have a Medical Advance Directive? Yes Yes Yes Yes Yes No No  Type of Paramedic of [place];Living will [place];Living will [place];Living will - - - -  Does patient want to make changes to medical advance directive? - - - - - - -  Copy of [place] in Chart? No - copy requested - Yes - - - -  Would patient like information on creating a medical advance directive? - - - - - No - Patient declined No - Patient declined    Tobacco Social History   Tobacco Use  Smoking Status Former Smoker  . Packs/day: 3.00  . Years: 45.00  . Pack years: 135.00  . Types: Cigarettes  . Last attempt to quit: 10/25/2009  . Years since quitting: 8.6  Smokeless Tobacco Never Used  Tobacco Comment   smoking cessation materials not required     Counseling given: No Comment: smoking cessation materials not required  Clinical Intake:  Pre-visit preparation completed: Yes  Pain : No/denies pain   BMI - recorded: 27.02 Nutritional Status: BMI 25 -29 Overweight Nutritional Risks: None  Nutrition Risk Assessment: Has the patient had any N/V/D within the last 2 months?  No Does the patient have any non-healing wounds?  No Has the patient had any unintentional weight  loss or weight gain?  No  Is the patient diabetic?  Yes If diabetic, was a CBG obtained today?  No Did the patient bring in their glucometer from home?  No Comments: Pt monitors CBG's 2-3x's weekly. Denies any financial strains with the device or supplies.  Diabetic Exams: Diabetic Eye Exam: Completed 01/12/18.  Diabetic Foot Exam: Completed 06/23/17. Pt has been advised about the importance in completing this exam. Pt has been advised to schedule appt in Nov for completion by Dr. [name]  How often do you need to have someone help you when you read instructions, pamphlets, or other written materials from your doctor or pharmacy?: 1 - Never  Interpreter Needed?: No  Information entered by :: Idell Pickles, LPN  Past Medical History:  Diagnosis Date  . Aortic atherosclerosis ([place])   . Atrophic vaginitis   . COPD (chronic obstructive pulmonary disease) ([facility])    PT STATES SHE WAS TOLD THIS AFTER HAVING CT SCAN  . Diabetes mellitus without complication ([place])   . Diverticulitis   . DNAR (do not attempt resuscitation) 06/09/2017   Discussed today, 06/09/2017  . GERD (gastroesophageal reflux disease)   . Heartburn   . History of kidney stones   . History of tobacco use   . Hyperlipidemia   . IFG (impaired fasting glucose)   . Irregular heart beat 2018   WAS RECENTLY TOLD THIS IN DR Audree Bane OFFICE-PT UNAWARE OF THIS WHEN IT  HAPPENS  . Leukorrhea, not specified as infective   . Osteopenia 05/27/2016   August 2017  . Personal history of tobacco use, presenting hazards to health 08/20/2015  . Unspecified essential hypertension    Past Surgical History:  Procedure Laterality Date  . colonoscopy    . COLONOSCOPY WITH PROPOFOL N/A 02/20/2018   Procedure: COLONOSCOPY WITH PROPOFOL;  Surgeon: Lucilla Lame, MD;  Location: [facility];  Service: Endoscopy;  Laterality: N/A;  Diabetic  . CYSTOSCOPY WITH BIOPSY N/A 01/17/2017   Procedure: CYSTOSCOPY WITH BIOPSY;  Surgeon: Hollice Espy, MD;   Location: [facility];  Service: Urology;  Laterality: N/A;  . POLYPECTOMY  02/20/2018   Procedure: POLYPECTOMY;  Surgeon: Lucilla Lame, MD;  Location: [facility];  Service: Endoscopy;;  . TMJ ARTHROPLASTY  1984   Family History  Problem Relation Age of Onset  . Stroke Mother   . Hypertension Mother   . Hyperlipidemia Mother   . Diabetes Mother        pre-diabetic  . Diabetes Brother   . Irregular heart beat Brother   . Thyroid disease Sister   . Stroke Maternal Grandmother   . Cancer Maternal Grandfather        bone  . Cancer Sister        cystic fibroid carcinoma  . Ulcerative colitis Sister   . Breast cancer Neg Hx   . Hematuria Neg Hx   . Prostate cancer Neg Hx   . Renal cancer Neg Hx    Social History   Socioeconomic History  . Marital status: Widowed    Spouse name: Charlyne Mom  . Number of children: 0  . Years of education: some college  . Highest education level: 12th grade  Occupational History    Employer: [employer]  Social Needs  . Financial resource strain: Not hard at all  . Food insecurity:    Worry: Never true    Inability: Never true  . Transportation needs:    Medical: No    Non-medical: No  Tobacco Use  . Smoking status: Former Smoker    Packs/day: 3.00    Years: 45.00    Pack years: 135.00    Types: Cigarettes    Last attempt to quit: 10/25/2009    Years since quitting: 8.6  . Smokeless tobacco: Never Used  . Tobacco comment: smoking cessation materials not required  Substance and Sexual Activity  . Alcohol use: Yes    Alcohol/week: 0.0 standard drinks    Comment: Rarely  . Drug use: No  . Sexual activity: Not Currently  Lifestyle  . Physical activity:    Days per week: 2 days    Minutes per session: 60 min  . Stress: Not at all  Relationships  . Social connections:    Talks on phone: Patient refused    Gets together: Patient refused    Attends religious service: Patient refused    Active member of club or  organization: Patient refused    Attends meetings of clubs or organizations: Patient refused    Relationship status: Widowed  Other Topics Concern  . Not on file  Social History Narrative  . Not on file    Outpatient Encounter Medications as of 06/29/2018  Medication Sig  . acetaminophen (TYLENOL) 500 MG tablet Take 1,000 mg by mouth every 6 (six) hours as needed (for pain.).  Marland Kitchen aspirin EC 81 MG tablet Take 81 mg by mouth at bedtime.  Marland Kitchen CINNAMON PO Take 200 mg  by mouth 2 (two) times daily.   . Coenzyme Q10 (COQ10) 400 MG CAPS Take 400 mg by mouth daily.  . Collagen 500 MG CAPS Take 1 capsule by mouth 2 (two) times daily.  . Lancets (ACCU-CHEK SOFT TOUCH) lancets Use as instructed (Patient taking differently: 1 each by Other route 3 (three) times a week. Use as instructed)  . lisinopril (PRINIVIL,ZESTRIL) 5 MG tablet TAKE 1 TABLET BY MOUTH  DAILY FOR BLOOD PRESSURE  . Melatonin 2.5 MG CHEW Chew 2.5 mg by mouth daily as needed.  . metFORMIN (GLUCOPHAGE-XR) 500 MG 24 hr tablet TAKE 1 TABLET BY MOUTH  DAILY WITH BREAKFAST  . Multiple Vitamin (MULTIVITAMIN WITH MINERALS) TABS tablet Take 1 tablet by mouth daily. CENTRUM SILVER FOR ADULTS 50+  . Nutritional Supplements (NUTRITIONAL SUPPLEMENT PLUS PO) Take 1 tablet by mouth every evening.  Glory Rosebush VERIO test strip CHECK FINGERSTICK BLOOD SUGARS ONCE A DAY (Patient taking differently: CHECK FINGERSTICK BLOOD SUGARS THREE TIMES A WEEK)  . PREVIDENT 5000 PLUS 1.1 % CREA dental cream Place 1 application onto teeth daily as needed.   . Probiotic Product (TRUBIOTICS) CAPS Take 1 capsule by mouth daily.  . ranitidine (ZANTAC) 150 MG tablet Take 150 mg by mouth daily.   . simvastatin (ZOCOR) 20 MG tablet TAKE 1 TABLET BY MOUTH AT  BEDTIME   No facility-administered encounter medications on file as of 06/29/2018.     Activities of Daily Living In your present state of health, do you have any difficulty performing the following activities: 06/29/2018  03/01/2018  Hearing? N N  Comment denies hearing aids -  Vision? N N  Comment wears eyeglasses -  Difficulty concentrating or making decisions? Y N  Comment short term memory loss -  Walking or climbing stairs? N N  Dressing or bathing? N N  Doing errands, shopping? N N  Preparing Food and eating ? N -  Comment denies dentures -  Using the Toilet? N -  In the past six months, have you accidently leaked urine? N -  Do you have problems with loss of bowel control? N -  Managing your Medications? N -  Managing your Finances? N -  Housekeeping or managing your Housekeeping? N -  Some recent data might be hidden    Patient Care Team: Lada, Satira Anis, MD as PCP - General (Family Medicine) Oneta Rack, MD as Consulting Physician (Dermatology) Minna Merritts, MD as Consulting Physician (Cardiology) Lucilla Lame, MD as Consulting Physician (Gastroenterology)    Assessment:   This is a routine wellness examination for [place].  Exercise Activities and Dietary recommendations Current Exercise Habits: Structured exercise class, Type of exercise: strength training/weights, Time (Minutes): 60, Frequency (Times/Week): 2, Weekly Exercise (Minutes/Week): 120, Intensity: Mild, Exercise limited by: None identified  Goals    . DIET - INCREASE WATER INTAKE     Recommend to drink at least 6-8 8oz glasses of water per day.    . Increase water intake       Fall Risk Fall Risk  06/29/2018 03/01/2018 06/23/2017 06/09/2017 04/26/2016  Falls in the past year? Yes No No No No  Comment - - - - -  Number falls in past yr: 1 - - - -  Injury with Fall? No - - - -  Risk for fall due to : Impaired vision - - - -  Risk for fall due to: Comment wears eyeglasses - - - -  Follow up Falls evaluation completed;Education provided;Falls prevention  discussed - - - -   FALL RISK PREVENTION PERTAINING TO HOME: Is your home free of loose throw rugs in walkways, pet beds, electrical cords, etc? Yes Is there  adequate lighting in your home to reduce risk of falls?  Yes Are there stairs in or around your home WITH handrails? Yes  ASSISTIVE DEVICES UTILIZED TO PREVENT FALLS: Use of a cane, walker or w/c? No Grab bars in the bathroom? Yes  Shower chair or a place to sit while bathing? No An elevated toilet seat or a handicapped toilet? No  Timed Get Up and Go Performed: Yes. Pt ambulated 10 feet within 6 sec. Gait stead-fast and without the use of an assistive device. No intervention required at this time. Fall risk prevention has been discussed.  Community Resource Referral:  [place] [place] Referral not required at this time.   OR Community Resource Referral sent to Care Guide for installation of grab bars in the shower, shower chair or an elevated toilet seat.   OR Pt declined my offer to send Community Resource Referral to Care Guide for a shower chair or an elevated toilet seat.  Depression Screen PHQ 2/9 Scores 06/29/2018 03/01/2018 06/23/2017 06/09/2017  PHQ - 2 Score 0 0 0 0  PHQ- 9 Score 0 - - -     Cognitive Function     6CIT Screen 06/29/2018  What Year? 0 points  What month? 0 points  What time? 0 points  Count back from 20 0 points  Months in reverse 0 points  Repeat phrase 0 points  Total Score 0    Immunization History  Administered Date(s) Administered  . Influenza, High Dose Seasonal PF 06/12/2017, 06/29/2018  . Influenza,inj,Quad PF,6+ Mos 08/22/2015  . Influenza-Unspecified 06/15/2017  . Pneumococcal Conjugate-13 06/09/2017  . Pneumococcal Polysaccharide-23 11/27/2015  . Tdap 10/25/2008  . Zoster 02/27/2016    Qualifies for Shingles Vaccine? Yes. Zostavax completed 02/27/16. Due for Shingrix. Education has been provided regarding the importance of this vaccine. Pt has been advised to call insurance company to determine out of pocket expense. Advised may also receive vaccine at local pharmacy or Health Dept. Verbalized acceptance and understanding.  Screening  Tests Health Maintenance  Topic Date Due  . FOOT EXAM  06/23/2018  . HEMOGLOBIN A1C  08/30/2018  . TETANUS/TDAP  10/25/2018  . OPHTHALMOLOGY EXAM  01/13/2019  . MAMMOGRAM  06/09/2019  . PNA vac Low Risk Adult (2 of 2 - PPSV23) 11/26/2020  . COLONOSCOPY  02/21/2028  . INFLUENZA VACCINE  Completed  . DEXA SCAN  Completed  . Hepatitis C Screening  Completed    Cancer Screenings: Lung: Low Dose CT Chest recommended if Age 102-80 years, 30 pack-year currently smoking OR have quit w/in 15years. Patient does qualify. Last low dose CT completed 12/06/17. Will be scheduled for follow up Feb 2020 Breast:  Up to date on Mammogram? Yes. Completed 06/08/18. Repeat every year   Up to date of Bone Density/Dexa? No. Completed 05/27/16. Results reflect osteopenia. Repeat every 2 years. Ordered today. Provided with contact info and advised to schedule appt Colorectal: Completed 02/20/18. Repeat every 10 years  Additional Screenings: Hepatitis C Screening: Completed 08/22/15    Plan:  I have personally reviewed and addressed the Medicare Annual Wellness questionnaire and have noted the following in the patient's chart:  A. Medical and social history B. Use of alcohol, tobacco or illicit drugs  C. Current medications and supplements D. Functional ability and status E.  Nutritional status F.  Physical activity G. Advance directives H. List of other physicians I.  Hospitalizations, surgeries, and ER visits in previous 12 months J.  [place] such as hearing and vision if needed, cognitive and depression L. Referrals and appointments  In addition, I have reviewed and discussed with patient certain preventive protocols, quality metrics, and best practice recommendations. A written personalized care plan for preventive services as well as general preventive health recommendations were provided to patient.  See attached scanned questionnaire for additional information.   Signed,  Aleatha Borer, LPN Nurse Health Advisor

## 2018-08-10 ENCOUNTER — Emergency Department
Admission: EM | Admit: 2018-08-10 | Discharge: 2018-08-10 | Disposition: A | Discharge status: Home / Self Care | Payer: Medicare Other | Attending: Emergency Medicine | Admitting: Emergency Medicine

## 2018-08-10 ENCOUNTER — Encounter: Payer: Self-pay | Admitting: Emergency Medicine

## 2018-08-10 ENCOUNTER — Other Ambulatory Visit: Payer: Self-pay

## 2018-08-10 ENCOUNTER — Emergency Department: Payer: Medicare Other

## 2018-08-10 ENCOUNTER — Ambulatory Visit: Payer: Self-pay

## 2018-08-10 DIAGNOSIS — R079 Chest pain, unspecified: Secondary | ICD-10-CM | POA: Diagnosis not present

## 2018-08-10 DIAGNOSIS — R202 Paresthesia of skin: Secondary | ICD-10-CM | POA: Insufficient documentation

## 2018-08-10 DIAGNOSIS — Z7984 Long term (current) use of oral hypoglycemic drugs: Secondary | ICD-10-CM | POA: Insufficient documentation

## 2018-08-10 DIAGNOSIS — Z87891 Personal history of nicotine dependence: Secondary | ICD-10-CM | POA: Diagnosis not present

## 2018-08-10 DIAGNOSIS — Z79899 Other long term (current) drug therapy: Secondary | ICD-10-CM | POA: Insufficient documentation

## 2018-08-10 DIAGNOSIS — F419 Anxiety disorder, unspecified: Secondary | ICD-10-CM | POA: Insufficient documentation

## 2018-08-10 DIAGNOSIS — I10 Essential (primary) hypertension: Secondary | ICD-10-CM | POA: Diagnosis not present

## 2018-08-10 DIAGNOSIS — E119 Type 2 diabetes mellitus without complications: Secondary | ICD-10-CM | POA: Insufficient documentation

## 2018-08-10 DIAGNOSIS — D125 Benign neoplasm of sigmoid colon: Secondary | ICD-10-CM | POA: Diagnosis not present

## 2018-08-10 DIAGNOSIS — J449 Chronic obstructive pulmonary disease, unspecified: Secondary | ICD-10-CM | POA: Diagnosis not present

## 2018-08-10 DIAGNOSIS — Z7982 Long term (current) use of aspirin: Secondary | ICD-10-CM | POA: Diagnosis not present

## 2018-08-10 LAB — BASIC METABOLIC PANEL
Anion gap: 8 (ref 5–15)
BUN: 18 mg/dL (ref 8–23)
CHLORIDE: 106 mmol/L (ref 98–111)
CO2: 26 mmol/L (ref 22–32)
CREATININE: 0.7 mg/dL (ref 0.44–1.00)
Calcium: 9.2 mg/dL (ref 8.9–10.3)
GFR calc Af Amer: >60 mL/min (ref >60)
GFR calc non Af Amer: >60 mL/min (ref >60)
Glucose, Bld: 110 mg/dL — ABNORMAL HIGH (ref 70–99)
Potassium: 4.1 mmol/L (ref 3.5–5.1)
Sodium: 140 mmol/L (ref 135–145)

## 2018-08-10 LAB — CBC
HEMATOCRIT: 44.8 % (ref 36.0–46.0)
Hemoglobin: 14.9 g/dL (ref 12.0–15.0)
MCH: 30.6 pg (ref 26.0–34.0)
MCHC: 33.3 g/dL (ref 30.0–36.0)
MCV: 92 fL (ref 80.0–100.0)
NRBC: 0 % (ref 0.0–0.2)
Platelets: 230 10*3/uL (ref 150–400)
RBC: 4.87 MIL/uL (ref 3.87–5.11)
RDW: 13 % (ref 11.5–15.5)
WBC: 5.5 10*3/uL (ref 4.0–10.5)

## 2018-08-10 LAB — TROPONIN I
Troponin I: <0.03 ng/mL (ref <0.03)
Troponin I: <0.03 ng/mL (ref <0.03)

## 2018-08-10 MED ORDER — ASPIRIN 81 MG PO CHEW
324.0000 mg | CHEWABLE_TABLET | Freq: Once | ORAL | Status: AC
Start: 2018-08-10 — End: 2018-08-10
  Administered 2018-08-10: 324 mg via ORAL
  Filled 2018-08-10: qty 4

## 2018-08-10 NOTE — ED Provider Notes (Signed)
[facility] Emergency Department Provider Note  ____________________________________________  Time seen: Approximately 4:36 PM  I have reviewed the triage vital signs and the nursing notes.   HISTORY  Chief Complaint Chest Pain   HPI Felicia Cisneros is a 67 y.o. female with a history of COPD, diabetes, GERD, hyperlipidemia, hypertension who presents for evaluation of chest pain.  Patient reports 2 episodes of chest pain, the first 5 days ago and one this morning.  She describes the pain as a stabbing pain located in the center of her chest, sudden in onset that radiates to her jaw and is associated with tingling of both hands and feet.  The episode lasts 15 minutes and resolved without intervention.  No shortness of breath, nausea, diaphoresis, or dizziness.  Last episode was this morning.  She is asymptomatic at this time.  No personal or family history of heart disease, blood clots, recent travel immobilization, leg pain or swelling, hemoptysis, or exogenous hormones.  Patient is a former smoker.  Past Medical History:  Diagnosis Date  . Aortic atherosclerosis ([place])   . Atrophic vaginitis   . COPD (chronic obstructive pulmonary disease) (HCC)    PT STATES SHE WAS TOLD THIS AFTER HAVING CT SCAN  . Diabetes mellitus without complication ([place])   . Diverticulitis   . DNAR (do not attempt resuscitation) 06/09/2017   Discussed today, 06/09/2017  . GERD (gastroesophageal reflux disease)   . Heartburn   . History of kidney stones   . History of tobacco use   . Hyperlipidemia   . IFG (impaired fasting glucose)   . Irregular heart beat 2018   WAS RECENTLY TOLD THIS IN DR Audree Bane OFFICE-PT UNAWARE OF THIS WHEN IT HAPPENS  . Leukorrhea, not specified as infective   . Osteopenia 05/27/2016   August 2017  . Personal history of tobacco use, presenting hazards to health 08/20/2015  . Unspecified essential hypertension     Patient Active Problem List   Diagnosis  Date Noted  . Benign neoplasm of sigmoid colon   . Lower extremity edema 01/31/2018  . Need for vaccination with 13-polyvalent pneumococcal conjugate vaccine 06/09/2017  . Centrilobular emphysema ([place]) 12/02/2016  . Pulmonary nodules/lesions, multiple 12/02/2016  . Aortic atherosclerosis ([place]) 07/08/2016  . Osteopenia 05/27/2016  . Abnormality of nail tissue 11/27/2015  . Herpes simplex antibody positive 08/22/2015  . Essential hypertension   . Type 2 diabetes mellitus, controlled ([place])   . Atrophic vaginitis   . History of tobacco use   . Abnormal EKG 07/13/2013  . Hyperlipidemia 07/13/2013    Past Surgical History:  Procedure Laterality Date  . colonoscopy    . COLONOSCOPY WITH PROPOFOL N/A 02/20/2018   Procedure: COLONOSCOPY WITH PROPOFOL;  Surgeon: Lucilla Lame, MD;  Location: [place];  Service: Endoscopy;  Laterality: N/A;  Diabetic  . CYSTOSCOPY WITH BIOPSY N/A 01/17/2017   Procedure: CYSTOSCOPY WITH BIOPSY;  Surgeon: Hollice Espy, MD;  Location: [facility];  Service: Urology;  Laterality: N/A;  . POLYPECTOMY  02/20/2018   Procedure: POLYPECTOMY;  Surgeon: Lucilla Lame, MD;  Location: [place];  Service: Endoscopy;;  . TMJ ARTHROPLASTY  1984    Prior to Admission medications   Medication Sig Start Date End Date Taking? Authorizing Provider  acetaminophen (TYLENOL) 500 MG tablet Take 1,000 mg by mouth every 6 (six) hours as needed (for pain.).    [provider]  aspirin EC 81 MG tablet Take 81 mg by mouth at bedtime.  [provider]  CINNAMON PO Take 200 mg by mouth 2 (two) times daily.     [provider]  Coenzyme Q10 (COQ10) 400 MG CAPS Take 400 mg by mouth daily.    [provider]  Collagen 500 MG CAPS Take 1 capsule by mouth 2 (two) times daily.    [provider]  Lancets (ACCU-CHEK SOFT TOUCH) lancets Use as instructed Patient taking differently: 1 each by Other route 3 (three) times a week. Use as  instructed 11/27/15   Lada, Satira Anis, MD  lisinopril (PRINIVIL,ZESTRIL) 5 MG tablet TAKE 1 TABLET BY MOUTH  DAILY FOR BLOOD PRESSURE 04/16/18   Arnetha Courser, MD  Melatonin 2.5 MG CHEW Chew 2.5 mg by mouth daily as needed.    [provider]  metFORMIN (GLUCOPHAGE-XR) 500 MG 24 hr tablet TAKE 1 TABLET BY MOUTH  DAILY WITH BREAKFAST 06/05/18   Lada, Satira Anis, MD  Multiple Vitamin (MULTIVITAMIN WITH MINERALS) TABS tablet Take 1 tablet by mouth daily. CENTRUM SILVER FOR ADULTS 50+    [provider]  Nutritional Supplements (NUTRITIONAL SUPPLEMENT PLUS PO) Take 1 tablet by mouth every evening.    [provider]  ONETOUCH VERIO test strip CHECK FINGERSTICK BLOOD SUGARS ONCE A DAY Patient taking differently: CHECK FINGERSTICK BLOOD SUGARS THREE TIMES A WEEK 04/23/16   Lada, Satira Anis, MD  PREVIDENT 5000 PLUS 1.1 % CREA dental cream Place 1 application onto teeth daily as needed.  10/13/16   [provider]  Probiotic Product (TRUBIOTICS) CAPS Take 1 capsule by mouth daily.    [provider]  ranitidine (ZANTAC) 150 MG tablet Take 150 mg by mouth daily.     [provider]  simvastatin (ZOCOR) 20 MG tablet TAKE 1 TABLET BY MOUTH AT  BEDTIME 06/05/18   Lada, Satira Anis, MD    Allergies Patient has no known allergies.  Family History  Problem Relation Age of Onset  . Stroke Mother   . Hypertension Mother   . Hyperlipidemia Mother   . Diabetes Mother        pre-diabetic  . Diabetes Brother   . Irregular heart beat Brother   . Thyroid disease Sister   . Stroke Maternal Grandmother   . Cancer Maternal Grandfather        bone  . Cancer Sister        cystic fibroid carcinoma  . Ulcerative colitis Sister   . Breast cancer Neg Hx   . Hematuria Neg Hx   . Prostate cancer Neg Hx   . Renal cancer Neg Hx     Social History Social History   Tobacco Use  . Smoking status: Former Smoker    Packs/day: 3.00    Years: 45.00    Pack years:  135.00    Types: Cigarettes    Last attempt to quit: 10/25/2009    Years since quitting: 8.7  . Smokeless tobacco: Never Used  . Tobacco comment: smoking cessation materials not required  Substance Use Topics  . Alcohol use: Yes    Alcohol/week: 0.0 standard drinks    Comment: Rarely  . Drug use: No    Review of Systems  Constitutional: Negative for fever. Eyes: Negative for visual changes. ENT: Negative for sore throat. Neck: No neck pain  Cardiovascular: + chest pain. Respiratory: Negative for shortness of breath. Gastrointestinal: Negative for abdominal pain, vomiting or diarrhea. Genitourinary: Negative for dysuria. Musculoskeletal: Negative for back pain. Skin: Negative for rash. Neurological: Negative for  headaches, weakness or numbness. Psych: No SI or HI  ____________________________________________   PHYSICAL EXAM:  VITAL SIGNS: ED Triage Vitals [08/10/18 1422]  Enc Vitals Group     BP (!) 155/74     Pulse Rate 68     Resp 18     Temp 97.7 F (36.5 C)     Temp Source Oral     SpO2 98 %     Weight 175 lb (79.4 kg)     Height 5\' 8"  (1.727 m)     Head Circumference      Peak Flow      Pain Score 0     Pain Loc      Pain Edu?      Excl. in [place]?     Constitutional: Alert and oriented. Well appearing and in no apparent distress. HEENT:      Head: Normocephalic and atraumatic.         Eyes: Conjunctivae are normal. Sclera is non-icteric.       Mouth/Throat: Mucous membranes are moist.       Neck: Supple with no signs of meningismus. Cardiovascular: Regular rate and rhythm. No murmurs, gallops, or rubs. 2+ symmetrical distal pulses are present in all extremities. No JVD. Respiratory: Normal respiratory effort. Lungs are clear to auscultation bilaterally. No wheezes, crackles, or rhonchi.  Gastrointestinal: Soft, non tender, and non distended with positive bowel sounds. No rebound or guarding. Musculoskeletal: Nontender with normal range of motion in all  extremities. No edema, cyanosis, or erythema of extremities. Neurologic: Normal speech and language. Face is symmetric. Moving all extremities. No gross focal neurologic deficits are appreciated. Skin: Skin is warm, dry and intact. No rash noted. Psychiatric: Mood and affect are normal. Speech and behavior are normal.  ____________________________________________   LABS (all labs ordered are listed, but only abnormal results are displayed)  Labs Reviewed  BASIC METABOLIC PANEL - Abnormal; Notable for the following components:      Result Value   Glucose, Bld 110 (*)    All other components within normal limits  CBC  TROPONIN I  TROPONIN I   ____________________________________________  EKG  ED ECG REPORT I, Rudene Re, the attending physician, personally viewed and interpreted this ECG.  Normal sinus rhythm with occasional PVCs, rate of 66, normal intervals, normal axis, no ST elevations or depressions, Q waves in inferior leads.  Unchanged from prior. ____________________________________________  RADIOLOGY  I have personally reviewed the images performed during this visit and I agree with the Radiologist's read.   Interpretation by Radiologist:  Dg Chest 2 View  Result Date: 08/10/2018 CLINICAL DATA:  Chest pain started this morning. EXAM: CHEST - 2 VIEW COMPARISON:  CT chest 12/06/2017 FINDINGS: There is no focal parenchymal opacity. There is no pleural effusion or pneumothorax. The heart and mediastinal contours are unremarkable. The osseous structures are unremarkable. IMPRESSION: No active cardiopulmonary disease. Electronically Signed   By: Kathreen Devoid   On: 08/10/2018 14:51     ____________________________________________   PROCEDURES  Procedure(s) performed: None Procedures Critical Care performed:  None ____________________________________________   INITIAL IMPRESSION / ASSESSMENT AND PLAN / ED COURSE  67 y.o. female with a history of COPD,  diabetes, GERD, hyperlipidemia, hypertension who presents for evaluation of chest pain.  Patient with 2 intermittent episodes of sharp central chest pain radiating to her jaw 5 days apart.  Last one this morning.  She is currently asymptomatic.  Vitals are within normal limits with no tachycardia, tachypnea, hypoxia.  EKG showing no evidence of ischemia or dysrhythmias.  Chest x-ray showed no evidence of pneumothorax, pneumonia, pleural effusion, or abnormal mediastinum.  On exam patient is extremely well-appearing and in no distress, her exam is completely nonfocal.  First troponin is negative.  Will give a full dose of aspirin and monitor patient on telemetry.  Plan for repeat troponin in 3 hours.  If that is negative and patient remains asymptomatic plan for close follow-up with cardiologist for a stress test to rule out angina.  No clinical suspicion for PE or dissection at this time with full resolution of her symptoms, no risk factors for PE, no neurological deficits, no extremely elevated high blood pressure, normal chest x-ray.  GI etiology is also possible including gastritis, peptic ulcer disease, esophageal spasms, pancreatitis, or gallbladder disease.  At this time patient has normal abdominal exam with no tenderness.  Clinical Course as of Aug 10 1852  Thu Aug 10, 2018  1850 Second troponin is negative.  Patient remains chest pain-free.  Will discharge home with close follow-up with Dr. Rockey Situ.  Discussed strict return precautions for recurrent chest pain.  I also emailed Dr. Rockey Situ to ensure expedite follow-up.   [CV]    Clinical Course User Index [CV] Alfred Levins [place], MD     As part of my medical decision making, I reviewed the following data within the [facility] notes reviewed and incorporated, Labs reviewed , EKG interpreted , Old EKG reviewed, Old chart reviewed, Radiograph reviewed , Notes from prior ED visits and [place] Controlled Substance  Database    Pertinent labs & imaging results that were available during my care of the patient were reviewed by me and considered in my medical decision making (see chart for details).    ____________________________________________   FINAL CLINICAL IMPRESSION(S) / ED DIAGNOSES  Final diagnoses:  Chest pain, unspecified type      NEW MEDICATIONS STARTED DURING THIS VISIT:  ED Discharge Orders    None       Note:  This document was prepared using Dragon voice recognition software and may include unintentional dictation errors.    Rudene Re, MD 08/10/18 [phone]

## 2018-08-10 NOTE — Telephone Encounter (Signed)
Pt states will go to ER

## 2018-08-10 NOTE — ED Triage Notes (Signed)
Pt to ED from home c/o center chest pain that started this morning while sitting at desk, states pain went to right jaw, bilateral legs with some numbness, denies n/v/d, denies SOB or diaphoresis.  States takes baby ASA daily.  States had this same pain on Saturday.

## 2018-08-10 NOTE — ED Notes (Signed)
FIRST NURSE NOTE: pt c/o having 3 episodes of chest pain in the past week, last episode was this morning and she called EMS . States she called her PCP and was referred to the ED. Denies CP at  Present but states she just doesn't feel right

## 2018-08-10 NOTE — ED Notes (Addendum)
Pt states no current chest pain. States she has "a lot to be anxious about".

## 2018-08-10 NOTE — Telephone Encounter (Signed)
Getting checked out by EMS is not equivalent to getting checked out in the ER She needs to go to the ER now

## 2018-08-10 NOTE — Discharge Instructions (Signed)

## 2018-08-10 NOTE — Telephone Encounter (Signed)
Out going call to patient.  Patient states that She had an episode of moderate chest pain.  This morning where her " jaw locked up"  Summoned the ambulance, they did an EKG, took her vital signs. Patient states that " everything turn out fine. " wanted to make her PCP aware.  Patient states she is under a lot of stress.  Informed patient that I will route telephone encounter to Dr.  [name] 's office to make her aware.  Provided care advice. Patient voiced understanding.     Reason for Disposition . SEVERE chest pain  Answer Assessment - Initial Assessment Questions 1. LOCATION: "Where does it hurt?"       Middle of chest 2. RADIATION: "Does the pain go anywhere else?" (e.g., into neck, jaw, arms, back)     Jaw for a little bit" 3. ONSET: "When did the chest pain begin?" (Minutes, hours or days)      This morning 4. PATTERN "Does the pain come and go, or has it been constant since it started?"  "Does it get worse with exertion?"      *No Answer* 5. DURATION: "How long does it last" (e.g., seconds, minutes, hours)     10 min 6. SEVERITY: "How bad is the pain?"  (e.g., Scale 1-10; mild, moderate, or severe)    - MILD (1-3): doesn't interfere with normal activities     - MODERATE (4-7): interferes with normal activities or awakens from sleep    - SEVERE (8-10): excruciating pain, unable to do any normal activities       moderate 7. CARDIAC RISK FACTORS: "Do you have any history of heart problems or risk factors for heart disease?" (e.g., prior heart attack, angina; high blood pressure, diabetes, being overweight, high cholesterol, smoking, or strong family history of heart disease)     *No Answer* 8. PULMONARY RISK FACTORS: "Do you have any history of lung disease?"  (e.g., blood clots in lung, asthma, emphysema, birth control pills)    no 9. CAUSE: "What do you think is causing the chest pain?"     stress 10. OTHER SYMPTOMS: "Do you have any other symptoms?" (e.g., dizziness, nausea, vomiting,  sweating, fever, difficulty breathing, cough)       no 11. PREGNANCY: "Is there any chance you are pregnant?" "When was your last menstrual period?"       na  Protocols used: CHEST PAIN-A-AH

## 2018-08-11 ENCOUNTER — Telehealth: Payer: Self-pay | Admitting: Cardiovascular Disease

## 2018-08-11 NOTE — Telephone Encounter (Signed)
Scheduled 2023-09-29 for ED fu chest pain   Per MD in [facility] ED needs close follow-up with cardiologist for a stress test to rule out angina  Patient concerned she "could be dead by" 2023-09-29 fu and was told to fu in 1-2 days .  Added to waitlist

## 2018-08-11 NOTE — Telephone Encounter (Signed)
I spoke with the patient. She is agreeable with coming in on 08/15/18 at 11:00 am to see Ignacia Bayley, NP.   Appt has been r/s to this date.

## 2018-08-12 ENCOUNTER — Encounter: Payer: Self-pay | Admitting: Family Medicine

## 2018-08-15 ENCOUNTER — Encounter: Payer: Self-pay | Admitting: Nurse Practitioner

## 2018-08-15 ENCOUNTER — Ambulatory Visit: Payer: Medicare Other | Admitting: Nurse Practitioner

## 2018-08-15 VITALS — BP 112/60 | HR 82 | Ht 68.0 in | Wt 179.0 lb

## 2018-08-15 DIAGNOSIS — I10 Essential (primary) hypertension: Secondary | ICD-10-CM

## 2018-08-15 DIAGNOSIS — E119 Type 2 diabetes mellitus without complications: Secondary | ICD-10-CM | POA: Diagnosis not present

## 2018-08-15 DIAGNOSIS — R072 Precordial pain: Secondary | ICD-10-CM

## 2018-08-15 DIAGNOSIS — E782 Mixed hyperlipidemia: Secondary | ICD-10-CM

## 2018-08-15 NOTE — Progress Notes (Signed)
Office Visit    Patient Name: Felicia Cisneros Date of Encounter: 08/15/2018  Primary Care Provider:  Arnetha Courser, MD Primary Cardiologist:  Ida Rogue, MD  Chief Complaint    67 year old female with a history of hypertension, hyperlipidemia, pulmonary nodules, remote tobacco abuse, diabetes, GERD, and COPD, who presents for follow-up after recent ER visit for chest pain.  Past Medical History    Past Medical History:  Diagnosis Date  . Aortic atherosclerosis ([place])    a. 11/2017 noted on Chest CT - Aortic and branch vessel atherosclerosis.  . Atrophic vaginitis   . Chest pain    a. 06/2013 ETT: Ex time 6:00, Max HR 136 (87%), 7.1 METS. No ECG changes.  Marland Kitchen COPD (chronic obstructive pulmonary disease) ([place])    a. 11/2017 Chest CT: moderate centrilobular emphysema.  . Diabetes mellitus without complication ([place])   . Diverticulitis   . DNAR (do not attempt resuscitation) 06/09/2017   Discussed today, 06/09/2017  . Essential hypertension   . GERD (gastroesophageal reflux disease)   . History of kidney stones   . History of tobacco use   . Hyperlipidemia   . IFG (impaired fasting glucose)   . Irregular heart beat 2018  . Leukorrhea, not specified as infective   . Osteopenia 05/27/2016   August 2017  . Personal history of tobacco use, presenting hazards to health 08/20/2015  . Pulmonary nodules    a. 11/2017 CT Chest: No change in bilat pulm nodules, including an ant LUL nodule - 5.5mm.   Past Surgical History:  Procedure Laterality Date  . colonoscopy    . COLONOSCOPY WITH PROPOFOL N/A 02/20/2018   Procedure: COLONOSCOPY WITH PROPOFOL;  Surgeon: Lucilla Lame, MD;  Location: [place];  Service: Endoscopy;  Laterality: N/A;  Diabetic  . CYSTOSCOPY WITH BIOPSY N/A 01/17/2017   Procedure: CYSTOSCOPY WITH BIOPSY;  Surgeon: Hollice Espy, MD;  Location: [facility];  Service: Urology;  Laterality: N/A;  . POLYPECTOMY  02/20/2018   Procedure: POLYPECTOMY;  Surgeon:  Lucilla Lame, MD;  Location: [facility];  Service: Endoscopy;;  . TMJ ARTHROPLASTY  1984    Allergies  No Known Allergies  History of Present Illness    67 year old female with the above past medical history including hypertension, hyperlipidemia, pulmonary nodules, remote tobacco abuse, diabetes, GERD, COPD, remote dizziness and numbness with prior evaluation in 2014, osteoporosis, chronic toe pain, and insomnia.  In the setting of pulmonary nodules, she is enrolled in a research study where she had annual chest CTs.  CTs have previously shown aortic and branch vessel atherosclerosis without any significant coronary calcifications.  She has previously undergone stress testing in September 2014 which was normal.  She was in her usual state of health until early last week, when she was walking up some stairs and had sharp 8/10 retrosternal chest pain without associated symptoms.  She was able to keep on walking and symptoms resolved within 15 minutes.  Symptoms did not appear to worsen any with continued activity.  On the morning of October 17, she was sitting at her desk and had a recurrent episode of sharp chest pain with right sided jaw discomfort.  There were no associated symptoms.  She called EMS and was advised by the EMTs to call her primary care provider.  She was then advised to present to the ED with EMS.  Chest pain resolved prior to arrival to the ED, total duration 15 minutes.  In the emergency department, ECG was  nonacute.  Chest CTA was negative for PE.  Troponins were normal x2.  She was subsequently discharged and advised to follow-up with cardiology.  She has not had any recurrent chest pain.  She does not typically experience dyspnea on exertion and denies PND, orthopnea, dizziness, syncope, or early satiety.  She is chronic mild left ankle swelling.  Home Medications    Prior to Admission medications   Medication Sig Start Date End Date Taking? Authorizing Provider    acetaminophen (TYLENOL) 500 MG tablet Take 1,000 mg by mouth every 6 (six) hours as needed (for pain.).   Yes [provider]  aspirin EC 81 MG tablet Take 81 mg by mouth at bedtime.   Yes [provider]  CINNAMON PO Take 200 mg by mouth 2 (two) times daily.    Yes [provider]  Coenzyme Q10 (COQ10) 400 MG CAPS Take 400 mg by mouth daily.   Yes [provider]  Collagen 500 MG CAPS Take 1 capsule by mouth 2 (two) times daily.   Yes [provider]  Lancets (ACCU-CHEK SOFT TOUCH) lancets Use as instructed Patient taking differently: 1 each by Other route 3 (three) times a week. Use as instructed 11/27/15  Yes Lada, Satira Anis, MD  lisinopril (PRINIVIL,ZESTRIL) 5 MG tablet TAKE 1 TABLET BY MOUTH  DAILY FOR BLOOD PRESSURE 04/16/18  Yes Lada, Satira Anis, MD  Melatonin 2.5 MG CHEW Chew 2.5 mg by mouth daily as needed.   Yes [provider]  metFORMIN (GLUCOPHAGE-XR) 500 MG 24 hr tablet TAKE 1 TABLET BY MOUTH  DAILY WITH BREAKFAST 06/05/18  Yes Lada, Satira Anis, MD  Multiple Vitamin (MULTIVITAMIN WITH MINERALS) TABS tablet Take 1 tablet by mouth daily. CENTRUM SILVER FOR ADULTS 50+   Yes [provider]  Nutritional Supplements (NUTRITIONAL SUPPLEMENT PLUS PO) Take 1 tablet by mouth every evening.   Yes [provider]  ONETOUCH VERIO test strip CHECK FINGERSTICK BLOOD SUGARS ONCE A DAY Patient taking differently: CHECK FINGERSTICK BLOOD SUGARS THREE TIMES A WEEK 04/23/16  Yes Lada, Satira Anis, MD  PREVIDENT 5000 PLUS 1.1 % CREA dental cream Place 1 application onto teeth daily as needed.  10/13/16  Yes [provider]  Probiotic Product (TRUBIOTICS) CAPS Take 1 capsule by mouth daily.   Yes [provider]  ranitidine (ZANTAC) 150 MG tablet Take 150 mg by mouth daily.    Yes [provider]  simvastatin (ZOCOR) 20 MG tablet TAKE 1 TABLET BY MOUTH AT  BEDTIME 06/05/18  Yes Lada, Satira Anis, MD    Review of  Systems    2 episodes of sharp chest pain as outlined above.  She has chronic mild left ankle swelling.  She denies palpitations, dyspnea, PND, orthopnea, dizziness, syncope, or early satiety.  All other systems reviewed and are otherwise negative except as noted above.  Physical Exam    VS:  BP 112/60 (BP Location: Left Arm, Patient Position: Sitting, Cuff Size: Normal)   Pulse 82   Ht 5\' 8"  (1.727 m)   Wt 179 lb (81.2 kg)   BMI 27.22 kg/m  , BMI Body mass index is 27.22 kg/m. GEN: Well nourished, well developed, in no acute distress. HEENT: normal. Neck: Supple, no JVD, carotid bruits, or masses. Cardiac: RRR, no murmurs, rubs, or gallops. No clubbing, cyanosis, edema.  Radials/DP/PT 2+ and equal bilaterally.  Respiratory:  Respirations regular and unlabored, clear to auscultation bilaterally. GI: Soft, nontender, nondistended, BS + x 4. MS: no deformity  or atrophy. Skin: warm and dry, no rash. Neuro:  Strength and sensation are intact. Psych: Normal affect.  Accessory Clinical Findings    ECG personally reviewed by me today -regular sinus rhythm, 82, PVC, prior inferior infarct- no acute changes.  Assessment & Plan    1.  Precordial chest pain: Last week, patient had 2 episodes of sharp chest pain lasting 15 minutes and resolving spontaneously.  There were no associated symptoms.  She has not had any recurrent chest pain since her ER visit on October 17, at which time troponins and ECG were normal.  Risk factors for coronary disease include hypertension, hyperlipidemia, diabetes, and aortic atherosclerosis noted on chest CT earlier this year.  I will arrange for an exercise Myoview to rule out ischemia.  Continue aspirin and statin therapy.  2.  Essential hypertension: Stable on ACE inhibitor therapy.  3.  Hyperlipidemia: LDL 74 in May with normal LFTs.  Continue statin therapy.  4.  Type 2 diabetes mellitus: Hemoglobin A1c 5.9 in May.  She is on metformin and followed closely  by primary care.  5.  Pulmonary nodules/COPD: Followed with annual chest CTs by pulmonology.  Most recent in February showed stable pulmonary nodules.  6.  Disposition: Follow-up stress testing as outlined above.  Follow-up in 1 month or sooner if necessary.   Murray Hodgkins, NP 08/15/2018, 12:40 PM

## 2018-08-15 NOTE — Patient Instructions (Addendum)
Medication Instructions:  Your physician recommends that you continue on your current medications as directed. Please refer to the Current Medication list given to you today.  If you need a refill on your cardiac medications before your next appointment, please call your pharmacy.   Lab work: none If you have labs (blood work) drawn today and your tests are completely normal, you will receive your results only by: Marland Kitchen MyChart Message (if you have MyChart) OR . A paper copy in the mail If you have any lab test that is abnormal or we need to change your treatment, we will call you to review the results.  Testing/Procedures: Your physician has requested that you have en exercise stress myoview. For further information please visit [email or web address]. Please follow instruction sheet, as given.  [place]  Your caregiver has ordered a Stress Test with nuclear imaging. The purpose of this test is to evaluate the blood supply to your heart muscle. This procedure is referred to as a "Non-Invasive Stress Test." This is because other than having an IV started in your vein, nothing is inserted or "invades" your body. Cardiac stress tests are done to find areas of poor blood flow to the heart by determining the extent of coronary artery disease (CAD). Some patients exercise on a treadmill, which naturally increases the blood flow to your heart, while others who are  unable to walk on a treadmill due to physical limitations have a pharmacologic/chemical stress agent called Lexiscan . This medicine will mimic walking on a treadmill by temporarily increasing your coronary blood flow.   Please note: these test may take anywhere between 2-4 hours to complete  PLEASE REPORT TO [facility] AT THE FIRST DESK WILL DIRECT YOU WHERE TO GO  Date of Procedure:_____________________________________  Arrival Time for Procedure:______________________________  Instructions regarding  medication:   _XX_ : Hold diabetes medication morning of procedure -- METFORMIN   PLEASE NOTIFY THE OFFICE AT LEAST 24 HOURS IN ADVANCE IF YOU ARE UNABLE TO KEEP YOUR APPOINTMENT.  [phone] AND  PLEASE NOTIFY NUCLEAR MEDICINE AT [facility] AT LEAST 24 HOURS IN ADVANCE IF YOU ARE UNABLE TO KEEP YOUR APPOINTMENT. [phone]  How to prepare for your Myoview test:  1. Do not eat or drink after midnight 2. No caffeine for 24 hours prior to test 3. No smoking 24 hours prior to test. 4. Your medication may be taken with water.  If your doctor stopped a medication because of this test, do not take that medication. 5. Ladies, please do not wear dresses.  Skirts or pants are appropriate. Please wear a short sleeve shirt. 6. No perfume, cologne or lotion. 7. Wear comfortable walking shoes. No heels!   Follow-Up: At [facility], you and your health needs are our priority.  As part of our continuing mission to provide you with exceptional heart care, we have created designated Provider Care Teams.  These Care Teams include your primary Cardiologist (physician) and Advanced Practice Providers (APPs -  Physician Assistants and Nurse Practitioners) who all work together to provide you with the care you need, when you need it. You will need a follow up appointment in 1 months.  Please call our office 2 months in advance to schedule this appointment.  You may see DR Ida Rogue or one of the following Advanced Practice Providers on your designated Care Team:   Murray Hodgkins, NP Christell Faith, PA-C . Marrianne Mood, PA-C    Cardiac Nuclear Scan A cardiac  nuclear scan is a test that measures blood flow to the heart when a person is resting and when he or she is exercising. The test looks for problems such as:  Not enough blood reaching a portion of the heart.  The heart muscle not working normally.  You may need this test if:  You have heart disease.  You have had abnormal lab  results.  You have had heart surgery or angioplasty.  You have chest pain.  You have shortness of breath.  In this test, a radioactive dye (tracer) is injected into your bloodstream. After the tracer has traveled to your heart, an imaging device is used to measure how much of the tracer is absorbed by or distributed to various areas of your heart. This procedure is usually done at a hospital and takes 2-4 hours. Tell a health care provider about:  Any allergies you have.  All medicines you are taking, including vitamins, herbs, eye drops, creams, and over-the-counter medicines.  Any problems you or family members have had with the use of anesthetic medicines.  Any blood disorders you have.  Any surgeries you have had.  Any medical conditions you have.  Whether you are pregnant or may be pregnant. What are the risks? Generally, this is a safe procedure. However, problems may occur, including:  Serious chest pain and heart attack. This is only a risk if the stress portion of the test is done.  Rapid heartbeat.  Sensation of warmth in your chest. This usually passes quickly.  What happens before the procedure?  Ask your health care provider about changing or stopping your regular medicines. This is especially important if you are taking diabetes medicines or blood thinners.  Remove your jewelry on the day of the procedure. What happens during the procedure?  An IV tube will be inserted into one of your veins.  Your health care provider will inject a small amount of radioactive tracer through the tube.  You will wait for 20-40 minutes while the tracer travels through your bloodstream.  Your heart activity will be monitored with an electrocardiogram (ECG).  You will lie down on an exam table.  Images of your heart will be taken for about 15-20 minutes.  You may be asked to exercise on a treadmill or stationary bike. While you exercise, your heart's activity will be  monitored with an ECG, and your blood pressure will be checked. If you are unable to exercise, you may be given a medicine to increase blood flow to parts of your heart.  When blood flow to your heart has peaked, a tracer will again be injected through the IV tube.  After 20-40 minutes, you will get back on the exam table and have more images taken of your heart.  When the procedure is over, your IV tube will be removed. The procedure may vary among health care providers and hospitals. Depending on the type of tracer used, scans may need to be repeated 3-4 hours later. What happens after the procedure?  Unless your health care provider tells you otherwise, you may return to your normal schedule, including diet, activities, and medicines.  Unless your health care provider tells you otherwise, you may increase your fluid intake. This will help flush the contrast dye from your body. Drink enough fluid to keep your urine clear or pale yellow.  It is up to you to get your test results. Ask your health care provider, or the department that is doing the test, when  your results will be ready. Summary  A cardiac nuclear scan measures the blood flow to the heart when a person is resting and when he or she is exercising.  You may need this test if you are at risk for heart disease.  Tell your health care provider if you are pregnant.  Unless your health care provider tells you otherwise, increase your fluid intake. This will help flush the contrast dye from your body. Drink enough fluid to keep your urine clear or pale yellow. This information is not intended to replace advice given to you by your health care provider. Make sure you discuss any questions you have with your health care provider. Document Released: 11/05/2004 Document Revised: 10/13/2016 Document Reviewed: 09/19/2013 Elsevier Interactive Patient Education  2017 [employer].

## 2018-08-18 ENCOUNTER — Encounter
Admission: RE | Admit: 2018-08-18 | Discharge: 2018-08-18 | Disposition: A | Discharge status: Home / Self Care | Payer: Medicare Other | Source: Ambulatory Visit | Attending: Nurse Practitioner | Admitting: Nurse Practitioner

## 2018-08-18 DIAGNOSIS — R072 Precordial pain: Secondary | ICD-10-CM | POA: Insufficient documentation

## 2018-08-18 LAB — NM MYOCAR MULTI W/SPECT W/WALL MOTION / EF
CSEPEDS: 42 s
CSEPHR: 88 %
Estimated workload: 7 METS
Exercise duration (min): 6 min
LV sys vol: 12 mL
LVDIAVOL: 44 mL (ref 46–106)
MPHR: 153 {beats}/min
Peak HR: 136 {beats}/min
Rest HR: 67 {beats}/min
SDS: 0
SRS: 0
SSS: 0
TID: 0.84

## 2018-08-18 MED ORDER — TECHNETIUM TC 99M TETROFOSMIN IV KIT
10.8400 | PACK | Freq: Once | INTRAVENOUS | Status: AC | PRN
  Administered 2018-08-18: 10.84 via INTRAVENOUS

## 2018-08-18 MED ORDER — TECHNETIUM TC 99M TETROFOSMIN IV KIT
30.3490 | PACK | Freq: Once | INTRAVENOUS | Status: AC | PRN
  Administered 2018-08-18: 30.349 via INTRAVENOUS

## 2018-09-02 ENCOUNTER — Encounter: Payer: Self-pay | Admitting: Family Medicine

## 2018-09-04 ENCOUNTER — Other Ambulatory Visit: Payer: Self-pay | Admitting: Family Medicine

## 2018-09-04 MED ORDER — METFORMIN HCL ER 500 MG PO TB24: 500.0000 mg | ORAL_TABLET | Freq: Every day | ORAL | 0 refills | Status: DC

## 2018-09-04 NOTE — Telephone Encounter (Signed)
Copied from [place] [id number]. Topic: Quick Communication - See Telephone Encounter >> Sep 04, 2018  8:10 AM Conception Chancy, NT wrote: CRM for notification. See Telephone encounter for: 09/04/18.  Patient is calling and states [facility] lost her prescription for metFORMIN (GLUCOPHAGE-XR) 500 MG 24 hr tablet  and they told her to go to a local pharmacy and request a emergency refill. She states she needs 1 week worth of medication. She is supposed to get her prescription from [place] on 09/12/18.  [id number] - [place], [place] - [address] [place] [address] Phone: [phone] Fax: [phone]

## 2018-09-05 ENCOUNTER — Ambulatory Visit: Payer: Medicare Other | Admitting: Family Medicine

## 2018-09-05 ENCOUNTER — Encounter: Payer: Self-pay | Admitting: Family Medicine

## 2018-09-05 VITALS — BP 110/72 | HR 77 | Temp 98.5°F | Ht 68.0 in | Wt 176.8 lb

## 2018-09-05 DIAGNOSIS — I700 Atherosclerosis of aorta: Secondary | ICD-10-CM | POA: Diagnosis not present

## 2018-09-05 DIAGNOSIS — E1159 Type 2 diabetes mellitus with other circulatory complications: Secondary | ICD-10-CM

## 2018-09-05 DIAGNOSIS — I10 Essential (primary) hypertension: Secondary | ICD-10-CM

## 2018-09-05 DIAGNOSIS — E782 Mixed hyperlipidemia: Secondary | ICD-10-CM

## 2018-09-05 MED ORDER — FAMOTIDINE 20 MG PO TABS: 20.0000 mg | ORAL_TABLET | Freq: Every day | ORAL | 3 refills | Status: DC

## 2018-09-05 NOTE — Assessment & Plan Note (Signed)
Check lipids today; goal LDL less than 70; healthy diet discussed

## 2018-09-05 NOTE — Patient Instructions (Signed)
Try to follow the DASH guidelines (DASH stands for Dietary Approaches to Stop Hypertension). Try to limit the sodium in your diet to no more than 1,500mg of sodium per day. Certainly try to not exceed 2,000 mg per day at the very most. Do not add salt when cooking or at the table.  Check the sodium amount on labels when shopping, and choose items lower in sodium when given a choice. Avoid or limit foods that already contain a lot of sodium. Eat a diet rich in fruits and vegetables and whole grains, and try to lose weight if overweight or obese Try to limit saturated fats in your diet (bologna, hot dogs, barbeque, cheeseburgers, hamburgers, steak, bacon, sausage, cheese, etc.) and get more fresh fruits, vegetables, and whole grains  

## 2018-09-05 NOTE — Assessment & Plan Note (Signed)
Goal LDL less than 70 

## 2018-09-05 NOTE — Progress Notes (Signed)
BP 110/72   Pulse 77   Temp 98.5 F (36.9 C) (Oral)   Ht 5\' 8"  (1.727 m)   Wt 176 lb 12.8 oz (80.2 kg)   SpO2 96%   BMI 26.88 kg/m    Subjective:    Patient ID: Felicia [facility], female    DOB: 1951/08/11, 67 y.o.   MRN: [id number]  HPI: Felicia [facility] is a 67 y.o. female  Chief Complaint  Patient presents with  . Follow-up    labs    HPI Patient is here to follow-up  Type 2 diabetes mellitus; taking metformin; no problems with feet except for some aching in toes; limited sweets No dry mouth or blurred vision; eye exam UTD; she is expecting metformin from mail order and got a short supply sent to [place] just yesterday  Lab Results  Component Value Date   HGBA1C 5.8 (H) 09/05/2018    Hypertension; controlled today on lisinopril; tries to limit salt; knows how to read ingredient/nutrition labels  Aortic athero; might have light mayo, pimento cheese, white wheat bread, baked scoops with salsa, pickle  Lab Results  Component Value Date   CHOL 154 09/05/2018   HDL 51 09/05/2018   LDLCALC 77 09/05/2018   TRIG 156 (H) 09/05/2018   CHOLHDL 3.0 09/05/2018   Was seen by cardiologist and they checked her out; she is in a study, had chest pain; reviewed notes; negative trop I x 2 Exercise Myoview; reviewed result note from RN, very good, EF 83% Continue aspirin and statin Going to exercise class twice a week  She was taking ranitidine; she bought something else; now taking famotidine  Taking lots of supplements; got a flyer in the mail about other supplement sales; I explained about Consumer Reports or other web site about education about supplements; she takes Neuriva for brain; taking collagen for her skin  Depression screen [facility] 2/9 09/05/2018 06/29/2018 03/01/2018 06/23/2017 06/09/2017  Decreased Interest 0 0 0 0 0  Down, Depressed, Hopeless 0 0 0 0 0  PHQ - 2 Score 0 0 0 0 0  Altered sleeping 0 0 - - -  Tired, decreased energy 0 0 - - -  Change in appetite 0 0 - - -    Feeling bad or failure about yourself  0 0 - - -  Trouble concentrating 0 0 - - -  Moving slowly or fidgety/restless 0 0 - - -  Suicidal thoughts 0 0 - - -  PHQ-9 Score 0 0 - - -  Difficult doing work/chores Not difficult at all Not difficult at all - - -   Fall Risk  09/05/2018 06/29/2018 03/01/2018 06/23/2017 06/09/2017  Falls in the past year? 0 Yes No No No  Comment - - - - -  Number falls in past yr: 0 1 - - -  Injury with Fall? - No - - -  Risk for fall due to : - Impaired vision - - -  Risk for fall due to: Comment - wears eyeglasses - - -  Follow up - Falls evaluation completed;Education provided;Falls prevention discussed - - -    Relevant past medical, surgical, family and social history reviewed Past Medical History:  Diagnosis Date  . Aortic atherosclerosis ([place])    a. 11/2017 noted on Chest CT - Aortic and branch vessel atherosclerosis.  . Atrophic vaginitis   . Chest pain    a. 06/2013 ETT: Ex time 6:00, Max HR 136 (87%), 7.1 METS. No ECG changes;  b. 07/2018 MV: Ex time: 6:42, nl EF, No ischemia.  Marland Kitchen COPD (chronic obstructive pulmonary disease) ([place])    a. 11/2017 Chest CT: moderate centrilobular emphysema.  . Diabetes mellitus without complication ([place])   . Diverticulitis   . DNAR (do not attempt resuscitation) 06/09/2017   Discussed today, 06/09/2017  . Essential hypertension   . GERD (gastroesophageal reflux disease)   . History of kidney stones   . History of tobacco use   . Hyperlipidemia   . IFG (impaired fasting glucose)   . Irregular heart beat 2018  . Leukorrhea, not specified as infective   . Osteopenia 05/27/2016   August 2017  . Personal history of tobacco use, presenting hazards to health 08/20/2015  . Pulmonary nodules    a. 11/2017 CT Chest: No change in bilat pulm nodules, including an ant LUL nodule - 5.3mm.   Past Surgical History:  Procedure Laterality Date  . colonoscopy    . COLONOSCOPY WITH PROPOFOL N/A 02/20/2018   Procedure: COLONOSCOPY WITH  PROPOFOL;  Surgeon: Lucilla Lame, MD;  Location: [facility];  Service: Endoscopy;  Laterality: N/A;  Diabetic  . CYSTOSCOPY WITH BIOPSY N/A 01/17/2017   Procedure: CYSTOSCOPY WITH BIOPSY;  Surgeon: Hollice Espy, MD;  Location: [facility];  Service: Urology;  Laterality: N/A;  . POLYPECTOMY  02/20/2018   Procedure: POLYPECTOMY;  Surgeon: Lucilla Lame, MD;  Location: [facility];  Service: Endoscopy;;  . TMJ ARTHROPLASTY  1984   Family History  Problem Relation Age of Onset  . Stroke Mother   . Hypertension Mother   . Hyperlipidemia Mother   . Diabetes Mother        pre-diabetic  . Diabetes Brother   . Irregular heart beat Brother   . Thyroid disease Sister   . Stroke Maternal Grandmother   . Cancer Maternal Grandfather        bone  . Cancer Sister        cystic fibroid carcinoma  . Ulcerative colitis Sister   . Breast cancer Neg Hx   . Hematuria Neg Hx   . Prostate cancer Neg Hx   . Renal cancer Neg Hx    Social History   Tobacco Use  . Smoking status: Former Smoker    Packs/day: 3.00    Years: 45.00    Pack years: 135.00    Types: Cigarettes    Last attempt to quit: 10/25/2009    Years since quitting: 8.9  . Smokeless tobacco: Never Used  . Tobacco comment: smoking cessation materials not required  Substance Use Topics  . Alcohol use: Yes    Alcohol/week: 0.0 standard drinks    Comment: Rarely  . Drug use: No     Office Visit from 09/05/2018 in [facility]  AUDIT-C Score  0      Interim medical history since last visit reviewed. Allergies and medications reviewed  Review of Systems Per HPI unless specifically indicated above     Objective:    BP 110/72   Pulse 77   Temp 98.5 F (36.9 C) (Oral)   Ht 5\' 8"  (1.727 m)   Wt 176 lb 12.8 oz (80.2 kg)   SpO2 96%   BMI 26.88 kg/m   Wt Readings from Last 3 Encounters:  09/15/18 177 lb 12 oz (80.6 kg)  09/05/18 176 lb 12.8 oz (80.2 kg)  08/15/18 179 lb (81.2 kg)      Physical Exam  Constitutional: She appears well-developed and well-nourished. No distress.  HENT:  Head: Normocephalic and atraumatic.  Eyes: EOM are normal. No scleral icterus.  Neck: No thyromegaly present.  Cardiovascular: Normal rate, regular rhythm and normal heart sounds.  No murmur heard. Pulmonary/Chest: Effort normal and breath sounds normal. No respiratory distress. She has no wheezes.  Abdominal: Soft. Bowel sounds are normal. She exhibits no distension.  Musculoskeletal: She exhibits no edema.  Neurological: She is alert.  Skin: Skin is warm and dry. She is not diaphoretic. No pallor.  Psychiatric: She has a normal mood and affect. Her behavior is normal. Judgment and thought content normal.   Diabetic Foot Form - Detailed   Diabetic Foot Exam - detailed Diabetic Foot exam was performed with the following findings:  Yes 09/05/2018 11:19 AM  Visual Foot Exam completed.:  Yes  Pulse Foot Exam completed.:  Yes  Right Dorsalis Pedis:  Present Left Dorsalis Pedis:  Present  Sensory Foot Exam Completed.:  Yes Semmes-Weinstein Monofilament Test R Site 1-Great Toe:  Pos L Site 1-Great Toe:  Pos          Assessment & Plan:   Problem List Items Addressed This Visit      Cardiovascular and Mediastinum   Essential hypertension    Controlled today      Aortic atherosclerosis (HCC)    Goal LDL less than 70      Relevant Orders   Lipid panel (Completed)     Endocrine   Type 2 diabetes mellitus, controlled ([place]) - Primary    Foot exam by MD today; check A1c; healthy eating encouraged      Relevant Orders   Hemoglobin A1C (Completed)   Urine Microalbumin w/creat. ratio     Other   Hyperlipidemia    Check lipids today; goal LDL less than 70; healthy diet discussed      Relevant Orders   Lipid panel (Completed)       Follow up plan: Return in about 6 months (around 03/06/2019) for follow-up visit with Dr. [name]; Medicare visit when due.  An after-visit  summary was printed and given to the patient at [facility].  Please see the patient instructions which may contain other information and recommendations beyond what is mentioned above in the assessment and plan.  Meds ordered this encounter  Medications  . famotidine (PEPCID) 20 MG tablet    Sig: Take 1 tablet (20 mg total) by mouth daily.    Dispense:  90 tablet    Refill:  3    May leave on file until patient needs this    Orders Placed This Encounter  Procedures  . Hemoglobin A1C  . Urine Microalbumin w/creat. ratio  . Lipid panel

## 2018-09-05 NOTE — Assessment & Plan Note (Signed)
Foot exam by MD today; check A1c; healthy eating encouraged

## 2018-09-05 NOTE — Assessment & Plan Note (Signed)
Controlled today 

## 2018-09-06 ENCOUNTER — Other Ambulatory Visit: Payer: Self-pay | Admitting: Family Medicine

## 2018-09-06 DIAGNOSIS — D2262 Melanocytic nevi of left upper limb, including shoulder: Secondary | ICD-10-CM | POA: Diagnosis not present

## 2018-09-06 DIAGNOSIS — E782 Mixed hyperlipidemia: Secondary | ICD-10-CM | POA: Diagnosis not present

## 2018-09-06 DIAGNOSIS — E1159 Type 2 diabetes mellitus with other circulatory complications: Secondary | ICD-10-CM | POA: Diagnosis not present

## 2018-09-06 DIAGNOSIS — D2261 Melanocytic nevi of right upper limb, including shoulder: Secondary | ICD-10-CM | POA: Diagnosis not present

## 2018-09-06 DIAGNOSIS — D225 Melanocytic nevi of trunk: Secondary | ICD-10-CM | POA: Diagnosis not present

## 2018-09-06 DIAGNOSIS — I700 Atherosclerosis of aorta: Secondary | ICD-10-CM | POA: Diagnosis not present

## 2018-09-06 DIAGNOSIS — D2271 Melanocytic nevi of right lower limb, including hip: Secondary | ICD-10-CM | POA: Diagnosis not present

## 2018-09-06 DIAGNOSIS — D2272 Melanocytic nevi of left lower limb, including hip: Secondary | ICD-10-CM | POA: Diagnosis not present

## 2018-09-06 LAB — LIPID PANEL
CHOL/HDL RATIO: 3 (calc) (ref <5.0)
Cholesterol: 154 mg/dL (ref <200)
HDL: 51 mg/dL (ref >50)
LDL Cholesterol (Calc): 77 mg/dL (calc)
NON-HDL CHOLESTEROL (CALC): 103 mg/dL (ref <130)
TRIGLYCERIDES: 156 mg/dL — AB (ref <150)

## 2018-09-06 LAB — HEMOGLOBIN A1C
Hgb A1c MFr Bld: 5.8 % of total Hgb — ABNORMAL HIGH (ref <5.7)
Mean Plasma Glucose: 120 (calc)
eAG (mmol/L): 6.6 (calc)

## 2018-09-06 MED ORDER — SIMVASTATIN 40 MG PO TABS: 40.0000 mg | ORAL_TABLET | Freq: Every day | ORAL | 3 refills | Status: DC

## 2018-09-06 NOTE — Progress Notes (Signed)
Increase statin

## 2018-09-07 ENCOUNTER — Encounter

## 2018-09-07 ENCOUNTER — Ambulatory Visit: Payer: Medicare Other | Admitting: Nurse Practitioner

## 2018-09-07 ENCOUNTER — Encounter: Payer: Self-pay | Admitting: Family Medicine

## 2018-09-07 LAB — MICROALBUMIN / CREATININE URINE RATIO
Creatinine, Urine: 91 mg/dL (ref 20–275)
Microalb Creat Ratio: 3 mcg/mg creat (ref <30)
Microalb, Ur: 0.3 mg/dL

## 2018-09-07 MED ORDER — SIMVASTATIN 40 MG PO TABS: 40.0000 mg | ORAL_TABLET | Freq: Every day | ORAL | 3 refills | Status: DC

## 2018-09-07 NOTE — Addendum Note (Signed)
Addended by: Sebastyan Snodgrass, Satira Anis on: 09/07/2018 04:18 PM   Modules accepted: Orders

## 2018-09-09 ENCOUNTER — Encounter: Payer: Self-pay | Admitting: Family Medicine

## 2018-09-11 NOTE — Telephone Encounter (Signed)
Please make sure this matches with her med list and update as needed Thank you

## 2018-09-15 ENCOUNTER — Ambulatory Visit: Payer: Medicare Other | Admitting: Nurse Practitioner

## 2018-09-15 ENCOUNTER — Encounter: Payer: Self-pay | Admitting: Nurse Practitioner

## 2018-09-15 VITALS — BP 118/76 | HR 77 | Wt 177.8 lb

## 2018-09-15 DIAGNOSIS — R0789 Other chest pain: Secondary | ICD-10-CM

## 2018-09-15 DIAGNOSIS — I10 Essential (primary) hypertension: Secondary | ICD-10-CM | POA: Diagnosis not present

## 2018-09-15 DIAGNOSIS — E782 Mixed hyperlipidemia: Secondary | ICD-10-CM | POA: Diagnosis not present

## 2018-09-15 NOTE — Progress Notes (Signed)
Office Visit    Patient Name: Felicia Cisneros Date of Encounter: 09/15/2018  Primary Care Provider:  Arnetha Courser, MD Primary Cardiologist:  Ida Rogue, MD  Chief Complaint    Felicia Cisneros is a 67 year old female with past history of hypertension, hyperlipidemia, pulmonary nodules, remote tobacco abuse, diabetes, GERD, and COPD. Seen today for follow up of recent episodes of chest pain and Myoview results.   Past Medical History    Past Medical History:  Diagnosis Date  . Aortic atherosclerosis ([place])    a. 11/2017 noted on Chest CT - Aortic and branch vessel atherosclerosis.  . Atrophic vaginitis   . Chest pain    a. 06/2013 ETT: Ex time 6:00, Max HR 136 (87%), 7.1 METS. No ECG changes; b. 07/2018 MV: Ex time: 6:42, nl EF, No ischemia.  Marland Kitchen COPD (chronic obstructive pulmonary disease) ([place])    a. 11/2017 Chest CT: moderate centrilobular emphysema.  . Diabetes mellitus without complication ([place])   . Diverticulitis   . DNAR (do not attempt resuscitation) 06/09/2017   Discussed today, 06/09/2017  . Essential hypertension   . GERD (gastroesophageal reflux disease)   . History of kidney stones   . History of tobacco use   . Hyperlipidemia   . IFG (impaired fasting glucose)   . Irregular heart beat 2018  . Leukorrhea, not specified as infective   . Osteopenia 05/27/2016   August 2017  . Personal history of tobacco use, presenting hazards to health 08/20/2015  . Pulmonary nodules    a. 11/2017 CT Chest: No change in bilat pulm nodules, including an ant LUL nodule - 5.31mm.   Past Surgical History:  Procedure Laterality Date  . colonoscopy    . COLONOSCOPY WITH PROPOFOL N/A 02/20/2018   Procedure: COLONOSCOPY WITH PROPOFOL;  Surgeon: Lucilla Lame, MD;  Location: [facility];  Service: Endoscopy;  Laterality: N/A;  Diabetic  . CYSTOSCOPY WITH BIOPSY N/A 01/17/2017   Procedure: CYSTOSCOPY WITH BIOPSY;  Surgeon: Hollice Espy, MD;  Location: [facility];  Service: Urology;   Laterality: N/A;  . POLYPECTOMY  02/20/2018   Procedure: POLYPECTOMY;  Surgeon: Lucilla Lame, MD;  [facility];  Service: Endoscopy;;  . TMJ ARTHROPLASTY  1984    Allergies  No Known Allergies  History of Present Illness    Felicia Cisneros is a 67 year old female with past history of hypertension, hyperlipidemia, pulmonary nodules, remote tobacco abuse, diabetes, GERD, and COPD.  In the setting of pulmonary nodules, she is enrolled in a research study where she has annual chest CTs.  CTs have previously shown aortic and branch vessel atherosclerosis without any significant coronary calcifications. She was recently evlauated in our office for chest pain. In October she had two episodes of sharp substernal pain that radiated to her right jaw,  that lasted about 15 minutes each time. She was evaluated in the ED, EKG was wnl and troponin's negative x 2. CTA was negative for PE. She was discharged and instructed to follow up with a cardiologist. At the time of her last visit she had not had any more chest pain, however given her history of DM, HTN and HLD a nuclear stress test was ordered. This showed no signs of ischemia and hyperdynamic LV fxn.  Since then she has been doing well. She denies chest pain, palpitations, dyspnea, pnd, orthopnea, n, v, dizziness, syncope, edema, weight gain, or early satiety.  She has been able to participate in her normal daily  activities and exercises twice a week at her local [facility] center without complication. 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    Home Medications    Prior to Admission medications   Medication Sig Start Date End Date Taking? Authorizing Provider  acetaminophen (TYLENOL) 500 MG tablet Take 1,000 mg by mouth every 6 (six) hours as needed (for pain.).   Yes [provider]  aspirin EC 81 MG tablet Take 81 mg by mouth at  bedtime.   Yes [provider]  CINNAMON PO Take 1,000 mg by mouth daily.    Yes [provider]  Coenzyme Q10 (COQ10) 400 MG CAPS Take 100 mg by mouth daily.    Yes [provider]  Collagen 500 MG CAPS Take 1 capsule by mouth 2 (two) times daily.   Yes [provider]  famotidine (PEPCID) 20 MG tablet Take 1 tablet (20 mg total) by mouth daily. 09/05/18  Yes Lada, Satira Anis, MD  Lancets (ACCU-CHEK SOFT TOUCH) lancets Use as instructed Patient taking differently: 1 each by Other route 3 (three) times a week. Use as instructed 11/27/15  Yes Lada, Satira Anis, MD  lisinopril (PRINIVIL,ZESTRIL) 5 MG tablet TAKE 1 TABLET BY MOUTH  DAILY FOR BLOOD PRESSURE 04/16/18  Yes Lada, Satira Anis, MD  Melatonin 2.5 MG CHEW Chew 2.5 mg by mouth daily as needed.   Yes [provider]  metFORMIN (GLUCOPHAGE-XR) 500 MG 24 hr tablet Take 1 tablet (500 mg total) by mouth daily with breakfast. 09/04/18  Yes Lada, Satira Anis, MD  Multiple Vitamin (MULTIVITAMIN WITH MINERALS) TABS tablet Take 1 tablet by mouth daily. CENTRUM SILVER FOR ADULTS 50+   Yes [provider]  Multiple Vitamins-Minerals (CENTRUM SILVER 50+WOMEN) TABS Take 1 tablet by mouth daily.   Yes [provider]  Nutritional Supplements (NUTRITIONAL SUPPLEMENT PLUS PO) Take 1 tablet by mouth every evening.   Yes [provider]  ONETOUCH VERIO test strip CHECK FINGERSTICK BLOOD SUGARS ONCE A DAY Patient taking differently: CHECK FINGERSTICK BLOOD SUGARS THREE TIMES A WEEK 04/23/16  Yes Lada, Satira Anis, MD  PREVIDENT 5000 PLUS 1.1 % CREA dental cream Place 1 application onto teeth daily as needed.  10/13/16  Yes [provider]  Probiotic Product (TRUBIOTICS) CAPS Take 1 capsule by mouth daily.   Yes [provider]  simvastatin (ZOCOR) 40 MG tablet Take 1 tablet (40 mg total) by mouth at bedtime. Patient taking differently: Take 40 mg by mouth 2 (two) times daily.  09/07/18   Yes Lada, Satira Anis, MD    Review of Systems    She denies chest pain, palpitations, dyspnea, pnd, orthopnea, n, v, dizziness, syncope, edema, weight gain, or early satiety.  All other systems reviewed and are otherwise negative except as noted above.  Physical Exam    VS:  BP 118/76 (BP Location: Left Arm, Patient Position: Sitting, Cuff Size: Normal)   Pulse 77   Wt 177 lb 12 oz (80.6 kg)   BMI 27.03 kg/m  , BMI Body mass index is 27.03 kg/m. GEN: Well nourished, well developed, in no acute distress. HEENT: normal. Neck: Supple, no JVD, carotid bruits, or masses. Cardiac: RRR, no murmurs, rubs, or gallops. No clubbing, cyanosis, edema.  Radials/DP/PT 2+ and equal bilaterally.  Respiratory:  Respirations regular and unlabored, clear to auscultation bilaterally. GI: Soft, nontender, nondistended, BS + x 4. MS: no deformity or  atrophy. Skin: warm and dry, no rash. Neuro:  Strength and sensation are intact. Psych: Normal affect.  Accessory Clinical Findings    ECG personally reviewed by me today - no EKG performed today per patient request.  Assessment & Plan    1.  Atypical Chest Pain: Recent episodes of sharp chest pain. Cardiac work up by ED and Myoview both negative for ischemic changes. Last EKG was wnl. Has not had any recurrence of CP since last OV. 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 Given lack of symptoms and negative cardiac work up, no further testing needed at this time.  Will recommend routine follow up in a year.   2. HLD: Well controlled, on statin. Last LDL 77 in November. PCP increased Simvastatin from 20 to 40mg  daily. Some confusion reported by patient about dosage due to similarity in appearance of the 20 and 40 mg tablets. Offered to have her come back with her pill bottles so that we can try and differentiate between the 20's and 40's for her.  3. HTN: Stable, will continue  current ACEi.   4. DMII: Managed by PCP. Well controlled on Metformin, last A1c was 5.8 in November. Continue ACEi for renal protection.  Cont statin.  5. Disposition: Will follow up in one year or sooner if needed.    Murray Hodgkins, NP 09/15/2018, 4:57 PM

## 2018-09-15 NOTE — Patient Instructions (Signed)
Medication Instructions:  - Your physician recommends that you continue on your current medications as directed. Please refer to the Current Medication list given to you today.  If you need a refill on your cardiac medications before your next appointment, please call your pharmacy.   Lab work: - none ordered  If you have labs (blood work) drawn today and your tests are completely normal, you will receive your results only by: Marland Kitchen MyChart Message (if you have MyChart) OR . A paper copy in the mail If you have any lab test that is abnormal or we need to change your treatment, we will call you to review the results.  Testing/Procedures: - none ordered  Follow-Up: At [facility], you and your health needs are our priority.  As part of our continuing mission to provide you with exceptional heart care, we have created designated Provider Care Teams.  These Care Teams include your primary Cardiologist (physician) and Advanced Practice Providers (APPs -  Physician Assistants and Nurse Practitioners) who all work together to provide you with the care you need, when you need it. You will need a follow up appointment in 1 year.  Please call our office 2 months in advance to schedule this appointment.  You may see Ida Rogue, MD or one of the following Advanced Practice Providers on your designated Care Team:   Murray Hodgkins, NP Christell Faith, PA-C . Marrianne Mood, PA-C  Any Other Special Instructions Will Be Listed Below (If Applicable). - N/A

## 2018-10-11 ENCOUNTER — Encounter: Payer: Self-pay | Admitting: Family Medicine

## 2018-10-24 ENCOUNTER — Encounter: Payer: Self-pay | Admitting: Family Medicine

## 2018-10-25 ENCOUNTER — Encounter: Payer: Self-pay | Admitting: Family Medicine

## 2018-10-27 MED ORDER — COQ10 100 MG PO CAPS: ORAL_CAPSULE | ORAL | Status: DC

## 2018-11-07 ENCOUNTER — Other Ambulatory Visit: Payer: Self-pay

## 2018-11-07 DIAGNOSIS — E119 Type 2 diabetes mellitus without complications: Secondary | ICD-10-CM

## 2018-11-07 MED ORDER — GLUCOSE BLOOD VI STRP: ORAL_STRIP | 1 refills | Status: DC

## 2018-11-07 MED ORDER — ACCU-CHEK SOFT TOUCH LANCETS MISC: 1 refills | Status: DC

## 2018-11-14 ENCOUNTER — Encounter: Payer: Self-pay | Admitting: Family Medicine

## 2019-01-02 ENCOUNTER — Other Ambulatory Visit: Payer: Self-pay | Admitting: Family Medicine

## 2019-01-03 NOTE — Telephone Encounter (Signed)
Lab Results  Component Value Date   CREATININE 0.70 08/10/2018

## 2019-01-21 ENCOUNTER — Encounter: Payer: Self-pay | Admitting: Family Medicine

## 2019-01-23 ENCOUNTER — Encounter: Payer: Self-pay | Admitting: Family Medicine

## 2019-02-19 ENCOUNTER — Encounter: Payer: Self-pay | Admitting: Family Medicine

## 2019-02-26 ENCOUNTER — Encounter: Payer: Self-pay | Admitting: Family Medicine

## 2019-03-05 ENCOUNTER — Encounter: Payer: Self-pay | Admitting: Family Medicine

## 2019-03-06 ENCOUNTER — Encounter: Payer: Self-pay | Admitting: Nurse Practitioner

## 2019-03-06 ENCOUNTER — Ambulatory Visit (INDEPENDENT_AMBULATORY_CARE_PROVIDER_SITE_OTHER): Payer: Medicare Other | Admitting: Nurse Practitioner

## 2019-03-06 ENCOUNTER — Other Ambulatory Visit: Payer: Self-pay

## 2019-03-06 VITALS — BP 124/79 | HR 83 | Ht 68.0 in | Wt 174.0 lb

## 2019-03-06 DIAGNOSIS — J432 Centrilobular emphysema: Secondary | ICD-10-CM

## 2019-03-06 DIAGNOSIS — E782 Mixed hyperlipidemia: Secondary | ICD-10-CM | POA: Diagnosis not present

## 2019-03-06 DIAGNOSIS — I700 Atherosclerosis of aorta: Secondary | ICD-10-CM

## 2019-03-06 DIAGNOSIS — E119 Type 2 diabetes mellitus without complications: Secondary | ICD-10-CM

## 2019-03-06 DIAGNOSIS — L309 Dermatitis, unspecified: Secondary | ICD-10-CM

## 2019-03-06 DIAGNOSIS — K219 Gastro-esophageal reflux disease without esophagitis: Secondary | ICD-10-CM | POA: Diagnosis not present

## 2019-03-06 DIAGNOSIS — I10 Essential (primary) hypertension: Secondary | ICD-10-CM

## 2019-03-06 MED ORDER — SIMVASTATIN 40 MG PO TABS: 40.0000 mg | ORAL_TABLET | Freq: Every day | ORAL | 1 refills | Status: DC

## 2019-03-06 MED ORDER — METFORMIN HCL ER 500 MG PO TB24: 500.0000 mg | ORAL_TABLET | Freq: Every day | ORAL | 1 refills | Status: DC

## 2019-03-06 MED ORDER — FAMOTIDINE 20 MG PO TABS: 20.0000 mg | ORAL_TABLET | Freq: Every day | ORAL | 1 refills | Status: DC

## 2019-03-06 MED ORDER — LISINOPRIL 5 MG PO TABS: 5.0000 mg | ORAL_TABLET | Freq: Every day | ORAL | 1 refills | Status: DC

## 2019-03-06 NOTE — Progress Notes (Signed)
Virtual Visit via Video Note  I connected with Felicia Cisneros on 03/06/19 at 11:40 AM EDT by a video enabled telemedicine application and verified that I am speaking with the correct person using two identifiers.   Staff discussed the limitations of evaluation and management by telemedicine and the availability of in person appointments. The patient expressed understanding and agreed to proceed.  Patient location: work office  My location: work office Other people present: none  HPI  Vaginal irritation Patietnt endorses mild irritation in the skin around her bottom and vaginal area, It started a couple of weeks ago, thought it was related to not showering as much. States sometimes when she urinates it burns the skin a bit. States irritation has not worsened. Denies dysuria, vaginal discharge, blood in stools or urine, pain with bowel movements. States bought some zinc oxide yesterday that she plans to use.   Hypertension rx lisinopril 5mg  daily BP Readings from Last 3 Encounters:  03/06/19 124/79  09/15/18 118/76  09/05/18 110/72    Diabetes rx metformin 500mg  1 tablet daily  Fasting CBG today 113 states typically in the 90's.  Lab Results  Component Value Date   HGBA1C 5.8 (H) 09/05/2018     Emphysema Former smoker- quit 9 years ago, with 135 pack-year history.  Gets annual CT lung screening Denies shortness of breath or coughing.   Hyperlipidemia rx simvastatin 40mg , no missed doses.  Has at least 2-3 servings a day Lab Results  Component Value Date   CHOL 154 09/05/2018   HDL 51 09/05/2018   LDLCALC 77 09/05/2018   TRIG 156 (H) 09/05/2018   CHOLHDL 3.0 09/05/2018    GERD rx famotidine daily, states works well for her. Occasionally gets triggered reflux if she eats supper too late, but doesn't do that often.   PHQ2/9: Depression screen [facility] 2/9 03/06/2019 09/05/2018 06/29/2018 03/01/2018 06/23/2017  Decreased Interest 0 0 0 0 0  Down, Depressed, Hopeless 0 0 0 0 0   PHQ - 2 Score 0 0 0 0 0  Altered sleeping 0 0 0 - -  Tired, decreased energy 0 0 0 - -  Change in appetite 0 0 0 - -  Feeling bad or failure about yourself  0 0 0 - -  Trouble concentrating 0 0 0 - -  Moving slowly or fidgety/restless 0 0 0 - -  Suicidal thoughts 0 0 0 - -  PHQ-9 Score 0 0 0 - -  Difficult doing work/chores Not difficult at all Not difficult at all Not difficult at all - -     PHQ reviewed. Negative  Patient Active Problem List   Diagnosis Date Noted  . Benign neoplasm of sigmoid colon   . Lower extremity edema 01/31/2018  . Need for vaccination with 13-polyvalent pneumococcal conjugate vaccine 06/09/2017  . Centrilobular emphysema ([place]) 12/02/2016  . Pulmonary nodules/lesions, multiple 12/02/2016  . Aortic atherosclerosis ([place]) 07/08/2016  . Osteopenia 05/27/2016  . Abnormality of nail tissue 11/27/2015  . Herpes simplex antibody positive 08/22/2015  . Essential hypertension   . Type 2 diabetes mellitus, controlled ([place])   . Atrophic vaginitis   . History of tobacco use   . Abnormal EKG 07/13/2013  . Hyperlipidemia 07/13/2013    Past Medical History:  Diagnosis Date  . Aortic atherosclerosis ([place])    a. 11/2017 noted on Chest CT - Aortic and branch vessel atherosclerosis.  . Atrophic vaginitis   . Chest pain    a. 06/2013 ETT: Ex time 6:00,  Max HR 136 (87%), 7.1 METS. No ECG changes; b. 07/2018 MV: Ex time: 6:42, nl EF, No ischemia.  Marland Kitchen COPD (chronic obstructive pulmonary disease) ([place])    a. 11/2017 Chest CT: moderate centrilobular emphysema.  . Diabetes mellitus without complication ([place])   . Diverticulitis   . DNAR (do not attempt resuscitation) 06/09/2017   Discussed today, 06/09/2017  . Essential hypertension   . GERD (gastroesophageal reflux disease)   . History of kidney stones   . History of tobacco use   . Hyperlipidemia   . IFG (impaired fasting glucose)   . Irregular heart beat 2018  . Leukorrhea, not specified as infective   .  Osteopenia 05/27/2016   August 2017  . Personal history of tobacco use, presenting hazards to health 08/20/2015  . Pulmonary nodules    a. 11/2017 CT Chest: No change in bilat pulm nodules, including an ant LUL nodule - 5.69mm.    Past Surgical History:  Procedure Laterality Date  . colonoscopy    . COLONOSCOPY WITH PROPOFOL N/A 02/20/2018   Procedure: COLONOSCOPY WITH PROPOFOL;  Surgeon: Lucilla Lame, MD;  Location: [facility];  Service: Endoscopy;  Laterality: N/A;  Diabetic  . CYSTOSCOPY WITH BIOPSY N/A 01/17/2017   Procedure: CYSTOSCOPY WITH BIOPSY;  Surgeon: Hollice Espy, MD;  Location: [facility];  Service: Urology;  Laterality: N/A;  . POLYPECTOMY  02/20/2018   Procedure: POLYPECTOMY;  Surgeon: Lucilla Lame, MD;  Location: [facility];  Service: Endoscopy;;  . TMJ ARTHROPLASTY  1984    Social History   Tobacco Use  . Smoking status: Former Smoker    Packs/day: 3.00    Years: 45.00    Pack years: 135.00    Types: Cigarettes    Last attempt to quit: 10/25/2009    Years since quitting: 9.3  . Smokeless tobacco: Never Used  . Tobacco comment: smoking cessation materials not required  Substance Use Topics  . Alcohol use: Yes    Alcohol/week: 0.0 standard drinks    Comment: Rarely     Current Outpatient Medications:  .  acetaminophen (TYLENOL) 500 MG tablet, Take 1,000 mg by mouth every 6 (six) hours as needed (for pain.)., Disp: , Rfl:  .  aspirin EC 81 MG tablet, Take 81 mg by mouth at bedtime., Disp: , Rfl:  .  CINNAMON PO, Take 1,000 mg by mouth 2 (two) times daily., Disp: , Rfl:  .  Coenzyme Q10 (COQ10) 100 MG CAPS, One by mouth daily, Disp: , Rfl:  .  famotidine (PEPCID) 20 MG tablet, Take 1 tablet (20 mg total) by mouth daily., Disp: 90 tablet, Rfl: 3 .  lisinopril (PRINIVIL,ZESTRIL) 5 MG tablet, TAKE 1 TABLET BY MOUTH  DAILY FOR BLOOD PRESSURE, Disp: 90 tablet, Rfl: 3 .  Melatonin 2.5 MG CHEW, Chew 2.5 mg by mouth daily as needed., Disp: , Rfl:  .   metFORMIN (GLUCOPHAGE-XR) 500 MG 24 hr tablet, TAKE 1 TABLET BY MOUTH  DAILY WITH BREAKFAST, Disp: 90 tablet, Rfl: 1 .  Multiple Vitamin (MULTIVITAMIN WITH MINERALS) TABS tablet, Take 1 tablet by mouth daily. CENTRUM SILVER FOR ADULTS 50+, Disp: , Rfl:  .  Multiple Vitamins-Minerals (CENTRUM SILVER 50+WOMEN) TABS, Take 1 tablet by mouth daily., Disp: , Rfl:  .  Nutritional Supplements (NUTRITIONAL SUPPLEMENT PLUS PO), Take 1 tablet by mouth every evening., Disp: , Rfl:  .  PREVIDENT 5000 PLUS 1.1 % CREA dental cream, Place 1 application onto teeth daily as needed. , Disp: , Rfl:  .  Probiotic Product (TRUBIOTICS) CAPS, Take 1 capsule by mouth daily., Disp: , Rfl:  .  simvastatin (ZOCOR) 40 MG tablet, Take 1 tablet (40 mg total) by mouth at bedtime., Disp: , Rfl:  .  Collagen 500 MG CAPS, Take 1 capsule by mouth 2 (two) times daily., Disp: , Rfl:  .  glucose blood (ONETOUCH VERIO) test strip, CHECK FINGERSTICK BLOOD SUGARS THREE TIMES A WEEK, Disp: 100 each, Rfl: 1 .  Lancets (ACCU-CHEK SOFT TOUCH) lancets, Check fingerstick blood sugars three times a week; Dx: E11.9, LON:99 months, Disp: 100 each, Rfl: 1  No Known Allergies  ROS   No other specific complaints in a complete review of systems (except as listed in HPI above).  Objective  Vitals:   03/06/19 1127  BP: 124/79  Pulse: 83  Weight: 174 lb (78.9 kg)  Height: 5\' 8"  (1.727 m)     Body mass index is 26.46 kg/m.  Nursing Note and Vital Signs reviewed.  Physical Exam  Constitutional: Patient appears well-developed and well-nourished. No distress.  HENT: Head: Normocephalic and atraumatic. Cardiovascular: Normal rate Pulmonary/Chest: Effort normal  Neurological: alert and oriented, speech normal.  Skin: No rash noted. No erythema.  Psychiatric: Patient has a normal mood and affect. behavior is normal. Judgment and thought content normal.    Assessment & Plan  1. Essential hypertension stable - lisinopril (ZESTRIL) 5  MG tablet; Take 1 tablet (5 mg total) by mouth daily. for blood pressure  Dispense: 90 tablet; Refill: 1  2. Aortic atherosclerosis (HCC) stable - simvastatin (ZOCOR) 40 MG tablet; Take 1 tablet (40 mg total) by mouth at bedtime.  Dispense: 90 tablet; Refill: 1  3. Centrilobular emphysema ([place]) Asymptomatic   4. Mixed hyperlipidemia Goal for LDL under 70; recheck at followup  - simvastatin (ZOCOR) 40 MG tablet; Take 1 tablet (40 mg total) by mouth at bedtime.  Dispense: 90 tablet; Refill: 1  5. GERD without esophagitis stable - famotidine (PEPCID) 20 MG tablet; Take 1 tablet (20 mg total) by mouth daily.  Dispense: 90 tablet; Refill: 1  6. Controlled type 2 diabetes mellitus without complication, without long-term current use of insulin ([place]) Well controlled  - lisinopril (ZESTRIL) 5 MG tablet; Take 1 tablet (5 mg total) by mouth daily. for blood pressure  Dispense: 90 tablet; Refill: 1 - metFORMIN (GLUCOPHAGE-XR) 500 MG 24 hr tablet; Take 1 tablet (500 mg total) by mouth daily with breakfast.  Dispense: 90 tablet; Refill: 1  7. Vulvar dermatitis [place], tucks wipes, warm water soaks, keep area clean and dry, follow up if not improving in 1-2 weeks.     Follow Up Instructions:   6 months follow up  I discussed the assessment and treatment plan with the patient. The patient was provided an opportunity to ask questions and all were answered. The patient agreed with the plan and demonstrated an understanding of the instructions.   The patient was advised to call back or seek an in-person evaluation if the symptoms worsen or if the condition fails to improve as anticipated.  I provided 24 minutes of non-face-to-face time during this encounter.   Fredderick Severance, NP

## 2019-03-06 NOTE — Patient Instructions (Signed)
  HOME TREATMENT OPTIONS  ? Witch Hazel (Tucks) pads may help with inflammation and discomfort.  ? Soaking in lukewarm water with 4-5 tablespoons of baking soda, 1-3 times daily for 10 minutes may help irritation and discomfort.  ? Zinc oxide (Desitin) ointment or Petroleum Jelly (Vaseline) may be applied to the affected area.  _____ Avoid bath soaps, lotions, gels, oils, bath salts, bubble bath, etc. which contain perfumes. This includes products marked gentle or mild. Suggested soaps include: Dove for sensitive skin, Neutrogena Basis, Aveeno or Pears.  ? Avoid using soap directly on the vulvar skin or scrubbing with a rough cloth. Just use warm water and your hand.  ? Pat the affected area dry with a soft towel or use a hair dryer on the cool setting.  ? Use unscented toilet paper, and pat dry instead of wiping.  ? Avoid feminine sprays, perfumes, or wipes. Avoid using vaginal douches, deodorized pads or tampons.  ? Only use cotton pads, do not use nylon pads. Avoid wearing pads on a daily basis. Examples of cotton pads include Stayfree, Psychologist, occupational.  ? Avoid shaving or using hair removal products. You may trim the hair or consider laser hair removal as

## 2019-03-19 ENCOUNTER — Encounter: Payer: Self-pay | Admitting: Family Medicine

## 2019-03-20 ENCOUNTER — Encounter: Payer: Self-pay | Admitting: Family Medicine

## 2019-03-22 ENCOUNTER — Encounter: Payer: Self-pay | Admitting: Family Medicine

## 2019-03-22 ENCOUNTER — Ambulatory Visit (INDEPENDENT_AMBULATORY_CARE_PROVIDER_SITE_OTHER): Payer: Medicare Other | Admitting: Family Medicine

## 2019-03-22 ENCOUNTER — Other Ambulatory Visit: Payer: Self-pay

## 2019-03-22 VITALS — BP 124/78 | HR 100 | Temp 98.1°F | Resp 16 | Ht 68.0 in | Wt 172.3 lb

## 2019-03-22 DIAGNOSIS — B3731 Acute candidiasis of vulva and vagina: Secondary | ICD-10-CM

## 2019-03-22 DIAGNOSIS — B373 Candidiasis of vulva and vagina: Secondary | ICD-10-CM

## 2019-03-22 MED ORDER — FLUCONAZOLE 150 MG PO TABS: ORAL_TABLET | ORAL | 0 refills | Status: DC

## 2019-03-22 NOTE — Patient Instructions (Addendum)
HOLD your Simvastatin for 2 weeks starting today. Follow up if not improving in 7 days.

## 2019-03-22 NOTE — Progress Notes (Signed)
Name: Felicia Cisneros   MRN: [id number]    DOB: 09-20-1951   Date:03/22/2019       Progress Note  Subjective  Chief Complaint  Chief Complaint  Patient presents with  . vaginal irritation    has tried desitin and tucks. when area wet when urinating she burns    HPI  Pt presents with concern for irritation in her private area for about 2 weeks.  The area of concern is between her vaginal and her anus.  She notes burning when urine touches the area, but no dysuria.  No hematuria, abdominal pain, no changes in bowel movements.  No drug/med changes.  She notes that she has been doing her hair herself instead of going to the salon, so she questions if this additional chemical exposure while in the shower may have caused her to develop some irritation.  Has DM - BG's having been running between 97-130. No recent vaginal penetration.  She has tried desitin, preparation H, tucks.  Patient Active Problem List   Diagnosis Date Noted  . GERD without esophagitis 03/06/2019  . Benign neoplasm of sigmoid colon   . Lower extremity edema 01/31/2018  . Need for vaccination with 13-polyvalent pneumococcal conjugate vaccine 06/09/2017  . Centrilobular emphysema ([place]) 12/02/2016  . Pulmonary nodules/lesions, multiple 12/02/2016  . Aortic atherosclerosis ([place]) 07/08/2016  . Osteopenia 05/27/2016  . Abnormality of nail tissue 11/27/2015  . Herpes simplex antibody positive 08/22/2015  . Essential hypertension   . Type 2 diabetes mellitus, controlled ([place])   . Atrophic vaginitis   . History of tobacco use   . Abnormal EKG 07/13/2013  . Hyperlipidemia 07/13/2013    Social History   Tobacco Use  . Smoking status: Former Smoker    Packs/day: 3.00    Years: 45.00    Pack years: 135.00    Types: Cigarettes    Last attempt to quit: 10/25/2009    Years since quitting: 9.4  . Smokeless tobacco: Never Used  . Tobacco comment: smoking cessation materials not required  Substance Use Topics  .  Alcohol use: Yes    Alcohol/week: 0.0 standard drinks    Comment: Rarely     Current Outpatient Medications:  .  acetaminophen (TYLENOL) 500 MG tablet, Take 1,000 mg by mouth every 6 (six) hours as needed (for pain.)., Disp: , Rfl:  .  aspirin EC 81 MG tablet, Take 81 mg by mouth at bedtime., Disp: , Rfl:  .  CINNAMON PO, Take 1,000 mg by mouth 2 (two) times daily., Disp: , Rfl:  .  Coenzyme Q10 (COQ10) 100 MG CAPS, One by mouth daily, Disp: , Rfl:  .  famotidine (PEPCID) 20 MG tablet, Take 1 tablet (20 mg total) by mouth daily., Disp: 90 tablet, Rfl: 1 .  glucose blood (ONETOUCH VERIO) test strip, CHECK FINGERSTICK BLOOD SUGARS THREE TIMES A WEEK, Disp: 100 each, Rfl: 1 .  Lancets (ACCU-CHEK SOFT TOUCH) lancets, Check fingerstick blood sugars three times a week; Dx: E11.9, LON:99 months, Disp: 100 each, Rfl: 1 .  lisinopril (ZESTRIL) 5 MG tablet, Take 1 tablet (5 mg total) by mouth daily. for blood pressure, Disp: 90 tablet, Rfl: 1 .  Melatonin 2.5 MG CHEW, Chew 2.5 mg by mouth daily as needed., Disp: , Rfl:  .  metFORMIN (GLUCOPHAGE-XR) 500 MG 24 hr tablet, Take 1 tablet (500 mg total) by mouth daily with breakfast., Disp: 90 tablet, Rfl: 1 .  Multiple Vitamins-Minerals (CENTRUM SILVER 50+WOMEN) TABS, Take 1 tablet by  mouth daily., Disp: , Rfl:  .  Nutritional Supplements (NUTRITIONAL SUPPLEMENT PLUS PO), Take 1 tablet by mouth every evening., Disp: , Rfl:  .  PREVIDENT 5000 PLUS 1.1 % CREA dental cream, Place 1 application onto teeth daily as needed. , Disp: , Rfl:  .  Probiotic Product (TRUBIOTICS) CAPS, Take 1 capsule by mouth daily., Disp: , Rfl:  .  simvastatin (ZOCOR) 40 MG tablet, Take 1 tablet (40 mg total) by mouth at bedtime., Disp: 90 tablet, Rfl: 1 .  Collagen 500 MG CAPS, Take 1 capsule by mouth 2 (two) times daily., Disp: , Rfl:  .  Multiple Vitamin (MULTIVITAMIN WITH MINERALS) TABS tablet, Take 1 tablet by mouth daily. CENTRUM SILVER FOR ADULTS 50+, Disp: , Rfl:   No Known  Allergies  I personally reviewed active problem list, medication list, allergies, lab results with the patient/caregiver today.  ROS  Ten systems reviewed and is negative except as mentioned in HPI.  Objective  Vitals:   03/22/19 0928  BP: 124/78  Pulse: 100  Resp: 16  Temp: 98.1 F (36.7 C)  TempSrc: Oral  SpO2: 97%  Weight: 172 lb 4.8 oz (78.2 kg)  Height: 5\' 8"  (1.727 m)   Body mass index is 26.2 kg/m.  Nursing Note and Vital Signs reviewed.  Physical Exam  Constitutional: Patient appears well-developed and well-nourished. No distress.  HENT: Head: Normocephalic and atraumatic. Eyes: Conjunctivae and EOM are normal.  Neck: Normal range of motion. Neck supple. No JVD present.  Cardiovascular: Normal rate, regular rhythm and normal heart sounds.  No murmur heard. No BLE edema. Pulmonary/Chest: Effort normal and breath sounds normal. No respiratory distress. Abdominal: Soft. Bowel sounds are normal, no distension. There is no tenderness. no masses FEMALE GENITALIA: The anterior perineum and posterior vaginal vault exhibit erythema, mild tenderness.  There is no other rash, no lesion. External urethra normal Rectal: No external hemorrhoids. Musculoskeletal: Normal range of motion, no joint effusions. No gross deformities Neurological: he is alert and oriented to person, place, and time. No cranial nerve deficit. Coordination, balance, strength, speech and gait are normal.  Skin: Skin is warm and dry. No rash noted. No erythema.  Psychiatric: Patient has a normal mood and affect. behavior is normal. Judgment and thought content normal.  No results found for this or any previous visit (from the past 72 hour(s)).  Assessment & Plan  1. Vaginal candidiasis - Suspect possible combination of candida and contact irritant dermatitis.  Advised to use a gentle soap externally only, no douching or internal soaps, no other products to be used on the area.  Treat for yeast, and if  not improving, she will let us know and we will consider referral to GYN vs re-evaluation for possible antibiotic need.  I did advise she HOLD her statin medication for 2 weeks due to taking diflucan.  - fluconazole (DIFLUCAN) 150 MG tablet; Take 1 tablet once.  May repeat in 72 hours if not improving.  Dispense: 2 tablet; Refill: 0  -Red flags and when to present for emergency care or RTC including fever >101.23F, chest pain, shortness of breath, new/worsening/un-resolving symptoms, reviewed with patient at time of visit. Follow up and care instructions discussed and provided in AVS.

## 2019-03-26 ENCOUNTER — Encounter: Payer: Self-pay | Admitting: Family Medicine

## 2019-03-27 ENCOUNTER — Encounter: Payer: Self-pay | Admitting: Family Medicine

## 2019-03-30 ENCOUNTER — Encounter: Payer: Self-pay | Admitting: Family Medicine

## 2019-04-09 ENCOUNTER — Encounter: Payer: Self-pay | Admitting: Family Medicine

## 2019-04-09 ENCOUNTER — Other Ambulatory Visit: Payer: Self-pay | Admitting: Family Medicine

## 2019-04-09 DIAGNOSIS — R21 Rash and other nonspecific skin eruption: Secondary | ICD-10-CM

## 2019-04-12 ENCOUNTER — Encounter: Payer: Self-pay | Admitting: Family Medicine

## 2019-04-20 ENCOUNTER — Encounter: Payer: Self-pay | Admitting: Family Medicine

## 2019-05-29 ENCOUNTER — Encounter: Payer: Self-pay | Admitting: Family Medicine

## 2019-05-31 DIAGNOSIS — H25013 Cortical age-related cataract, bilateral: Secondary | ICD-10-CM | POA: Diagnosis not present

## 2019-06-05 ENCOUNTER — Encounter: Payer: Self-pay | Admitting: Emergency Medicine

## 2019-06-05 ENCOUNTER — Other Ambulatory Visit: Payer: Self-pay

## 2019-06-05 ENCOUNTER — Emergency Department
Admission: EM | Admit: 2019-06-05 | Discharge: 2019-06-05 | Disposition: A | Discharge status: Home / Self Care | Payer: Medicare Other | Attending: Emergency Medicine | Admitting: Emergency Medicine

## 2019-06-05 ENCOUNTER — Emergency Department: Payer: Medicare Other

## 2019-06-05 DIAGNOSIS — R1084 Generalized abdominal pain: Secondary | ICD-10-CM

## 2019-06-05 DIAGNOSIS — Z7982 Long term (current) use of aspirin: Secondary | ICD-10-CM | POA: Insufficient documentation

## 2019-06-05 DIAGNOSIS — Z79899 Other long term (current) drug therapy: Secondary | ICD-10-CM | POA: Insufficient documentation

## 2019-06-05 DIAGNOSIS — F419 Anxiety disorder, unspecified: Secondary | ICD-10-CM | POA: Diagnosis not present

## 2019-06-05 DIAGNOSIS — Z87891 Personal history of nicotine dependence: Secondary | ICD-10-CM | POA: Diagnosis not present

## 2019-06-05 DIAGNOSIS — R079 Chest pain, unspecified: Secondary | ICD-10-CM | POA: Diagnosis not present

## 2019-06-05 DIAGNOSIS — I10 Essential (primary) hypertension: Secondary | ICD-10-CM | POA: Insufficient documentation

## 2019-06-05 DIAGNOSIS — E119 Type 2 diabetes mellitus without complications: Secondary | ICD-10-CM | POA: Insufficient documentation

## 2019-06-05 DIAGNOSIS — Z7984 Long term (current) use of oral hypoglycemic drugs: Secondary | ICD-10-CM | POA: Diagnosis not present

## 2019-06-05 DIAGNOSIS — R1030 Lower abdominal pain, unspecified: Secondary | ICD-10-CM | POA: Diagnosis not present

## 2019-06-05 DIAGNOSIS — R0789 Other chest pain: Secondary | ICD-10-CM

## 2019-06-05 LAB — CBC
HCT: 43.7 % (ref 36.0–46.0)
Hemoglobin: 14.7 g/dL (ref 12.0–15.0)
MCH: 30.7 pg (ref 26.0–34.0)
MCHC: 33.6 g/dL (ref 30.0–36.0)
MCV: 91.2 fL (ref 80.0–100.0)
Platelets: 226 10*3/uL (ref 150–400)
RBC: 4.79 MIL/uL (ref 3.87–5.11)
RDW: 12.8 % (ref 11.5–15.5)
WBC: 7.1 10*3/uL (ref 4.0–10.5)
nRBC: 0 % (ref 0.0–0.2)

## 2019-06-05 LAB — COMPREHENSIVE METABOLIC PANEL
ALT: 19 U/L (ref 0–44)
AST: 20 U/L (ref 15–41)
Albumin: 4.7 g/dL (ref 3.5–5.0)
Alkaline Phosphatase: 70 U/L (ref 38–126)
Anion gap: 8 (ref 5–15)
BUN: 19 mg/dL (ref 8–23)
CO2: 26 mmol/L (ref 22–32)
Calcium: 9.2 mg/dL (ref 8.9–10.3)
Chloride: 104 mmol/L (ref 98–111)
Creatinine, Ser: 0.7 mg/dL (ref 0.44–1.00)
GFR calc Af Amer: >60 mL/min (ref >60)
GFR calc non Af Amer: >60 mL/min (ref >60)
Glucose, Bld: 110 mg/dL — ABNORMAL HIGH (ref 70–99)
Potassium: 3.8 mmol/L (ref 3.5–5.1)
Sodium: 138 mmol/L (ref 135–145)
Total Bilirubin: 0.4 mg/dL (ref 0.3–1.2)
Total Protein: 7.2 g/dL (ref 6.5–8.1)

## 2019-06-05 LAB — URINALYSIS, COMPLETE (UACMP) WITH MICROSCOPIC
Bacteria, UA: NONE SEEN
Bilirubin Urine: NEGATIVE
Glucose, UA: NEGATIVE mg/dL
Ketones, ur: NEGATIVE mg/dL
Leukocytes,Ua: NEGATIVE
Nitrite: NEGATIVE
Protein, ur: NEGATIVE mg/dL
Specific Gravity, Urine: 1.008 (ref 1.005–1.030)
Squamous Epithelial / HPF: NONE SEEN (ref 0–5)
pH: 6 (ref 5.0–8.0)

## 2019-06-05 LAB — TROPONIN I (HIGH SENSITIVITY)
Troponin I (High Sensitivity): <2 ng/L (ref <18)
Troponin I (High Sensitivity): <2 ng/L (ref <18)

## 2019-06-05 LAB — LIPASE, BLOOD: Lipase: 45 U/L (ref 11–51)

## 2019-06-05 MED ORDER — SODIUM CHLORIDE 0.9% FLUSH: 3.0000 mL | Freq: Once | INTRAVENOUS | Status: DC

## 2019-06-05 NOTE — ED Triage Notes (Signed)
Pt presents to ED via [facility]. Per patient she has been having sharp intermittent abdominal pain x several days, pt denies N/V/D. Pt also c/o CP since this morning. Pt states R sided chest pain that radiates to her R arm and her jaw. Pt states CP has subsided at this time.

## 2019-06-05 NOTE — ED Provider Notes (Signed)
[facility] Emergency Department Provider Note   ____________________________________________   First MD Initiated Contact with Patient 06/05/19 1642     (approximate)  I have reviewed the triage vital signs and the nursing notes.   HISTORY  Chief Complaint Abdominal Pain and Chest Pain    HPI Felicia Cisneros is a 68 y.o. female with past medical history of hypertension, diabetes presents to the ED complaining of chest pain and abdominal pain.  Patient reports she has had intermittent diffuse abdominal pain over the past couple of days, but she became more concerned earlier this morning when she began to have dull pain in the right side of her chest.  She describes it as an ache that lasted for about 1 hour, was not associated with shortness of breath, nausea, or vomiting.  She denies any recent fevers or cough.  She does report having very similar symptoms in the past, had stress testing performed in October of last year that was unremarkable and she was told it was anxiety.  Symptoms have completely resolved following patient's arrival in the ED.  She denies any pain or swelling in her legs, has not had any recent surgery or chemotherapy.        Past Medical History:  Diagnosis Date  . Aortic atherosclerosis ([place])    a. 11/2017 noted on Chest CT - Aortic and branch vessel atherosclerosis.  . Atrophic vaginitis   . Chest pain    a. 06/2013 ETT: Ex time 6:00, Max HR 136 (87%), 7.1 METS. No ECG changes; b. 07/2018 MV: Ex time: 6:42, nl EF, No ischemia.  Marland Kitchen COPD (chronic obstructive pulmonary disease) ([place])    a. 11/2017 Chest CT: moderate centrilobular emphysema.  . Diabetes mellitus without complication ([place])   . Diverticulitis   . DNAR (do not attempt resuscitation) 06/09/2017   Discussed today, 06/09/2017  . Essential hypertension   . GERD (gastroesophageal reflux disease)   . History of kidney stones   . History of tobacco use   . Hyperlipidemia   .  IFG (impaired fasting glucose)   . Irregular heart beat 2018  . Leukorrhea, not specified as infective   . Osteopenia 05/27/2016   August 2017  . Personal history of tobacco use, presenting hazards to health 08/20/2015  . Pulmonary nodules    a. 11/2017 CT Chest: No change in bilat pulm nodules, including an ant LUL nodule - 5.54mm.    Patient Active Problem List   Diagnosis Date Noted  . GERD without esophagitis 03/06/2019  . Benign neoplasm of sigmoid colon   . Lower extremity edema 01/31/2018  . Need for vaccination with 13-polyvalent pneumococcal conjugate vaccine 06/09/2017  . Centrilobular emphysema ([place]) 12/02/2016  . Pulmonary nodules/lesions, multiple 12/02/2016  . Aortic atherosclerosis ([place]) 07/08/2016  . Osteopenia 05/27/2016  . Abnormality of nail tissue 11/27/2015  . Herpes simplex antibody positive 08/22/2015  . Essential hypertension   . Type 2 diabetes mellitus, controlled ([place])   . Atrophic vaginitis   . History of tobacco use   . Abnormal EKG 07/13/2013  . Hyperlipidemia 07/13/2013    Past Surgical History:  Procedure Laterality Date  . colonoscopy    . COLONOSCOPY WITH PROPOFOL N/A 02/20/2018   Procedure: COLONOSCOPY WITH PROPOFOL;  Surgeon: Lucilla Lame, MD;  Location: [facility];  Service: Endoscopy;  Laterality: N/A;  Diabetic  . CYSTOSCOPY WITH BIOPSY N/A 01/17/2017   Procedure: CYSTOSCOPY WITH BIOPSY;  Surgeon: Hollice Espy, MD;  Location: [facility];  Service: Urology;  Laterality: N/A;  . POLYPECTOMY  02/20/2018   Procedure: POLYPECTOMY;  Surgeon: Lucilla Lame, MD;  Location: [facility];  Service: Endoscopy;;  . TMJ ARTHROPLASTY  1984    Prior to Admission medications   Medication Sig Start Date End Date Taking? Authorizing Provider  acetaminophen (TYLENOL) 500 MG tablet Take 1,000 mg by mouth every 6 (six) hours as needed (for pain.).    [provider]  aspirin EC 81 MG tablet Take 81 mg by mouth at bedtime.    [provider]  CINNAMON PO Take 1,000 mg by mouth 2 (two) times daily.    [provider]  Coenzyme Q10 (COQ10) 100 MG CAPS One by mouth daily 10/27/18   Lada, Satira Anis, MD  famotidine (PEPCID) 20 MG tablet Take 1 tablet (20 mg total) by mouth daily. 03/06/19   Poulose, Bethel Born, NP  fluconazole (DIFLUCAN) 150 MG tablet Take 1 tablet once.  May repeat in 72 hours if not improving. 03/22/19   Hubbard Hartshorn, FNP  glucose blood (ONETOUCH VERIO) test strip CHECK FINGERSTICK BLOOD SUGARS THREE TIMES A WEEK 11/07/18   Lada, Satira Anis, MD  Lancets (ACCU-CHEK SOFT TOUCH) lancets Check fingerstick blood sugars three times a week; Dx: E11.9, LON:99 months 11/07/18   Lada, Satira Anis, MD  lisinopril (ZESTRIL) 5 MG tablet Take 1 tablet (5 mg total) by mouth daily. for blood pressure 03/06/19   Poulose, Bethel Born, NP  Melatonin 2.5 MG CHEW Chew 2.5 mg by mouth daily as needed.    [provider]  metFORMIN (GLUCOPHAGE-XR) 500 MG 24 hr tablet Take 1 tablet (500 mg total) by mouth daily with breakfast. 03/06/19   Poulose, Bethel Born, NP  Multiple Vitamin (MULTIVITAMIN WITH MINERALS) TABS tablet Take 1 tablet by mouth daily. CENTRUM SILVER FOR ADULTS 50+    [provider]  Multiple Vitamins-Minerals (CENTRUM SILVER 50+WOMEN) TABS Take 1 tablet by mouth daily.    [provider]  Nutritional Supplements (NUTRITIONAL SUPPLEMENT PLUS PO) Take 1 tablet by mouth every evening.    [provider]  PREVIDENT 5000 PLUS 1.1 % CREA dental cream Place 1 application onto teeth daily as needed.  10/13/16   [provider]  Probiotic Product (TRUBIOTICS) CAPS Take 1 capsule by mouth daily.    [provider]  simvastatin (ZOCOR) 40 MG tablet Take 1 tablet (40 mg total) by mouth at bedtime. 03/06/19   Poulose, Bethel Born, NP    Allergies Patient has no known allergies.  Family History  Problem Relation Age of Onset  . Stroke Mother   . Hypertension Mother   .  Hyperlipidemia Mother   . Diabetes Mother        pre-diabetic  . Diabetes Brother   . Irregular heart beat Brother   . Thyroid disease Sister   . Stroke Maternal Grandmother   . Cancer Maternal Grandfather        bone  . Cancer Sister        cystic fibroid carcinoma  . Ulcerative colitis Sister   . Breast cancer Neg Hx   . Hematuria Neg Hx   . Prostate cancer Neg Hx   . Renal cancer Neg Hx     Social History Social History   Tobacco Use  . Smoking status: Former Smoker    Packs/day: 3.00    Years: 45.00    Pack years: 135.00    Types: Cigarettes    Quit date: 10/25/2009  Years since quitting: 9.6  . Smokeless tobacco: Never Used  . Tobacco comment: smoking cessation materials not required  Substance Use Topics  . Alcohol use: Yes    Alcohol/week: 0.0 standard drinks    Comment: Rarely  . Drug use: No    Review of Systems  Constitutional: No fever/chills Eyes: No visual changes. ENT: No sore throat. Cardiovascular: Positive for chest pain. Respiratory: Denies shortness of breath. Gastrointestinal: Positive for abdominal pain.  No nausea, no vomiting.  No diarrhea.  No constipation. Genitourinary: Negative for dysuria. Musculoskeletal: Negative for back pain. Skin: Negative for rash. Neurological: Negative for headaches, focal weakness or numbness.  ____________________________________________   PHYSICAL EXAM:  VITAL SIGNS: ED Triage Vitals  Enc Vitals Group     BP 06/05/19 1334 121/78     Pulse Rate 06/05/19 1334 67     Resp 06/05/19 1334 18     Temp 06/05/19 1334 98.2 F (36.8 C)     Temp Source 06/05/19 1334 Oral     SpO2 06/05/19 1334 100 %     Weight 06/05/19 1331 168 lb (76.2 kg)     Height 06/05/19 1331 5\' 8"  (1.727 m)     Head Circumference --      Peak Flow --      Pain Score 06/05/19 1331 0     Pain Loc --      Pain Edu? --      Excl. in [place]? --     Constitutional: Alert and oriented. Eyes: Conjunctivae are normal. Head: Atraumatic.  Nose: No congestion/rhinnorhea. Mouth/Throat: Mucous membranes are moist. Neck: Normal ROM Cardiovascular: Normal rate, regular rhythm. Grossly normal heart sounds. Respiratory: Normal respiratory effort.  No retractions. Lungs CTAB. Gastrointestinal: Soft and nontender. No distention. Genitourinary: deferred Musculoskeletal: No lower extremity tenderness nor edema. Neurologic:  Normal speech and language. No gross focal neurologic deficits are appreciated. Skin:  Skin is warm, dry and intact. No rash noted. Psychiatric: Mood and affect are normal. Speech and behavior are normal.  ____________________________________________   LABS (all labs ordered are listed, but only abnormal results are displayed)  Labs Reviewed  COMPREHENSIVE METABOLIC PANEL - Abnormal; Notable for the following components:      Result Value   Glucose, Bld 110 (*)    All other components within normal limits  URINALYSIS, COMPLETE (UACMP) WITH MICROSCOPIC - Abnormal; Notable for the following components:   Color, Urine YELLOW (*)    APPearance CLEAR (*)    Hgb urine dipstick SMALL (*)    All other components within normal limits  LIPASE, BLOOD  CBC  TROPONIN I (HIGH SENSITIVITY)  TROPONIN I (HIGH SENSITIVITY)   ____________________________________________  EKG  ED ECG REPORT I, Blake Divine, the attending physician, personally viewed and interpreted this ECG.   Date: 06/05/2019  EKG Time: 13:33  Rate: 67  Rhythm: normal EKG, normal sinus rhythm, unchanged from previous tracings  Axis: Normal  Intervals:none  ST&T Change: None  ____________________________________________   PROCEDURES  Procedure(s) performed (including Critical Care):  Procedures   ____________________________________________   INITIAL IMPRESSION / ASSESSMENT AND PLAN / ED COURSE       68 year old female with history of hypertension diabetes presents to the ED with episode of dull right-sided chest pain occurring  after recent episodes of intermittent diffuse abdominal pain.  Differential includes ACS, PE, pneumonia, pneumothorax, musculoskeletal, reflux, anxiety.  EKG without acute ischemic changes and initial troponin within normal limits.  Low suspicion for ACS given history not concerning and patient  with heart score of 3.  Suspect there is other anxiety or reflux contributing to patient's symptoms.  We will plan on repeat troponin in 2 hours.  Do not suspect PE as she is low risk by [place] and asymptomatic at this time.  Chest x-ray negative for acute process.  Repeat troponin also within normal limits.  Suspect patient may have element of anxiety as well as potentially reflux contributing to her symptoms.  She did have stress testing negative for cardiac ischemia in October of last year.  She is appropriate for further management as an outpatient, counseled to follow-up with her PCP and return to the ED for new or worsening symptoms.  Patient agrees with plan.      ____________________________________________   FINAL CLINICAL IMPRESSION(S) / ED DIAGNOSES  Final diagnoses:  Atypical chest pain  Generalized abdominal pain  Anxiety     ED Discharge Orders    None       Note:  This document was prepared using Dragon voice recognition software and may include unintentional dictation errors.   Blake Divine, MD 06/05/19 2228

## 2019-06-22 ENCOUNTER — Encounter: Payer: Self-pay | Admitting: Family Medicine

## 2019-07-03 ENCOUNTER — Telehealth: Payer: Self-pay | Admitting: *Deleted

## 2019-07-03 ENCOUNTER — Encounter: Payer: Self-pay | Admitting: Family Medicine

## 2019-07-03 ENCOUNTER — Telehealth: Payer: Self-pay | Admitting: Obstetrics & Gynecology

## 2019-07-03 ENCOUNTER — Ambulatory Visit (INDEPENDENT_AMBULATORY_CARE_PROVIDER_SITE_OTHER): Payer: Medicare Other | Admitting: Family Medicine

## 2019-07-03 ENCOUNTER — Ambulatory Visit (INDEPENDENT_AMBULATORY_CARE_PROVIDER_SITE_OTHER): Payer: Medicare Other

## 2019-07-03 ENCOUNTER — Other Ambulatory Visit: Payer: Self-pay

## 2019-07-03 VITALS — BP 130/84 | HR 67 | Temp 97.6°F | Resp 14 | Ht 68.0 in | Wt 176.1 lb

## 2019-07-03 VITALS — BP 130/84 | HR 67 | Temp 97.6°F | Resp 16 | Ht 68.0 in | Wt 176.1 lb

## 2019-07-03 DIAGNOSIS — M810 Age-related osteoporosis without current pathological fracture: Secondary | ICD-10-CM | POA: Diagnosis not present

## 2019-07-03 DIAGNOSIS — Z1231 Encounter for screening mammogram for malignant neoplasm of breast: Secondary | ICD-10-CM

## 2019-07-03 DIAGNOSIS — M8580 Other specified disorders of bone density and structure, unspecified site: Secondary | ICD-10-CM

## 2019-07-03 DIAGNOSIS — J432 Centrilobular emphysema: Secondary | ICD-10-CM | POA: Diagnosis not present

## 2019-07-03 DIAGNOSIS — E782 Mixed hyperlipidemia: Secondary | ICD-10-CM | POA: Diagnosis not present

## 2019-07-03 DIAGNOSIS — I700 Atherosclerosis of aorta: Secondary | ICD-10-CM

## 2019-07-03 DIAGNOSIS — I10 Essential (primary) hypertension: Secondary | ICD-10-CM

## 2019-07-03 DIAGNOSIS — Z0000 Encounter for general adult medical examination without abnormal findings: Secondary | ICD-10-CM

## 2019-07-03 DIAGNOSIS — M85852 Other specified disorders of bone density and structure, left thigh: Secondary | ICD-10-CM

## 2019-07-03 DIAGNOSIS — N898 Other specified noninflammatory disorders of vagina: Secondary | ICD-10-CM

## 2019-07-03 DIAGNOSIS — E119 Type 2 diabetes mellitus without complications: Secondary | ICD-10-CM | POA: Diagnosis not present

## 2019-07-03 DIAGNOSIS — K219 Gastro-esophageal reflux disease without esophagitis: Secondary | ICD-10-CM

## 2019-07-03 MED ORDER — ONETOUCH VERIO VI STRP: ORAL_STRIP | 3 refills | Status: DC

## 2019-07-03 NOTE — Telephone Encounter (Signed)
Left message for patient to notify them that it is time to schedule annual low dose lung cancer screening CT scan. Instructed patient to call back to verify information prior to the scan being scheduled.  

## 2019-07-03 NOTE — Patient Instructions (Signed)
Felicia Cisneros , Thank you for taking time to come for your Medicare Wellness Visit. I appreciate your ongoing commitment to your health goals. Please review the following plan we discussed and let me know if I can assist you in the future.   Screening recommendations/referrals: Colonoscopy: done 02/20/18. Repeat in 10 years.  Mammogram: done 06/08/18. Please call [phone] to schedule your mammogram and bone density screening.  Bone Density: done 05/27/16 Recommended yearly ophthalmology/optometry visit for glaucoma screening and checkup Recommended yearly dental visit for hygiene and checkup  Vaccinations: Influenza vaccine: done 06/13/19  Pneumococcal vaccine: done 06/09/17 Tdap vaccine: done 2010 Shingles vaccine: Shingrix discussed. Please contact your pharmacy for coverage information.   Advanced directives: Please bring a copy of your health care power of attorney and living will to the office at your convenience.  Conditions/risks identified: Recommend increasing physical activity to at least 3 days per week.   Next appointment: Please follow up in one year for your Medicare Annual Wellness visit.     Preventive Care 58 Years and Older, Female Preventive care refers to lifestyle choices and visits with your health care provider that can promote health and wellness. What does preventive care include?  A yearly physical exam. This is also called an annual well check.  Dental exams once or twice a year.  Routine eye exams. Ask your health care provider how often you should have your eyes checked.  Personal lifestyle choices, including:  Daily care of your teeth and gums.  Regular physical activity.  Eating a healthy diet.  Avoiding tobacco and drug use.  Limiting alcohol use.  Practicing safe sex.  Taking low-dose aspirin every day.  Taking vitamin and mineral supplements as recommended by your health care provider. What happens during an annual well check? The  services and screenings done by your health care provider during your annual well check will depend on your age, overall health, lifestyle risk factors, and family history of disease. Counseling  Your health care provider may ask you questions about your:  Alcohol use.  Tobacco use.  Drug use.  Emotional well-being.  Home and relationship well-being.  Sexual activity.  Eating habits.  History of falls.  Memory and ability to understand (cognition).  Work and work Statistician.  Reproductive health. Screening  You may have the following tests or measurements:  Height, weight, and BMI.  Blood pressure.  Lipid and cholesterol levels. These may be checked every 5 years, or more frequently if you are over 54 years old.  Skin check.  Lung cancer screening. You may have this screening every year starting at age 72 if you have a 30-pack-year history of smoking and currently smoke or have quit within the past 15 years.  Fecal occult blood test (FOBT) of the stool. You may have this test every year starting at age 38.  Flexible sigmoidoscopy or colonoscopy. You may have a sigmoidoscopy every 5 years or a colonoscopy every 10 years starting at age 58.  Hepatitis C blood test.  Hepatitis B blood test.  Sexually transmitted disease (STD) testing.  Diabetes screening. This is done by checking your blood sugar (glucose) after you have not eaten for a while (fasting). You may have this done every 1-3 years.  Bone density scan. This is done to screen for osteoporosis. You may have this done starting at age 33.  Mammogram. This may be done every 1-2 years. Talk to your health care provider about how often you should have regular mammograms. Talk  with your health care provider about your test results, treatment options, and if necessary, the need for more tests. Vaccines  Your health care provider may recommend certain vaccines, such as:  Influenza vaccine. This is recommended  every year.  Tetanus, diphtheria, and acellular pertussis (Tdap, Td) vaccine. You may need a Td booster every 10 years.  Zoster vaccine. You may need this after age 11.  Pneumococcal 13-valent conjugate (PCV13) vaccine. One dose is recommended after age 34.  Pneumococcal polysaccharide (PPSV23) vaccine. One dose is recommended after age 83. Talk to your health care provider about which screenings and vaccines you need and how often you need them. This information is not intended to replace advice given to you by your health care provider. Make sure you discuss any questions you have with your health care provider. Document Released: 11/07/2015 Document Revised: 06/30/2016 Document Reviewed: 08/12/2015 Elsevier Interactive Patient Education  2017 [place] Prevention in the Home Falls can cause injuries. They can happen to people of all ages. There are many things you can do to make your home safe and to help prevent falls. What can I do on the outside of my home?  Regularly fix the edges of walkways and driveways and fix any cracks.  Remove anything that might make you trip as you walk through a door, such as a raised step or threshold.  Trim any bushes or trees on the path to your home.  Use bright outdoor lighting.  Clear any walking paths of anything that might make someone trip, such as rocks or tools.  Regularly check to see if handrails are loose or broken. Make sure that both sides of any steps have handrails.  Any raised decks and porches should have guardrails on the edges.  Have any leaves, snow, or ice cleared regularly.  Use sand or salt on walking paths during winter.  Clean up any spills in your garage right away. This includes oil or grease spills. What can I do in the bathroom?  Use night lights.  Install grab bars by the toilet and in the tub and shower. Do not use towel bars as grab bars.  Use non-skid mats or decals in the tub or shower.  If  you need to sit down in the shower, use a plastic, non-slip stool.  Keep the floor dry. Clean up any water that spills on the floor as soon as it happens.  Remove soap buildup in the tub or shower regularly.  Attach bath mats securely with double-sided non-slip rug tape.  Do not have throw rugs and other things on the floor that can make you trip. What can I do in the bedroom?  Use night lights.  Make sure that you have a light by your bed that is easy to reach.  Do not use any sheets or blankets that are too big for your bed. They should not hang down onto the floor.  Have a firm chair that has side arms. You can use this for support while you get dressed.  Do not have throw rugs and other things on the floor that can make you trip. What can I do in the kitchen?  Clean up any spills right away.  Avoid walking on wet floors.  Keep items that you use a lot in easy-to-reach places.  If you need to reach something above you, use a strong step stool that has a grab bar.  Keep electrical cords out of the way.  Do  not use floor polish or wax that makes floors slippery. If you must use wax, use non-skid floor wax.  Do not have throw rugs and other things on the floor that can make you trip. What can I do with my stairs?  Do not leave any items on the stairs.  Make sure that there are handrails on both sides of the stairs and use them. Fix handrails that are broken or loose. Make sure that handrails are as long as the stairways.  Check any carpeting to make sure that it is firmly attached to the stairs. Fix any carpet that is loose or worn.  Avoid having throw rugs at the top or bottom of the stairs. If you do have throw rugs, attach them to the floor with carpet tape.  Make sure that you have a light switch at the top of the stairs and the bottom of the stairs. If you do not have them, ask someone to add them for you. What else can I do to help prevent falls?  Wear shoes  that:  Do not have high heels.  Have rubber bottoms.  Are comfortable and fit you well.  Are closed at the toe. Do not wear sandals.  If you use a stepladder:  Make sure that it is fully opened. Do not climb a closed stepladder.  Make sure that both sides of the stepladder are locked into place.  Ask someone to hold it for you, if possible.  Clearly mark and make sure that you can see:  Any grab bars or handrails.  First and last steps.  Where the edge of each step is.  Use tools that help you move around (mobility aids) if they are needed. These include:  Canes.  Walkers.  Scooters.  Crutches.  Turn on the lights when you go into a dark area. Replace any light bulbs as soon as they burn out.  Set up your furniture so you have a clear path. Avoid moving your furniture around.  If any of your floors are uneven, fix them.  If there are any pets around you, be aware of where they are.  Review your medicines with your doctor. Some medicines can make you feel dizzy. This can increase your chance of falling. Ask your doctor what other things that you can do to help prevent falls. This information is not intended to replace advice given to you by your health care provider. Make sure you discuss any questions you have with your health care provider. Document Released: 08/07/2009 Document Revised: 03/18/2016 Document Reviewed: 11/15/2014 Elsevier Interactive Patient Education  2017 [employer].

## 2019-07-03 NOTE — Progress Notes (Signed)
Subjective:   Felicia Cisneros is a 68 y.o. female who presents for Medicare Annual (Subsequent) preventive examination.  Review of Systems:   Cardiac Risk Factors include: advanced age (>20men, >5 women);diabetes mellitus;dyslipidemia;hypertension     Objective:     Vitals: BP 130/84 (BP Location: Right Arm, Patient Position: Sitting, Cuff Size: Normal)   Pulse 67   Temp 97.6 F (36.4 C) (Temporal)   Resp 16   Ht 5\' 8"  (1.727 m)   Wt 176 lb 1.6 oz (79.9 kg)   SpO2 98%   BMI 26.78 kg/m   Body mass index is 26.78 kg/m.  Advanced Directives 07/03/2019 06/05/2019 08/10/2018 06/29/2018 02/28/2018 02/20/2018 06/23/2017  Does Patient Have a Medical Advance Directive? Yes No Yes Yes Yes Yes Yes  Type of Paramedic of [place];Living will - [place];Living will;Out of facility DNR (pink MOST or yellow form) [place];Living will [place];Living will [place];Living will -  Does patient want to make changes to medical advance directive? - - No - Patient declined - - - -  Copy of [place] Draft in Chart? No - copy requested - No - copy requested No - copy requested - Yes -  Would patient like information on creating a medical advance directive? - No - Patient declined - - - - -    Tobacco Social History   Tobacco Use  Smoking Status Former Smoker  . Packs/day: 3.00  . Years: 45.00  . Pack years: 135.00  . Types: Cigarettes  . Quit date: 10/25/2009  . Years since quitting: 9.6  Smokeless Tobacco Never Used  Tobacco Comment   smoking cessation materials not required     Counseling given: Not Answered Comment: smoking cessation materials not required   Clinical Intake:  Pre-visit preparation completed: Yes  Pain : No/denies pain     BMI - recorded: 26.78 Nutritional Status: BMI 25 -29 Overweight Nutritional Risks: None Diabetes: Yes CBG done?: No Did pt.  bring in CBG monitor from home?: No   Nutrition Risk Assessment:  Has the patient had any N/V/D within the last 2 months?  No  Does the patient have any non-healing wounds?  No  Has the patient had any unintentional weight loss or weight gain?  No   Diabetes:  Is the patient diabetic?  Yes  If diabetic, was a CBG obtained today?  No  Did the patient bring in their glucometer from home?  No  How often do you monitor your CBG's? Daily fasting in am. .   Financial Strains and Diabetes Management:  Are you having any financial strains with the device, your supplies or your medication? No .  Does the patient want to be seen by Chronic Care Management for management of their diabetes?  No  Would the patient like to be referred to a Nutritionist or for Diabetic Management?  No   Diabetic Exams:  Diabetic Eye Exam: Completed 05/2019. Pt to provide copy of diabetic eye exam.   Diabetic Foot Exam: Completed 09/05/18.   How often do you need to have someone help you when you read instructions, pamphlets, or other written materials from your doctor or pharmacy?: 1 - Never  Interpreter Needed?: No  Information entered by :: Clemetine Marker LPN  Past Medical History:  Diagnosis Date  . Aortic atherosclerosis ([place])    a. 11/2017 noted on Chest CT - Aortic and branch vessel atherosclerosis.  Marland Kitchen  Atrophic vaginitis   . Chest pain    a. 06/2013 ETT: Ex time 6:00, Max HR 136 (87%), 7.1 METS. No ECG changes; b. 07/2018 MV: Ex time: 6:42, nl EF, No ischemia.  Marland Kitchen COPD (chronic obstructive pulmonary disease) ([place])    a. 11/2017 Chest CT: moderate centrilobular emphysema.  . Diabetes mellitus without complication ([place])   . Diverticulitis   . DNAR (do not attempt resuscitation) 06/09/2017   Discussed today, 06/09/2017  . Essential hypertension   . GERD (gastroesophageal reflux disease)   . History of kidney stones   . History of tobacco use   . Hyperlipidemia   . IFG (impaired fasting glucose)   .  Irregular heart beat 2018  . Leukorrhea, not specified as infective   . Osteopenia 05/27/2016   August 2017  . Personal history of tobacco use, presenting hazards to health 08/20/2015  . Pulmonary nodules    a. 11/2017 CT Chest: No change in bilat pulm nodules, including an ant LUL nodule - 5.17mm.   Past Surgical History:  Procedure Laterality Date  . colonoscopy    . COLONOSCOPY WITH PROPOFOL N/A 02/20/2018   Procedure: COLONOSCOPY WITH PROPOFOL;  Surgeon: Lucilla Lame, MD;  Location: [facility];  Service: Endoscopy;  Laterality: N/A;  Diabetic  . CYSTOSCOPY WITH BIOPSY N/A 01/17/2017   Procedure: CYSTOSCOPY WITH BIOPSY;  Surgeon: Hollice Espy, MD;  Location: [facility];  Service: Urology;  Laterality: N/A;  . POLYPECTOMY  02/20/2018   Procedure: POLYPECTOMY;  Surgeon: Lucilla Lame, MD;  Location: [place];  Service: Endoscopy;;  . TMJ ARTHROPLASTY  1984   Family History  Problem Relation Age of Onset  . Stroke Mother   . Hypertension Mother   . Hyperlipidemia Mother   . Diabetes Mother        pre-diabetic  . Diabetes Brother   . Irregular heart beat Brother   . Thyroid disease Sister   . Stroke Maternal Grandmother   . Cancer Maternal Grandfather        bone  . Cancer Sister        cystic fibroid carcinoma  . Ulcerative colitis Sister   . Breast cancer Neg Hx   . Hematuria Neg Hx   . Prostate cancer Neg Hx   . Renal cancer Neg Hx    Social History   Socioeconomic History  . Marital status: Widowed    Spouse name: Charlyne Mom  . Number of children: 0  . Years of education: some college  . Highest education level: 12th grade  Occupational History    Employer: [employer]  Social Needs  . Financial resource strain: Not hard at all  . Food insecurity    Worry: Never true    Inability: Never true  . Transportation needs    Medical: No    Non-medical: No  Tobacco Use  . Smoking status: Former Smoker    Packs/day: 3.00    Years:  45.00    Pack years: 135.00    Types: Cigarettes    Quit date: 10/25/2009    Years since quitting: 9.6  . Smokeless tobacco: Never Used  . Tobacco comment: smoking cessation materials not required  Substance and Sexual Activity  . Alcohol use: Yes    Alcohol/week: 0.0 standard drinks    Comment: Rarely  . Drug use: No  . Sexual activity: Yes  Lifestyle  . Physical activity    Days per week: 0 days    Minutes per session: 0  min  . Stress: Only a little  Relationships  . Social Herbalist on phone: Patient refused    Gets together: Patient refused    Attends religious service: Patient refused    Active member of club or organization: Patient refused    Attends meetings of clubs or organizations: Patient refused    Relationship status: Widowed  Other Topics Concern  . Not on file  Social History Narrative  . Not on file    Outpatient Encounter Medications as of 07/03/2019  Medication Sig  . acetaminophen (TYLENOL) 500 MG tablet Take 1,000 mg by mouth every 6 (six) hours as needed (for pain.).  Marland Kitchen aspirin EC 81 MG tablet Take 81 mg by mouth at bedtime.  Marland Kitchen CINNAMON PO Take 1,000 mg by mouth 2 (two) times daily.  . Coenzyme Q10 (COQ10) 100 MG CAPS One by mouth daily  . famotidine (PEPCID) 20 MG tablet Take 1 tablet (20 mg total) by mouth daily.  Marland Kitchen glucose blood (ONETOUCH VERIO) test strip CHECK FINGERSTICK BLOOD SUGARS THREE TIMES A WEEK  . Lancets (ACCU-CHEK SOFT TOUCH) lancets Check fingerstick blood sugars three times a week; Dx: E11.9, LON:99 months  . lisinopril (ZESTRIL) 5 MG tablet Take 1 tablet (5 mg total) by mouth daily. for blood pressure  . Melatonin 2.5 MG CHEW Chew 2.5 mg by mouth daily as needed.  . metFORMIN (GLUCOPHAGE-XR) 500 MG 24 hr tablet Take 1 tablet (500 mg total) by mouth daily with breakfast.  . Multiple Vitamins-Minerals (CENTRUM SILVER 50+WOMEN) TABS Take 1 tablet by mouth daily.  Marland Kitchen OVER THE COUNTER MEDICATION neuriva plus OTC supplement (like  prevagen)  . PREVIDENT 5000 PLUS 1.1 % CREA dental cream Place 1 application onto teeth daily as needed.   . Probiotic Product (TRUBIOTICS) CAPS Take 1 capsule by mouth daily.  . simvastatin (ZOCOR) 40 MG tablet Take 1 tablet (40 mg total) by mouth at bedtime.  . [DISCONTINUED] fluconazole (DIFLUCAN) 150 MG tablet Take 1 tablet once.  May repeat in 72 hours if not improving.  . [DISCONTINUED] Multiple Vitamin (MULTIVITAMIN WITH MINERALS) TABS tablet Take 1 tablet by mouth daily. CENTRUM SILVER FOR ADULTS 50+  . [DISCONTINUED] Nutritional Supplements (NUTRITIONAL SUPPLEMENT PLUS PO) Take 1 tablet by mouth every evening.   No facility-administered encounter medications on file as of 07/03/2019.     Activities of Daily Living In your present state of health, do you have any difficulty performing the following activities: 07/03/2019 03/06/2019  Hearing? N N  Comment declines hearing aids -  Vision? N N  Difficulty concentrating or making decisions? N N  Walking or climbing stairs? N N  Dressing or bathing? N N  Doing errands, shopping? N N  Preparing Food and eating ? N -  Using the Toilet? N -  In the past six months, have you accidently leaked urine? N -  Do you have problems with loss of bowel control? N -  Managing your Medications? N -  Managing your Finances? N -  Housekeeping or managing your Housekeeping? N -  Some recent data might be hidden    Patient Care Team: [place] as PCP - General (Family Medicine) Rockey Situ, Kathlene November, MD as PCP - Cardiology (Cardiology) Oneta Rack, MD as Consulting Physician (Dermatology) Minna Merritts, MD as Consulting Physician (Cardiology) Lucilla Lame, MD as Consulting Physician (Gastroenterology)    Assessment:   This is a routine wellness examination for [employer].  Exercise Activities and Dietary  recommendations Current Exercise Habits: The patient does not participate in regular exercise at present, Exercise limited  by: None identified  Goals    . DIET - INCREASE WATER INTAKE     Recommend to drink at least 6-8 8oz glasses of water per day.       Fall Risk Fall Risk  07/03/2019 03/22/2019 03/06/2019 09/05/2018 06/29/2018  Falls in the past year? 0 1 1 0 Yes  Comment - - - - -  Number falls in past yr: 0 0 0 0 1  Injury with Fall? 0 0 0 - No  Risk for fall due to : - - - - Impaired vision  Risk for fall due to: Comment - - - - wears eyeglasses  Follow up Falls prevention discussed Falls evaluation completed - - Falls evaluation completed;Education provided;Falls prevention discussed   FALL RISK PREVENTION PERTAINING TO THE HOME:  Any stairs in or around the home? Yes  If so, do they handrails? Yes   Home free of loose throw rugs in walkways, pet beds, electrical cords, etc? Yes  Adequate lighting in your home to reduce risk of falls? Yes   ASSISTIVE DEVICES UTILIZED TO PREVENT FALLS:  Life alert? No  Use of a cane, walker or w/c? No  Grab bars in the bathroom? Yes  Shower chair or bench in shower? No  Elevated toilet seat or a handicapped toilet? No   DME ORDERS:  DME order needed?  No   TIMED UP AND GO:  Was the test performed? Yes .  Length of time to ambulate 10 feet: 5 sec.   GAIT:  Appearance of gait: Gait stead-fast and without the use of an assistive device.   Education: Fall risk prevention has been discussed.  Intervention(s) required? No   Depression Screen PHQ 2/9 Scores 07/03/2019 03/22/2019 03/06/2019 09/05/2018  PHQ - 2 Score 0 0 0 0  PHQ- 9 Score - 0 0 0     Cognitive Function     6CIT Screen 07/03/2019 06/29/2018  What Year? 0 points 0 points  What month? 0 points 0 points  What time? 0 points 0 points  Count back from 20 0 points 0 points  Months in reverse 0 points 0 points  Repeat phrase 4 points 0 points  Total Score 4 0    Immunization History  Administered Date(s) Administered  . Influenza, High Dose Seasonal PF 06/12/2017, 06/29/2018, 06/13/2019  .  Influenza,inj,Quad PF,6+ Mos 08/22/2015  . Influenza-Unspecified 06/15/2017  . Pneumococcal Conjugate-13 06/09/2017  . Pneumococcal Polysaccharide-23 11/27/2015  . Tdap 10/25/2008  . Zoster 02/27/2016    Qualifies for Shingles Vaccine? Yes  Zostavax completed 2017. Due for Shingrix. Education has been provided regarding the importance of this vaccine. Pt has been advised to call insurance company to determine out of pocket expense. Advised may also receive vaccine at local pharmacy or Health Dept. Verbalized acceptance and understanding.  Tdap: Although this vaccine is not a covered service during a Wellness Exam, does the patient still wish to receive this vaccine today?  No .  Education has been provided regarding the importance of this vaccine. Advised may receive this vaccine at local pharmacy or Health Dept. Aware to provide a copy of the vaccination record if obtained from local pharmacy or Health Dept. Verbalized acceptance and understanding.  Flu Vaccine: Up to date  Pneumococcal Vaccine: Up to date   Screening Tests Health Maintenance  Topic Date Due  . TETANUS/TDAP  10/25/2018  . OPHTHALMOLOGY EXAM  01/13/2019  . HEMOGLOBIN A1C  03/06/2019  . MAMMOGRAM  06/09/2019  . FOOT EXAM  09/06/2019  . PNA vac Low Risk Adult (2 of 2 - PPSV23) 11/26/2020  . COLONOSCOPY  02/21/2028  . INFLUENZA VACCINE  Completed  . DEXA SCAN  Completed  . Hepatitis C Screening  Completed    Cancer Screenings:  Colorectal Screening: Completed 02/20/18. Repeat every 10 years;   Mammogram: Completed 06/08/18. Repeat every year. Ordered today. Pt provided with contact information and advised to call to schedule appt.   Bone Density: Completed 05/27/16. Results reflect OSTEOPENIA. Repeat every 2 years. Ordered today. Pt provided with contact information and advised to call to schedule appt.   Lung Cancer Screening: (Low Dose CT Chest recommended if Age 29-80 years, 30 pack-year currently smoking OR have  quit w/in 15years.) does qualify.    Lung Cancer Screening Referral: An Epic message has been sent to Burgess Estelle, RN (Oncology Nurse Navigator) regarding the possible need for this exam. Raquel Sarna will review the patient's chart to determine if the patient truly qualifies for the exam. If the patient qualifies, Raquel Sarna will order the Low Dose CT of the chest to facilitate the scheduling of this exam.  Additional Screening:  Hepatitis C Screening: does qualify; Completed 08/22/15  Vision Screening: Recommended annual ophthalmology exams for early detection of glaucoma and other disorders of the eye. Is the patient up to date with their annual eye exam?  Yes  Who is the provider or what is the name of the office in which the pt attends annual eye exams? Dr. Mallie Mussel  Dental Screening: Recommended annual dental exams for proper oral hygiene  Community Resource Referral:  CRR required this visit?  No      Plan:     I have personally reviewed and addressed the Medicare Annual Wellness questionnaire and have noted the following in the patient's chart:  A. Medical and social history B. Use of alcohol, tobacco or illicit drugs  C. Current medications and supplements D. Functional ability and status E.  Nutritional status F.  Physical activity G. Advance directives H. List of other physicians I.  Hospitalizations, surgeries, and ER visits in previous 12 months J.  [place] such as hearing and vision if needed, cognitive and depression L. Referrals and appointments   In addition, I have reviewed and discussed with patient certain preventive protocols, quality metrics, and best practice recommendations. A written personalized care plan for preventive services as well as general preventive health recommendations were provided to patient.   Signed,  Clemetine Marker, LPN Nurse Health Advisor   Nurse Notes: pt c/o recurrent vaginal irritation, using vagisil estra strength wipes with  some relief. Pt does have new boyfriend and does not want to potentially transmit anything, has not seen gyn. She also c/o swelling in legs and feet in the evenings but wear compression stocking at times to help relieve swelling and usually goes down overnight. Pt last seen in ED 06/05/19 for chest pain related to anxiety attack but not currently on any medication. Pt is fasting today in case she needs labs, seeing Raelyn Ensign NP today as well.

## 2019-07-03 NOTE — Patient Instructions (Signed)

## 2019-07-03 NOTE — Progress Notes (Signed)
Name: Felicia Cisneros   MRN: [id number]    DOB: 03/15/1951   Date:07/03/2019       Progress Note  Subjective  Chief Complaint  No chief complaint on file.   HPI  Vaginal Irritation: Has been having vaginal discomfort for several months.  She has been treated for yeast infection and this did improve her symptoms; now using Vagisil wipes which does help some, but she still has discomfort.  Never went to see GYN due to [place], but would like to go now.  She is dating a bit now, though is not sexually active at this time.  Hypertension: rx lisinopril 5mg  daily and is at BP goal today.  Denies chest pain, shortness of breath, palpitations.  She does have dependent edema - discussed wearing compression stockings daily for about 8 hours a day.   Diabetes mellitus type 2 Checking sugars?  yes How often? Every 2-3 days Range (low to high) over last two weeks:  149 yesterday; otherwise has been around 110. Trying to limit white bread, white rice, white potatoes, sweets?  yes Trying to limit sweetened drinks like iced tea, soft drinks, sports drinks, fruit juices?  yes Checking feet every day/night?  She does check periodically; education provided Last eye exam:  She had recent exam 1 week ago [facility], just need records from her.  Denies: Polyuria, polydipsia, polyphagia, vision changes, or neuropathy. Most recent A1C:  Lab Results  Component Value Date   HGBA1C 5.8 (H) 09/05/2018    We will recheck today. Last CMP Results : is not due for repeat today Urine Micro UTD? Yes Current Medication Management: Diabetic Medications: Metformin ACEI/ARB: Yes Statin: Yes Aspirin therapy: Yes  Emphysema Former smoker- quit 9 years ago, with 135 pack-year history.  Gets annual CT lung screening Denies shortness of breath or coughing.   Hyperlipidemia rx simvastatin 40mg  and 81mg  ASA daily, no missed doses.  Denies chest pain, shortness of breath, or myalgais. Due for labs today.  Did  have episode of Chest pain in August, was determined to be anxiety.   GERD rx famotidine once daily, states works well for her overall, but does take tums PRN as well.. Occasionally gets triggered reflux if she eats supper too late, but doesn't do that often.   Osteopenia: Taking vitamin D supplement in her centrum silver tablets only. She has never had issues with Vitamin D - does drink milk daily.  She is not able to take her exercise class right now, but is walking daily.   Patient Active Problem List   Diagnosis Date Noted  . GERD without esophagitis 03/06/2019  . Benign neoplasm of sigmoid colon   . Lower extremity edema 01/31/2018  . Need for vaccination with 13-polyvalent pneumococcal conjugate vaccine 06/09/2017  . Centrilobular emphysema ([place]) 12/02/2016  . Pulmonary nodules/lesions, multiple 12/02/2016  . Aortic atherosclerosis ([place]) 07/08/2016  . Osteopenia 05/27/2016  . Abnormality of nail tissue 11/27/2015  . Herpes simplex antibody positive 08/22/2015  . Essential hypertension   . Type 2 diabetes mellitus, controlled ([place])   . Atrophic vaginitis   . History of tobacco use   . Abnormal EKG 07/13/2013  . Hyperlipidemia 07/13/2013    Past Surgical History:  Procedure Laterality Date  . colonoscopy    . COLONOSCOPY WITH PROPOFOL N/A 02/20/2018   Procedure: COLONOSCOPY WITH PROPOFOL;  Surgeon: Lucilla Lame, MD;  Location: [place];  Service: Endoscopy;  Laterality: N/A;  Diabetic  . CYSTOSCOPY WITH BIOPSY N/A  01/17/2017   Procedure: CYSTOSCOPY WITH BIOPSY;  Surgeon: Hollice Espy, MD;  Location: [facility];  Service: Urology;  Laterality: N/A;  . POLYPECTOMY  02/20/2018   Procedure: POLYPECTOMY;  Surgeon: Lucilla Lame, MD;  Location: [place];  Service: Endoscopy;;  . TMJ ARTHROPLASTY  1984    Family History  Problem Relation Age of Onset  . Stroke Mother   . Hypertension Mother   . Hyperlipidemia Mother   . Diabetes Mother        pre-diabetic   . Diabetes Brother   . Irregular heart beat Brother   . Thyroid disease Sister   . Stroke Maternal Grandmother   . Cancer Maternal Grandfather        bone  . Cancer Sister        cystic fibroid carcinoma  . Ulcerative colitis Sister   . Breast cancer Neg Hx   . Hematuria Neg Hx   . Prostate cancer Neg Hx   . Renal cancer Neg Hx     Social History   Socioeconomic History  . Marital status: Widowed    Spouse name: Charlyne Mom  . Number of children: 0  . Years of education: some college  . Highest education level: 12th grade  Occupational History    Employer: [employer]  Social Needs  . Financial resource strain: Not hard at all  . Food insecurity    Worry: Never true    Inability: Never true  . Transportation needs    Medical: No    Non-medical: No  Tobacco Use  . Smoking status: Former Smoker    Packs/day: 3.00    Years: 45.00    Pack years: 135.00    Types: Cigarettes    Quit date: 10/25/2009    Years since quitting: 9.6  . Smokeless tobacco: Never Used  . Tobacco comment: smoking cessation materials not required  Substance and Sexual Activity  . Alcohol use: Yes    Alcohol/week: 0.0 standard drinks    Comment: Rarely  . Drug use: No  . Sexual activity: Yes  Lifestyle  . Physical activity    Days per week: 0 days    Minutes per session: 0 min  . Stress: Only a little  Relationships  . Social Herbalist on phone: Patient refused    Gets together: Patient refused    Attends religious service: Patient refused    Active member of club or organization: Patient refused    Attends meetings of clubs or organizations: Patient refused    Relationship status: Widowed  . Intimate partner violence    Fear of current or ex partner: No    Emotionally abused: No    Physically abused: No    Forced sexual activity: No  Other Topics Concern  . Not on file  Social History Narrative  . Not on file     Current Outpatient Medications:  .   acetaminophen (TYLENOL) 500 MG tablet, Take 1,000 mg by mouth every 6 (six) hours as needed (for pain.)., Disp: , Rfl:  .  aspirin EC 81 MG tablet, Take 81 mg by mouth at bedtime., Disp: , Rfl:  .  CINNAMON PO, Take 1,000 mg by mouth 2 (two) times daily., Disp: , Rfl:  .  Coenzyme Q10 (COQ10) 100 MG CAPS, One by mouth daily, Disp: , Rfl:  .  famotidine (PEPCID) 20 MG tablet, Take 1 tablet (20 mg total) by mouth daily., Disp: 90 tablet, Rfl: 1 .  glucose blood (ONETOUCH VERIO) test strip, CHECK FINGERSTICK BLOOD SUGARS THREE TIMES A WEEK, Disp: 100 each, Rfl: 1 .  Lancets (ACCU-CHEK SOFT TOUCH) lancets, Check fingerstick blood sugars three times a week; Dx: E11.9, LON:99 months, Disp: 100 each, Rfl: 1 .  lisinopril (ZESTRIL) 5 MG tablet, Take 1 tablet (5 mg total) by mouth daily. for blood pressure, Disp: 90 tablet, Rfl: 1 .  Melatonin 2.5 MG CHEW, Chew 2.5 mg by mouth daily as needed., Disp: , Rfl:  .  metFORMIN (GLUCOPHAGE-XR) 500 MG 24 hr tablet, Take 1 tablet (500 mg total) by mouth daily with breakfast., Disp: 90 tablet, Rfl: 1 .  Multiple Vitamins-Minerals (CENTRUM SILVER 50+WOMEN) TABS, Take 1 tablet by mouth daily., Disp: , Rfl:  .  OVER THE COUNTER MEDICATION, neuriva plus OTC supplement (like prevagen), Disp: , Rfl:  .  PREVIDENT 5000 PLUS 1.1 % CREA dental cream, Place 1 application onto teeth daily as needed. , Disp: , Rfl:  .  Probiotic Product (TRUBIOTICS) CAPS, Take 1 capsule by mouth daily., Disp: , Rfl:  .  simvastatin (ZOCOR) 40 MG tablet, Take 1 tablet (40 mg total) by mouth at bedtime., Disp: 90 tablet, Rfl: 1  No Known Allergies  I personally reviewed active problem list, medication list, allergies, notes from last encounter, lab results with the patient/caregiver today.   ROS  Constitutional: Negative for fever or weight change.  Respiratory: Negative for cough and shortness of breath.   Cardiovascular: Negative for chest pain or palpitations.  Gastrointestinal:  Negative for abdominal pain, no bowel changes.  Musculoskeletal: Negative for gait problem or joint swelling.  Skin: Negative for rash.  Neurological: Negative for dizziness or headache.  No other specific complaints in a complete review of systems (except as listed in HPI above).  Objective  Vitals:   07/03/19 0932  BP: 130/84  Pulse: 67  Resp: 14  Temp: 97.6 F (36.4 C)  TempSrc: Temporal  SpO2: 98%  Weight: 176 lb 1.6 oz (79.9 kg)  Height: 5\' 8"  (1.727 m)    Body mass index is 26.78 kg/m.  Physical Exam  Constitutional: Patient appears well-developed and well-nourished. No distress.  HENT: Head: Normocephalic and atraumatic. Eyes: Conjunctivae and EOM are normal. No scleral icterus.   Neck: Normal range of motion. Neck supple. No JVD present.  Cardiovascular: Normal rate, regular rhythm and normal heart sounds.  No murmur heard. No BLE edema. Pulmonary/Chest: Effort normal and breath sounds normal. No respiratory distress. Musculoskeletal: Normal range of motion, no joint effusions. No gross deformities Neurological: Pt is alert and oriented to person, place, and time. No cranial nerve deficit. Coordination, balance, strength, speech and gait are normal.  Skin: Skin is warm and dry. No rash noted. No erythema.  Psychiatric: Patient has a normal mood and affect. behavior is normal. Judgment and thought content normal.  No results found for this or any previous visit (from the past 72 hour(s)).  PHQ2/9: Depression screen [facility] 2/9 07/03/2019 03/22/2019 03/06/2019 09/05/2018 06/29/2018  Decreased Interest 0 0 0 0 0  Down, Depressed, Hopeless 0 0 0 0 0  PHQ - 2 Score 0 0 0 0 0  Altered sleeping - 0 0 0 0  Tired, decreased energy - 0 0 0 0  Change in appetite - 0 0 0 0  Feeling bad or failure about yourself  - 0 0 0 0  Trouble concentrating - 0 0 0 0  Moving slowly or fidgety/restless - 0 0 0 0  Suicidal thoughts -  0 0 0 0  PHQ-9 Score - 0 0 0 0  Difficult doing work/chores  - Not difficult at all Not difficult at all Not difficult at all Not difficult at all   PHQ-2/9 Result is negative.    Fall Risk: Fall Risk  07/03/2019 03/22/2019 03/06/2019 09/05/2018 06/29/2018  Falls in the past year? 0 1 1 0 Yes  Comment - - - - -  Number falls in past yr: 0 0 0 0 1  Injury with Fall? 0 0 0 - No  Risk for fall due to : - - - - Impaired vision  Risk for fall due to: Comment - - - - wears eyeglasses  Follow up Falls prevention discussed Falls evaluation completed - - Falls evaluation completed;Education provided;Falls prevention discussed    Assessment & Plan  1. Essential hypertension - Continue Lisinopril low dose; Last CMP in August was WNL. - Compression stockings daily.  2. Controlled type 2 diabetes mellitus without complication, without long-term current use of insulin (HCC) - A1C today, continue Metformin - Hemoglobin A1c - glucose blood (ONETOUCH VERIO) test strip; CHECK FINGERSTICK BLOOD SUGARS THREE TIMES A WEEK  Dispense: 100 each; Refill: 3  3. Centrilobular emphysema ([place]) - Doing well.  4. Aortic atherosclerosis (HCC) - Statin therapy  5. Mixed hyperlipidemia - Statin therapy - Lipid panel  6. GERD without esophagitis - Famotidine; may take 1 additional dose if needed at night.  7. Osteopenia of neck of left femur - Weight bearing exercises.   8. Vaginal irritation - Ambulatory referral to Gynecology

## 2019-07-03 NOTE — Telephone Encounter (Signed)
[facility] referring for Vaginal irritation. Called and left voicemail for patient to call back to be schedule

## 2019-07-04 ENCOUNTER — Telehealth: Payer: Self-pay | Admitting: *Deleted

## 2019-07-04 LAB — LIPID PANEL
Cholesterol: 139 mg/dL (ref <200)
HDL: 59 mg/dL (ref >=50)
LDL Cholesterol (Calc): 60 mg/dL (calc)
Non-HDL Cholesterol (Calc): 80 mg/dL (calc) (ref <130)
Total CHOL/HDL Ratio: 2.4 (calc) (ref <5.0)
Triglycerides: 121 mg/dL (ref <150)

## 2019-07-04 LAB — HEMOGLOBIN A1C
Hgb A1c MFr Bld: 5.7 % of total Hgb — ABNORMAL HIGH (ref <5.7)
Mean Plasma Glucose: 117 (calc)
eAG (mmol/L): 6.5 (calc)

## 2019-07-04 NOTE — Telephone Encounter (Signed)
Left message for patient to notify them that it is time to schedule annual low dose lung cancer screening CT scan. Instructed patient to call back to verify information prior to the scan being scheduled.  

## 2019-07-04 NOTE — Telephone Encounter (Signed)
Called and left voice mail for patient to call back to be schedule °

## 2019-07-05 ENCOUNTER — Telehealth: Payer: Self-pay | Admitting: *Deleted

## 2019-07-05 DIAGNOSIS — Z87891 Personal history of nicotine dependence: Secondary | ICD-10-CM

## 2019-07-05 DIAGNOSIS — Z122 Encounter for screening for malignant neoplasm of respiratory organs: Secondary | ICD-10-CM

## 2019-07-05 NOTE — Telephone Encounter (Signed)
Patient has been notified that annual lung cancer screening low dose CT scan is due currently or will be in near future. Confirmed that patient is within the age range of 55-77, and asymptomatic, (no signs or symptoms of lung cancer). Patient denies illness that would prevent curative treatment for lung cancer if found. Verified smoking history, (former, quit 2012, 88 pack year). The shared decision making visit was done 07/26/14. Patient is agreeable for CT scan being scheduled.

## 2019-07-06 ENCOUNTER — Encounter: Payer: Self-pay | Admitting: Obstetrics and Gynecology

## 2019-07-07 NOTE — Progress Notes (Signed)
[facility] [place], [place]   Chief Complaint  Patient presents with  . Vaginal Irritation    little itchiness, no discharge/odor x a couple months (on/off)    HPI:      [name] is a 68 y.o. No obstetric history on file. who LMP was No LMP recorded. Patient is postmenopausal., presents today for NP ref by PCP for eval of slight vaginal discomfort sometimes with urination, for several months. Sx not every time and not without urination, and resolve after wiping. No increased d/c or odor. Tried vagisil wipes, desitin with some improvement but not relief. Treated with diflucan by PCP without relief. Pt changed soap to dove sens skin soap and uses free and clear dryer sheets. Hx of vaginal atrophy due to menopause but no tx with vag ERT. No sex activity in over 10 yrs. No PMB. Hx of DM, controlled with meds.   Past Medical History:  Diagnosis Date  . Aortic atherosclerosis ([place])    a. 11/2017 noted on Chest CT - Aortic and branch vessel atherosclerosis.  . Atrophic vaginitis   . Chest pain    a. 06/2013 ETT: Ex time 6:00, Max HR 136 (87%), 7.1 METS. No ECG changes; b. 07/2018 MV: Ex time: 6:42, nl EF, No ischemia.  Marland Kitchen COPD (chronic obstructive pulmonary disease) ([place])    a. 11/2017 Chest CT: moderate centrilobular emphysema.  . Diabetes mellitus without complication ([place])   . Diverticulitis   . DNAR (do not attempt resuscitation) 06/09/2017   Discussed today, 06/09/2017  . Essential hypertension   . GERD (gastroesophageal reflux disease)   . History of kidney stones   . History of tobacco use   . Hyperlipidemia   . IFG (impaired fasting glucose)   . Irregular heart beat 2018  . Leukorrhea, not specified as infective   . Osteopenia 05/27/2016   August 2017  . Personal history of tobacco use, presenting hazards to health 08/20/2015  . Pulmonary nodules    a. 11/2017 CT Chest: No change in bilat pulm nodules, including an ant LUL nodule - 5.7mm.    Past Surgical  History:  Procedure Laterality Date  . colonoscopy    . COLONOSCOPY WITH PROPOFOL N/A 02/20/2018   Procedure: COLONOSCOPY WITH PROPOFOL;  Surgeon: Lucilla Lame, MD;  Location: [place];  Service: Endoscopy;  Laterality: N/A;  Diabetic  . CYSTOSCOPY WITH BIOPSY N/A 01/17/2017   Procedure: CYSTOSCOPY WITH BIOPSY;  Surgeon: Hollice Espy, MD;  Location: [facility];  Service: Urology;  Laterality: N/A;  . POLYPECTOMY  02/20/2018   Procedure: POLYPECTOMY;  Surgeon: Lucilla Lame, MD;  Location: [place];  Service: Endoscopy;;  . TMJ ARTHROPLASTY  1984    Family History  Problem Relation Age of Onset  . Stroke Mother   . Hypertension Mother   . Hyperlipidemia Mother   . Diabetes Mother        pre-diabetic  . Diabetes Brother   . Irregular heart beat Brother   . Thyroid disease Sister   . Stroke Maternal Grandmother   . Cancer Maternal Grandfather        bone  . Cancer Sister        cystic fibroid carcinoma  . Ulcerative colitis Sister   . Breast cancer Neg Hx   . Hematuria Neg Hx   . Prostate cancer Neg Hx   . Renal cancer Neg Hx     Social History   Socioeconomic History  . Marital status: Widowed  Spouse name: Charlyne Mom  . Number of children: 0  . Years of education: some college  . Highest education level: 12th grade  Occupational History    Employer: [employer]  Social Needs  . Financial resource strain: Not hard at all  . Food insecurity    Worry: Never true    Inability: Never true  . Transportation needs    Medical: No    Non-medical: No  Tobacco Use  . Smoking status: Former Smoker    Packs/day: 3.00    Years: 45.00    Pack years: 135.00    Types: Cigarettes    Quit date: 10/25/2009    Years since quitting: 9.7  . Smokeless tobacco: Never Used  . Tobacco comment: smoking cessation materials not required  Substance and Sexual Activity  . Alcohol use: Yes    Alcohol/week: 0.0 standard drinks    Comment: Rarely  .  Drug use: No  . Sexual activity: Not Currently  Lifestyle  . Physical activity    Days per week: 0 days    Minutes per session: 0 min  . Stress: Only a little  Relationships  . Social Herbalist on phone: Patient refused    Gets together: Patient refused    Attends religious service: Patient refused    Active member of club or organization: Patient refused    Attends meetings of clubs or organizations: Patient refused    Relationship status: Widowed  . Intimate partner violence    Fear of current or ex partner: No    Emotionally abused: No    Physically abused: No    Forced sexual activity: No  Other Topics Concern  . Not on file  Social History Narrative  . Not on file    Outpatient Medications Prior to Visit  Medication Sig Dispense Refill  . acetaminophen (TYLENOL) 500 MG tablet Take 1,000 mg by mouth every 6 (six) hours as needed (for pain.).    Marland Kitchen aspirin EC 81 MG tablet Take 81 mg by mouth at bedtime.    Marland Kitchen CINNAMON PO Take 1,000 mg by mouth 2 (two) times daily.    . Coenzyme Q10 (COQ10) 100 MG CAPS One by mouth daily    . famotidine (PEPCID) 20 MG tablet Take 1 tablet (20 mg total) by mouth daily. 90 tablet 1  . glucose blood (ONETOUCH VERIO) test strip CHECK FINGERSTICK BLOOD SUGARS THREE TIMES A WEEK 100 each 3  . Lancets (ACCU-CHEK SOFT TOUCH) lancets Check fingerstick blood sugars three times a week; Dx: E11.9, LON:99 months 100 each 1  . lisinopril (ZESTRIL) 5 MG tablet Take 1 tablet (5 mg total) by mouth daily. for blood pressure 90 tablet 1  . Melatonin 2.5 MG CHEW Chew 2.5 mg by mouth daily as needed.    . metFORMIN (GLUCOPHAGE-XR) 500 MG 24 hr tablet Take 1 tablet (500 mg total) by mouth daily with breakfast. 90 tablet 1  . Multiple Vitamins-Minerals (CENTRUM SILVER 50+WOMEN) TABS Take 1 tablet by mouth daily.    Marland Kitchen OVER THE COUNTER MEDICATION neuriva plus OTC supplement (like prevagen)    . PREVIDENT 5000 PLUS 1.1 % CREA dental cream Place 1 application  onto teeth daily as needed.     . Probiotic Product (TRUBIOTICS) CAPS Take 1 capsule by mouth daily.    . simvastatin (ZOCOR) 40 MG tablet Take 1 tablet (40 mg total) by mouth at bedtime. 90 tablet 1   No facility-administered medications prior to visit.  ROS:  Review of Systems  Constitutional: Negative for fatigue, fever and unexpected weight change.  Respiratory: Negative for cough, shortness of breath and wheezing.   Cardiovascular: Negative for chest pain, palpitations and leg swelling.  Gastrointestinal: Negative for blood in stool, constipation, diarrhea, nausea and vomiting.  Endocrine: Negative for cold intolerance, heat intolerance and polyuria.  Genitourinary: Positive for vaginal pain. Negative for dyspareunia, dysuria, flank pain, frequency, genital sores, hematuria, menstrual problem, pelvic pain, urgency, vaginal bleeding and vaginal discharge.  Musculoskeletal: Negative for back pain, joint swelling and myalgias.  Skin: Negative for rash.  Neurological: Negative for dizziness, syncope, light-headedness, numbness and headaches.  Hematological: Negative for adenopathy.  Psychiatric/Behavioral: Negative for agitation, confusion, sleep disturbance and suicidal ideas. The patient is not nervous/anxious.      OBJECTIVE:   Vitals:  BP 114/90   Ht 5\' 8"  (1.727 m)   Wt 177 lb (80.3 kg)   BMI 26.91 kg/m   Physical Exam Vitals signs reviewed.  Constitutional:      Appearance: She is well-developed.  Neck:     Musculoskeletal: Normal range of motion.  Pulmonary:     Effort: Pulmonary effort is normal.  Genitourinary:    General: Normal vulva.     Pubic Area: No rash.      Labia:        Right: Rash present. No tenderness or lesion.        Left: Rash present. No tenderness or lesion.      Vagina: Normal. No vaginal discharge, erythema or tenderness.     Cervix: Normal.     Uterus: Normal. Not enlarged and not tender.      Adnexa: Right adnexa normal and left  adnexa normal.       Right: No mass or tenderness.         Left: No mass or tenderness.      Musculoskeletal: Normal range of motion.  Skin:    General: Skin is warm and dry.  Neurological:     General: No focal deficit present.     Mental Status: She is alert and oriented to person, place, and time.  Psychiatric:        Mood and Affect: Mood normal.        Behavior: Behavior normal.        Thought Content: Thought content normal.        Judgment: Judgment normal.     Results: Results for orders placed or performed in visit on 07/09/19 (from the past 24 hour(s))  POCT Wet Prep with KOH     Status: Normal   Collection Time: 07/09/19  9:45 AM  Result Value Ref Range   Trichomonas, UA Negative    Clue Cells Wet Prep HPF POC NEG    Epithelial Wet Prep HPF POC     Yeast Wet Prep HPF POC NEG    Bacteria Wet Prep HPF POC     RBC Wet Prep HPF POC     WBC Wet Prep HPF POC     KOH Prep POC Negative Negative     Assessment/Plan: Subacute vaginitis - Plan: clotrimazole-betamethasone (LOTRISONE) cream, POCT Wet Prep with KOH; Sx mild and occas for several months, but exam shows several areas of erythema. Treat empirically with lotrisone crm BID for 2 wks. Line dry underwear, stop other wipes/tx. RTO in 2 wks for re-eval. If still abn, will need bx.   Meds ordered this encounter  Medications  . clotrimazole-betamethasone (LOTRISONE) cream  Sig: Apply externally BID for 2 wks    Dispense:  15 g    Refill:  0    Order Specific Question:   Supervising Provider    Answer:   Gae Dry [id number]      Return in about 2 weeks (around 07/23/2019) for vaginits f/u.  Kwaku Mostafa B. Keyonna [place], PA-C 07/09/2019 9:48 AM

## 2019-07-09 ENCOUNTER — Encounter: Payer: Self-pay | Admitting: Obstetrics and Gynecology

## 2019-07-09 ENCOUNTER — Ambulatory Visit: Payer: Medicare Other | Admitting: Obstetrics and Gynecology

## 2019-07-09 ENCOUNTER — Other Ambulatory Visit: Payer: Self-pay

## 2019-07-09 VITALS — BP 114/90 | Ht 68.0 in | Wt 177.0 lb

## 2019-07-09 DIAGNOSIS — N761 Subacute and chronic vaginitis: Secondary | ICD-10-CM

## 2019-07-09 LAB — POCT WET PREP WITH KOH
Clue Cells Wet Prep HPF POC: NEGATIVE
KOH Prep POC: NEGATIVE
Trichomonas, UA: NEGATIVE
Yeast Wet Prep HPF POC: NEGATIVE

## 2019-07-09 MED ORDER — CLOTRIMAZOLE-BETAMETHASONE 1-0.05 % EX CREA: TOPICAL_CREAM | CUTANEOUS | 0 refills | Status: DC

## 2019-07-09 NOTE — Patient Instructions (Signed)
I value your feedback and entrusting us with your care. If you get a [facility] patient survey, I would appreciate you taking the time to let us know about your experience today. Thank you! 

## 2019-07-11 ENCOUNTER — Telehealth: Payer: Self-pay

## 2019-07-11 DIAGNOSIS — N761 Subacute and chronic vaginitis: Secondary | ICD-10-CM

## 2019-07-11 MED ORDER — CLOTRIMAZOLE-BETAMETHASONE 1-0.05 % EX CREA: TOPICAL_CREAM | CUTANEOUS | 0 refills | Status: DC

## 2019-07-11 NOTE — Telephone Encounter (Signed)
Patient saw [facility] 07/09/2019 & a rx for cream to [facility]. When [facility] tried to fill it, it was for a 3 month dose & she only needs to take it for 2 weeks. She has cancelled rx w/[facility] and needs it sent to [facility] for the smallest amount. Cb#[phone]

## 2019-07-11 NOTE — Telephone Encounter (Signed)
Spoke w/[facility]. 15 g tube is smallest amount and what was sent to [facility]. Ok per [facility] to resend to [facility]. Pt notified. Per pt [facility] was charging her $41. That's why she cancelled. Advised we are uncertain how much the cream is but will [facility] per her request. (same amount/instructions as send to [facility])

## 2019-07-13 ENCOUNTER — Other Ambulatory Visit: Payer: Self-pay

## 2019-07-13 ENCOUNTER — Ambulatory Visit
Admission: RE | Admit: 2019-07-13 | Discharge: 2019-07-13 | Disposition: A | Discharge status: Home / Self Care | Payer: Medicare Other | Source: Ambulatory Visit | Attending: Nurse Practitioner | Admitting: Nurse Practitioner

## 2019-07-13 DIAGNOSIS — Z122 Encounter for screening for malignant neoplasm of respiratory organs: Secondary | ICD-10-CM | POA: Insufficient documentation

## 2019-07-13 DIAGNOSIS — Z87891 Personal history of nicotine dependence: Secondary | ICD-10-CM | POA: Diagnosis not present

## 2019-07-14 ENCOUNTER — Telehealth: Payer: Self-pay | Admitting: Family Medicine

## 2019-07-14 DIAGNOSIS — E041 Nontoxic single thyroid nodule: Secondary | ICD-10-CM

## 2019-07-14 NOTE — Telephone Encounter (Signed)
CT Scan of the chest picked up on a thyroid nodule that the radiologist recommended we follow up on with lab work and a thyroid ultrasound.  I am placing these orders today, please let the patient know and if she has any questions, I am happy to answer them.  She needs to come here for labs, and call back if no one calls to schedule her thyroid US in the next 1-2 weeks.

## 2019-07-16 ENCOUNTER — Encounter: Payer: Self-pay | Admitting: Obstetrics and Gynecology

## 2019-07-16 ENCOUNTER — Encounter: Payer: Self-pay | Admitting: Family Medicine

## 2019-07-17 ENCOUNTER — Other Ambulatory Visit: Payer: Self-pay | Admitting: Family Medicine

## 2019-07-17 ENCOUNTER — Encounter: Payer: Self-pay | Admitting: *Deleted

## 2019-07-17 ENCOUNTER — Encounter: Payer: Self-pay | Admitting: Family Medicine

## 2019-07-17 DIAGNOSIS — K219 Gastro-esophageal reflux disease without esophagitis: Secondary | ICD-10-CM

## 2019-07-17 MED ORDER — FAMOTIDINE 20 MG PO TABS: 20.0000 mg | ORAL_TABLET | Freq: Two times a day (BID) | ORAL | 0 refills | Status: DC

## 2019-07-17 NOTE — Telephone Encounter (Signed)
Patient notified  And will have [place] on 9/23

## 2019-07-18 ENCOUNTER — Ambulatory Visit: Payer: Medicare Other

## 2019-07-19 ENCOUNTER — Ambulatory Visit
Admission: RE | Admit: 2019-07-19 | Discharge: 2019-07-19 | Disposition: A | Discharge status: Home / Self Care | Payer: Medicare Other | Source: Ambulatory Visit | Attending: Family Medicine | Admitting: Family Medicine

## 2019-07-19 ENCOUNTER — Other Ambulatory Visit: Payer: Self-pay

## 2019-07-19 DIAGNOSIS — E041 Nontoxic single thyroid nodule: Secondary | ICD-10-CM | POA: Diagnosis not present

## 2019-07-19 DIAGNOSIS — E042 Nontoxic multinodular goiter: Secondary | ICD-10-CM | POA: Diagnosis not present

## 2019-07-21 ENCOUNTER — Encounter: Payer: Self-pay | Admitting: Family Medicine

## 2019-07-23 ENCOUNTER — Encounter: Payer: Self-pay | Admitting: Family Medicine

## 2019-07-23 ENCOUNTER — Encounter: Payer: Self-pay | Admitting: Obstetrics and Gynecology

## 2019-08-07 ENCOUNTER — Telehealth: Payer: Self-pay | Admitting: Cardiovascular Disease

## 2019-08-07 NOTE — Telephone Encounter (Signed)
Call to patient r/t stress test indication. I asked patient to send [place] fax number for insurance and we will send last OV note.   Pt will call back or send in mychart.

## 2019-08-07 NOTE — Telephone Encounter (Signed)
Patient calling Patient was seen by Angelica Ran on 08/15/18 - he ordered a stress test, which patient completed  Patient will need to know why CSharolyn Douglas ordered stress test  Patient is having trouble with insurance paying, needs clarification  Please call to discuss

## 2019-08-14 ENCOUNTER — Ambulatory Visit
Admission: RE | Admit: 2019-08-14 | Discharge: 2019-08-14 | Disposition: A | Discharge status: Home / Self Care | Payer: Medicare Other | Source: Ambulatory Visit | Attending: Family Medicine | Admitting: Family Medicine

## 2019-08-14 DIAGNOSIS — Z780 Asymptomatic menopausal state: Secondary | ICD-10-CM | POA: Diagnosis not present

## 2019-08-14 DIAGNOSIS — Z1231 Encounter for screening mammogram for malignant neoplasm of breast: Secondary | ICD-10-CM

## 2019-08-14 DIAGNOSIS — M8580 Other specified disorders of bone density and structure, unspecified site: Secondary | ICD-10-CM | POA: Insufficient documentation

## 2019-08-14 DIAGNOSIS — Z13820 Encounter for screening for osteoporosis: Secondary | ICD-10-CM | POA: Insufficient documentation

## 2019-08-28 ENCOUNTER — Encounter: Payer: Self-pay | Admitting: Family Medicine

## 2019-08-29 ENCOUNTER — Other Ambulatory Visit: Payer: Self-pay

## 2019-08-29 DIAGNOSIS — I700 Atherosclerosis of aorta: Secondary | ICD-10-CM

## 2019-08-29 DIAGNOSIS — E782 Mixed hyperlipidemia: Secondary | ICD-10-CM

## 2019-08-30 MED ORDER — SIMVASTATIN 40 MG PO TABS: 40.0000 mg | ORAL_TABLET | Freq: Every day | ORAL | 3 refills | Status: DC

## 2019-09-06 ENCOUNTER — Other Ambulatory Visit: Payer: Self-pay | Admitting: Family Medicine

## 2019-09-06 DIAGNOSIS — K219 Gastro-esophageal reflux disease without esophagitis: Secondary | ICD-10-CM

## 2019-09-13 ENCOUNTER — Encounter: Payer: Self-pay | Admitting: Family Medicine

## 2019-09-14 ENCOUNTER — Other Ambulatory Visit: Payer: Self-pay

## 2019-09-14 DIAGNOSIS — I10 Essential (primary) hypertension: Secondary | ICD-10-CM

## 2019-09-14 DIAGNOSIS — E119 Type 2 diabetes mellitus without complications: Secondary | ICD-10-CM

## 2019-09-14 NOTE — Telephone Encounter (Signed)
Request for diabetes medication. Metformin to [facility]  Last office visit pertaining to diabetes: 07/03/2019   Lab Results  Component Value Date   HGBA1C 5.7 (H) 07/03/2019   Hypertension medication request: Lisinopril   BP Readings from Last 3 Encounters:  07/09/19 114/90  07/03/19 130/84  07/03/19 130/84    Lab Results  Component Value Date   CREATININE 0.70 06/05/2019   BUN 19 06/05/2019   NA 138 06/05/2019   K 3.8 06/05/2019   CL 104 06/05/2019   CO2 26 06/05/2019      Follow up on 10/02/2019

## 2019-09-16 MED ORDER — LISINOPRIL 5 MG PO TABS: 5.0000 mg | ORAL_TABLET | Freq: Every day | ORAL | 1 refills | Status: DC

## 2019-09-16 MED ORDER — METFORMIN HCL ER 500 MG PO TB24: 500.0000 mg | ORAL_TABLET | Freq: Every day | ORAL | 1 refills | Status: DC

## 2019-09-16 NOTE — Progress Notes (Signed)
Cardiology Office Note  Date:  09/17/2019   ID:  Felicia Cisneros, DOB Dec 08, 1950, MRN [id number]  PCP:  [facility]   Chief Complaint  Patient presents with  . other    12 month f/u no complaints today. Meds reviewed verbally with pt.    HPI:  Felicia Cisneros is a very pleasant 68 year old woman with long history of  smoking, quit >5 yrs ago Hyperlipidemia , on statin Remote dizziness and numbness on prior evaluation in 2014,  osteoporosis  CT chest 2016:  No significant coronary ca Mild aorta plaque who presents for follow up of her type 2 diabetes, aorta atherosclerosis  Husband died 4 yrs ago from lung CA She has recovered Walked 6 miles yesterday for exercise, slept well Doing well in general Walks with neighbor  Denies any significant shortness of breath or chest pain on exertion Takes melatonin for sleep  Simvastatin  20 mg  Total chol 139, LDL 60 HBA1C 5.7  EKG personally reviewed by myself on todays visit Shows normal sinus rhythm with rate 71 bpm, no significant ST or T wave changes   Other past medical history reviewed CT chest 2016:  No significant coronary ca Mild diffuse aortic atherosclerosis Seen in the aortic arch, descending aorta   She has osteopenia noted on the DEXA, Hypertension, Type 2 diabetes; hyperlipidemia,   PMH:   has a past medical history of Aortic atherosclerosis ([place]), Atrophic vaginitis, Chest pain, COPD (chronic obstructive pulmonary disease) ([place]), Diabetes mellitus without complication ([place]), Diverticulitis, DNAR (do not attempt resuscitation) (06/09/2017), Essential hypertension, GERD (gastroesophageal reflux disease), History of kidney stones, History of tobacco use, Hyperlipidemia, IFG (impaired fasting glucose), Irregular heart beat (2018), Leukorrhea, not specified as infective, Osteopenia (05/27/2016), Personal history of tobacco use, presenting hazards to health (08/20/2015), and Pulmonary nodules.  PSH:    Past  Surgical History:  Procedure Laterality Date  . colonoscopy    . COLONOSCOPY WITH PROPOFOL N/A 02/20/2018   Procedure: COLONOSCOPY WITH PROPOFOL;  Surgeon: Lucilla Lame, MD;  Location: [facility];  Service: Endoscopy;  Laterality: N/A;  Diabetic  . CYSTOSCOPY WITH BIOPSY N/A 01/17/2017   Procedure: CYSTOSCOPY WITH BIOPSY;  Surgeon: Hollice Espy, MD;  Location: [facility];  Service: Urology;  Laterality: N/A;  . POLYPECTOMY  02/20/2018   Procedure: POLYPECTOMY;  Surgeon: Lucilla Lame, MD;  Location: [place];  Service: Endoscopy;;  . TMJ ARTHROPLASTY  1984    Current Outpatient Medications  Medication Sig Dispense Refill  . acetaminophen (TYLENOL) 500 MG tablet Take 1,000 mg by mouth every 6 (six) hours as needed (for pain.).    Marland Kitchen aspirin EC 81 MG tablet Take 81 mg by mouth at bedtime.    Marland Kitchen BIOTIN PO Take by mouth daily.    Marland Kitchen CINNAMON PO Take 1,000 mg by mouth 2 (two) times daily.    . clotrimazole-betamethasone (LOTRISONE) cream Apply externally BID for 2 wks 15 g 0  . Coenzyme Q10 (COQ10) 100 MG CAPS One by mouth daily    . famotidine (PEPCID) 20 MG tablet TAKE 1 TABLET BY MOUTH  TWICE DAILY 180 tablet 0  . glucose blood (ONETOUCH VERIO) test strip CHECK FINGERSTICK BLOOD SUGARS THREE TIMES A WEEK 100 each 3  . Lancets (ACCU-CHEK SOFT TOUCH) lancets Check fingerstick blood sugars three times a week; Dx: E11.9, LON:99 months 100 each 1  . lisinopril (ZESTRIL) 5 MG tablet Take 1 tablet (5 mg total) by mouth daily. for blood pressure 90 tablet 1  .  LYSINE PO Take by mouth as needed.    . Melatonin 2.5 MG CHEW Chew 2.5 mg by mouth daily as needed.    . metFORMIN (GLUCOPHAGE-XR) 500 MG 24 hr tablet Take 1 tablet (500 mg total) by mouth daily with breakfast. 90 tablet 1  . Multiple Vitamins-Minerals (CENTRUM SILVER 50+WOMEN) TABS Take 1 tablet by mouth daily.    Marland Kitchen OVER THE COUNTER MEDICATION neuriva plus OTC supplement (like prevagen)    . PREVIDENT 5000 PLUS 1.1 % CREA  dental cream Place 1 application onto teeth daily as needed.     . Probiotic Product (TRUBIOTICS) CAPS Take 1 capsule by mouth daily.    . simvastatin (ZOCOR) 40 MG tablet Take 1 tablet (40 mg total) by mouth at bedtime. 90 tablet 3   No current facility-administered medications for this visit.      Allergies:   Patient has no known allergies.   Social History:  The patient  reports that she quit smoking about 9 years ago. Her smoking use included cigarettes. She has a 135.00 pack-year smoking history. She has never used smokeless tobacco. She reports current alcohol use. She reports that she does not use drugs.   Family History:   family history includes Cancer in her maternal grandfather and sister; Diabetes in her brother and mother; Hyperlipidemia in her mother; Hypertension in her mother; Irregular heart beat in her brother; Stroke in her maternal grandmother and mother; Thyroid disease in her sister; Ulcerative colitis in her sister.    Review of Systems: Review of Systems  Constitutional: Negative.   Respiratory: Negative.   Cardiovascular: Negative.   Gastrointestinal: Negative.   Musculoskeletal: Negative.   Neurological: Negative.   Psychiatric/Behavioral: Negative.   All other systems reviewed and are negative.   PHYSICAL EXAM: VS:  BP 130/90 (BP Location: Left Arm, Patient Position: Sitting, Cuff Size: Normal)   Pulse 71   Ht 5\' 8"  (1.727 m)   Wt 177 lb 8 oz (80.5 kg)   SpO2 98%   BMI 26.99 kg/m  , BMI Body mass index is 26.99 kg/m.  GEN: Well nourished, well developed, in no acute distress  HEENT: normal  Neck: no JVD, carotid bruits, or masses Cardiac: RRR; no murmurs, rubs, or gallops,no edema  Respiratory:  clear to auscultation bilaterally, normal work of breathing GI: soft, nontender, nondistended, + BS MS: no deformity or atrophy  Skin: warm and dry, no rash Neuro:  Strength and sensation are intact Psych: euthymic mood, full affect   Recent  Labs: 06/05/2019: ALT 19; BUN 19; Creatinine, Ser 0.70; Hemoglobin 14.7; Platelets 226; Potassium 3.8; Sodium 138    Lipid Panel Lab Results  Component Value Date   CHOL 139 07/03/2019   HDL 59 07/03/2019   LDLCALC 60 07/03/2019   TRIG 121 07/03/2019      Wt Readings from Last 3 Encounters:  09/17/19 177 lb 8 oz (80.5 kg)  07/13/19 178 lb (80.7 kg)  07/09/19 177 lb (80.3 kg)       ASSESSMENT AND PLAN:  Hyperlipidemia Cholesterol is at goal on the current lipid regimen. No changes to the medications were made.  Stable  History of tobacco use CT scan chest pulled up and reviewed with her, mild aortic atherosclerosis, minimal coronary calcification Continue aggressive lipid management  Controlled type 2 diabetes mellitus with other circulatory complication, without long-term current use of insulin ([place]) She is exercising on a regular basis, A1c well controlled  Aortic atherosclerosis ([place]) Images from CT pulled up  and discussed Cholesterol at goal   Total encounter time more than 25 minutes  Greater than 50% was spent in counseling and coordination of care with the patient   Disposition:   F/U 12 months   Orders Placed This Encounter  Procedures  . EKG 12-Lead     Signed, Esmond Plants, M.D., Ph.D. 09/17/2019  [place], [place]

## 2019-09-17 ENCOUNTER — Encounter: Payer: Self-pay | Admitting: Cardiovascular Disease

## 2019-09-17 ENCOUNTER — Other Ambulatory Visit: Payer: Self-pay

## 2019-09-17 ENCOUNTER — Ambulatory Visit (INDEPENDENT_AMBULATORY_CARE_PROVIDER_SITE_OTHER): Payer: Medicare Other | Admitting: Cardiovascular Disease

## 2019-09-17 VITALS — BP 130/90 | HR 71 | Ht 68.0 in | Wt 177.5 lb

## 2019-09-17 DIAGNOSIS — R0789 Other chest pain: Secondary | ICD-10-CM

## 2019-09-17 DIAGNOSIS — I10 Essential (primary) hypertension: Secondary | ICD-10-CM | POA: Diagnosis not present

## 2019-09-17 DIAGNOSIS — E119 Type 2 diabetes mellitus without complications: Secondary | ICD-10-CM

## 2019-09-17 DIAGNOSIS — E782 Mixed hyperlipidemia: Secondary | ICD-10-CM | POA: Diagnosis not present

## 2019-09-17 NOTE — Patient Instructions (Signed)

## 2019-10-02 ENCOUNTER — Other Ambulatory Visit: Payer: Self-pay

## 2019-10-02 ENCOUNTER — Ambulatory Visit (INDEPENDENT_AMBULATORY_CARE_PROVIDER_SITE_OTHER): Payer: Medicare Other | Admitting: Family Medicine

## 2019-10-02 ENCOUNTER — Encounter: Payer: Self-pay | Admitting: Family Medicine

## 2019-10-02 DIAGNOSIS — J432 Centrilobular emphysema: Secondary | ICD-10-CM | POA: Diagnosis not present

## 2019-10-02 DIAGNOSIS — Z604 Social exclusion and rejection: Secondary | ICD-10-CM

## 2019-10-02 DIAGNOSIS — E782 Mixed hyperlipidemia: Secondary | ICD-10-CM | POA: Diagnosis not present

## 2019-10-02 DIAGNOSIS — I10 Essential (primary) hypertension: Secondary | ICD-10-CM | POA: Diagnosis not present

## 2019-10-02 DIAGNOSIS — K219 Gastro-esophageal reflux disease without esophagitis: Secondary | ICD-10-CM

## 2019-10-02 DIAGNOSIS — M85852 Other specified disorders of bone density and structure, left thigh: Secondary | ICD-10-CM

## 2019-10-02 DIAGNOSIS — I700 Atherosclerosis of aorta: Secondary | ICD-10-CM

## 2019-10-02 DIAGNOSIS — E119 Type 2 diabetes mellitus without complications: Secondary | ICD-10-CM

## 2019-10-02 NOTE — Progress Notes (Signed)
Name: Felicia Cisneros   MRN: [id number]    DOB: 03-30-1951   Date:10/02/2019       Progress Note  Subjective  Chief Complaint  Chief Complaint  Patient presents with  . Diabetes    4 month follow up BS 118  . Hyperlipidemia  . Hypertension    I connected with  [name]  on 10/02/19 at  8:40 AM EST by a video enabled telemedicine application and verified that I am speaking with the correct person using two identifiers.  I discussed the limitations of evaluation and management by telemedicine and the availability of in person appointments. The patient expressed understanding and agreed to proceed. Staff also discussed with the patient that there may be a patient responsible charge related to this service. Patient Location: Home Provider Location: Office Additional Individuals present: None  HPI  Hypertension: rx lisinopril 5mg  daily; does not check BP's at home.  Denies chest pain, shortness of breath, palpitations.  She reports dependent edema has been well controlled; wear compression stockings PRN.    Diabetes mellitus type 2 Checking sugars?  yes How often? Every 2-3 days Range (low to high) over last two weeks:  FBS today was 118 - which she states is high for her because she had 2 sandwiches last night.  Average over the last month is about 110.  Trying to limit white bread, white rice, white potatoes, sweets?  yes Trying to limit sweetened drinks like iced tea, soft drinks, sports drinks, fruit juices?  yes Checking feet every day/night?  She does check daily now. Last eye exam:  She had recent exam 1 week ago [facility], just need records from her.  Denies: Polyuria, polydipsia, polyphagia, vision changes, or neuropathy. Most recent A1C: Has been well controlled for 2 years now. Lab Results  Component Value Date   HGBA1C 5.7 (H) 07/03/2019  Last CMP Results : is not due for repeat today Urine Micro UTD? Yes Current Medication Management: Diabetic  Medications: Metformin 500mg  XR once daily. ACEI/ARB: Yes Statin: Yes Aspirin therapy: Yes  Emphysema Former smoker- quit 9 years ago, with 35 pack-year history. Gets annual CT lung screening - had done in September 2020. Denies shortness of breath or coughing.Walking regularly.  Hyperlipidemia/Aortic Atherosclerosis rx simvastatin 40mg  and 81mg  ASA daily, no missed doses.  Denies chest pain, shortness of breath, or myalgais. Last lipids were at goal.  Seeing Dr. Rockey Situ - last visit 09/17/2019.  GERD rx famotidine twice daily, states works well for her overall, but does take tums PRN as well.. Occasionally gets triggered reflux if she eats supper too late, or has something acidic, but doesn't do that often. Denies difficulty swallowing, abdominal pain, or blood in stool.   Osteopenia: Taking vitamin D supplement in her centrum silver tablets only. She has never had issues with Vitamin D - does drink milk daily.  She is not able to take her exercise class right now, but is walking daily.  Bone density 08/14/2019 - osteopenia is stable.  Social Isolation: She is living alone, is a widow, and is trying to stay quarantined for the pandemic.  She does walk with a friend some days.   She has had a few tough days where she feels lonely, but is trying to keep in contact with family and friends.  Patient Active Problem List   Diagnosis Date Noted  . GERD without esophagitis 03/06/2019  . Benign neoplasm of sigmoid colon   . Lower extremity edema  01/31/2018  . Need for vaccination with 13-polyvalent pneumococcal conjugate vaccine 06/09/2017  . Centrilobular emphysema ([place]) 12/02/2016  . Pulmonary nodules/lesions, multiple 12/02/2016  . Aortic atherosclerosis ([place]) 07/08/2016  . Osteopenia 05/27/2016  . Abnormality of nail tissue 11/27/2015  . Herpes simplex antibody positive 08/22/2015  . Essential hypertension   . Type 2 diabetes mellitus, controlled ([place])   . Atrophic vaginitis   .  History of tobacco use   . Abnormal EKG 07/13/2013  . Hyperlipidemia 07/13/2013    Past Surgical History:  Procedure Laterality Date  . colonoscopy    . COLONOSCOPY WITH PROPOFOL N/A 02/20/2018   Procedure: COLONOSCOPY WITH PROPOFOL;  Surgeon: Lucilla Lame, MD;  Location: [facility];  Service: Endoscopy;  Laterality: N/A;  Diabetic  . CYSTOSCOPY WITH BIOPSY N/A 01/17/2017   Procedure: CYSTOSCOPY WITH BIOPSY;  Surgeon: Hollice Espy, MD;  Location: [facility];  Service: Urology;  Laterality: N/A;  . POLYPECTOMY  02/20/2018   Procedure: POLYPECTOMY;  Surgeon: Lucilla Lame, MD;  Location: [facility];  Service: Endoscopy;;  . TMJ ARTHROPLASTY  1984    Family History  Problem Relation Age of Onset  . Stroke Mother   . Hypertension Mother   . Hyperlipidemia Mother   . Diabetes Mother        pre-diabetic  . Diabetes Brother   . Irregular heart beat Brother   . Thyroid disease Sister   . Stroke Maternal Grandmother   . Cancer Maternal Grandfather        bone  . Cancer Sister        cystic fibroid carcinoma  . Ulcerative colitis Sister   . Breast cancer Neg Hx   . Hematuria Neg Hx   . Prostate cancer Neg Hx   . Renal cancer Neg Hx     Social History   Socioeconomic History  . Marital status: Widowed    Spouse name: Charlyne Mom  . Number of children: 0  . Years of education: some college  . Highest education level: 12th grade  Occupational History    Employer: [employer]  Social Needs  . Financial resource strain: Not hard at all  . Food insecurity    Worry: Never true    Inability: Never true  . Transportation needs    Medical: No    Non-medical: No  Tobacco Use  . Smoking status: Former Smoker    Packs/day: 3.00    Years: 45.00    Pack years: 135.00    Types: Cigarettes    Quit date: 10/25/2009    Years since quitting: 9.9  . Smokeless tobacco: Never Used  . Tobacco comment: smoking cessation materials not required  Substance and  Sexual Activity  . Alcohol use: Yes    Alcohol/week: 0.0 standard drinks    Comment: Rarely  . Drug use: No  . Sexual activity: Not Currently  Lifestyle  . Physical activity    Days per week: 0 days    Minutes per session: 0 min  . Stress: Only a little  Relationships  . Social Herbalist on phone: Patient refused    Gets together: Patient refused    Attends religious service: Patient refused    Active member of club or organization: Patient refused    Attends meetings of clubs or organizations: Patient refused    Relationship status: Widowed  . Intimate partner violence    Fear of current or ex partner: No    Emotionally abused: No  Physically abused: No    Forced sexual activity: No  Other Topics Concern  . Not on file  Social History Narrative  . Not on file     Current Outpatient Medications:  .  acetaminophen (TYLENOL) 500 MG tablet, Take 1,000 mg by mouth every 6 (six) hours as needed (for pain.)., Disp: , Rfl:  .  aspirin EC 81 MG tablet, Take 81 mg by mouth at bedtime., Disp: , Rfl:  .  BIOTIN PO, Take by mouth daily., Disp: , Rfl:  .  CINNAMON PO, Take 1,000 mg by mouth 2 (two) times daily., Disp: , Rfl:  .  clotrimazole-betamethasone (LOTRISONE) cream, Apply externally BID for 2 wks, Disp: 15 g, Rfl: 0 .  Coenzyme Q10 (COQ10) 100 MG CAPS, One by mouth daily, Disp: , Rfl:  .  famotidine (PEPCID) 20 MG tablet, TAKE 1 TABLET BY MOUTH  TWICE DAILY, Disp: 180 tablet, Rfl: 0 .  glucose blood (ONETOUCH VERIO) test strip, CHECK FINGERSTICK BLOOD SUGARS THREE TIMES A WEEK, Disp: 100 each, Rfl: 3 .  Lancets (ACCU-CHEK SOFT TOUCH) lancets, Check fingerstick blood sugars three times a week; Dx: E11.9, LON:99 months, Disp: 100 each, Rfl: 1 .  lisinopril (ZESTRIL) 5 MG tablet, Take 1 tablet (5 mg total) by mouth daily. for blood pressure, Disp: 90 tablet, Rfl: 1 .  LYSINE PO, Take by mouth as needed., Disp: , Rfl:  .  Melatonin 2.5 MG CHEW, Chew 2.5 mg by mouth  daily as needed., Disp: , Rfl:  .  metFORMIN (GLUCOPHAGE-XR) 500 MG 24 hr tablet, Take 1 tablet (500 mg total) by mouth daily with breakfast., Disp: 90 tablet, Rfl: 1 .  Multiple Vitamins-Minerals (CENTRUM SILVER 50+WOMEN) TABS, Take 1 tablet by mouth daily., Disp: , Rfl:  .  OVER THE COUNTER MEDICATION, neuriva plus OTC supplement (like prevagen), Disp: , Rfl:  .  PREVIDENT 5000 PLUS 1.1 % CREA dental cream, Place 1 application onto teeth daily as needed. , Disp: , Rfl:  .  Probiotic Product (TRUBIOTICS) CAPS, Take 1 capsule by mouth daily., Disp: , Rfl:  .  simvastatin (ZOCOR) 40 MG tablet, Take 1 tablet (40 mg total) by mouth at bedtime., Disp: 90 tablet, Rfl: 3  No Known Allergies  I personally reviewed active problem list, medication list, allergies, health maintenance, notes from last encounter, lab results with the patient/caregiver today.   ROS  Constitutional: Negative for fever or weight change.  Respiratory: Negative for cough and shortness of breath.   Cardiovascular: Negative for chest pain or palpitations.  Gastrointestinal: Negative for abdominal pain, no bowel changes.  Musculoskeletal: Negative for gait problem or joint swelling.  Skin: Negative for rash.  Neurological: Negative for dizziness or headache.  No other specific complaints in a complete review of systems (except as listed in HPI above).  Objective  Virtual encounter, vitals not obtained.  There is no height or weight on file to calculate BMI.  Physical Exam  Constitutional: Patient appears well-developed and well-nourished. No distress.  HENT: Head: Normocephalic and atraumatic.  Neck: Normal range of motion. Pulmonary/Chest: Effort normal. No respiratory distress. Speaking in complete sentences Neurological: Pt is alert and oriented to person, place, and time. Coordination, speech are normal.  Psychiatric: Patient has a normal mood and affect. behavior is normal. Judgment and thought content  normal.  No results found for this or any previous visit (from the past 72 hour(s)).  PHQ2/9: Depression screen [facility] 2/9 10/02/2019 07/03/2019 03/22/2019 03/06/2019 09/05/2018  Decreased Interest 0 0  0 0 0  Down, Depressed, Hopeless 0 0 0 0 0  PHQ - 2 Score 0 0 0 0 0  Altered sleeping 0 - 0 0 0  Tired, decreased energy 0 - 0 0 0  Change in appetite 0 - 0 0 0  Feeling bad or failure about yourself  0 - 0 0 0  Trouble concentrating 0 - 0 0 0  Moving slowly or fidgety/restless 0 - 0 0 0  Suicidal thoughts 0 - 0 0 0  PHQ-9 Score 0 - 0 0 0  Difficult doing work/chores Not difficult at all - Not difficult at all Not difficult at all Not difficult at all  Some recent data might be hidden   PHQ-2/9 Result is negative.    Fall Risk: Fall Risk  10/02/2019 07/03/2019 03/22/2019 03/06/2019 09/05/2018  Falls in the past year? 0 0 1 1 0  Comment - - - - -  Number falls in past yr: 0 0 0 0 0  Injury with Fall? 0 0 0 0 -  Risk for fall due to : - - - - -  Risk for fall due to: Comment - - - - -  Follow up Falls evaluation completed Falls prevention discussed Falls evaluation completed - -    Assessment & Plan  1. Essential hypertension - Stable on current regimen with Low dose lisinopril.  2. Controlled type 2 diabetes mellitus without complication, without long-term current use of insulin ([facility]) - Metformin 500mg  once daily and diet controlled.   3. Centrilobular emphysema ([facility]) - Stable without daily inhaler.  4. Mixed hyperlipidemia - Tolerating statin therapy.   5. Aortic atherosclerosis ([place]) - Seeing Cardiology  6. GERD without esophagitis - Occasional flares - taking tums for this; pepcid BID  7. Osteopenia of neck of left femur - Cotninue high calcium in diet and vitamin D supplement  8. Social isolation - Discussed at length; pandemic has been hard on her.  Does not feel that she needs medication at this time, just has a few down/frustrating days here and there.  Keeping up with  friends and family via telephone and video chat.   I discussed the assessment and treatment plan with the patient. The patient was provided an opportunity to ask questions and all were answered. The patient agreed with the plan and demonstrated an understanding of the instructions.  The patient was advised to call back or seek an in-person evaluation if the symptoms worsen or if the condition fails to improve as anticipated.  I provided 23 minutes of non-face-to-face time during this encounter.

## 2019-10-04 ENCOUNTER — Encounter: Payer: Self-pay | Admitting: Family Medicine

## 2019-10-04 DIAGNOSIS — R0981 Nasal congestion: Secondary | ICD-10-CM

## 2019-10-04 LAB — THYROID PANEL WITH TSH
Free Thyroxine Index: 1.9 (ref 1.4–3.8)
T3 Uptake: 31 % (ref 22–35)
T4, Total: 6.1 ug/dL (ref 5.1–11.9)
TSH: 2.99 mIU/L (ref 0.40–4.50)

## 2019-10-08 ENCOUNTER — Encounter: Payer: Self-pay | Admitting: Family Medicine

## 2019-10-08 MED ORDER — PSEUDOEPHEDRINE HCL 30 MG PO TABS: 30.0000 mg | ORAL_TABLET | Freq: Three times a day (TID) | ORAL | 0 refills | Status: DC | PRN

## 2019-10-08 MED ORDER — CIMETIDINE 200 MG PO TABS: 200.0000 mg | ORAL_TABLET | Freq: Two times a day (BID) | ORAL | 1 refills | Status: DC

## 2019-10-08 NOTE — Addendum Note (Signed)
Addended by: Raelyn Ensign E on: 10/08/2019 03:00 PM   Modules accepted: Orders

## 2019-10-13 ENCOUNTER — Encounter: Payer: Self-pay | Admitting: Family Medicine

## 2019-10-14 ENCOUNTER — Encounter: Payer: Self-pay | Admitting: Family Medicine

## 2019-10-14 DIAGNOSIS — Z20822 Contact with and (suspected) exposure to covid-19: Secondary | ICD-10-CM

## 2019-10-16 ENCOUNTER — Ambulatory Visit: Payer: Medicare Other | Attending: Internal Medicine

## 2019-10-16 DIAGNOSIS — Z20822 Contact with and (suspected) exposure to covid-19: Secondary | ICD-10-CM

## 2019-10-18 LAB — NOVEL CORONAVIRUS, NAA: SARS-CoV-2, NAA: NOT DETECTED

## 2019-11-06 ENCOUNTER — Encounter: Payer: Self-pay | Admitting: Family Medicine

## 2019-11-09 ENCOUNTER — Encounter: Payer: Self-pay | Admitting: Family Medicine

## 2019-11-09 DIAGNOSIS — N761 Subacute and chronic vaginitis: Secondary | ICD-10-CM

## 2019-11-09 MED ORDER — CLOTRIMAZOLE-BETAMETHASONE 1-0.05 % EX CREA: TOPICAL_CREAM | CUTANEOUS | 1 refills | Status: DC

## 2019-11-12 ENCOUNTER — Other Ambulatory Visit: Payer: Self-pay

## 2019-11-12 DIAGNOSIS — N761 Subacute and chronic vaginitis: Secondary | ICD-10-CM

## 2019-11-12 MED ORDER — ONETOUCH DELICA LANCETS 30G MISC: 1.0000 "application " | 5 refills | Status: DC

## 2019-11-12 MED ORDER — CLOTRIMAZOLE-BETAMETHASONE 1-0.05 % EX CREA: TOPICAL_CREAM | CUTANEOUS | 1 refills | Status: DC

## 2019-11-13 ENCOUNTER — Encounter: Payer: Self-pay | Admitting: Family Medicine

## 2019-11-19 ENCOUNTER — Other Ambulatory Visit: Payer: Self-pay

## 2019-11-19 DIAGNOSIS — E119 Type 2 diabetes mellitus without complications: Secondary | ICD-10-CM

## 2019-11-19 MED ORDER — ONETOUCH VERIO W/DEVICE KIT: 1.0000 | PACK | Freq: Every day | 0 refills | Status: DC

## 2019-11-22 ENCOUNTER — Encounter: Payer: Self-pay | Admitting: Family Medicine

## 2019-11-23 ENCOUNTER — Encounter: Payer: Self-pay | Admitting: Family Medicine

## 2019-12-16 ENCOUNTER — Ambulatory Visit: Payer: Medicare Other | Attending: Internal Medicine

## 2019-12-16 ENCOUNTER — Encounter: Payer: Self-pay | Admitting: Family Medicine

## 2019-12-16 DIAGNOSIS — Z23 Encounter for immunization: Secondary | ICD-10-CM | POA: Insufficient documentation

## 2019-12-16 NOTE — Progress Notes (Signed)
   Covid-19 Vaccination Clinic  Name:  Felicia Cisneros    MRN: [id number] DOB: 1951-03-28  12/16/2019  Felicia Cisneros was observed post Covid-19 immunization for 15 minutes without incidence. She was provided with Vaccine Information Sheet and instruction to access the V-Safe system.   Felicia Cisneros was instructed to call 911 with any severe reactions post vaccine: Marland Kitchen Difficulty breathing  . Swelling of your face and throat  . A fast heartbeat  . A bad rash all over your body  . Dizziness and weakness    Immunizations Administered    Name Date Dose VIS Date Route   Pfizer COVID-19 Vaccine 12/16/2019  1:54 PM 0.3 mL 10/05/2019 Intramuscular   Manufacturer: [place]   Lot: [id number]   [place]: [id number]

## 2019-12-17 ENCOUNTER — Encounter: Payer: Self-pay | Admitting: Family Medicine

## 2019-12-18 ENCOUNTER — Telehealth: Payer: Self-pay

## 2019-12-18 MED ORDER — FAMOTIDINE 20 MG PO TABS: 20.0000 mg | ORAL_TABLET | Freq: Two times a day (BID) | ORAL | 3 refills | Status: DC

## 2019-12-18 NOTE — Telephone Encounter (Signed)
Refill sent in

## 2019-12-18 NOTE — Telephone Encounter (Signed)
Can tell pt this info from me  Use of coenzyme Q10 for prevention -- Some clinicians recommend that patients taking statins take Coenzyme Q10 (CoQ10) to try to prevent myopathy, and a few case reports have noted benefit with doses of 30 to 250 mg/day. Further, a meta-analysis of several small clinical trials suggests benefit. Nonetheless, we feel that there is inadequate evidence to recommend CoQ10 supplementation for prevention of statin-induced muscle toxicity based on a randomized, double-blind placebo-controlled crossover trial   Not medically recommended or proven (per studies and evidence based medicine) but often done by clinicians.  I don't have any medical recommendations regarding it - if it helps her feel good while taking statin she can keep taking it.

## 2020-01-08 ENCOUNTER — Ambulatory Visit: Payer: Medicare Other | Attending: Internal Medicine

## 2020-01-08 DIAGNOSIS — Z23 Encounter for immunization: Secondary | ICD-10-CM

## 2020-01-08 NOTE — Progress Notes (Signed)
   Covid-19 Vaccination Clinic  Name:  Felicia Cisneros    MRN: [id number] DOB: [birth year]  01/08/2020  Ms. Lips was observed post Covid-19 immunization for 15 minutes without incident. She was provided with Vaccine Information Sheet and instruction to access the V-Safe system.   [name] was instructed to call 911 with any severe reactions post vaccine: Marland Kitchen Difficulty breathing  . Swelling of face and throat  . A fast heartbeat  . A bad rash all over body  . Dizziness and weakness   Immunizations Administered    Name Date Dose VIS Date Route   Pfizer COVID-19 Vaccine 01/08/2020 10:31 AM 0.3 mL 10/05/2019 Intramuscular   Manufacturer: [place]   Lot: [id number]   [place]: [id number]

## 2020-02-01 ENCOUNTER — Ambulatory Visit: Payer: Medicare Other | Admitting: Family Medicine

## 2020-02-14 ENCOUNTER — Other Ambulatory Visit: Payer: Self-pay

## 2020-02-14 ENCOUNTER — Encounter: Payer: Self-pay | Admitting: Family Medicine

## 2020-02-15 ENCOUNTER — Ambulatory Visit (INDEPENDENT_AMBULATORY_CARE_PROVIDER_SITE_OTHER): Payer: Medicare Other | Admitting: Family Medicine

## 2020-02-15 ENCOUNTER — Encounter: Payer: Self-pay | Admitting: Family Medicine

## 2020-02-15 ENCOUNTER — Other Ambulatory Visit: Payer: Self-pay

## 2020-02-15 VITALS — BP 126/82 | HR 85 | Temp 97.8°F | Resp 14 | Ht 68.0 in | Wt 178.2 lb

## 2020-02-15 DIAGNOSIS — I700 Atherosclerosis of aorta: Secondary | ICD-10-CM

## 2020-02-15 DIAGNOSIS — J432 Centrilobular emphysema: Secondary | ICD-10-CM

## 2020-02-15 DIAGNOSIS — R918 Other nonspecific abnormal finding of lung field: Secondary | ICD-10-CM | POA: Diagnosis not present

## 2020-02-15 DIAGNOSIS — Z5181 Encounter for therapeutic drug level monitoring: Secondary | ICD-10-CM

## 2020-02-15 DIAGNOSIS — N952 Postmenopausal atrophic vaginitis: Secondary | ICD-10-CM

## 2020-02-15 DIAGNOSIS — M85852 Other specified disorders of bone density and structure, left thigh: Secondary | ICD-10-CM

## 2020-02-15 DIAGNOSIS — E1159 Type 2 diabetes mellitus with other circulatory complications: Secondary | ICD-10-CM | POA: Diagnosis not present

## 2020-02-15 DIAGNOSIS — I10 Essential (primary) hypertension: Secondary | ICD-10-CM

## 2020-02-15 DIAGNOSIS — E782 Mixed hyperlipidemia: Secondary | ICD-10-CM

## 2020-02-15 DIAGNOSIS — K219 Gastro-esophageal reflux disease without esophagitis: Secondary | ICD-10-CM

## 2020-02-15 MED ORDER — METFORMIN HCL ER 500 MG PO TB24: 500.0000 mg | ORAL_TABLET | Freq: Every day | ORAL | 3 refills | Status: DC

## 2020-02-15 MED ORDER — LISINOPRIL 5 MG PO TABS: 5.0000 mg | ORAL_TABLET | Freq: Every day | ORAL | 3 refills | Status: DC

## 2020-02-15 NOTE — Progress Notes (Signed)
Name: Felicia Cisneros   MRN: [id number]    DOB: August 15, 1951   Date:02/15/2020       Progress Note  Chief Complaint  Patient presents with  . Follow-up  . Diabetes  . Hypertension  . Hyperlipidemia  . Rash    yeast in groin area     Subjective:   Felicia Cisneros is a 69 y.o. female, presents to clinic for routine follow up on the conditions listed above.  PT new to me, goes to cardiology Dr. Rockey Situ once a year, does her eye exam once a year, done 05/31/2019  Diabetes Mellitus Type II: Currently managing with metformin XR 500 mg once daily Pt notes good med compliance Pt has no SE from meds. Fasting CBGs typically run 109 Recent high CBG 134 and low CBG 99.   No hypoglycemic episodes Denies: Polyuria, polydipsia, polyphagia, vision changes, or neuropathy  Recent pertinent labs: Lab Results  Component Value Date   HGBA1C 5.7 (H) 07/03/2019   HGBA1C 5.8 (H) 09/05/2018   HGBA1C 5.9 (H) 02/27/2018    Pt is due for DM foot exam and UTD eye exam ACEI/ARB: Yes Statin: Yes  HLD - simvastatin takes 40 mg at bedtime No SE or myalgias Lab Results  Component Value Date   CHOL 139 07/03/2019   HDL 59 07/03/2019   LDLCALC 60 07/03/2019   TRIG 121 07/03/2019   CHOLHDL 2.4 07/03/2019   HTN - on lisinopril 5 mg takes daily at bedtime Blood pressures well controlled today 126/82.  She denies any side effects from her blood pressure medication she denies any chest pain, shortness of breath, lower extremity edema, palpitations, orthopnea, PND, headaches or visual disturbances.       Patient Active Problem List   Diagnosis Date Noted  . GERD without esophagitis 03/06/2019  . Benign neoplasm of sigmoid colon   . Lower extremity edema 01/31/2018  . Need for vaccination with 13-polyvalent pneumococcal conjugate vaccine 06/09/2017  . Centrilobular emphysema ([place]) 12/02/2016  . Pulmonary nodules/lesions, multiple 12/02/2016  . Aortic atherosclerosis ([place]) 07/08/2016  .  Osteopenia 05/27/2016  . Abnormality of nail tissue 11/27/2015  . Herpes simplex antibody positive 08/22/2015  . Essential hypertension   . Type 2 diabetes mellitus, controlled ([place])   . Atrophic vaginitis   . History of tobacco use   . Abnormal EKG 07/13/2013  . Hyperlipidemia 07/13/2013    Past Surgical History:  Procedure Laterality Date  . colonoscopy    . COLONOSCOPY WITH PROPOFOL N/A 02/20/2018   Procedure: COLONOSCOPY WITH PROPOFOL;  Surgeon: Lucilla Lame, MD;  Location: [facility];  Service: Endoscopy;  Laterality: N/A;  Diabetic  . CYSTOSCOPY WITH BIOPSY N/A 01/17/2017   Procedure: CYSTOSCOPY WITH BIOPSY;  Surgeon: Hollice Espy, MD;  Location: [facility];  Service: Urology;  Laterality: N/A;  . POLYPECTOMY  02/20/2018   Procedure: POLYPECTOMY;  Surgeon: Lucilla Lame, MD;  Location: [facility];  Service: Endoscopy;;  . TMJ ARTHROPLASTY  1984    Family History  Problem Relation Age of Onset  . Stroke Mother   . Hypertension Mother   . Hyperlipidemia Mother   . Diabetes Mother        pre-diabetic  . Diabetes Brother   . Irregular heart beat Brother   . Thyroid disease Sister   . Stroke Maternal Grandmother   . Cancer Maternal Grandfather        bone  . Cancer Sister        cystic fibroid carcinoma  .  Ulcerative colitis Sister   . Breast cancer Neg Hx   . Hematuria Neg Hx   . Prostate cancer Neg Hx   . Renal cancer Neg Hx     Social History   Tobacco Use  . Smoking status: Former Smoker    Packs/day: 3.00    Years: 45.00    Pack years: 135.00    Types: Cigarettes    Quit date: 10/25/2009    Years since quitting: 10.3  . Smokeless tobacco: Never Used  . Tobacco comment: smoking cessation materials not required  Substance Use Topics  . Alcohol use: Yes    Alcohol/week: 0.0 standard drinks    Comment: Rarely  . Drug use: No      Current Outpatient Medications:  .  acetaminophen (TYLENOL) 500 MG tablet, Take 1,000 mg by mouth every 6  (six) hours as needed (for pain.)., Disp: , Rfl:  .  aspirin EC 81 MG tablet, Take 81 mg by mouth at bedtime., Disp: , Rfl:  .  CINNAMON PO, Take 1,000 mg by mouth 2 (two) times daily., Disp: , Rfl:  .  clotrimazole-betamethasone (LOTRISONE) cream, Apply externally BID PRN, Disp: 15 g, Rfl: 1 .  Coenzyme Q10 (COQ10) 100 MG CAPS, One by mouth daily, Disp: , Rfl:  .  famotidine (PEPCID) 20 MG tablet, Take 1 tablet (20 mg total) by mouth 2 (two) times daily., Disp: 90 tablet, Rfl: 3 .  lisinopril (ZESTRIL) 5 MG tablet, Take 1 tablet (5 mg total) by mouth daily. for blood pressure, Disp: 90 tablet, Rfl: 1 .  LYSINE PO, Take by mouth as needed., Disp: , Rfl:  .  Melatonin 2.5 MG CHEW, Chew 2.5 mg by mouth daily as needed., Disp: , Rfl:  .  metFORMIN (GLUCOPHAGE-XR) 500 MG 24 hr tablet, Take 1 tablet (500 mg total) by mouth daily with breakfast., Disp: 90 tablet, Rfl: 1 .  Multiple Vitamins-Minerals (CENTRUM SILVER 50+WOMEN) TABS, Take 1 tablet by mouth daily., Disp: , Rfl:  .  OVER THE COUNTER MEDICATION, neuriva plus OTC supplement (like prevagen), Disp: , Rfl:  .  PREVIDENT 5000 PLUS 1.1 % CREA dental cream, Place 1 application onto teeth daily as needed. , Disp: , Rfl:  .  Probiotic Product (TRUBIOTICS) CAPS, Take 1 capsule by mouth daily., Disp: , Rfl:  .  pseudoephedrine (SUDAFED) 30 MG tablet, Take 1 tablet (30 mg total) by mouth every 8 (eight) hours as needed for congestion. No more than 3 tablets/month, Disp: 30 tablet, Rfl: 0 .  simvastatin (ZOCOR) 40 MG tablet, Take 1 tablet (40 mg total) by mouth at bedtime., Disp: 90 tablet, Rfl: 3 .  BIOTIN PO, Take by mouth daily., Disp: , Rfl:  .  Blood Glucose Monitoring Suppl (ONETOUCH VERIO) w/Device KIT, 1 applicator by Does not apply route daily. Check BG 1x day, LON:99, DX:E11.9, Disp: 1 kit, Rfl: 0 .  cimetidine (TAGAMET) 200 MG tablet, Take 1 tablet (200 mg total) by mouth 2 (two) times daily. (Patient not taking: Reported on 02/15/2020), Disp:  180 tablet, Rfl: 1 .  glucose blood (ONETOUCH VERIO) test strip, CHECK FINGERSTICK BLOOD SUGARS THREE TIMES A WEEK, Disp: 100 each, Rfl: 3 .  Lancets (ACCU-CHEK SOFT TOUCH) lancets, Check fingerstick blood sugars three times a week; Dx: E11.9, LON:99 months, Disp: 100 each, Rfl: 1 .  OneTouch Delica Lancets 76H MISC, 1 application by Does not apply route 3 (three) times a week. Dx:E11.9 LON:99 check BS 3x per week, Disp: 100 each, Rfl: 5  No Known Allergies  Chart Review Today: I personally reviewed active problem list, medication list, allergies, family history, social history, health maintenance, notes from last encounter, lab results, imaging with the patient/caregiver today.   Review of Systems  10 Systems reviewed and are negative for acute change except as noted in the HPI.  Objective:    Vitals:   02/15/20 0847  BP: 126/82  Pulse: 85  Resp: 14  Temp: 97.8 F (36.6 C)  SpO2: 98%  Weight: 178 lb 3.2 oz (80.8 kg)  Height: _0  (1.727 m)    Body mass index is 27.1 kg/m.  Physical Exam Vitals and nursing note reviewed.  Constitutional:      General: She is not in acute distress.    Appearance: Normal appearance. She is well-developed. She is not ill-appearing, toxic-appearing or diaphoretic.     Interventions: Face mask in place.  HENT:     Head: Normocephalic and atraumatic.     Right Ear: External ear normal.     Left Ear: External ear normal.  Eyes:     General: Lids are normal. No scleral icterus.       Right eye: No discharge.        Left eye: No discharge.     Conjunctiva/sclera: Conjunctivae normal.  Neck:     Trachea: Phonation normal. No tracheal deviation.  Cardiovascular:     Rate and Rhythm: Normal rate and regular rhythm.     Pulses: Normal pulses.          Radial pulses are 2+ on the right side and 2+ on the left side.       Posterior tibial pulses are 2+ on the right side and 2+ on the left side.     Heart sounds: Normal heart sounds. No murmur.  No friction rub. No gallop.   Pulmonary:     Effort: Pulmonary effort is normal. No respiratory distress.     Breath sounds: Normal breath sounds. No stridor. No wheezing, rhonchi or rales.  Chest:     Chest wall: No tenderness.  Abdominal:     General: Bowel sounds are normal. There is no distension.     Palpations: Abdomen is soft.     Tenderness: There is no abdominal tenderness. There is no guarding or rebound.  Musculoskeletal:        General: No deformity.     Cervical back: Normal range of motion and neck supple.     Right lower leg: No edema.     Left lower leg: No edema.  Lymphadenopathy:     Cervical: No cervical adenopathy.  Skin:    General: Skin is warm and dry.     Capillary Refill: Capillary refill takes less than 2 seconds.     Coloration: Skin is not jaundiced or pale.     Findings: No rash.  Neurological:     Mental Status: She is alert and oriented to person, place, and time.     Motor: No abnormal muscle tone.     Coordination: Coordination normal.     Gait: Gait normal.  Psychiatric:        Mood and Affect: Mood normal.        Speech: Speech normal.        Behavior: Behavior normal.       Diabetic Foot Exam: Diabetic Foot Exam - Simple   Simple Foot Form Diabetic Foot exam was performed with the following findings: Yes 02/15/2020  9:05 AM  Visual  Inspection Sensation Testing Pulse Check Comments     PHQ2/9: Depression screen [facility] 2/9 02/15/2020 10/02/2019 07/03/2019 03/22/2019 03/06/2019  Decreased Interest 0 0 0 0 0  Down, Depressed, Hopeless 0 0 0 0 0  PHQ - 2 Score 0 0 0 0 0  Altered sleeping 0 0 - 0 0  Tired, decreased energy 0 0 - 0 0  Change in appetite 0 0 - 0 0  Feeling bad or failure about yourself  0 0 - 0 0  Trouble concentrating 0 0 - 0 0  Moving slowly or fidgety/restless 0 0 - 0 0  Suicidal thoughts 0 0 - 0 0  PHQ-9 Score 0 0 - 0 0  Difficult doing work/chores Not difficult at all Not difficult at all - Not difficult at all Not  difficult at all  Some recent data might be hidden    phq 9 is neg, rev  Fall Risk: Fall Risk  02/15/2020 10/02/2019 07/03/2019 03/22/2019 03/06/2019  Falls in the past year? 0 0 0 1 1  Comment - - - - -  Number falls in past yr: 0 0 0 0 0  Injury with Fall? 0 0 0 0 0  Risk for fall due to : - - - - -  Risk for fall due to: Comment - - - - -  Follow up - Falls evaluation completed Falls prevention discussed Falls evaluation completed -    Functional Status Survey: Is the patient deaf or have difficulty hearing?: No Does the patient have difficulty seeing, even when wearing glasses/contacts?: No Does the patient have difficulty concentrating, remembering, or making decisions?: No Does the patient have difficulty walking or climbing stairs?: No Does the patient have difficulty dressing or bathing?: No Does the patient have difficulty doing errands alone such as visiting a doctor's office or shopping?: No   Assessment & Plan:     ICD-10-CM   1. Controlled type 2 diabetes mellitus with other circulatory complication, without long-term current use of insulin (HCC)  E11.59 CBC with Differential/Platelet    COMPLETE METABOLIC PANEL WITH GFR    Lipid panel    Hemoglobin A1c    metFORMIN (GLUCOPHAGE-XR) 500 MG 24 hr tablet    lisinopril (ZESTRIL) 5 MG tablet   well controlled on metformin, on ACEI and Statin, no SE or concerns, recheck A1C  2. Aortic atherosclerosis (HCC)  H29.9 COMPLETE METABOLIC PANEL WITH GFR    Lipid panel  3. Centrilobular emphysema ([place])  J43.2    Patient denies any symptoms of shortness of breath she does CT lung cancer screening annually.  4. Pulmonary nodules/lesions, multiple  R91.8    doing annual monitoring  5. Osteopenia of neck of left femur  M42.683 COMPLETE METABOLIC PANEL WITH GFR   Discussed osteopenia and osteoporosis at length today encouraged her to supplement with vitamin D and calcium, follow-up DEXA scan and appointment advised  6. Atrophic  vaginitis  N95.2    Patient has vague urinary and vaginal complaints but does not want to address she has multiple prescribed steroids with her  7. Mixed hyperlipidemia  M19.6 COMPLETE METABOLIC PANEL WITH GFR    Lipid panel   on statin, no myalgias, no claudication or exertional CP or SOB   8. Essential hypertension  Q22 COMPLETE METABOLIC PANEL WITH GFR    lisinopril (ZESTRIL) 5 MG tablet   well controlled with lisinopril   9. GERD without esophagitis  K21.9    pepcid BID works well, using PRN, uses  tums occassionally   10. Encounter for medication monitoring  Z51.81 CBC with Differential/Platelet    COMPLETE METABOLIC PANEL WITH GFR    Lipid panel    Hemoglobin A1c   Patient brought in multiple prescription steroids with her that she has been using on her vulva she states that she uses occasionally and she had vague vaginal and vulvar complaints and she confirmed a history of atrophic vaginitis but did not want to do any assessment of her pelvic floor or do a pelvic exam at all.  At explained that some of her symptoms may be secondary to atrophic vaginitis which is treatable with some topical supplements but she did not want to do anything further.  I did encourage her to continue to use what works for her as long as it sparingly and is not leading to any sores, rash or thinning of skin  Return for 6 month routine f/up HTN, HLD, DM.   [name], PA-C 02/15/20 9:05 AM

## 2020-02-15 NOTE — Patient Instructions (Signed)
Health Maintenance  Topic Date Due  . FOOT EXAM  09/06/2019  . HEMOGLOBIN A1C  12/31/2019  . INFLUENZA VACCINE  05/25/2020  . OPHTHALMOLOGY EXAM  05/30/2020  . MAMMOGRAM  08/13/2020  . PNA vac Low Risk Adult (2 of 2 - PPSV23) 11/26/2020  . DEXA SCAN  08/13/2021  . COLONOSCOPY  02/21/2028  . TETANUS/TDAP  07/10/2029  . COVID-19 Vaccine  Completed  . Hepatitis C Screening  Completed      Preventing Osteoporosis, Adult Osteoporosis is a condition that causes the bones to lose density. This means that the bones become thinner, and the normal spaces in bone tissue become larger. Low bone density can make the bones weak and cause them to break more easily. Osteoporosis cannot always be prevented, but you can take steps to lower your risk of developing this condition. How can this condition affect me? If you develop osteoporosis, you will be more likely to break bones in your wrist, spine, or hip. Even a minor accident or injury can be enough to break weak bones. The bones will also be slower to heal. Osteoporosis can cause other problems as well, such as a stooped posture or trouble with movement. Osteoporosis can occur with aging. As you get older, you may lose bone tissue more quickly, or it may be replaced more slowly. Osteoporosis is more likely to develop if you have poor nutrition or do not get enough calcium or vitamin D. Other lifestyle factors can also play a role. By eating a well-balanced diet and making lifestyle changes, you can help keep your bones strong and healthy, lowering your chances of developing osteoporosis. What can increase my risk? The following factors may make you more likely to develop osteoporosis:  Having a family history of the condition.  Having poor nutrition or not getting enough calcium or vitamin D.  Using certain medicines, such as steroid medicines or antiseizure medicines.  Being any of the following: ? 21 years of age or older. ? Female. ? A  woman who has gone through menopause (is postmenopausal). ? White (Caucasian) or of Asian descent.  Smoking or having a history of smoking.  Not being physically active (being sedentary).  Having a small body frame. What actions can I take to prevent this?  Get enough calcium   Make sure you get enough calcium every day. Calcium is the most important mineral for bone health. Most people can get enough calcium from their diet, but supplements may be recommended for people who are at risk for osteoporosis. Follow these guidelines: ? If you are age 45 or younger, aim to get 1,000 mg of calcium every day. ? If you are older than age 90, aim to get 1,200 mg of calcium every day.  Good sources of calcium include: ? Dairy products, such as low-fat or nonfat milk, cheese, and yogurt. ? Dark green leafy vegetables, such as bok choy and broccoli. ? Foods that have had calcium added to them (calcium-fortified foods), such as orange juice, cereal, bread, soy beverages, and tofu products. ? Nuts, such as almonds.  Check nutrition labels to see how much calcium is in a food or drink. Get enough vitamin D  Try to get enough vitamin D every day. Vitamin D is the most essential vitamin for bone health. It helps the body absorb calcium. Follow these guidelines for how much vitamin D to get from food: ? If you are age 16 or younger, aim to get at least 600 international units (  IU) every day. Your health care provider may suggest more. ? If you are older than age 56, aim to get at least 800 international units every day. Your health care provider may suggest more.  Good sources of vitamin D in your diet include: ? Egg yolks. ? Oily fish, such as salmon, sardines, and tuna. ? Milk and cereal fortified with vitamin D.  Your body also makes vitamin D when you are out in the sun. Exposing the bare skin on your face, arms, legs, or back to the sun for no more than 30 minutes a day, 2 times a week is more  than enough. Beyond that, make sure you use sunblock to protect your skin from sunburn, which increases your risk for skin cancer. Exercise  Stay active and get exercise every day.  Ask your health care provider what types of exercise are best for you. Weight-bearing and strength-building activities are important for building and maintaining healthy bones. Some examples of these types of activities include: ? Walking and hiking. ? Jogging and running. ? Dancing. ? Gym exercises. ? Lifting weights. ? Tennis and racquetball. ? Climbing stairs. ? Aerobics. Make other lifestyle changes  Do not use any products that contain nicotine or tobacco, such as cigarettes, e-cigarettes, and chewing tobacco. If you need help quitting, ask your health care provider.  Lose weight if you are overweight.  If you drink alcohol: ? Limit how much you use to:  0-1 drink a day for nonpregnant women.  0-2 drinks a day for men. ? Be aware of how much alcohol is in your drink. In the [place] one drink equals one 12 oz bottle of beer (355 mL), one 5 oz glass of wine (148 mL), or one 1 oz glass of hard liquor (44 mL). Where to find support If you need help making changes to prevent osteoporosis, talk with your health care provider. You can ask for a referral to a diet and nutrition specialist (dietitian) and a physical therapist. Where to find more information Learn more about osteoporosis from:  NIH Osteoporosis and Related [place]: www.bones.SouthExposed.es  [place].S. Office on [employer]: [place]s.tn  [place]: EquipmentWeekly.com.ee Summary  Osteoporosis is a condition that causes weak bones that are more likely to break.  Eat a healthy diet, making sure you get enough calcium and vitamin D, and stay active by getting regular exercise to help prevent osteoporosis.  Other ways to reduce your risk of osteoporosis include maintaining a healthy weight and  avoiding alcohol and products that contain nicotine or tobacco. This information is not intended to replace advice given to you by your health care provider. Make sure you discuss any questions you have with your health care provider. Document Revised: 05/11/2019 Document Reviewed: 05/11/2019 Elsevier Patient Education  [place].

## 2020-02-16 LAB — COMPLETE METABOLIC PANEL WITH GFR
AG Ratio: 2.2 (calc) (ref 1.0–2.5)
ALT: 19 U/L (ref 6–29)
AST: 17 U/L (ref 10–35)
Albumin: 4.4 g/dL (ref 3.6–5.1)
Alkaline phosphatase (APISO): 64 U/L (ref 37–153)
BUN: 20 mg/dL (ref 7–25)
CO2: 29 mmol/L (ref 20–32)
Calcium: 9.8 mg/dL (ref 8.6–10.4)
Chloride: 107 mmol/L (ref 98–110)
Creat: 0.89 mg/dL (ref 0.50–0.99)
GFR, Est African American: 77 mL/min/{1.73_m2} (ref >=60)
GFR, Est Non African American: 66 mL/min/{1.73_m2} (ref >=60)
Globulin: 2 g/dL (calc) (ref 1.9–3.7)
Glucose, Bld: 109 mg/dL — ABNORMAL HIGH (ref 65–99)
Potassium: 4.3 mmol/L (ref 3.5–5.3)
Sodium: 144 mmol/L (ref 135–146)
Total Bilirubin: 0.5 mg/dL (ref 0.2–1.2)
Total Protein: 6.4 g/dL (ref 6.1–8.1)

## 2020-02-16 LAB — LIPID PANEL
Cholesterol: 131 mg/dL (ref <200)
HDL: 57 mg/dL (ref >=50)
LDL Cholesterol (Calc): 54 mg/dL (calc)
Non-HDL Cholesterol (Calc): 74 mg/dL (calc) (ref <130)
Total CHOL/HDL Ratio: 2.3 (calc) (ref <5.0)
Triglycerides: 121 mg/dL (ref <150)

## 2020-02-16 LAB — CBC WITH DIFFERENTIAL/PLATELET
Absolute Monocytes: 449 cells/uL (ref 200–950)
Basophils Absolute: 67 cells/uL (ref 0–200)
Basophils Relative: 1 %
Eosinophils Absolute: 261 cells/uL (ref 15–500)
Eosinophils Relative: 3.9 %
HCT: 42 % (ref 35.0–45.0)
Hemoglobin: 14 g/dL (ref 11.7–15.5)
Lymphs Abs: 1628 cells/uL (ref 850–3900)
MCH: 29.8 pg (ref 27.0–33.0)
MCHC: 33.3 g/dL (ref 32.0–36.0)
MCV: 89.4 fL (ref 80.0–100.0)
MPV: 11.1 fL (ref 7.5–12.5)
Monocytes Relative: 6.7 %
Neutro Abs: 4295 cells/uL (ref 1500–7800)
Neutrophils Relative %: 64.1 %
Platelets: 229 10*3/uL (ref 140–400)
RBC: 4.7 10*6/uL (ref 3.80–5.10)
RDW: 12.1 % (ref 11.0–15.0)
Total Lymphocyte: 24.3 %
WBC: 6.7 10*3/uL (ref 3.8–10.8)

## 2020-02-16 LAB — HEMOGLOBIN A1C
Hgb A1c MFr Bld: 5.7 % of total Hgb — ABNORMAL HIGH (ref <5.7)
Mean Plasma Glucose: 117 (calc)
eAG (mmol/L): 6.5 (calc)

## 2020-02-19 ENCOUNTER — Encounter: Payer: Self-pay | Admitting: Family Medicine

## 2020-02-19 ENCOUNTER — Other Ambulatory Visit: Payer: Self-pay | Admitting: Family Medicine

## 2020-02-19 DIAGNOSIS — E119 Type 2 diabetes mellitus without complications: Secondary | ICD-10-CM

## 2020-02-25 ENCOUNTER — Other Ambulatory Visit: Payer: Self-pay | Admitting: Family Medicine

## 2020-02-25 ENCOUNTER — Encounter: Payer: Self-pay | Admitting: Family Medicine

## 2020-02-25 ENCOUNTER — Telehealth: Payer: Self-pay | Admitting: Family Medicine

## 2020-02-25 DIAGNOSIS — E119 Type 2 diabetes mellitus without complications: Secondary | ICD-10-CM

## 2020-02-25 NOTE — Chronic Care Management (AMB) (Signed)
  Chronic Care Management   Note  02/25/2020 Name: DAMYIA STRIDER MRN: [id number] DOB: 07-10-1951  JAMANI ELEY is a 69 y.o. year old female who is a primary care patient of [facility]. I reached out to [name] by phone today in response to a referral sent by Ms. Reita Chard Meldrum's health plan.     Ms. Mcgonagle was given information about Chronic Care Management services today including:  1. CCM service includes personalized support from designated clinical staff supervised by her physician, including individualized plan of care and coordination with other care providers 2. 24/7 contact phone numbers for assistance for urgent and routine care needs. 3. Service will only be billed when office clinical staff spend 20 minutes or more in a month to coordinate care. 4. Only one practitioner may furnish and bill the service in a calendar month. 5. The patient may stop CCM services at any time (effective at the end of the month) by phone call to the office staff. 6. The patient will be responsible for cost sharing (co-pay) of up to 20% of the service fee (after annual deductible is met).  Patient agreed to services and verbal consent obtained.   Follow up plan: Telephone appointment with care management team member scheduled for:03/13/2020  Noreene Larsson, [place], [place], [place] [address] Direct Dial: [phone] [email or web address] Website: [email or web address]  SIGNATURE

## 2020-02-26 ENCOUNTER — Encounter: Payer: Self-pay | Admitting: Family Medicine

## 2020-03-02 IMAGING — MG DIGITAL SCREENING BILAT W/ TOMO W/ CAD
8 series · 8 of 24 positions shown · non-contrast
Comparison: Previous exam(s).

CLINICAL DATA: Screening.

EXAM:
DIGITAL SCREENING BILATERAL MAMMOGRAM WITH TOMO AND CAD

[L MLO synth-2D]
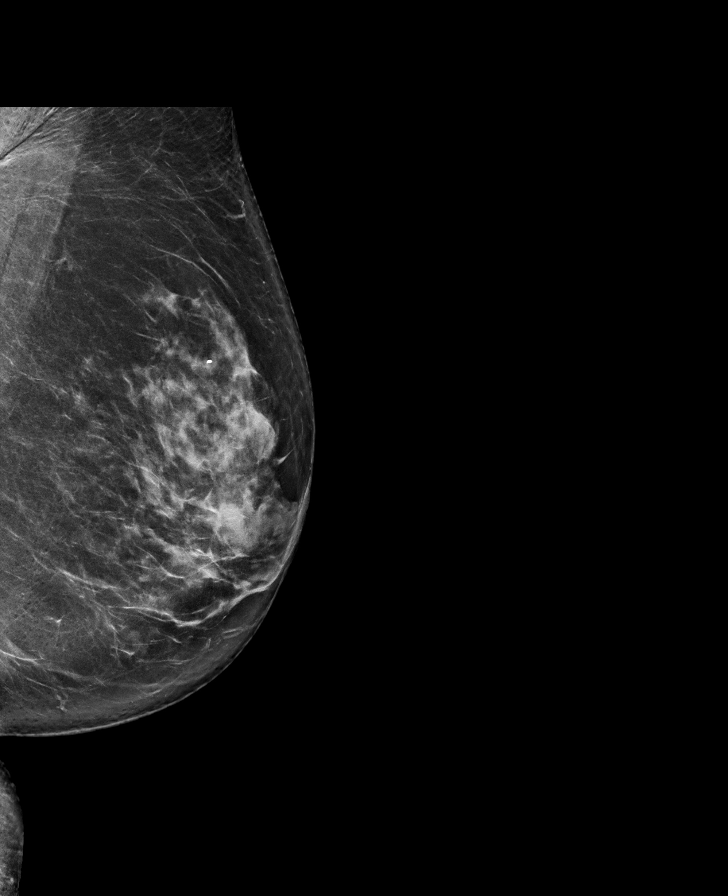

[R CC synth-2D]
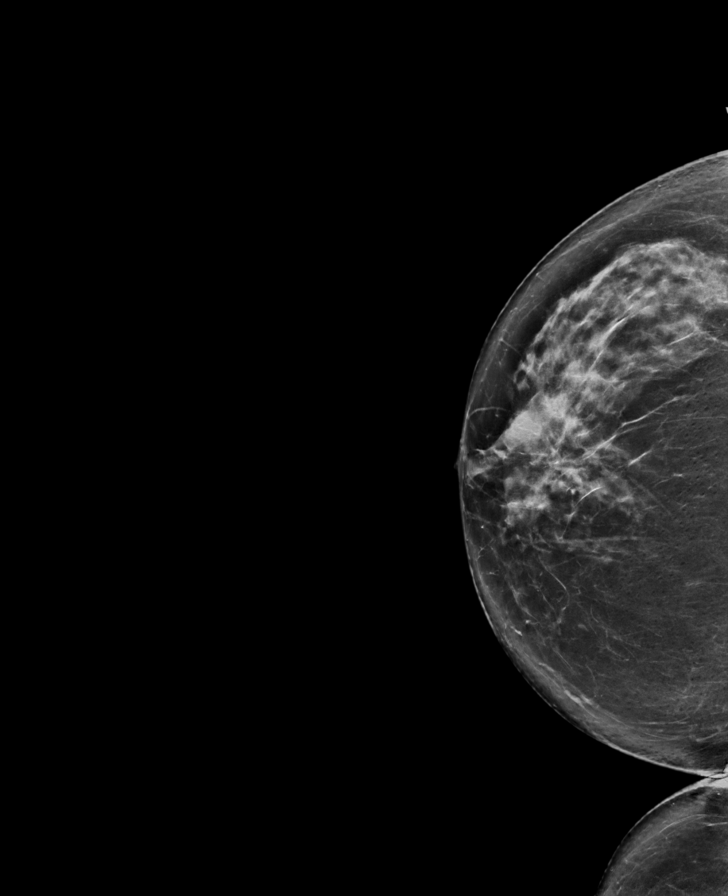

[L CC synth-2D]
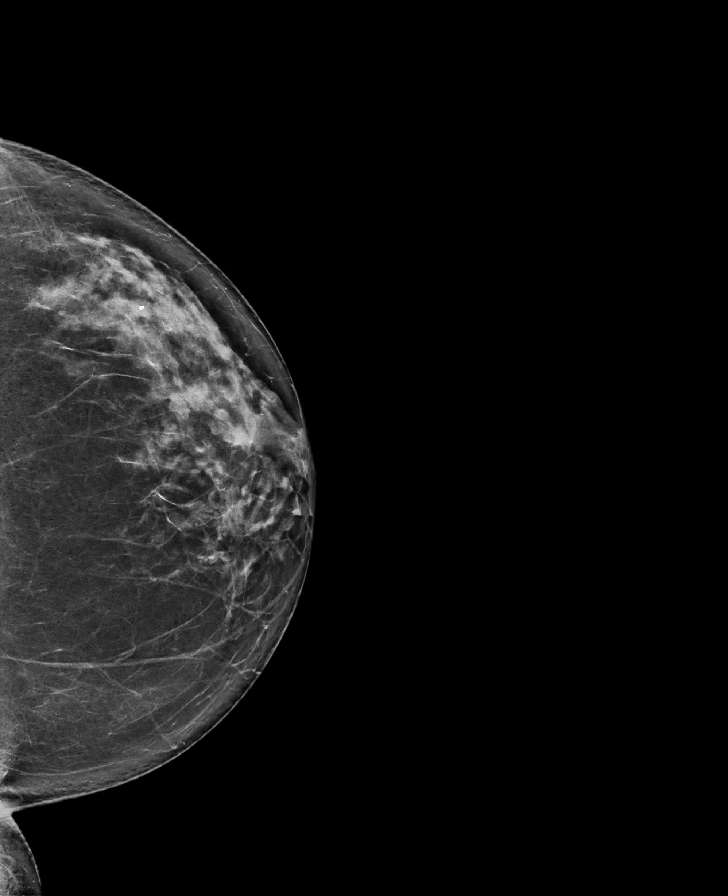

[R MLO synth-2D]
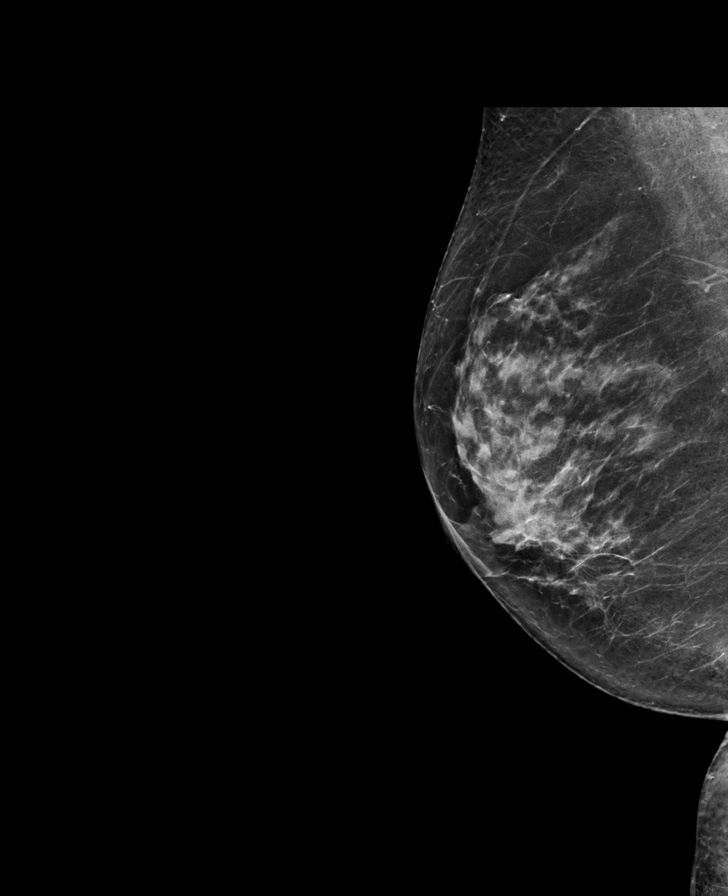

[L MLO tomo · tomo slice 45/88.0]
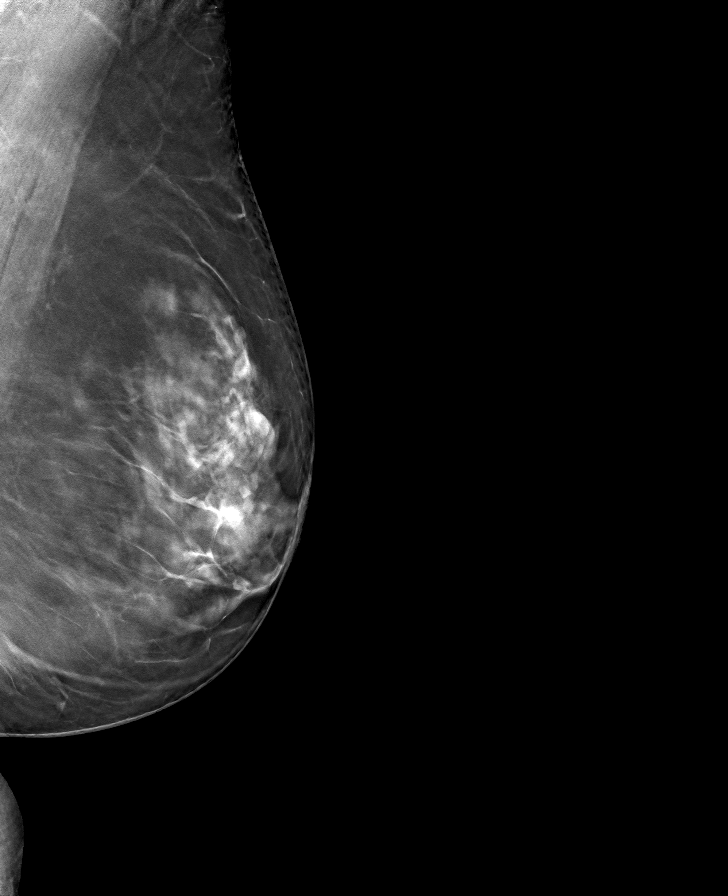

[L CC tomo · tomo slice 39/77.0]
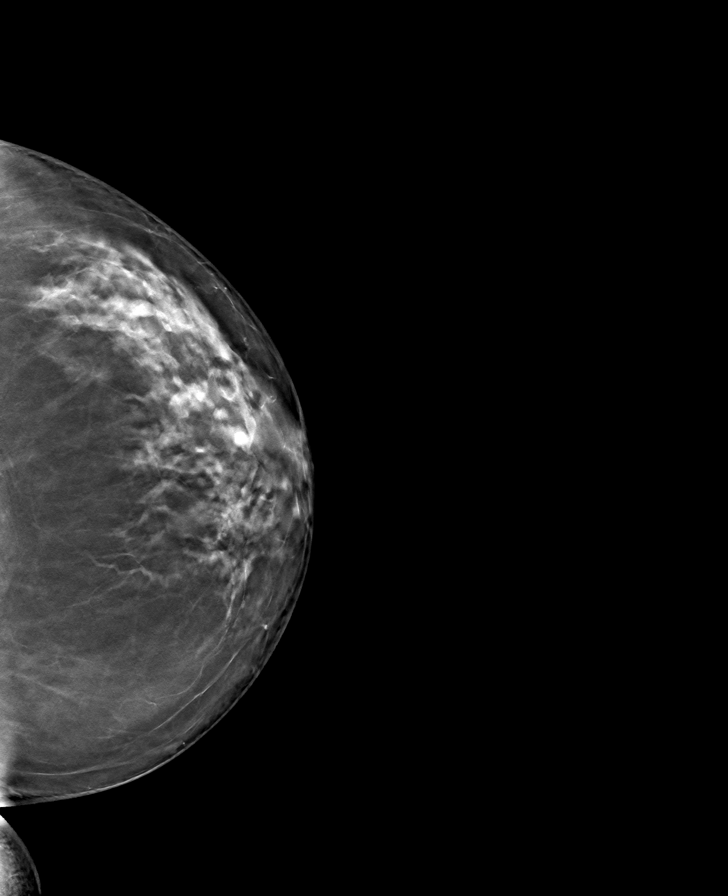

[R CC tomo · tomo slice 39/78.0]
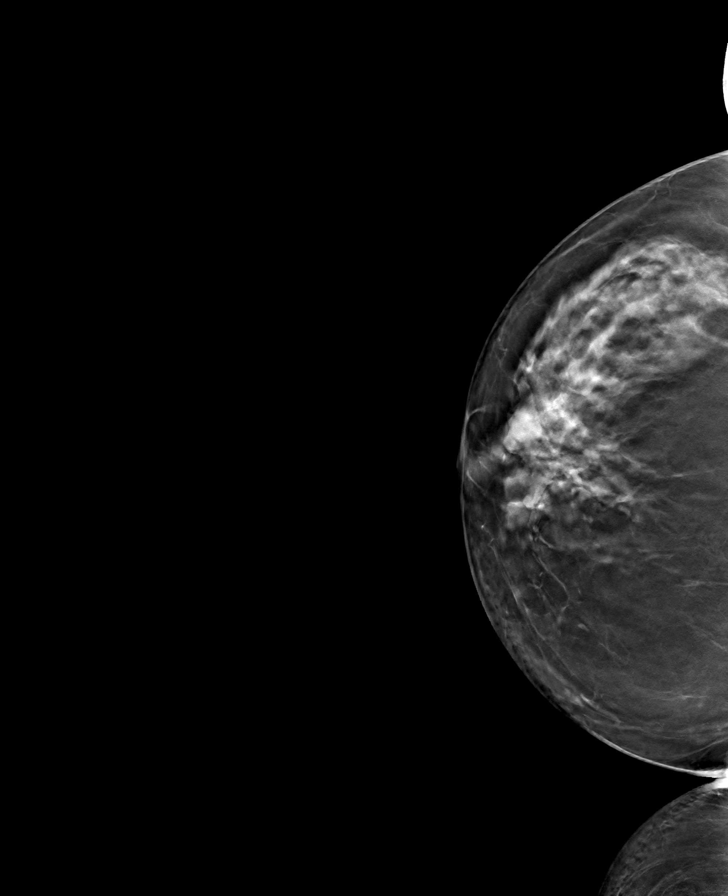

[R MLO tomo · tomo slice 40/79.0]
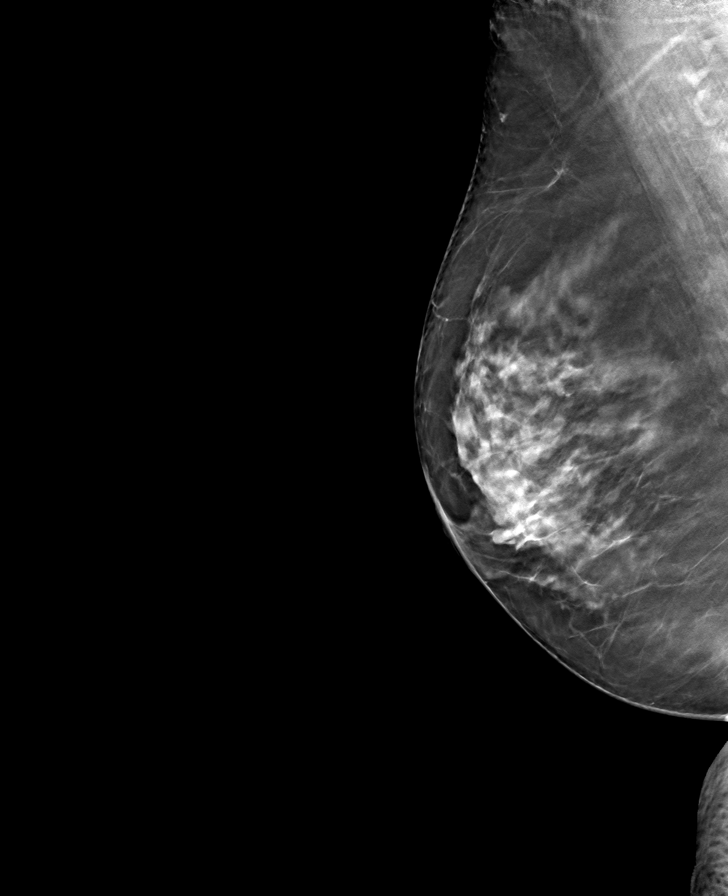

[8 of 24 positions shown; findings below may reference images not displayed]

ACR Breast Density Category c: The breast tissue is heterogeneously
dense, which may obscure small masses.
FINDINGS: There are no findings suspicious for malignancy. Images were
processed with CAD.
IMPRESSION: No mammographic evidence of malignancy. A result letter of this
screening mammogram will be mailed directly to the patient.

RECOMMENDATION:
Screening mammogram in one year. (Code:[id number]-[id number])

BI-RADS CATEGORY  1: Negative.

## 2020-03-13 ENCOUNTER — Other Ambulatory Visit: Payer: Self-pay

## 2020-03-13 ENCOUNTER — Ambulatory Visit: Payer: Medicare Other | Admitting: Pharmacist

## 2020-03-13 DIAGNOSIS — E1159 Type 2 diabetes mellitus with other circulatory complications: Secondary | ICD-10-CM

## 2020-03-13 DIAGNOSIS — M85852 Other specified disorders of bone density and structure, left thigh: Secondary | ICD-10-CM

## 2020-03-13 NOTE — Chronic Care Management (AMB) (Signed)
Chronic Care Management Pharmacy  Name: Felicia Cisneros  MRN: [id number] DOB: 03/15/1951  Chief Complaint/ HPI  Felicia Cisneros,  69 y.o. , female presents for their Initial CCM visit with the clinical pharmacist via telephone due to COVID-19 Pandemic.  PCP : [place]  Their chronic conditions include: DM, COPD, osteopenia, HLD, HTN  Office Visit"s: 4/23 DM, [place], BP 126/82 P 85 Wt 178 Ht 68 BMI 27.1, A1c 5.7% 12/8 DM, [place], stopped smoking 9 years ago, Pepcid and Tums, Needs VitD  Consult Visit: 11/23 HLD, [place], BP 130/90 P 71  Medications: Outpatient Encounter Medications as of 03/13/2020  Medication Sig  . acetaminophen (TYLENOL) 500 MG tablet Take 1,000 mg by mouth every 6 (six) hours as needed (for pain.).  Marland Kitchen aspirin EC 81 MG tablet Take 81 mg by mouth at bedtime.  Marland Kitchen BIOTIN PO Take by mouth daily.  . Blood Glucose Monitoring Suppl (ONETOUCH VERIO FLEX SYSTEM) w/Device KIT Check blood sugars up to twice daily prn Dx:E11.9 LON:99  . CINNAMON PO Take 1,000 mg by mouth 2 (two) times daily.  . clotrimazole-betamethasone (LOTRISONE) cream Apply externally BID PRN  . Coenzyme Q10 (COQ10) 100 MG CAPS One by mouth daily  . famotidine (PEPCID) 20 MG tablet Take 1 tablet (20 mg total) by mouth 2 (two) times daily.  Marland Kitchen glucose blood (ONETOUCH VERIO) test strip CHECK FINGERSTICK BLOOD SUGARS THREE TIMES A WEEK  . Lancets (ACCU-CHEK SOFT TOUCH) lancets Check fingerstick blood sugars three times a week; Dx: E11.9, LON:99 months  . lisinopril (ZESTRIL) 5 MG tablet Take 1 tablet (5 mg total) by mouth daily. for blood pressure  . LYSINE PO Take by mouth as needed.  . Melatonin 2.5 MG CHEW Chew 2.5 mg by mouth daily as needed.  . metFORMIN (GLUCOPHAGE-XR) 500 MG 24 hr tablet Take 1 tablet (500 mg total) by mouth daily with breakfast.  . Multiple Vitamins-Minerals (CENTRUM SILVER 50+WOMEN) TABS Take 1 tablet by mouth daily.  Felicia Cisneros 1  application by Does not apply route 3 (three) times a week. Dx:E11.9 LON:99 check BS 3x per week  . OVER THE COUNTER MEDICATION neuriva plus OTC supplement (like prevagen)  . pseudoephedrine (SUDAFED) 30 MG tablet Take 1 tablet (30 mg total) by mouth every 8 (eight) hours as needed for congestion. No more than 3 tablets/month  . simvastatin (ZOCOR) 40 MG tablet Take 1 tablet (40 mg total) by mouth at bedtime.  Marland Kitchen PREVIDENT 5000 PLUS 1.1 % CREA dental cream Place 1 application onto teeth daily as needed.   . Probiotic Product (TRUBIOTICS) CAPS Take 1 capsule by mouth daily.   No facility-administered encounter medications on file as of 03/13/2020.     Tobacco Use: Medium Risk  . Smoking Tobacco Use: Former Smoker  . Smokeless Tobacco Use: Never Used   Current Diagnosis/Assessment:  Goals Addressed            This Visit's Progress   . Chronic Care Management       CARE PLAN ENTRY  Current Barriers:  . Chronic Disease Management support, education, and care coordination needs related to Hyperlipidemia, Diabetes, and Osteopenia   Hyperlipidemia . Pharmacist Clinical Goal(s): o Over the next 90 days, patient will work with PharmD and providers to maintain LDL goal < 70 . Current regimen:  o Simvastatin 41m daily . Interventions: o None . Patient self care activities - Over the next 90 days, patient will: o Continue taking simvastatin  at night  Diabetes . Pharmacist Clinical Goal(s): o Over the next 90 days, patient will work with PharmD and providers to maintain A1c goal <7% . Current regimen:  o Metformin 537m daily, cinnamon . Interventions: o Stop probiotic o Start Activia yogurt with breakfast daily . Patient self care activities - Over the next 90 days, patient will: o Check blood sugar once daily, document, and provide at future appointments o Contact provider with any episodes of hypoglycemia o Substitute live culture yogurt for probiotic o Consider stopping  cinnamon due to little long term benefit and some cost  Osteopenia . Pharmacist Clinical Goal(s) o Over the next 90 days, patient will work with PharmD and providers to increase Calcium and Vitamin D intake . Current regimen:  o Milk with every meal . Interventions: o Take Viactive daily o Each yogurt with breakfast daily . Patient self care activities - Over the next 90 days, patient will: o Increase calcium and vitamin D from dietary sources  Medication management . Pharmacist Clinical Goal(s): o Over the next 90 days, patient will work with PharmD and providers to maintain optimal medication adherence . Current pharmacy: [facility] . Interventions o Comprehensive medication review performed. o Continue current medication management strategy . Patient self care activities - Over the next 90 days, patient will: o Focus on medication adherence by continuing current practices o Take medications as prescribed o Report any questions or concerns to PharmD and/or provider(s)  Initial goal documentation       Diabetes   Recent Relevant Labs: Lab Results  Component Value Date/Time   HGBA1C 5.7 (H) 02/15/2020 09:32 AM   HGBA1C 5.7 (H) 07/03/2019 12:00 AM   MICROALBUR 0.3 09/06/2018 02:11 PM   MICROALBUR 0.5 06/09/2017 09:12 AM     Checking BG: Daily  Recent FBG Readings: 109 - 139 Patient has failed these meds in past: NA Patient is currently controlled on the following medications: metformin, cinnamon  Last diabetic Foot exam:  Lab Results  Component Value Date/Time   HMDIABEYEEXA No Retinopathy 01/14/2017 12:00 AM    Last diabetic Eye exam: No results found for: HMDIABFOOTEX   We discussed:  Denies hypoglycemia Takes lisinopril at bedtime Metformin may be immediate release BG control is so good, she doesn't need cinnamon Counseled that a multiple live culture yogurt can be as effective as capsule probiotics   Plan D/c probiotic Start Activia one yogurt for  breakfast daily Continue current medications  Hyperlipidemia   Lipid Panel     Component Value Date/Time   CHOL 131 02/15/2020 0932   CHOL 143 11/27/2015 0957   TRIG 121 02/15/2020 0932   HDL 57 02/15/2020 0932   HDL 48 11/27/2015 0957   CHOLHDL 2.3 02/15/2020 0932   VLDL 23 06/09/2017 0912   LDLCALC 54 02/15/2020 0932   LABVLDL 28 11/27/2015 0957     The 10-year ASCVD risk score (Goff DC Jr., et al., 2013) is: 17.5%   Values used to calculate the score:     Age: [age]years     Sex: Female     Is Non-Hispanic African American: No     Diabetic: Yes     Tobacco smoker: No     Systolic Blood Pressure: 1850mmHg     Is BP treated: Yes     HDL Cholesterol: 57 mg/dL     Total Cholesterol: 131 mg/dL   Patient has failed these meds in past: NA Patient is currently controlled on the following medications: simvastatin, CoQ10  We discussed:   Denies myalgias Drinks too little water, doesn't like Crystal Light  Plan  Continue current medications  Osteopenia   Last DEXA Scan: 10/20   T-Score femoral neck: -1.7  T-Score total hip: -1.7  T-Score lumbar spine: -1.7  T-Score forearm radius: NA  10-year probability of major osteoporotic fracture: 10%  10-year probability of hip fracture: 1.4%  Vit D, 25-Hydroxy  Date Value Ref Range Status  05/31/2016 52 30 - 100 ng/mL Final    Comment:    Vitamin D Status           25-OH Vitamin D        Deficiency                <20 ng/mL        Insufficiency         20 - 29 ng/mL        Optimal             > or = 30 ng/mL   For 25-OH Vitamin D testing on patients on D2-supplementation and patients for whom quantitation of D2 and D3 fractions is required, the QuestAssureD 25-OH VIT D, (D2,D3), LC/MS/MS is recommended: order code [id number] (patients > 2 yrs).      Patient is not a candidate for pharmacologic treatment  Patient has failed these meds in past: NA Patient is currently controlled on the following medications: milk tid ac,  yogurt for breakfast  We discussed:  Recommend [phone] units of vitamin D daily. In study at cancer center, prior smoker, trying to catch lung cancer in nodule stage, used tar bar, bought Zippo recently without lighter fluid  Viactive chew rarely calcium + D 1 - 2 week  Plan  Take Viactive more often Continue current medications   Medication Management   Pt uses [facility] pharmacy for all medications Uses pill box? Yes Pt endorses 100% compliance Disadvantaged  We discussed:   Takes Neuriva Biotin 10089mg for hair and nails Sudafed 350mfor sinus headache with Tylenol (1 - 2) Lysine for mouth ulcers Melatonin about weekly Small tube of ointment  Plan  Continue current medication management strategy  Follow up: 3 month phone visit   TeMilus HeightPharmD, BCCummingCTChase City [facility] [phone]

## 2020-03-16 NOTE — Patient Instructions (Addendum)
Visit Information  Goals Addressed            This Visit's Progress   . Chronic Care Management       CARE PLAN ENTRY  Current Barriers:  . Chronic Disease Management support, education, and care coordination needs related to Hyperlipidemia, Diabetes, and Osteopenia   Hyperlipidemia . Pharmacist Clinical Goal(s): o Over the next 90 days, patient will work with PharmD and providers to maintain LDL goal < 70 . Current regimen:  o Simvastatin 40mg  daily . Interventions: o None . Patient self care activities - Over the next 90 days, patient will: o Continue taking simvastatin at night  Diabetes . Pharmacist Clinical Goal(s): o Over the next 90 days, patient will work with PharmD and providers to maintain A1c goal <7% . Current regimen:  o Metformin 500mg  daily, cinnamon . Interventions: o Stop probiotic o Start Activia yogurt with breakfast daily . Patient self care activities - Over the next 90 days, patient will: o Check blood sugar once daily, document, and provide at future appointments o Contact provider with any episodes of hypoglycemia o Substitute live culture yogurt for probiotic o Consider stopping cinnamon due to little long term benefit and some cost  Osteopenia . Pharmacist Clinical Goal(s) o Over the next 90 days, patient will work with PharmD and providers to increase Calcium and Vitamin D intake . Current regimen:  o Milk with every meal . Interventions: o Take Viactive daily o Each yogurt with breakfast daily . Patient self care activities - Over the next 90 days, patient will: o Increase calcium and vitamin D from dietary sources  Medication management . Pharmacist Clinical Goal(s): o Over the next 90 days, patient will work with PharmD and providers to maintain optimal medication adherence . Current pharmacy: [facility] . Interventions o Comprehensive medication review performed. o Continue current medication management strategy . Patient self care  activities - Over the next 90 days, patient will: o Focus on medication adherence by continuing current practices o Take medications as prescribed o Report any questions or concerns to PharmD and/or provider(s)  Initial goal documentation        Felicia Cisneros was given information about Chronic Care Management services today including:  1. CCM service includes personalized support from designated clinical staff supervised by her physician, including individualized plan of care and coordination with other care providers 2. 24/7 contact phone numbers for assistance for urgent and routine care needs. 3. Standard insurance, coinsurance, copays and deductibles apply for chronic care management only during months in which we provide at least 20 minutes of these services. Most insurances cover these services at 100%, however patients may be responsible for any copay, coinsurance and/or deductible if applicable. This service may help you avoid the need for more expensive face-to-face services. 4. Only one practitioner may furnish and bill the service in a calendar month. 5. The patient may stop CCM services at any time (effective at the end of the month) by phone call to the office staff.  Patient agreed to services and verbal consent obtained.   Print copy of patient instructions provided.  Telephone follow up appointment with pharmacy team member scheduled for: 3 months  Milus Height, PharmD, [place], [facility] [phone]  Osteopenia  Osteopenia is a loss of thickness (density) inside of the bones. Another name for osteopenia is low bone mass. Mild osteopenia is a normal part of aging. It is not a disease, and it does not cause symptoms.  However, if you have osteopenia and continue to lose bone mass, you could develop a condition that causes the bones to become thin and break more easily (osteoporosis). You may also lose some height, have back pain, and  have a stooped posture. Although osteopenia is not a disease, making changes to your lifestyle and diet can help to prevent osteopenia from developing into osteoporosis. What are the causes? Osteopenia is caused by loss of calcium in the bones.  Bones are constantly changing. Old bone cells are continually being replaced with new bone cells. This process builds new bone. The mineral calcium is needed to build new bone and maintain bone density. Bone density is usually highest around age 21. After that, most people's bodies cannot replace all the bone they have lost with new bone. What increases the risk? You are more likely to develop this condition if:  You are older than age 36.  You are a woman who went through menopause early.  You have a long illness that keeps you in bed.  You do not get enough exercise.  You lack certain nutrients (malnutrition).  You have an overactive thyroid gland (hyperthyroidism).  You smoke.  You drink a lot of alcohol.  You are taking medicines that weaken the bones, such as steroids. What are the signs or symptoms? This condition does not cause any symptoms. You may have a slightly higher risk for bone breaks (fractures), so getting fractures more easily than normal may be an indication of osteopenia. How is this diagnosed? Your health care provider can diagnose this condition with a special type of X-ray exam that measures bone density (dual-energy X-ray absorptiometry, DEXA). This test can measure bone density in your hips, spine, and wrists. Osteopenia has no symptoms, so this condition is usually diagnosed after a routine bone density screening test is done for osteoporosis. This routine screening is usually done for:  Women who are age 42 or older.  Men who are age 49 or older. If you have risk factors for osteopenia, you may have the screening test at an earlier age. How is this treated? Making dietary and lifestyle changes can lower your risk  for osteoporosis. If you have severe osteopenia that is close to becoming osteoporosis, your health care provider may prescribe medicines and dietary supplements such as calcium and vitamin D. These supplements help to rebuild bone density. Follow these instructions at home:   Take over-the-counter and prescription medicines only as told by your health care provider. These include vitamins and supplements.  Eat a diet that is high in calcium and vitamin D. ? Calcium is found in dairy products, beans, salmon, and leafy green vegetables like spinach and broccoli. ? Look for foods that have vitamin D and calcium added to them (fortified foods), such as orange juice, cereal, and bread.  Do 30 or more minutes of a weight-bearing exercise every day, such as walking, jogging, or playing a sport. These types of exercises strengthen the bones.  Take precautions at home to lower your risk of falling, such as: ? Keeping rooms well-lit and free of clutter, such as cords. ? Installing safety rails on stairs. ? Using rubber mats in the bathroom or other areas that are often wet or slippery.  Do not use any products that contain nicotine or tobacco, such as cigarettes and e-cigarettes. If you need help quitting, ask your health care provider.  Avoid alcohol or limit alcohol intake to no more than 1 drink a day for nonpregnant  women and 2 drinks a day for men. One drink equals 12 oz of beer, 5 oz of wine, or 1 oz of hard liquor.  Keep all follow-up visits as told by your health care provider. This is important. Contact a health care provider if:  You have not had a bone density screening for osteoporosis and you are: ? A woman, age 72 or older. ? A man, age 41 or older.  You are a postmenopausal woman who has not had a bone density screening for osteoporosis.  You are older than age 6 and you want to know if you should have bone density screening for osteoporosis. Summary  Osteopenia is a loss of  thickness (density) inside of the bones. Another name for osteopenia is low bone mass.  Osteopenia is not a disease, but it may increase your risk for a condition that causes the bones to become thin and break more easily (osteoporosis).  You may be at risk for osteopenia if you are older than age 57 or if you are a woman who went through early menopause.  Osteopenia does not cause any symptoms, but it can be diagnosed with a bone density screening test.  Dietary and lifestyle changes are the first treatment for osteopenia. These may lower your risk for osteoporosis. This information is not intended to replace advice given to you by your health care provider. Make sure you discuss any questions you have with your health care provider. Document Revised: 09/23/2017 Document Reviewed: 07/20/2017 Elsevier Patient Education  2020 [employer].

## 2020-03-21 ENCOUNTER — Encounter: Payer: Self-pay | Admitting: Family Medicine

## 2020-04-10 ENCOUNTER — Encounter: Payer: Self-pay | Admitting: Family Medicine

## 2020-04-28 ENCOUNTER — Other Ambulatory Visit: Payer: Self-pay | Admitting: Family Medicine

## 2020-05-04 ENCOUNTER — Other Ambulatory Visit: Payer: Self-pay | Admitting: Family Medicine

## 2020-05-04 ENCOUNTER — Encounter: Payer: Self-pay | Admitting: Family Medicine

## 2020-05-04 DIAGNOSIS — R0981 Nasal congestion: Secondary | ICD-10-CM

## 2020-05-14 ENCOUNTER — Other Ambulatory Visit: Payer: Self-pay

## 2020-05-14 ENCOUNTER — Emergency Department
Admission: EM | Admit: 2020-05-14 | Discharge: 2020-05-14 | Disposition: A | Discharge status: Home / Self Care | Payer: Medicare Other | Attending: Emergency Medicine | Admitting: Emergency Medicine

## 2020-05-14 DIAGNOSIS — Z5321 Procedure and treatment not carried out due to patient leaving prior to being seen by health care provider: Secondary | ICD-10-CM | POA: Insufficient documentation

## 2020-05-14 DIAGNOSIS — R42 Dizziness and giddiness: Secondary | ICD-10-CM | POA: Diagnosis present

## 2020-05-14 DIAGNOSIS — R202 Paresthesia of skin: Secondary | ICD-10-CM | POA: Diagnosis not present

## 2020-05-14 LAB — CBC
HCT: 36.5 % (ref 36.0–46.0)
Hemoglobin: 12.1 g/dL (ref 12.0–15.0)
MCH: 29.5 pg (ref 26.0–34.0)
MCHC: 33.2 g/dL (ref 30.0–36.0)
MCV: 89 fL (ref 80.0–100.0)
Platelets: 251 10*3/uL (ref 150–400)
RBC: 4.1 MIL/uL (ref 3.87–5.11)
RDW: 14.2 % (ref 11.5–15.5)
WBC: 6.8 10*3/uL (ref 4.0–10.5)
nRBC: 0 % (ref 0.0–0.2)

## 2020-05-14 LAB — BASIC METABOLIC PANEL
Anion gap: 9 (ref 5–15)
BUN: 18 mg/dL (ref 8–23)
CO2: 24 mmol/L (ref 22–32)
Calcium: 8.9 mg/dL (ref 8.9–10.3)
Chloride: 106 mmol/L (ref 98–111)
Creatinine, Ser: 0.85 mg/dL (ref 0.44–1.00)
GFR calc Af Amer: >60 mL/min (ref >60)
GFR calc non Af Amer: >60 mL/min (ref >60)
Glucose, Bld: 178 mg/dL — ABNORMAL HIGH (ref 70–99)
Potassium: 3.6 mmol/L (ref 3.5–5.1)
Sodium: 139 mmol/L (ref 135–145)

## 2020-05-14 LAB — URINALYSIS, COMPLETE (UACMP) WITH MICROSCOPIC
Bacteria, UA: NONE SEEN
Bilirubin Urine: NEGATIVE
Glucose, UA: NEGATIVE mg/dL
Hgb urine dipstick: NEGATIVE
Ketones, ur: NEGATIVE mg/dL
Leukocytes,Ua: NEGATIVE
Nitrite: NEGATIVE
Protein, ur: NEGATIVE mg/dL
Specific Gravity, Urine: 1.008 (ref 1.005–1.030)
Squamous Epithelial / HPF: NONE SEEN (ref 0–5)
pH: 6 (ref 5.0–8.0)

## 2020-05-14 NOTE — ED Triage Notes (Signed)
Pt arrives to ER c/o of dizziness and tingling. A&O, speaking in complete sentences. Pt states gave blood Monday and had HA. States hasn't felt well since. States dizziness began today. Able to move all limbs on own. Hx anxiety. Denies hx of vertigo.

## 2020-05-16 ENCOUNTER — Encounter: Payer: Self-pay | Admitting: Family Medicine

## 2020-05-23 NOTE — Telephone Encounter (Signed)
Generic or sudafed behind the counter from the pharmacist is not prescription and generic is usually available for around $5.  I refused because OTC meds prescribed by me usually are not covered and result in a large amount of work.  When same effective meds are there and available for a few dollars.    Also I am responsible for all SE of meds I prescribe - there are a lot of SE with sudafed.

## 2020-06-11 ENCOUNTER — Other Ambulatory Visit: Payer: Self-pay

## 2020-06-11 DIAGNOSIS — E782 Mixed hyperlipidemia: Secondary | ICD-10-CM

## 2020-06-11 DIAGNOSIS — I700 Atherosclerosis of aorta: Secondary | ICD-10-CM

## 2020-06-11 MED ORDER — SIMVASTATIN 40 MG PO TABS: 40.0000 mg | ORAL_TABLET | Freq: Every day | ORAL | 3 refills | Status: DC

## 2020-06-17 ENCOUNTER — Other Ambulatory Visit: Payer: Self-pay

## 2020-06-17 ENCOUNTER — Ambulatory Visit (INDEPENDENT_AMBULATORY_CARE_PROVIDER_SITE_OTHER): Payer: Medicare Other | Admitting: Pharmacist

## 2020-06-17 DIAGNOSIS — E1159 Type 2 diabetes mellitus with other circulatory complications: Secondary | ICD-10-CM

## 2020-06-17 DIAGNOSIS — M85852 Other specified disorders of bone density and structure, left thigh: Secondary | ICD-10-CM

## 2020-06-17 NOTE — Chronic Care Management (AMB) (Signed)
Chronic Care Management Pharmacy  Name: Felicia Cisneros  MRN: [id number] DOB: Jul 28, 1951  Chief Complaint/ HPI  Felicia Cisneros,  69 y.o. , female presents for their Follow-Up CCM visit with the clinical pharmacist via telephone due to COVID-19 Pandemic.  PCP : [name], PA-C  Their chronic conditions include: HTN, HLD, osteopenia  Office Visits:NA  Consult Visit:NA  Medications: Outpatient Encounter Medications as of 06/17/2020  Medication Sig  . Calcium-Vitamin D-Vitamin K (VIACTIV CALCIUM PLUS D) 650-12.5-40 MG-MCG-MCG CHEW Chew 1 Troche by mouth in the morning and at bedtime.  Marland Kitchen acetaminophen (TYLENOL) 500 MG tablet Take 1,000 mg by mouth every 6 (six) hours as needed (for pain.).  Marland Kitchen aspirin EC 81 MG tablet Take 81 mg by mouth at bedtime.  Marland Kitchen BIOTIN PO Take by mouth daily.  . Blood Glucose Monitoring Suppl (ONETOUCH VERIO FLEX SYSTEM) w/Device KIT Check blood sugars up to twice daily prn Dx:E11.9 LON:99  . CINNAMON PO Take 1,000 mg by mouth 2 (two) times daily.  . clotrimazole-betamethasone (LOTRISONE) cream Apply externally BID PRN  . Coenzyme Q10 (COQ10) 100 MG CAPS One by mouth daily  . famotidine (PEPCID) 20 MG tablet TAKE 1 TABLET BY MOUTH  TWICE DAILY  . glucose blood (ONETOUCH VERIO) test strip CHECK FINGERSTICK BLOOD SUGARS THREE TIMES A WEEK  . Lancets (ACCU-CHEK SOFT TOUCH) lancets Check fingerstick blood sugars three times a week; Dx: E11.9, LON:99 months  . lisinopril (ZESTRIL) 5 MG tablet Take 1 tablet (5 mg total) by mouth daily. for blood pressure  . LYSINE PO Take by mouth as needed.  . Melatonin 2.5 MG CHEW Chew 2.5 mg by mouth daily as needed.  . metFORMIN (GLUCOPHAGE-XR) 500 MG 24 hr tablet Take 1 tablet (500 mg total) by mouth daily with breakfast.  . Multiple Vitamins-Minerals (CENTRUM SILVER 50+WOMEN) TABS Take 1 tablet by mouth daily.  Glory Rosebush Delica Lancets 82X MISC 1 application by Does not apply route 3 (three) times a week. Dx:E11.9 LON:99  check BS 3x per week  . OVER THE COUNTER MEDICATION neuriva plus OTC supplement (like prevagen)  . PREVIDENT 5000 PLUS 1.1 % CREA dental cream Place 1 application onto teeth daily as needed.   . Probiotic Product (TRUBIOTICS) CAPS Take 1 capsule by mouth daily.  . pseudoephedrine (SUDAFED) 30 MG tablet Take 1 tablet (30 mg total) by mouth every 8 (eight) hours as needed for congestion. No more than 3 tablets/month  . simvastatin (ZOCOR) 40 MG tablet Take 1 tablet (40 mg total) by mouth at bedtime.   No facility-administered encounter medications on file as of 06/17/2020.      Financial Resource Strain: Low Risk   . Difficulty of Paying Living Expenses: Not hard at all    Current Diagnosis/Assessment:  Goals Addressed            This Visit's Progress   . Chronic Care Management       CARE PLAN ENTRY  Current Barriers:  . Chronic Disease Management support, education, and care coordination needs related to Hyperlipidemia, Diabetes, and Osteopenia   Hyperlipidemia . Pharmacist Clinical Goal(s): o Over the next 90 days, patient will work with PharmD and providers to maintain LDL goal < 70 . Current regimen:  o Simvastatin 24m daily . Interventions: o Increased CoQ10 . Patient self care activities - Over the next 90 days, patient will: o Continue taking simvastatin at night  Diabetes . Pharmacist Clinical Goal(s): o Over the next 90 days, patient will work  with PharmD and providers to maintain A1c goal <7% . Current regimen:  o Metformin 527m daily, cinnamon . Interventions: o Restart probiotic o Did not start Activia yogurt with breakfast daily . Patient self care activities - Over the next 90 days, patient will: o Check blood sugar once daily, document, and provide at future appointments o Contact provider with any episodes of hypoglycemia o Substitute live culture yogurt for probiotic o Consider stopping cinnamon due to little long term benefit and some  cost  Osteopenia . Pharmacist Clinical Goal(s) o Over the next 90 days, patient will work with PharmD and providers to increase Calcium and Vitamin D intake . Current regimen:  o Milk with every meal . Interventions: o Take Viactive daily o Each yogurt with breakfast daily . Patient self care activities - Over the next 90 days, patient will: o Increase calcium and vitamin D from dietary sources  Medication management . Pharmacist Clinical Goal(s): o Over the next 90 days, patient will work with PharmD and providers to maintain optimal medication adherence . Current pharmacy: [facility] . Interventions o Comprehensive medication review performed. o Continue current medication management strategy . Patient self care activities - Over the next 90 days, patient will: o Focus on medication adherence by continuing current practices o Take medications as prescribed o Report any questions or concerns to PharmD and/or provider(s)  Initial goal documentation       Diabetes   Recent Relevant Labs: Lab Results  Component Value Date/Time   HGBA1C 5.7 (H) 02/15/2020 09:32 AM   HGBA1C 5.7 (H) 07/03/2019 12:00 AM   MICROALBUR 0.3 09/06/2018 02:11 PM   MICROALBUR 0.5 06/09/2017 09:12 AM     Patient is currently controlled on the following medications: metformin  Last diabetic Foot exam:  Lab Results  Component Value Date/Time   HMDIABEYEEXA No Retinopathy 01/14/2017 12:00 AM    Last diabetic Eye exam: No results found for: HMDIABFOOTEX   We discussed:  At goal Splenda diabetic care meal replacement Started Activia? No, probably switch back to probiotics  Plan  Continue current medications   Osteopenia    Vit D, 25-Hydroxy  Date Value Ref Range Status  05/31/2016 52 30 - 100 ng/mL Final    Comment:    Vitamin D Status           25-OH Vitamin D        Deficiency                <20 ng/mL        Insufficiency         20 - 29 ng/mL        Optimal             > or = 30  ng/mL   For 25-OH Vitamin D testing on patients on D2-supplementation and patients for whom quantitation of D2 and D3 fractions is required, the QuestAssureD 25-OH VIT D, (D2,D3), LC/MS/MS is recommended: order code [id number](patients > 2 yrs).      Patient is not a candidate for pharmacologic treatment  Patient has failed these meds in past: NA Patient is currently controlled on the following medications:  .Marland KitchenViactiv  We discussed: Forgot to increase Viactiv twice daily recommended at initial visit  Plan  Continue current medications   Medication Management   [facility] Uses pill box? Yes Pt endorses 100% compliance  We discussed: Zippo lighter? Should I take CoQ10? Yes Legs hurt at night  Plan  Continue current medication management strategy  Follow up: 3 month phone visit  Milus Height, PharmD, [place], [facility] [phone]

## 2020-06-19 NOTE — Patient Instructions (Addendum)
Visit Information  Goals Addressed            This Visit's Progress    Chronic Care Management       CARE PLAN ENTRY  Current Barriers:   Chronic Disease Management support, education, and care coordination needs related to Hyperlipidemia, Diabetes, and Osteopenia   Hyperlipidemia  Pharmacist Clinical Goal(s): o Over the next 90 days, patient will work with PharmD and providers to maintain LDL goal < 70  Current regimen:  o Simvastatin 40mg  daily  Interventions: o Increased CoQ10  Patient self care activities - Over the next 90 days, patient will: o Continue taking simvastatin at night  Diabetes  Pharmacist Clinical Goal(s): o Over the next 90 days, patient will work with PharmD and providers to maintain A1c goal <7%  Current regimen:  o Metformin 500mg  daily, cinnamon  Interventions: o Restart probiotic o Did not start Activia yogurt with breakfast daily  Patient self care activities - Over the next 90 days, patient will: o Check blood sugar once daily, document, and provide at future appointments o Contact provider with any episodes of hypoglycemia o Substitute live culture yogurt for probiotic o Consider stopping cinnamon due to little long term benefit and some cost  Osteopenia  Pharmacist Clinical Goal(s) o Over the next 90 days, patient will work with PharmD and providers to increase Calcium and Vitamin D intake  Current regimen:  o Milk with every meal  Interventions: o Take Viactive daily o Each yogurt with breakfast daily  Patient self care activities - Over the next 90 days, patient will: o Increase calcium and vitamin D from dietary sources  Medication management  Pharmacist Clinical Goal(s): o Over the next 90 days, patient will work with PharmD and providers to maintain optimal medication adherence  Current pharmacy: [facility]  Interventions o Comprehensive medication review performed. o Continue current medication management  strategy  Patient self care activities - Over the next 90 days, patient will: o Focus on medication adherence by continuing current practices o Take medications as prescribed o Report any questions or concerns to PharmD and/or provider(s)  Initial goal documentation        Felicia Cisneros was given information about Chronic Care Management services today including:  1. CCM service includes personalized support from designated clinical staff supervised by her physician, including individualized plan of care and coordination with other care providers 2. 24/7 contact phone numbers for assistance for urgent and routine care needs. 3. Standard insurance, coinsurance, copays and deductibles apply for chronic care management only during months in which we provide at least 20 minutes of these services. Most insurances cover these services at 100%, however patients may be responsible for any copay, coinsurance and/or deductible if applicable. This service may help you avoid the need for more expensive face-to-face services. 4. Only one practitioner may furnish and bill the service in a calendar month. 5. The patient may stop CCM services at any time (effective at the end of the month) by phone call to the office staff.  Patient agreed to services and verbal consent obtained.   Print copy of patient instructions provided.  Telephone follow up appointment with pharmacy team member scheduled for: 3 months  Milus Height, PharmD, [facility], [facility] [phone]  Osteopenia  Osteopenia is a loss of thickness (density) inside of the bones. Another name for osteopenia is low bone mass. Mild osteopenia is a normal part of aging. It is not a disease, and it does  not cause symptoms. However, if you have osteopenia and continue to lose bone mass, you could develop a condition that causes the bones to become thin and break more easily (osteoporosis). You may also lose  some height, have back pain, and have a stooped posture. Although osteopenia is not a disease, making changes to your lifestyle and diet can help to prevent osteopenia from developing into osteoporosis. What are the causes? Osteopenia is caused by loss of calcium in the bones.  Bones are constantly changing. Old bone cells are continually being replaced with new bone cells. This process builds new bone. The mineral calcium is needed to build new bone and maintain bone density. Bone density is usually highest around age 25. After that, most people's bodies cannot replace all the bone they have lost with new bone. What increases the risk? You are more likely to develop this condition if:  You are older than age 21.  You are a woman who went through menopause early.  You have a long illness that keeps you in bed.  You do not get enough exercise.  You lack certain nutrients (malnutrition).  You have an overactive thyroid gland (hyperthyroidism).  You smoke.  You drink a lot of alcohol.  You are taking medicines that weaken the bones, such as steroids. What are the signs or symptoms? This condition does not cause any symptoms. You may have a slightly higher risk for bone breaks (fractures), so getting fractures more easily than normal may be an indication of osteopenia. How is this diagnosed? Your health care provider can diagnose this condition with a special type of X-ray exam that measures bone density (dual-energy X-ray absorptiometry, DEXA). This test can measure bone density in your hips, spine, and wrists. Osteopenia has no symptoms, so this condition is usually diagnosed after a routine bone density screening test is done for osteoporosis. This routine screening is usually done for:  Women who are age 96 or older.  Men who are age 75 or older. If you have risk factors for osteopenia, you may have the screening test at an earlier age. How is this treated? Making dietary and  lifestyle changes can lower your risk for osteoporosis. If you have severe osteopenia that is close to becoming osteoporosis, your health care provider may prescribe medicines and dietary supplements such as calcium and vitamin D. These supplements help to rebuild bone density. Follow these instructions at home:   Take over-the-counter and prescription medicines only as told by your health care provider. These include vitamins and supplements.  Eat a diet that is high in calcium and vitamin D. ? Calcium is found in dairy products, beans, salmon, and leafy green vegetables like spinach and broccoli. ? Look for foods that have vitamin D and calcium added to them (fortified foods), such as orange juice, cereal, and bread.  Do 30 or more minutes of a weight-bearing exercise every day, such as walking, jogging, or playing a sport. These types of exercises strengthen the bones.  Take precautions at home to lower your risk of falling, such as: ? Keeping rooms well-lit and free of clutter, such as cords. ? Installing safety rails on stairs. ? Using rubber mats in the bathroom or other areas that are often wet or slippery.  Do not use any products that contain nicotine or tobacco, such as cigarettes and e-cigarettes. If you need help quitting, ask your health care provider.  Avoid alcohol or limit alcohol intake to no more than 1 drink a  day for nonpregnant women and 2 drinks a day for men. One drink equals 12 oz of beer, 5 oz of wine, or 1 oz of hard liquor.  Keep all follow-up visits as told by your health care provider. This is important. Contact a health care provider if:  You have not had a bone density screening for osteoporosis and you are: ? A woman, age 9 or older. ? A man, age 68 or older.  You are a postmenopausal woman who has not had a bone density screening for osteoporosis.  You are older than age 52 and you want to know if you should have bone density screening for  osteoporosis. Summary  Osteopenia is a loss of thickness (density) inside of the bones. Another name for osteopenia is low bone mass.  Osteopenia is not a disease, but it may increase your risk for a condition that causes the bones to become thin and break more easily (osteoporosis).  You may be at risk for osteopenia if you are older than age 54 or if you are a woman who went through early menopause.  Osteopenia does not cause any symptoms, but it can be diagnosed with a bone density screening test.  Dietary and lifestyle changes are the first treatment for osteopenia. These may lower your risk for osteoporosis. This information is not intended to replace advice given to you by your health care provider. Make sure you discuss any questions you have with your health care provider. Document Revised: 09/23/2017 Document Reviewed: 07/20/2017 Elsevier Patient Education  2020 [employer].

## 2020-06-21 ENCOUNTER — Telehealth: Payer: Self-pay | Admitting: *Deleted

## 2020-06-21 NOTE — Telephone Encounter (Signed)
Patient notified that lung cancer screening imaging is due currently or in the near future. Appointment made for 07/17/2020 at 1:15pm. Patient is a former smoker.

## 2020-06-24 ENCOUNTER — Encounter: Payer: Self-pay | Admitting: Family Medicine

## 2020-06-24 LAB — HM HEPATITIS C SCREENING LAB: HM Hepatitis Screen: NEGATIVE

## 2020-06-25 ENCOUNTER — Other Ambulatory Visit: Payer: Self-pay

## 2020-06-25 ENCOUNTER — Other Ambulatory Visit: Payer: Self-pay | Admitting: Family Medicine

## 2020-06-25 DIAGNOSIS — E119 Type 2 diabetes mellitus without complications: Secondary | ICD-10-CM

## 2020-06-25 DIAGNOSIS — E042 Nontoxic multinodular goiter: Secondary | ICD-10-CM

## 2020-06-25 MED ORDER — ONETOUCH VERIO FLEX SYSTEM W/DEVICE KIT: PACK | 0 refills | Status: DC

## 2020-06-25 NOTE — Progress Notes (Signed)
Thyroid nodules - Last US thyroid reviewed, dated 07/19/2019 - recommended 5 years of annual monitoring F/up thyroid US ordered - pt will be notified    ICD-10-CM   1. Multiple thyroid nodules  E04.2 US THYROID   07/19/2019 study recommends annual thyroid US x 5 years    [name], [place]

## 2020-07-01 ENCOUNTER — Ambulatory Visit
Admission: RE | Admit: 2020-07-01 | Discharge: 2020-07-01 | Disposition: A | Discharge status: Home / Self Care | Payer: Medicare Other | Source: Ambulatory Visit | Attending: Family Medicine | Admitting: Family Medicine

## 2020-07-01 ENCOUNTER — Other Ambulatory Visit: Payer: Self-pay | Admitting: *Deleted

## 2020-07-01 ENCOUNTER — Other Ambulatory Visit: Payer: Self-pay

## 2020-07-01 DIAGNOSIS — E042 Nontoxic multinodular goiter: Secondary | ICD-10-CM | POA: Diagnosis present

## 2020-07-01 DIAGNOSIS — Z87891 Personal history of nicotine dependence: Secondary | ICD-10-CM

## 2020-07-01 DIAGNOSIS — Z122 Encounter for screening for malignant neoplasm of respiratory organs: Secondary | ICD-10-CM

## 2020-07-08 ENCOUNTER — Encounter: Payer: Self-pay | Admitting: Family Medicine

## 2020-07-08 ENCOUNTER — Other Ambulatory Visit: Payer: Self-pay

## 2020-07-08 ENCOUNTER — Telehealth (INDEPENDENT_AMBULATORY_CARE_PROVIDER_SITE_OTHER): Payer: Medicare Other | Admitting: Family Medicine

## 2020-07-08 ENCOUNTER — Ambulatory Visit (INDEPENDENT_AMBULATORY_CARE_PROVIDER_SITE_OTHER): Payer: Medicare Other

## 2020-07-08 VITALS — BP 108/76 | HR 84 | Temp 97.7°F | Resp 14 | Ht 68.0 in | Wt 178.0 lb

## 2020-07-08 VITALS — BP 108/76 | HR 84 | Temp 97.7°F | Resp 16 | Ht 68.0 in | Wt 178.8 lb

## 2020-07-08 DIAGNOSIS — N952 Postmenopausal atrophic vaginitis: Secondary | ICD-10-CM

## 2020-07-08 DIAGNOSIS — Z23 Encounter for immunization: Secondary | ICD-10-CM | POA: Diagnosis not present

## 2020-07-08 DIAGNOSIS — Z0000 Encounter for general adult medical examination without abnormal findings: Secondary | ICD-10-CM

## 2020-07-08 DIAGNOSIS — Z1231 Encounter for screening mammogram for malignant neoplasm of breast: Secondary | ICD-10-CM | POA: Diagnosis not present

## 2020-07-08 DIAGNOSIS — N761 Subacute and chronic vaginitis: Secondary | ICD-10-CM

## 2020-07-08 DIAGNOSIS — E119 Type 2 diabetes mellitus without complications: Secondary | ICD-10-CM

## 2020-07-08 MED ORDER — ONETOUCH VERIO VI STRP: ORAL_STRIP | 3 refills | Status: DC

## 2020-07-08 MED ORDER — PREMARIN 0.625 MG/GM VA CREA: 1.0000 | TOPICAL_CREAM | VAGINAL | 3 refills | Status: DC

## 2020-07-08 NOTE — Progress Notes (Signed)
Subjective:   Felicia Cisneros is a 69 y.o. female who presents for Medicare Annual (Subsequent) preventive examination.  Review of Systems     Cardiac Risk Factors include: advanced age (>50men, >19 women);diabetes mellitus;dyslipidemia;hypertension     Objective:    Today's Vitals   07/08/20 0901  BP: 108/76  Pulse: 84  Resp: 16  Temp: 97.7 F (36.5 C)  TempSrc: Oral  SpO2: 96%  Weight: 178 lb 12.8 oz (81.1 kg)  Height: $Remove'5\' 8"'KheSjqi$  (1.727 m)   Body mass index is 27.19 kg/m.  Advanced Directives 07/08/2020 05/14/2020 07/03/2019 06/05/2019 08/10/2018 06/29/2018 02/28/2018  Does Patient Have a Medical Advance Directive? Yes Yes Yes No Yes Yes Yes  Type of Paramedic of [place];Living will [place];Living will [place];Living will - [place];Living will;Out of facility DNR (pink MOST or yellow form) [place];Living will [place];Living will  Does patient want to make changes to medical advance directive? - - - - No - Patient declined - -  Copy of [place] in Chart? No - copy requested - No - copy requested - No - copy requested No - copy requested -  Would patient like information on creating a medical advance directive? - - - No - Patient declined - - -    Current Medications (verified) Outpatient Encounter Medications as of 07/08/2020  Medication Sig  . acetaminophen (TYLENOL) 500 MG tablet Take 1,000 mg by mouth every 6 (six) hours as needed (for pain.).  Marland Kitchen BIOTIN PO Take by mouth daily.  . Blood Glucose Monitoring Suppl (ONETOUCH VERIO FLEX SYSTEM) w/Device KIT Check blood sugars up to twice daily prn Dx:E11.9 LON:99  . Calcium-Vitamin D-Vitamin K (VIACTIV CALCIUM PLUS D) 650-12.5-40 MG-MCG-MCG CHEW Chew 1 [place] by mouth in the morning and at bedtime.  Marland Kitchen CINNAMON PO Take 1,000 mg by mouth 2 (two) times daily.  . Coenzyme Q10 (COQ10) 100 MG  CAPS One by mouth daily  . famotidine (PEPCID) 20 MG tablet TAKE 1 TABLET BY MOUTH  TWICE DAILY  . glucose blood (ONETOUCH VERIO) test strip CHECK FINGERSTICK BLOOD SUGARS THREE TIMES A WEEK  . lisinopril (ZESTRIL) 5 MG tablet Take 1 tablet (5 mg total) by mouth daily. for blood pressure  . LYSINE PO Take by mouth as needed.  . Melatonin 2.5 MG CHEW Chew 2.5 mg by mouth daily as needed.  . metFORMIN (GLUCOPHAGE-XR) 500 MG 24 hr tablet Take 1 tablet (500 mg total) by mouth daily with breakfast.  . Multiple Vitamins-Minerals (CENTRUM SILVER 50+WOMEN) TABS Take 1 tablet by mouth daily.  Glory Rosebush Delica Lancets 84Z MISC 1 application by Does not apply route 3 (three) times a week. Dx:E11.9 LON:99 check BS 3x per week  . OVER THE COUNTER MEDICATION neuriva plus OTC supplement (like prevagen)  . Probiotic Product (TRUBIOTICS) CAPS Take 1 capsule by mouth daily.  . pseudoephedrine (SUDAFED) 30 MG tablet Take 1 tablet (30 mg total) by mouth every 8 (eight) hours as needed for congestion. No more than 3 tablets/month  . simvastatin (ZOCOR) 40 MG tablet Take 1 tablet (40 mg total) by mouth at bedtime.  Marland Kitchen aspirin EC 81 MG tablet Take 81 mg by mouth at bedtime.  . clotrimazole-betamethasone (LOTRISONE) cream Apply externally BID PRN (Patient not taking: Reported on 07/08/2020)  . [DISCONTINUED] Lancets (ACCU-CHEK SOFT TOUCH) lancets Check fingerstick blood sugars three times a week; Dx: E11.9, LON:99 months  . [  DISCONTINUED] PREVIDENT 5000 PLUS 1.1 % CREA dental cream Place 1 application onto teeth daily as needed.    No facility-administered encounter medications on file as of 07/08/2020.    Allergies (verified) Patient has no known allergies.   History: Past Medical History:  Diagnosis Date  . Aortic atherosclerosis ([place])    a. 11/2017 noted on Chest CT - Aortic and branch vessel atherosclerosis.  . Atrophic vaginitis   . Chest pain    a. 06/2013 ETT: Ex time 6:00, Max HR 136 (87%), 7.1 METS. No  ECG changes; b. 07/2018 MV: Ex time: 6:42, nl EF, No ischemia.  Marland Kitchen COPD (chronic obstructive pulmonary disease) ([place])    a. 11/2017 Chest CT: moderate centrilobular emphysema.  . Diabetes mellitus without complication ([place])   . Diverticulitis   . DNAR (do not attempt resuscitation) 06/09/2017   Discussed today, 06/09/2017  . Essential hypertension   . GERD (gastroesophageal reflux disease)   . History of kidney stones   . History of tobacco use   . Hyperlipidemia   . IFG (impaired fasting glucose)   . Irregular heart beat 2018  . Leukorrhea, not specified as infective   . Osteopenia 05/27/2016   August 2017  . Personal history of tobacco use, presenting hazards to health 08/20/2015  . Pulmonary nodules    a. 11/2017 CT Chest: No change in bilat pulm nodules, including an ant LUL nodule - 5.58m.   Past Surgical History:  Procedure Laterality Date  . colonoscopy    . COLONOSCOPY WITH PROPOFOL N/A 02/20/2018   Procedure: COLONOSCOPY WITH PROPOFOL;  Surgeon: WLucilla Lame MD;  [facility]  Service: Endoscopy;  Laterality: N/A;  Diabetic  . CYSTOSCOPY WITH BIOPSY N/A 01/17/2017   Procedure: CYSTOSCOPY WITH BIOPSY;  Surgeon: AHollice Espy MD;  Location: [facility];  Service: Urology;  Laterality: N/A;  . POLYPECTOMY  02/20/2018   Procedure: POLYPECTOMY;  Surgeon: WLucilla Lame MD;  [facility]  Service: Endoscopy;;  . TMJ ARTHROPLASTY  1984   Family History  Problem Relation Age of Onset  . Stroke Mother   . Hypertension Mother   . Hyperlipidemia Mother   . Diabetes Mother        pre-diabetic  . Hearing loss Mother   . Diabetes Brother   . Irregular heart beat Brother   . COPD Father   . Hearing loss Father   . Thyroid disease Sister   . Stroke Maternal Grandmother   . Hyperlipidemia Maternal Grandmother   . Hypertension Maternal Grandmother   . Cancer Maternal Grandfather        bone  . Cancer Sister        cystic fibroid carcinoma  .  Ulcerative colitis Sister   . Heart disease Paternal Uncle   . Heart disease Paternal Uncle   . Hyperlipidemia Maternal Aunt   . Stroke Maternal Aunt   . Breast cancer Neg Hx   . Hematuria Neg Hx   . Prostate cancer Neg Hx   . Renal cancer Neg Hx    Social History   Socioeconomic History  . Marital status: Widowed    Spouse name: SCharlyne Mom . Number of children: 0  . Years of education: some college  . Highest education level: 12th grade  Occupational History    Employer: [employer]  Tobacco Use  . Smoking status: Former Smoker    Packs/day: 3.00    Years: 45.00    Pack years: 135.00  Types: Cigarettes    Quit date: 10/25/2009    Years since quitting: 10.7  . Smokeless tobacco: Never Used  . Tobacco comment: Quit before my husband was diagnosed with small cell cancer.  Vaping Use  . Vaping Use: Never used  Substance and Sexual Activity  . Alcohol use: Not Currently    Alcohol/week: 0.0 standard drinks    Comment: Rarely  . Drug use: No  . Sexual activity: Not Currently    Birth control/protection: None  Other Topics Concern  . Not on file  Social History Narrative  . Not on file   Social Determinants of Health   Financial Resource Strain: Low Risk   . Difficulty of Paying Living Expenses: Not hard at all  Food Insecurity: No Food Insecurity  . Worried About Charity fundraiser in the Last Year: Never true  . Ran Out of Food in the Last Year: Never true  Transportation Needs: No Transportation Needs  . Lack of Transportation (Medical): No  . Lack of Transportation (Non-Medical): No  Physical Activity: Insufficiently Active  . Days of Exercise per Week: 2 days  . Minutes of Exercise per Session: 60 min  Stress: No Stress Concern Present  . Feeling of Stress : Not at all  Social Connections: Socially Isolated  . Frequency of Communication with Friends and Family: More than three times a week  . Frequency of Social Gatherings with Friends  and Family: Three times a week  . Attends Religious Services: Never  . Active Member of Clubs or Organizations: No  . Attends Archivist Meetings: Never  . Marital Status: Widowed    Tobacco Counseling Counseling given: Not Answered Comment: Quit before my husband was diagnosed with small cell cancer.   Clinical Intake:  Pre-visit preparation completed: Yes  Pain : No/denies pain     BMI - recorded: 27.19 Nutritional Status: BMI 25 -29 Overweight Nutritional Risks: None Diabetes: Yes CBG done?: No Did pt. bring in CBG monitor from home?: No  How often do you need to have someone help you when you read instructions, pamphlets, or other written materials from your doctor or pharmacy?: 1 - Never  Nutrition Risk Assessment:  Has the patient had any N/V/D within the last 2 months?  No  Does the patient have any non-healing wounds?  No  Has the patient had any unintentional weight loss or weight gain?  No   Diabetes:  Is the patient diabetic?  Yes  If diabetic, was a CBG obtained today?  No  Did the patient bring in their glucometer from home?  No  How often do you monitor your CBG's? 3 times per week.   Financial Strains and Diabetes Management:  Are you having any financial strains with the device, your supplies or your medication? No .  Does the patient want to be seen by Chronic Care Management for management of their diabetes?  No  Would the patient like to be referred to a Nutritionist or for Diabetic Management?  No   Diabetic Exams:  Diabetic Eye Exam: Completed 05/31/19. Overdue for diabetic eye exam. Pt has been advised about the importance in completing this exam.  Diabetic Foot Exam: Completed 02/15/20.  Interpreter Needed?: No  Information entered by :: Clemetine Marker LPN   Activities of Daily Living In your present state of health, do you have any difficulty performing the following activities: 07/08/2020 02/15/2020  Hearing? Y N  Comment  declines hearing aids -  Vision? N  N  Difficulty concentrating or making decisions? N N  Walking or climbing stairs? N N  Dressing or bathing? N N  Doing errands, shopping? N N  Preparing Food and eating ? N -  Using the Toilet? N -  In the past six months, have you accidently leaked urine? N -  Do you have problems with loss of bowel control? N -  Managing your Medications? N -  Managing your Finances? N -  Housekeeping or managing your Housekeeping? N -  Some recent data might be hidden    Patient Care Team: [name], PA-C as PCP - General (Family Medicine) Rockey Situ Kathlene November, MD as PCP - Cardiology (Cardiology) Oneta Rack, MD as Consulting Physician (Dermatology) Minna Merritts, MD as Consulting Physician (Cardiology) Lucilla Lame, MD as Consulting Physician (Gastroenterology) Verdia Kuba, [facility] (Pharmacist)  Indicate any recent Medical Services you may have received from other than [facility] providers in the past year (date may be approximate).     Assessment:   This is a routine wellness examination for [place].  Hearing/Vision screen  Hearing Screening   '125Hz'$  $Remo'250Hz'Xinbn$'500Hz'$'1000Hz'$'2000Hz'$'3000Hz'$'4000Hz'$'6000Hz'$'8000Hz'$   Right ear:           Left ear:           Comments: Pt c/o mild hearing difficulty but states hearing evaluation stated she does not need hearing aids   Vision Screening Comments: Annual vision screenings at [facility] Dr. Mallie Mussel due for exam   Dietary issues and exercise activities discussed: Current Exercise Habits: Structured exercise class, Type of exercise: calisthenics, Time (Minutes): 60, Frequency (Times/Week): 2, Weekly Exercise (Minutes/Week): 120, Intensity: Moderate, Exercise limited by: None identified  Goals    . Chronic Care Management     CARE PLAN ENTRY  Current Barriers:  . Chronic Disease Management support, education, and care coordination needs related to Hyperlipidemia, Diabetes, and Osteopenia    Hyperlipidemia . Pharmacist Clinical Goal(s): o Over the next 90 days, patient will work with PharmD and providers to maintain LDL goal < 70 . Current regimen:  o Simvastatin $RemoveBefor'40mg'oGKdGMXPBREC$  daily . Interventions: o Increased CoQ10 . Patient self care activities - Over the next 90 days, patient will: o Continue taking simvastatin at night  Diabetes . Pharmacist Clinical Goal(s): o Over the next 90 days, patient will work with PharmD and providers to maintain A1c goal <7% . Current regimen:  o Metformin $RemoveBef'500mg'eSqctsxoWL$  daily, cinnamon . Interventions: o Restart probiotic o Did not start Activia yogurt with breakfast daily . Patient self care activities - Over the next 90 days, patient will: o Check blood sugar once daily, document, and provide at future appointments o Contact provider with any episodes of hypoglycemia o Substitute live culture yogurt for probiotic o Consider stopping cinnamon due to little long term benefit and some cost  Osteopenia . Pharmacist Clinical Goal(s) o Over the next 90 days, patient will work with PharmD and providers to increase Calcium and Vitamin D intake . Current regimen:  o Milk with every meal . Interventions: o Take Viactive daily o Each yogurt with breakfast daily . Patient self care activities - Over the next 90 days, patient will: o Increase calcium and vitamin D from dietary sources  Medication management . Pharmacist Clinical Goal(s): o Over the next 90 days, patient will work with PharmD and providers to maintain optimal medication adherence . Current pharmacy: [facility] . Interventions o Comprehensive medication review performed. o Continue current medication management  strategy . Patient self care activities - Over the next 90 days, patient will: o Focus on medication adherence by continuing current practices o Take medications as prescribed o Report any questions or concerns to PharmD and/or provider(s)  Initial goal documentation     . DIET -  INCREASE WATER INTAKE     Recommend to drink at least 6-8 8oz glasses of water per day.      Depression Screen PHQ 2/9 Scores 07/08/2020 02/15/2020 10/02/2019 07/03/2019 03/22/2019 03/06/2019 09/05/2018  PHQ - 2 Score 0 0 0 0 0 0 0  PHQ- 9 Score - 0 0 - 0 0 0    Fall Risk Fall Risk  07/08/2020 02/15/2020 10/02/2019 07/03/2019 03/22/2019  Falls in the past year? 0 0 0 0 1  Comment - - - - -  Number falls in past yr: 0 0 0 0 0  Injury with Fall? 0 0 0 0 0  Risk for fall due to : No Fall Risks - - - -  Risk for fall due to: Comment - - - - -  Follow up Falls prevention discussed - Falls evaluation completed Falls prevention discussed Falls evaluation completed    Any stairs in or around the home? Yes  If so, are there any without handrails? No  Home free of loose throw rugs in walkways, pet beds, electrical cords, etc? Yes  Adequate lighting in your home to reduce risk of falls? Yes   ASSISTIVE DEVICES UTILIZED TO PREVENT FALLS:  Life alert? No  Use of a cane, walker or w/c? No  Grab bars in the bathroom? Yes  Shower chair or bench in shower? No  Elevated toilet seat or a handicapped toilet? No   TIMED UP AND GO:  Was the test performed? Yes .  Length of time to ambulate 10 feet: 5 sec.   Gait steady and fast without use of assistive device  Cognitive Function:     6CIT Screen 07/08/2020 07/03/2019 06/29/2018  What Year? 0 points 0 points 0 points  What month? 0 points 0 points 0 points  What time? 0 points 0 points 0 points  Count back from 20 0 points 0 points 0 points  Months in reverse 0 points 0 points 0 points  Repeat phrase 0 points 4 points 0 points  Total Score 0 4 0    Immunizations Immunization History  Administered Date(s) Administered  . Fluad Quad(high Dose 65+) 07/08/2020  . Influenza, High Dose Seasonal PF 06/12/2017, 06/29/2018, 06/13/2019  . Influenza,inj,Quad PF,6+ Mos 08/22/2015  . Influenza-Unspecified 06/15/2017  . PFIZER SARS-COV-2 Vaccination  12/16/2019, 01/08/2020  . Pneumococcal Conjugate-13 06/09/2017  . Pneumococcal Polysaccharide-23 11/27/2015  . Td 07/11/2019  . Tdap 10/25/2008, 07/11/2019  . Zoster 02/27/2016  . Zoster Recombinat (Shingrix) 07/11/2019, 12/05/2019    TDAP status: Up to date   Flu Vaccine status: Completed at today's visit   Pneumococcal vaccine status: Up to date   Covid-19 vaccine status: Completed vaccines  Qualifies for Shingles Vaccine? Yes   Zostavax completed Yes   Shingrix Completed?: Yes  Screening Tests Health Maintenance  Topic Date Due  . OPHTHALMOLOGY EXAM  05/30/2020  . MAMMOGRAM  08/13/2020  . HEMOGLOBIN A1C  08/16/2020  . PNA vac Low Risk Adult (2 of 2 - PPSV23) 11/26/2020  . FOOT EXAM  02/14/2021  . DEXA SCAN  08/13/2021  . COLONOSCOPY  02/21/2028  . TETANUS/TDAP  07/10/2029  . INFLUENZA VACCINE  Completed  . COVID-19 Vaccine  Completed  .  Hepatitis C Screening  Completed    Health Maintenance  Health Maintenance Due  Topic Date Due  . OPHTHALMOLOGY EXAM  05/30/2020    Colorectal cancer screening: Completed 02/20/18. Repeat every 10 years   Mammogram status: Completed 08/14/19. Repeat every year   Bone Density status: Completed 08/14/19. Results reflect: Bone density results: OSTEOPENIA. Repeat every 2 years.  Lung Cancer Screening: (Low Dose CT Chest recommended if Age 86-80 years, 30 pack-year currently smoking OR have quit w/in 15years.) does qualify. Scheduled for 07/17/20.  Additional Screening:  Hepatitis C Screening: does qualify; Completed 06/24/20  Vision Screening: Recommended annual ophthalmology exams for early detection of glaucoma and other disorders of the eye. Is the patient up to date with their annual eye exam?  No  Who is the provider or what is the name of the office in which the patient attends annual eye exams? Dr. Mallie Mussel  Dental Screening: Recommended annual dental exams for proper oral hygiene  Community Resource Referral / Chronic  Care Management: CRR required this visit?  No   CCM required this visit?  No      Plan:     I have personally reviewed and noted the following in the patient's chart:   . Medical and social history . Use of alcohol, tobacco or illicit drugs  . Current medications and supplements . Functional ability and status . Nutritional status . Physical activity . Advanced directives . List of other physicians . Hospitalizations, surgeries, and ER visits in previous 12 months . Vitals . Screenings to include cognitive, depression, and falls . Referrals and appointments  In addition, I have reviewed and discussed with patient certain preventive protocols, quality metrics, and best practice recommendations. A written personalized care plan for preventive services as well as general preventive health recommendations were provided to patient.     Clemetine Marker, LPN   [id number]/00/[id number]   Nurse Notes: pt c/o vaginal itching and irritation.  Pt advised to schedule appt with [name] [facility] to discuss treatment and/or referral to GYN.   Pt to have virtual visit with Leisa today at 1:40.

## 2020-07-08 NOTE — Progress Notes (Signed)
Name: Felicia Cisneros   MRN: [id number]    DOB: June 02, 1951   Date:07/08/2020       Progress Note  Subjective:    Chief Complaint  Chief Complaint  Patient presents with  . Vaginal Itching    pt states occurs speradically last about 2 days then goes away.  Pt denies discharge or rash    I connected with  [name]  on 07/08/20 at  1:40 PM EDT by a video enabled telemedicine application and verified that I am speaking with the correct person using two identifiers.  I discussed the limitations of evaluation and management by telemedicine and the availability of in person appointments. The patient expressed understanding and agreed to proceed with virtual visit today. Staff also discussed with the patient that there may be a patient responsible charge related to this service.  Patient Location: Home Provider Location: [facility]  Additional Individuals present: none  HPI    Pt presents with genital irritation, she has had this on and off for several months - she briefly mentioned it at last routine OV in April - but did not want to be examined.   She complains of vulvu vaginal itching intermittent, recurrent and improved with Vaginal antifungal wipes - miconazole  Some itching after she urinates. Hx of atrophic vaginitis She notes years ago not being able to have sex any more due to pain She has DM which is well controlled She denies dysuria, hematuria, genital rash or lesions, abd sx She denies any hx of breast CA, uterine CA or ovarian CA She has tx sx in the past with lotrisone  Patient Active Problem List   Diagnosis Date Noted  . GERD without esophagitis 03/06/2019  . Benign neoplasm of sigmoid colon   . Lower extremity edema 01/31/2018  . Centrilobular emphysema ([place]) 12/02/2016  . Pulmonary nodules/lesions, multiple 12/02/2016  . Aortic atherosclerosis ([place]) 07/08/2016  . Osteopenia 05/27/2016  . Abnormality of nail tissue 11/27/2015  . Herpes  simplex antibody positive 08/22/2015  . Essential hypertension   . Type 2 diabetes mellitus, controlled ([place])   . Atrophic vaginitis   . History of tobacco use   . Abnormal EKG 07/13/2013  . Hyperlipidemia 07/13/2013    Social History   Tobacco Use  . Smoking status: Former Smoker    Packs/day: 3.00    Years: 45.00    Pack years: 135.00    Types: Cigarettes    Quit date: 10/25/2009    Years since quitting: 10.7  . Smokeless tobacco: Never Used  . Tobacco comment: Quit before my husband was diagnosed with small cell cancer.  Substance Use Topics  . Alcohol use: Not Currently    Alcohol/week: 0.0 standard drinks    Comment: Rarely     Current Outpatient Medications:  .  acetaminophen (TYLENOL) 500 MG tablet, Take 1,000 mg by mouth every 6 (six) hours as needed (for pain.)., Disp: , Rfl:  .  aspirin EC 81 MG tablet, Take 81 mg by mouth at bedtime., Disp: , Rfl:  .  BIOTIN PO, Take by mouth daily., Disp: , Rfl:  .  Blood Glucose Monitoring Suppl (ONETOUCH VERIO FLEX SYSTEM) w/Device KIT, Check blood sugars up to twice daily prn Dx:E11.9 LON:99, Disp: 1 kit, Rfl: 0 .  Calcium-Vitamin D-Vitamin K (VIACTIV CALCIUM PLUS D) 650-12.5-40 MG-MCG-MCG CHEW, Chew 1 [place] by mouth in the morning and at bedtime., Disp: , Rfl:  .  CINNAMON PO, Take 1,000 mg by mouth  2 (two) times daily., Disp: , Rfl:  .  clotrimazole-betamethasone (LOTRISONE) cream, Apply externally BID PRN (Patient not taking: Reported on 07/08/2020), Disp: 15 g, Rfl: 1 .  Coenzyme Q10 (COQ10) 100 MG CAPS, One by mouth daily, Disp: , Rfl:  .  [START ON 07/10/2020] conjugated estrogens (PREMARIN) vaginal cream, Place 1 Applicatorful vaginally 2 (two) times a week., Disp: 42.5 g, Rfl: 3 .  famotidine (PEPCID) 20 MG tablet, TAKE 1 TABLET BY MOUTH  TWICE DAILY, Disp: 180 tablet, Rfl: 3 .  glucose blood (ONETOUCH VERIO) test strip, CHECK FINGERSTICK BLOOD SUGARS THREE TIMES A WEEK, Disp: 100 each, Rfl: 3 .  lisinopril (ZESTRIL) 5 MG  tablet, Take 1 tablet (5 mg total) by mouth daily. for blood pressure, Disp: 90 tablet, Rfl: 3 .  LYSINE PO, Take by mouth as needed., Disp: , Rfl:  .  Melatonin 2.5 MG CHEW, Chew 2.5 mg by mouth daily as needed., Disp: , Rfl:  .  metFORMIN (GLUCOPHAGE-XR) 500 MG 24 hr tablet, Take 1 tablet (500 mg total) by mouth daily with breakfast., Disp: 90 tablet, Rfl: 3 .  Multiple Vitamins-Minerals (CENTRUM SILVER 50+WOMEN) TABS, Take 1 tablet by mouth daily., Disp: , Rfl:  .  OneTouch Delica Lancets 25K MISC, 1 application by Does not apply route 3 (three) times a week. Dx:E11.9 LON:99 check BS 3x per week, Disp: 100 each, Rfl: 5 .  OVER THE COUNTER MEDICATION, neuriva plus OTC supplement (like prevagen), Disp: , Rfl:  .  Probiotic Product (TRUBIOTICS) CAPS, Take 1 capsule by mouth daily., Disp: , Rfl:  .  pseudoephedrine (SUDAFED) 30 MG tablet, Take 1 tablet (30 mg total) by mouth every 8 (eight) hours as needed for congestion. No more than 3 tablets/month, Disp: 30 tablet, Rfl: 0 .  simvastatin (ZOCOR) 40 MG tablet, Take 1 tablet (40 mg total) by mouth at bedtime., Disp: 90 tablet, Rfl: 3  No Known Allergies I personally reviewed active problem list, medication list, allergies, family history, social history, health maintenance, notes from last encounter, lab results, imaging with the patient/caregiver today.   Review of Systems  10 Systems reviewed and are negative for acute change except as noted in the HPI.   Objective:   Virtual encounter, vitals limited, only able to obtain the following Today's Vitals   07/08/20 1021  BP: 108/76  Pulse: 84  Resp: 14  Temp: 97.7 F (36.5 C)  SpO2: 96%  Weight: 178 lb (80.7 kg)  Height: 5' 8"  (1.727 m)   Body mass index is 27.06 kg/m. Nursing Note and Vital Signs reviewed.  Physical Exam Vitals and nursing note reviewed.  Constitutional:      General: She is not in acute distress.    Appearance: She is not toxic-appearing or diaphoretic.      Comments: Well appearing elderly female   Neurological:     Mental Status: She is alert.  Psychiatric:        Mood and Affect: Mood normal.     PE limited by telephone encounter  No results found for this or any previous visit (from the past 72 hour(s)).  Assessment and Plan:     ICD-10-CM   1. Subacute vaginitis  N76.1 conjugated estrogens (PREMARIN) vaginal cream   yeast vaginitis vs atrophic vaginitis sx?  trial of premarin cream and pt can continue otc antifungal tx/wipes  2. Atrophic vaginitis  N95.2 conjugated estrogens (PREMARIN) vaginal cream   Can continue to use OTC antiyeast/fungal wipes  Sx have been brief -  not lasting more than a day, do not feel like she needs diflucan at this point.  Start vaginal estrogen cream, f/up in 3-4 weeks if sx not improving - will need exam and likely some testing  Discussed with pt    -Red flags and when to present for emergency care or RTC including fever >101.74F, chest pain, shortness of breath, new/worsening/un-resolving symptoms, reviewed with patient at time of visit. Follow up and care instructions discussed and provided in AVS. - I discussed the assessment and treatment plan with the patient. The patient was provided an opportunity to ask questions and all were answered. The patient agreed with the plan and demonstrated an understanding of the instructions.  I provided 20+ minutes of non-face-to-face time during this encounter.  [name], PA-C 07/08/20 2:24 PM

## 2020-07-08 NOTE — Patient Instructions (Signed)
Felicia Cisneros , Thank you for taking time to come for your Medicare Wellness Visit. I appreciate your ongoing commitment to your health goals. Please review the following plan we discussed and let me know if I can assist you in the future.   Screening recommendations/referrals: Colonoscopy: done 02/20/18 Mammogram: done 08/14/19. Please call [phone] to schedule your mammogram.  Bone Density: done 08/14/19.  Recommended yearly ophthalmology/optometry visit for glaucoma screening and checkup Recommended yearly dental visit for hygiene and checkup  Vaccinations: Influenza vaccine: done today Pneumococcal vaccine: done 06/09/17 Tdap vaccine: done 07/11/19 Shingles vaccine: done 07/11/19 & 12/05/19   Covid-19: done 12/16/19 & 01/08/20  Advanced directives: Please bring a copy of your health care power of attorney and living will to the office at your convenience.  Conditions/risks identified: Recommend increasing water intake to 6-8 glasses per day  Next appointment: Follow up in one year for your annual wellness visit    Preventive Care 65 Years and Older, Female Preventive care refers to lifestyle choices and visits with your health care provider that can promote health and wellness. What does preventive care include?  A yearly physical exam. This is also called an annual well check.  Dental exams once or twice a year.  Routine eye exams. Ask your health care provider how often you should have your eyes checked.  Personal lifestyle choices, including:  Daily care of your teeth and gums.  Regular physical activity.  Eating a healthy diet.  Avoiding tobacco and drug use.  Limiting alcohol use.  Practicing safe sex.  Taking low-dose aspirin every day.  Taking vitamin and mineral supplements as recommended by your health care provider. What happens during an annual well check? The services and screenings done by your health care provider during your annual well check will  depend on your age, overall health, lifestyle risk factors, and family history of disease. Counseling  Your health care provider may ask you questions about your:  Alcohol use.  Tobacco use.  Drug use.  Emotional well-being.  Home and relationship well-being.  Sexual activity.  Eating habits.  History of falls.  Memory and ability to understand (cognition).  Work and work Statistician.  Reproductive health. Screening  You may have the following tests or measurements:  Height, weight, and BMI.  Blood pressure.  Lipid and cholesterol levels. These may be checked every 5 years, or more frequently if you are over 56 years old.  Skin check.  Lung cancer screening. You may have this screening every year starting at age 83 if you have a 30-pack-year history of smoking and currently smoke or have quit within the past 15 years.  Fecal occult blood test (FOBT) of the stool. You may have this test every year starting at age 40.  Flexible sigmoidoscopy or colonoscopy. You may have a sigmoidoscopy every 5 years or a colonoscopy every 10 years starting at age 49.  Hepatitis C blood test.  Hepatitis B blood test.  Sexually transmitted disease (STD) testing.  Diabetes screening. This is done by checking your blood sugar (glucose) after you have not eaten for a while (fasting). You may have this done every 1-3 years.  Bone density scan. This is done to screen for osteoporosis. You may have this done starting at age 84.  Mammogram. This may be done every 1-2 years. Talk to your health care provider about how often you should have regular mammograms. Talk with your health care provider about your test results, treatment options, and if necessary,  the need for more tests. Vaccines  Your health care provider may recommend certain vaccines, such as:  Influenza vaccine. This is recommended every year.  Tetanus, diphtheria, and acellular pertussis (Tdap, Td) vaccine. You may need a  Td booster every 10 years.  Zoster vaccine. You may need this after age 69.  Pneumococcal 13-valent conjugate (PCV13) vaccine. One dose is recommended after age 56.  Pneumococcal polysaccharide (PPSV23) vaccine. One dose is recommended after age 3. Talk to your health care provider about which screenings and vaccines you need and how often you need them. This information is not intended to replace advice given to you by your health care provider. Make sure you discuss any questions you have with your health care provider. Document Released: 11/07/2015 Document Revised: 06/30/2016 Document Reviewed: 08/12/2015 Elsevier Interactive Patient Education  2017 [employer] Prevention in the Home Falls can cause injuries. They can happen to people of all ages. There are many things you can do to make your home safe and to help prevent falls. What can I do on the outside of my home?  Regularly fix the edges of walkways and driveways and fix any cracks.  Remove anything that might make you trip as you walk through a door, such as a raised step or threshold.  Trim any bushes or trees on the path to your home.  Use bright outdoor lighting.  Clear any walking paths of anything that might make someone trip, such as rocks or tools.  Regularly check to see if handrails are loose or broken. Make sure that both sides of any steps have handrails.  Any raised decks and porches should have guardrails on the edges.  Have any leaves, snow, or ice cleared regularly.  Use sand or salt on walking paths during winter.  Clean up any spills in your garage right away. This includes oil or grease spills. What can I do in the bathroom?  Use night lights.  Install grab bars by the toilet and in the tub and shower. Do not use towel bars as grab bars.  Use non-skid mats or decals in the tub or shower.  If you need to sit down in the shower, use a plastic, non-slip stool.  Keep the floor dry. Clean  up any water that spills on the floor as soon as it happens.  Remove soap buildup in the tub or shower regularly.  Attach bath mats securely with double-sided non-slip rug tape.  Do not have throw rugs and other things on the floor that can make you trip. What can I do in the bedroom?  Use night lights.  Make sure that you have a light by your bed that is easy to reach.  Do not use any sheets or blankets that are too big for your bed. They should not hang down onto the floor.  Have a firm chair that has side arms. You can use this for support while you get dressed.  Do not have throw rugs and other things on the floor that can make you trip. What can I do in the kitchen?  Clean up any spills right away.  Avoid walking on wet floors.  Keep items that you use a lot in easy-to-reach places.  If you need to reach something above you, use a strong step stool that has a grab bar.  Keep electrical cords out of the way.  Do not use floor polish or wax that makes floors slippery. If you must use  wax, use non-skid floor wax.  Do not have throw rugs and other things on the floor that can make you trip. What can I do with my stairs?  Do not leave any items on the stairs.  Make sure that there are handrails on both sides of the stairs and use them. Fix handrails that are broken or loose. Make sure that handrails are as long as the stairways.  Check any carpeting to make sure that it is firmly attached to the stairs. Fix any carpet that is loose or worn.  Avoid having throw rugs at the top or bottom of the stairs. If you do have throw rugs, attach them to the floor with carpet tape.  Make sure that you have a light switch at the top of the stairs and the bottom of the stairs. If you do not have them, ask someone to add them for you. What else can I do to help prevent falls?  Wear shoes that:  Do not have high heels.  Have rubber bottoms.  Are comfortable and fit you well.  Are  closed at the toe. Do not wear sandals.  If you use a stepladder:  Make sure that it is fully opened. Do not climb a closed stepladder.  Make sure that both sides of the stepladder are locked into place.  Ask someone to hold it for you, if possible.  Clearly mark and make sure that you can see:  Any grab bars or handrails.  First and last steps.  Where the edge of each step is.  Use tools that help you move around (mobility aids) if they are needed. These include:  Canes.  Walkers.  Scooters.  Crutches.  Turn on the lights when you go into a dark area. Replace any light bulbs as soon as they burn out.  Set up your furniture so you have a clear path. Avoid moving your furniture around.  If any of your floors are uneven, fix them.  If there are any pets around you, be aware of where they are.  Review your medicines with your doctor. Some medicines can make you feel dizzy. This can increase your chance of falling. Ask your doctor what other things that you can do to help prevent falls. This information is not intended to replace advice given to you by your health care provider. Make sure you discuss any questions you have with your health care provider. Document Released: 08/07/2009 Document Revised: 03/18/2016 Document Reviewed: 11/15/2014 Elsevier Interactive Patient Education  2017 [employer].

## 2020-07-08 NOTE — Patient Instructions (Signed)
You can continue your over the counter meds (antifungal/antiyeast meds -azoles in creams, suppositories or your wipes)  Try starting vaginal estrogen creams/suppositories for your symptoms and history of atrophic vaginitis.  See info below  You can start treatment 2x a week.    Call and get follow up appointment if you do not feel that symptoms are improving. Vaginal moisturizers and lubricants also help with symptoms.   Atrophic Vaginitis Atrophic vaginitis is a condition in which the tissues that line the vagina become dry and thin. This condition occurs in women who have stopped having their period. It is caused by a drop in a female hormone (estrogen). This hormone helps:  To keep the vagina moist.  To make a clear fluid. This clear fluid helps: ? To make the vagina ready for sex. ? To protect the vagina from infection. If the lining of the vagina is dry and thin, it may cause irritation, burning, or itchiness. It may also:  Make sex painful.  Make an exam of your vagina painful.  Cause bleeding.  Make you lose interest in sex.  Cause a burning feeling when you pee (urinate).  Cause a brown or yellow fluid to come from your vagina. Some women do not have symptoms. Follow these instructions at home: Medicines  Take over-the-counter and prescription medicines only as told by your doctor.  Do not use herbs or other medicines unless your doctor says it is okay.  Use medicines for for dryness. These include: ? Oils to make the vagina soft. ? Creams. ? Moisturizers. General instructions  Do not douche.  Do not use products that can make your vagina dry. These include: ? Scented sprays. ? Scented tampons. ? Scented soaps.  Sex can help increase blood flow and soften the tissue in the vagina. If it hurts to have sex: ? Tell your partner. ? Use products to make sex more comfortable. Use these only as told by your doctor. Contact a doctor if you:  Have discharge  from the vagina that is different than usual.  Have a bad smell coming from your vagina.  Have new symptoms.  Do not get better.  Get worse. Summary  Atrophic vaginitis is a condition in which the lining of the vagina becomes dry and thin.  This condition affects women who have stopped having their periods.  Treatment may include using products that help make the vagina soft.  Call a doctor if do not get better with treatment. This information is not intended to replace advice given to you by your health care provider. Make sure you discuss any questions you have with your health care provider. Document Revised: 10/24/2017 Document Reviewed: 10/24/2017 Elsevier Patient Education  [place].   Vaginitis  Vaginitis is irritation and swelling (inflammation) of the vagina. It happens when normal bacteria and yeast in the vagina grow too much. There are many types of this condition. Treatment will depend on the type you have. Follow these instructions at home: Lifestyle  Keep your vagina area clean and dry. ? Avoid using soap. ? Rinse the area with water.  Do not do the following until your doctor says it is okay: ? Wash and clean out the vagina (douche). ? Use tampons. ? Have sex.  Wipe from front to back after going to the bathroom.  Let air reach your vagina. ? Wear cotton underwear. ? Do not wear:  Underwear while you sleep.  Tight pants.  Thong underwear.  Underwear or nylons without  a cotton panel. ? Take off any wet clothing, such as bathing suits, as soon as possible.  Use gentle, non-scented products. Do not use things that can irritate the vagina, such as fabric softeners. Avoid the following products if they are scented: ? Feminine sprays. ? Detergents. ? Tampons. ? Feminine hygiene products. ? Soaps or bubble baths.  Practice safe sex and use condoms. General instructions  Take over-the-counter and prescription medicines only as told by  your doctor.  If you were prescribed an antibiotic medicine, take or use it as told by your doctor. Do not stop taking or using the antibiotic even if you start to feel better.  Keep all follow-up visits as told by your doctor. This is important. Contact a doctor if:  You have pain in your belly.  You have a fever.  Your symptoms last for more than 2-3 days. Get help right away if:  You have a fever and your symptoms get worse all of a sudden. Summary  Vaginitis is irritation and swelling of the vagina. It can happen when the normal bacteria and yeast in the vagina grow too much. There are many types.  Treatment will depend on the type you have.  Do not douche, use tampons , or have sex until your health care provider approves. When you can return to sex, practice safe sex and use condoms. This information is not intended to replace advice given to you by your health care provider. Make sure you discuss any questions you have with your health care provider. Document Revised: 09/23/2017 Document Reviewed: 11/02/2016 Elsevier Patient Education  2020 [employer].

## 2020-07-10 ENCOUNTER — Telehealth: Payer: Medicare Other | Admitting: Family Medicine

## 2020-07-17 ENCOUNTER — Ambulatory Visit
Admission: RE | Admit: 2020-07-17 | Discharge: 2020-07-17 | Disposition: A | Discharge status: Home / Self Care | Payer: Medicare Other | Source: Ambulatory Visit | Attending: Oncology | Admitting: Oncology

## 2020-07-17 ENCOUNTER — Other Ambulatory Visit: Payer: Self-pay

## 2020-07-17 DIAGNOSIS — Z122 Encounter for screening for malignant neoplasm of respiratory organs: Secondary | ICD-10-CM | POA: Diagnosis present

## 2020-07-17 DIAGNOSIS — Z87891 Personal history of nicotine dependence: Secondary | ICD-10-CM | POA: Diagnosis present

## 2020-07-18 ENCOUNTER — Encounter: Payer: Self-pay | Admitting: Family Medicine

## 2020-07-19 ENCOUNTER — Encounter: Payer: Self-pay | Admitting: Family Medicine

## 2020-07-19 DIAGNOSIS — N761 Subacute and chronic vaginitis: Secondary | ICD-10-CM

## 2020-07-19 DIAGNOSIS — N952 Postmenopausal atrophic vaginitis: Secondary | ICD-10-CM

## 2020-07-20 ENCOUNTER — Encounter: Payer: Self-pay | Admitting: *Deleted

## 2020-07-21 MED ORDER — PREMARIN 0.625 MG/GM VA CREA: 1.0000 | TOPICAL_CREAM | VAGINAL | 3 refills | Status: DC

## 2020-07-23 ENCOUNTER — Other Ambulatory Visit: Payer: Self-pay

## 2020-07-23 DIAGNOSIS — E782 Mixed hyperlipidemia: Secondary | ICD-10-CM

## 2020-07-23 DIAGNOSIS — I700 Atherosclerosis of aorta: Secondary | ICD-10-CM

## 2020-07-29 MED ORDER — SIMVASTATIN 40 MG PO TABS: 40.0000 mg | ORAL_TABLET | Freq: Every day | ORAL | 3 refills | Status: DC

## 2020-08-04 ENCOUNTER — Encounter: Payer: Self-pay | Admitting: Family Medicine

## 2020-08-05 ENCOUNTER — Other Ambulatory Visit: Payer: Self-pay

## 2020-08-05 ENCOUNTER — Encounter: Payer: Self-pay | Admitting: Family Medicine

## 2020-08-05 DIAGNOSIS — N952 Postmenopausal atrophic vaginitis: Secondary | ICD-10-CM

## 2020-08-15 ENCOUNTER — Encounter: Payer: Medicare Other | Admitting: Obstetrics and Gynecology

## 2020-08-18 ENCOUNTER — Other Ambulatory Visit: Payer: Self-pay

## 2020-08-18 ENCOUNTER — Encounter: Payer: Self-pay | Admitting: Family Medicine

## 2020-08-18 ENCOUNTER — Ambulatory Visit (INDEPENDENT_AMBULATORY_CARE_PROVIDER_SITE_OTHER): Payer: Medicare Other | Admitting: Family Medicine

## 2020-08-18 VITALS — BP 122/84 | HR 84 | Temp 98.1°F | Resp 16 | Ht 68.0 in | Wt 179.5 lb

## 2020-08-18 DIAGNOSIS — R918 Other nonspecific abnormal finding of lung field: Secondary | ICD-10-CM

## 2020-08-18 DIAGNOSIS — N761 Subacute and chronic vaginitis: Secondary | ICD-10-CM

## 2020-08-18 DIAGNOSIS — E782 Mixed hyperlipidemia: Secondary | ICD-10-CM | POA: Diagnosis not present

## 2020-08-18 DIAGNOSIS — K219 Gastro-esophageal reflux disease without esophagitis: Secondary | ICD-10-CM

## 2020-08-18 DIAGNOSIS — J432 Centrilobular emphysema: Secondary | ICD-10-CM

## 2020-08-18 DIAGNOSIS — I10 Essential (primary) hypertension: Secondary | ICD-10-CM

## 2020-08-18 DIAGNOSIS — I700 Atherosclerosis of aorta: Secondary | ICD-10-CM

## 2020-08-18 DIAGNOSIS — E042 Nontoxic multinodular goiter: Secondary | ICD-10-CM

## 2020-08-18 DIAGNOSIS — Z1231 Encounter for screening mammogram for malignant neoplasm of breast: Secondary | ICD-10-CM | POA: Diagnosis not present

## 2020-08-18 DIAGNOSIS — E1159 Type 2 diabetes mellitus with other circulatory complications: Secondary | ICD-10-CM | POA: Diagnosis not present

## 2020-08-18 DIAGNOSIS — Z5181 Encounter for therapeutic drug level monitoring: Secondary | ICD-10-CM

## 2020-08-18 NOTE — Progress Notes (Signed)
Name: Felicia Cisneros   MRN: [id number]    DOB: 1951-05-15   Date:08/18/2020       Progress Note  Chief Complaint  Patient presents with  . Hypertension  . Hyperlipidemia  . Diabetes     Subjective:   Felicia Cisneros is a 69 y.o. female, presents to clinic for routine f/up  Hypertension:  Currently managed on lisinopril 5 mg  Pt reports good med compliance and denies any SE.   Blood pressure today is well controlled. BP Readings from Last 3 Encounters:  08/18/20 122/84  07/08/20 108/76  07/08/20 108/76   Pt denies CP, SOB, exertional sx, LE edema, palpitation, Ha's, visual disturbances, lightheadedness, hypotension, syncope.   Hyperlipidemia:   Aortic atherosclerosis per LDCT scan for lung CA screening Currently treated with simvastatin, pt reports good med compliance Last Lipids: Lab Results  Component Value Date   CHOL 131 02/15/2020   HDL 57 02/15/2020   LDLCALC 54 02/15/2020   TRIG 121 02/15/2020   CHOLHDL 2.3 02/15/2020   - Denies: Chest pain, shortness of breath, myalgias, claudication  DM:   Pt managing DM with metformin 500 mg once daily Reports good med compliance Pt has no SE from meds. Blood sugars 112 this am Denies: Polyuria, polydipsia, vision changes, neuropathy, hypoglycemia Recent pertinent labs: Lab Results  Component Value Date   HGBA1C 5.7 (H) 02/15/2020   HGBA1C 5.7 (H) 07/03/2019   HGBA1C 5.8 (H) 09/05/2018   Standard of care and health maintenance: Foot exam:  Done - problem with cost of eye exams - pt knows she is due DM eye exam:  due ACEI/ARB:  yes Statin:  yes  Former smoker, lung CA screening - reviewed results today - neg, she states she stopped smoking maybe 10-12 years ago - explained she will screen until 15 years  Thyroid nodules, thyroid US - reviewed, f/up yearly till 2025, mildly enlarged thyroid and unchanged thyroid nodules Next [place] due 06/2021 till 2025  GU sx, recurrent, she has irritation and itching at least  once a week, has been using OTC medications, was prescribed premarin to tx, with multiple calls this year regarding vulvovaginal sx, and dx of atrophic vaginitis, Pt got meds but did not try using due to fear of the SE of premarin. She has a f/up appt with Dr. Amalia Hailey, gyn.  She states she doesn't care about her atrophic vaginitis dx - "it doesn't bother her"   GERD:  On pepcid 20 mg BID, sx well controlled   Current Outpatient Medications:  .  acetaminophen (TYLENOL) 500 MG tablet, Take 1,000 mg by mouth every 6 (six) hours as needed (for pain.)., Disp: , Rfl:  .  aspirin EC 81 MG tablet, Take 81 mg by mouth at bedtime., Disp: , Rfl:  .  BIOTIN PO, Take by mouth daily., Disp: , Rfl:  .  Blood Glucose Monitoring Suppl (ONETOUCH VERIO FLEX SYSTEM) w/Device KIT, Check blood sugars up to twice daily prn Dx:E11.9 LON:99, Disp: 1 kit, Rfl: 0 .  Calcium-Vitamin D-Vitamin K (VIACTIV CALCIUM PLUS D) 650-12.5-40 MG-MCG-MCG CHEW, Chew 1 Troche by mouth in the morning and at bedtime., Disp: , Rfl:  .  CINNAMON PO, Take 1,000 mg by mouth 2 (two) times daily., Disp: , Rfl:  .  Coenzyme Q10 (COQ10) 100 MG CAPS, One by mouth daily, Disp: , Rfl:  .  famotidine (PEPCID) 20 MG tablet, TAKE 1 TABLET BY MOUTH  TWICE DAILY, Disp: 180 tablet, Rfl: 3 .  glucose  blood (ONETOUCH VERIO) test strip, CHECK FINGERSTICK BLOOD SUGARS THREE TIMES A WEEK, Disp: 100 each, Rfl: 3 .  lisinopril (ZESTRIL) 5 MG tablet, Take 1 tablet (5 mg total) by mouth daily. for blood pressure, Disp: 90 tablet, Rfl: 3 .  LYSINE PO, Take by mouth as needed., Disp: , Rfl:  .  Melatonin 2.5 MG CHEW, Chew 2.5 mg by mouth daily as needed., Disp: , Rfl:  .  metFORMIN (GLUCOPHAGE-XR) 500 MG 24 hr tablet, Take 1 tablet (500 mg total) by mouth daily with breakfast., Disp: 90 tablet, Rfl: 3 .  Multiple Vitamins-Minerals (CENTRUM SILVER 50+WOMEN) TABS, Take 1 tablet by mouth daily., Disp: , Rfl:  .  OVER THE COUNTER MEDICATION, neuriva plus OTC supplement  (like prevagen), Disp: , Rfl:  .  simvastatin (ZOCOR) 40 MG tablet, Take 1 tablet (40 mg total) by mouth at bedtime., Disp: 90 tablet, Rfl: 3 .  clotrimazole-betamethasone (LOTRISONE) cream, Apply externally BID PRN (Patient not taking: Reported on 07/08/2020), Disp: 15 g, Rfl: 1 .  conjugated estrogens (PREMARIN) vaginal cream, Place 1 Applicatorful vaginally 2 (two) times a week. (Patient not taking: Reported on 08/18/2020), Disp: 42.5 g, Rfl: 3 .  OneTouch Delica Lancets 69V MISC, 1 application by Does not apply route 3 (three) times a week. Dx:E11.9 LON:99 check BS 3x per week (Patient not taking: Reported on 08/18/2020), Disp: 100 each, Rfl: 5 .  Probiotic Product (TRUBIOTICS) CAPS, Take 1 capsule by mouth daily., Disp: , Rfl:  .  pseudoephedrine (SUDAFED) 30 MG tablet, Take 1 tablet (30 mg total) by mouth every 8 (eight) hours as needed for congestion. No more than 3 tablets/month (Patient not taking: Reported on 08/18/2020), Disp: 30 tablet, Rfl: 0  Patient Active Problem List   Diagnosis Date Noted  . GERD without esophagitis 03/06/2019  . Benign neoplasm of sigmoid colon   . Lower extremity edema 01/31/2018  . Centrilobular emphysema ([place]) 12/02/2016  . Pulmonary nodules/lesions, multiple 12/02/2016  . Aortic atherosclerosis ([place]) 07/08/2016  . Osteopenia 05/27/2016  . Abnormality of nail tissue 11/27/2015  . Herpes simplex antibody positive 08/22/2015  . Essential hypertension   . Type 2 diabetes mellitus, controlled ([place])   . Atrophic vaginitis   . History of tobacco use   . Abnormal EKG 07/13/2013  . Hyperlipidemia 07/13/2013    Past Surgical History:  Procedure Laterality Date  . colonoscopy    . COLONOSCOPY WITH PROPOFOL N/A 02/20/2018   Procedure: COLONOSCOPY WITH PROPOFOL;  Surgeon: Lucilla Lame, MD;  Location: [facility];  Service: Endoscopy;  Laterality: N/A;  Diabetic  . CYSTOSCOPY WITH BIOPSY N/A 01/17/2017   Procedure: CYSTOSCOPY WITH BIOPSY;  Surgeon:  Hollice Espy, MD;  Location: [facility];  Service: Urology;  Laterality: N/A;  . POLYPECTOMY  02/20/2018   Procedure: POLYPECTOMY;  Surgeon: Lucilla Lame, MD;  Location: [facility];  Service: Endoscopy;;  . TMJ ARTHROPLASTY  1984    Family History  Problem Relation Age of Onset  . Stroke Mother   . Hypertension Mother   . Hyperlipidemia Mother   . Diabetes Mother        pre-diabetic  . Hearing loss Mother   . Diabetes Brother   . Irregular heart beat Brother   . COPD Father   . Hearing loss Father   . Thyroid disease Sister   . Stroke Maternal Grandmother   . Hyperlipidemia Maternal Grandmother   . Hypertension Maternal Grandmother   . Cancer Maternal Grandfather  bone  . Cancer Sister        cystic fibroid carcinoma  . Ulcerative colitis Sister   . Heart disease Paternal Uncle   . Heart disease Paternal Uncle   . Hyperlipidemia Maternal Aunt   . Stroke Maternal Aunt   . Breast cancer Neg Hx   . Hematuria Neg Hx   . Prostate cancer Neg Hx   . Renal cancer Neg Hx     Social History   Tobacco Use  . Smoking status: Former Smoker    Packs/day: 3.00    Years: 45.00    Pack years: 135.00    Types: Cigarettes    Quit date: 10/25/2009    Years since quitting: 10.8  . Smokeless tobacco: Never Used  . Tobacco comment: Quit before my husband was diagnosed with small cell cancer.  Vaping Use  . Vaping Use: Never used  Substance Use Topics  . Alcohol use: Not Currently    Alcohol/week: 0.0 standard drinks    Comment: Rarely  . Drug use: No     No Known Allergies  Health Maintenance  Topic Date Due  . OPHTHALMOLOGY EXAM  05/30/2020  . MAMMOGRAM  08/13/2020  . HEMOGLOBIN A1C  08/16/2020  . PNA vac Low Risk Adult (2 of 2 - PPSV23) 11/26/2020  . FOOT EXAM  02/14/2021  . DEXA SCAN  08/13/2021  . COLONOSCOPY  02/21/2028  . TETANUS/TDAP  07/10/2029  . INFLUENZA VACCINE  Completed  . COVID-19 Vaccine  Completed  . Hepatitis C Screening  Completed     Chart Review Today: I personally reviewed active problem list, medication list, allergies, family history, social history, health maintenance, notes from last encounter, lab results, imaging with the patient/caregiver today.   Review of Systems  10 Systems reviewed and are negative for acute change except as noted in the HPI.  Objective:   Vitals:   08/18/20 0913  BP: 122/84  Pulse: 84  Resp: 16  Temp: 98.1 F (36.7 C)  TempSrc: Oral  SpO2: 99%  Weight: 179 lb 8 oz (81.4 kg)  Height: 5' 8"  (1.727 m)    Body mass index is 27.29 kg/m.  Physical Exam Vitals and nursing note reviewed.  Constitutional:      General: She is not in acute distress.    Appearance: Normal appearance. She is well-developed. She is not ill-appearing, toxic-appearing or diaphoretic.     Interventions: Face mask in place.  HENT:     Head: Normocephalic and atraumatic.     Right Ear: External ear normal.     Left Ear: External ear normal.  Eyes:     General: Lids are normal. No scleral icterus.       Right eye: No discharge.        Left eye: No discharge.     Conjunctiva/sclera: Conjunctivae normal.  Neck:     Trachea: Phonation normal. No tracheal deviation.  Cardiovascular:     Rate and Rhythm: Normal rate and regular rhythm.     Pulses: Normal pulses.          Radial pulses are 2+ on the right side and 2+ on the left side.       Posterior tibial pulses are 2+ on the right side and 2+ on the left side.     Heart sounds: Normal heart sounds. No murmur heard.  No friction rub. No gallop.   Pulmonary:     Effort: Pulmonary effort is normal. No respiratory distress.  Breath sounds: Normal breath sounds. No stridor. No wheezing, rhonchi or rales.  Chest:     Chest wall: No tenderness.  Abdominal:     General: Bowel sounds are normal. There is no distension.     Palpations: Abdomen is soft.  Musculoskeletal:     Right lower leg: No edema.     Left lower leg: No edema.  Skin:     General: Skin is warm and dry.     Coloration: Skin is not jaundiced or pale.     Findings: No rash.  Neurological:     Mental Status: She is alert.     Motor: No abnormal muscle tone.     Gait: Gait normal.  Psychiatric:        Mood and Affect: Mood normal.        Speech: Speech normal.        Behavior: Behavior normal.         Assessment & Plan:   1. Mixed hyperlipidemia Last lipids reviewed, LDL at goal, good med compliance with statin, no SE or concerns - will continue to monitor LFTs - COMPLETE METABOLIC PANEL WITH GFR  2. Essential hypertension Stable, well controlled, BP at goal today - COMPLETE METABOLIC PANEL WITH GFR  3. Controlled type 2 diabetes mellitus with other circulatory complication, without long-term current use of insulin (HCC) Stable, well controlled, last A1C at goal, morning blood sugars have been optimal Continue once daily metformin and diet/lifestyle effots - COMPLETE METABOLIC PANEL WITH GFR - Hemoglobin A1c  4. Encounter for screening mammogram for malignant neoplasm of breast - MM 3D SCREEN BREAST BILATERAL; Future  5. Encounter for medication monitoring - COMPLETE METABOLIC PANEL WITH GFR - Hemoglobin A1c  6. GERD without esophagitis Sx well controlled with pepcid  7. Multiple thyroid nodules Reviewed recent thyroid US - repeat annually or biannually till 2025  8. Subacute vaginitis Pt with recurrent sx, did assess her and attempt to tx previously but she refuses premarin - she has f/up with GYN, sx occurring weekly - attempted to explain again with vulvovaginal sx frequently and atrophic vaginitis, the conjugated estrogen suppositories maybe only 2x a week may help to manage and get rid of sx  9. Aortic atherosclerosis (HCC) On statin, discussed finding in her screening CT and discussed management and monitoring - COMPLETE METABOLIC PANEL WITH GFR  10. Centrilobular emphysema (HCC) No sx - con't screening  11. Pulmonary  nodules/lesions, multiple Reviewed recent results with pt, no change, continue with LUNG CA screening team    Return in about 6 months (around 02/16/2021) for Routine follow-up.   [name], PA-C 08/18/20 9:30 AM

## 2020-08-18 NOTE — Patient Instructions (Signed)
[facility] [place],  [place]  [address] Get Driving Directions Main: [phone]  Call to schedule your mammogram  Health Maintenance  Topic Date Due  . Eye exam for diabetics  05/30/2020  . Mammogram  08/13/2020  . Hemoglobin A1C  08/16/2020  . Pneumonia vaccines (2 of 2 - PPSV23) 11/26/2020  . Complete foot exam   02/14/2021  . DEXA scan (bone density measurement)  08/13/2021  . Colon Cancer Screening  02/21/2028  . Tetanus Vaccine  07/10/2029  . Flu Shot  Completed  . COVID-19 Vaccine  Completed  .  Hepatitis C: One time screening is recommended by Center for Disease Control  (CDC) for  adults born from [birth year] through 1965.   Completed

## 2020-08-19 ENCOUNTER — Encounter: Payer: Self-pay | Admitting: Family Medicine

## 2020-08-19 ENCOUNTER — Ambulatory Visit (INDEPENDENT_AMBULATORY_CARE_PROVIDER_SITE_OTHER): Payer: Medicare Other | Admitting: Obstetrics and Gynecology

## 2020-08-19 ENCOUNTER — Encounter: Payer: Self-pay | Admitting: Obstetrics and Gynecology

## 2020-08-19 VITALS — BP 115/70 | HR 87 | Ht 68.0 in | Wt 180.8 lb

## 2020-08-19 DIAGNOSIS — N362 Urethral caruncle: Secondary | ICD-10-CM

## 2020-08-19 DIAGNOSIS — Q525 Fusion of labia: Secondary | ICD-10-CM

## 2020-08-19 DIAGNOSIS — L900 Lichen sclerosus et atrophicus: Secondary | ICD-10-CM | POA: Diagnosis not present

## 2020-08-19 DIAGNOSIS — N952 Postmenopausal atrophic vaginitis: Secondary | ICD-10-CM

## 2020-08-19 LAB — COMPLETE METABOLIC PANEL WITHOUT GFR
AG Ratio: 1.8 (calc) (ref 1.0–2.5)
ALT: 18 U/L (ref 6–29)
AST: 20 U/L (ref 10–35)
Albumin: 4.4 g/dL (ref 3.6–5.1)
Alkaline phosphatase (APISO): 74 U/L (ref 37–153)
BUN: 20 mg/dL (ref 7–25)
CO2: 28 mmol/L (ref 20–32)
Calcium: 10.5 mg/dL — ABNORMAL HIGH (ref 8.6–10.4)
Chloride: 105 mmol/L (ref 98–110)
Creat: 0.78 mg/dL (ref 0.50–0.99)
GFR, Est African American: 90 mL/min/1.73m2 (ref >=60)
GFR, Est Non African American: 78 mL/min/1.73m2 (ref >=60)
Globulin: 2.4 g/dL (ref 1.9–3.7)
Glucose, Bld: 94 mg/dL (ref 65–99)
Potassium: 6 mmol/L — ABNORMAL HIGH (ref 3.5–5.3)
Sodium: 142 mmol/L (ref 135–146)
Total Bilirubin: 0.4 mg/dL (ref 0.2–1.2)
Total Protein: 6.8 g/dL (ref 6.1–8.1)

## 2020-08-19 LAB — HEMOGLOBIN A1C
Hgb A1c MFr Bld: 5.9 %{Hb} — ABNORMAL HIGH (ref <5.7)
Mean Plasma Glucose: 123 (calc)
eAG (mmol/L): 6.8 (calc)

## 2020-08-19 MED ORDER — CLOBETASOL PROPIONATE 0.05 % EX OINT: TOPICAL_OINTMENT | Freq: Two times a day (BID) | CUTANEOUS | Status: AC

## 2020-08-19 NOTE — Progress Notes (Signed)
HPI:      [name] is a 69 y.o. No obstetric history on file. who LMP was No LMP recorded. Patient is postmenopausal.  Subjective:   She presents today with complaint of intermittent vulvar irritation especially between the vagina and rectum.  She says it occasionally becomes itchy.  She has seen her nurse practitioner for this problem and her nurse practitioner recommended external vulvar estrogen. Patient states that she knows she has vaginal atrophy but has no desire to fix it as she has no desire to use her vagina.  She does not want to put anything inside.    Hx: The following portions of the patient's history were reviewed and updated as appropriate:             She  has a past medical history of Aortic atherosclerosis ([place]), Atrophic vaginitis, Chest pain, COPD (chronic obstructive pulmonary disease) ([place]), Diabetes mellitus without complication ([place]), Diverticulitis, DNAR (do not attempt resuscitation) (06/09/2017), Essential hypertension, GERD (gastroesophageal reflux disease), History of kidney stones, History of tobacco use, Hyperlipidemia, IFG (impaired fasting glucose), Irregular heart beat (2018), Leukorrhea, not specified as infective, Osteopenia (05/27/2016), Personal history of tobacco use, presenting hazards to health (08/20/2015), and Pulmonary nodules. She does not have any pertinent problems on file. She  has a past surgical history that includes colonoscopy; TMJ Arthroplasty (1984); Cystoscopy with biopsy (N/A, 01/17/2017); Colonoscopy with propofol (N/A, 02/20/2018); and polypectomy (02/20/2018). Her family history includes COPD in her father; Cancer in her maternal grandfather and sister; Diabetes in her brother and mother; Hearing loss in her father and mother; Heart disease in her paternal uncle and paternal uncle; Hyperlipidemia in her maternal aunt, maternal grandmother, and mother; Hypertension in her maternal grandmother and mother; Irregular heart beat in her  brother; Stroke in her maternal aunt, maternal grandmother, and mother; Thyroid disease in her sister; Ulcerative colitis in her sister. She  reports that she quit smoking about 10 years ago. Her smoking use included cigarettes. She has a 135.00 pack-year smoking history. She has never used smokeless tobacco. She reports previous alcohol use. She reports that she does not use drugs. She has a current medication list which includes the following prescription(s): acetaminophen, aspirin ec, biotin, onetouch verio flex system, viactiv calcium plus d, cinnamon, clotrimazole-betamethasone, coq10, famotidine, onetouch verio, lisinopril, lysine, melatonin, metformin, centrum silver 81+EHUDJ, onetouch delica lancets 49F, OVER THE COUNTER MEDICATION, pseudoephedrine, simvastatin, premarin, and trubiotics, and the following Facility-Administered Medications: clobetasol ointment. She has No Known Allergies.       Review of Systems:  Review of Systems  Constitutional: Denied constitutional symptoms, night sweats, recent illness, fatigue, fever, insomnia and weight loss.  Eyes: Denied eye symptoms, eye pain, photophobia, vision change and visual disturbance.  Ears/Nose/Throat/Neck: Denied ear, nose, throat or neck symptoms, hearing loss, nasal discharge, sinus congestion and sore throat.  Cardiovascular: Denied cardiovascular symptoms, arrhythmia, chest pain/pressure, edema, exercise intolerance, orthopnea and palpitations.  Respiratory: Denied pulmonary symptoms, asthma, pleuritic pain, productive sputum, cough, dyspnea and wheezing.  Gastrointestinal: Denied, gastro-esophageal reflux, melena, nausea and vomiting.  Genitourinary: See HPI for additional information.  Musculoskeletal: Denied musculoskeletal symptoms, stiffness, swelling, muscle weakness and myalgia.  Dermatologic: Denied dermatology symptoms, rash and scar.  Neurologic: Denied neurology symptoms, dizziness, headache, neck pain and syncope.   Psychiatric: Denied psychiatric symptoms, anxiety and depression.  Endocrine: Denied endocrine symptoms including hot flashes and night sweats.   Meds:   Current Outpatient Medications on File Prior to Visit  Medication Sig Dispense Refill  .  acetaminophen (TYLENOL) 500 MG tablet Take 1,000 mg by mouth every 6 (six) hours as needed (for pain.).    Marland Kitchen aspirin EC 81 MG tablet Take 81 mg by mouth at bedtime.    Marland Kitchen BIOTIN PO Take by mouth daily.    . Blood Glucose Monitoring Suppl (ONETOUCH VERIO FLEX SYSTEM) w/Device KIT Check blood sugars up to twice daily prn Dx:E11.9 LON:99 1 kit 0  . Calcium-Vitamin D-Vitamin K (VIACTIV CALCIUM PLUS D) 650-12.5-40 MG-MCG-MCG CHEW Chew 1 Troche by mouth in the morning and at bedtime.    Marland Kitchen CINNAMON PO Take 1,000 mg by mouth 2 (two) times daily.    . clotrimazole-betamethasone (LOTRISONE) cream Apply externally BID PRN 15 g 1  . Coenzyme Q10 (COQ10) 100 MG CAPS One by mouth daily    . famotidine (PEPCID) 20 MG tablet TAKE 1 TABLET BY MOUTH  TWICE DAILY 180 tablet 3  . glucose blood (ONETOUCH VERIO) test strip CHECK FINGERSTICK BLOOD SUGARS THREE TIMES A WEEK 100 each 3  . lisinopril (ZESTRIL) 5 MG tablet Take 1 tablet (5 mg total) by mouth daily. for blood pressure 90 tablet 3  . LYSINE PO Take by mouth as needed.    . Melatonin 2.5 MG CHEW Chew 2.5 mg by mouth daily as needed.    . metFORMIN (GLUCOPHAGE-XR) 500 MG 24 hr tablet Take 1 tablet (500 mg total) by mouth daily with breakfast. 90 tablet 3  . Multiple Vitamins-Minerals (CENTRUM SILVER 50+WOMEN) TABS Take 1 tablet by mouth daily.    Glory Rosebush Delica Lancets 62H MISC 1 application by Does not apply route 3 (three) times a week. Dx:E11.9 LON:99 check BS 3x per week 100 each 5  . OVER THE COUNTER MEDICATION neuriva plus OTC supplement (like prevagen)    . pseudoephedrine (SUDAFED) 30 MG tablet Take 1 tablet (30 mg total) by mouth every 8 (eight) hours as needed for congestion. No more than 3 tablets/month  30 tablet 0  . simvastatin (ZOCOR) 40 MG tablet Take 1 tablet (40 mg total) by mouth at bedtime. 90 tablet 3  . conjugated estrogens (PREMARIN) vaginal cream Place 1 Applicatorful vaginally 2 (two) times a week. (Patient not taking: Reported on 08/19/2020) 42.5 g 3  . Probiotic Product (TRUBIOTICS) CAPS Take 1 capsule by mouth daily.     No current facility-administered medications on file prior to visit.          Objective:     Vitals:   08/19/20 1519  BP: 115/70  Pulse: 87   Filed Weights   08/19/20 1519  Weight: 180 lb 12.8 oz (82 kg)              Physical examination   Pelvic:  Vulva:  Some posterior labial fusion  Vagina: No lesions or abnormalities noted.  Vaginal atrophy noted although no speculum exam performed  Support: Normal pelvic support.  Urethra  urethral caruncle present  Meatus Normal size without lesions or prolapse.  Cervix:   Anus: Normal exam.  No lesions.  Perineum:  Normal appearance     Assessment:    No obstetric history on file. Patient Active Problem List   Diagnosis Date Noted  . GERD without esophagitis 03/06/2019  . Benign neoplasm of sigmoid colon   . Lower extremity edema 01/31/2018  . Centrilobular emphysema ([place]) 12/02/2016  . Pulmonary nodules/lesions, multiple 12/02/2016  . Aortic atherosclerosis ([place]) 07/08/2016  . Osteopenia 05/27/2016  . Abnormality of nail tissue 11/27/2015  . Herpes simplex antibody positive  08/22/2015  . Essential hypertension   . Type 2 diabetes mellitus, controlled ([place])   . Atrophic vaginitis   . History of tobacco use   . Abnormal EKG 07/13/2013  . Hyperlipidemia 07/13/2013     1. Lichen sclerosus et atrophicus   2. Urethral caruncle   3. Labial fusion   4. Vaginal atrophy     Patient likely has early lichen sclerosus causing labial fusion.  She is not interested in fixing urethral caruncle and has no desire to fix vaginal atrophy.  It is the occasional symptoms of itching and burning  that she is interested in resolving.   Plan:            1.  Recommend use of clobetasol to labia especially in the fused area.  This should help with her itching and burning symptoms.  2.  Patient has declined estrogen use for urethral caruncle or vaginal atrophy. Orders No orders of the defined types were placed in this encounter.    Meds ordered this encounter  Medications  . clobetasol ointment (TEMOVATE) 0.05 %      F/U  Return in about 6 weeks (around 09/30/2020). I spent 32 minutes involved in the care of this patient preparing to see the patient by obtaining and reviewing her medical history (including labs, imaging tests and prior procedures), documenting clinical information in the electronic health record (EHR), counseling and coordinating care plans, writing and sending prescriptions, ordering tests or procedures and directly communicating with the patient by discussing pertinent items from her history and physical exam as well as detailing my assessment and plan as noted above so that she has an informed understanding.  All of her questions were answered.  Finis Bud, M.D. 08/19/2020 4:14 PM

## 2020-08-20 ENCOUNTER — Telehealth: Payer: Self-pay

## 2020-08-20 MED ORDER — CLOBETASOL PROPIONATE 0.05 % EX CREA
TOPICAL_CREAM | CUTANEOUS | 2 refills | Status: DC
Start: 2020-08-20 — End: 2022-11-01

## 2020-08-20 NOTE — Telephone Encounter (Signed)
[facility] in [place] called and stated that the patient has been trying to call about medication. I explained that I just received the message at 12:12 and I was at lunch when the message came through. I have sent the prescription to the pharmacy. I have notified patient.

## 2020-08-20 NOTE — Telephone Encounter (Signed)
Pt called in and stated that she was seen yesterday and the pt is waiting for her ointment [facility] [employer]. I told the pt I see the order but I will send a message. The pt verbally understood. Please advise

## 2020-08-21 ENCOUNTER — Other Ambulatory Visit: Payer: Self-pay | Admitting: Emergency Medicine

## 2020-08-21 ENCOUNTER — Encounter: Payer: Self-pay | Admitting: Family Medicine

## 2020-08-21 DIAGNOSIS — E875 Hyperkalemia: Secondary | ICD-10-CM

## 2020-08-25 ENCOUNTER — Other Ambulatory Visit: Payer: Self-pay | Admitting: Emergency Medicine

## 2020-08-25 ENCOUNTER — Encounter: Payer: Self-pay | Admitting: Family Medicine

## 2020-08-25 DIAGNOSIS — E1159 Type 2 diabetes mellitus with other circulatory complications: Secondary | ICD-10-CM

## 2020-08-26 LAB — BASIC METABOLIC PANEL WITH GFR
BUN: 18 mg/dL (ref 7–25)
CO2: 24 mmol/L (ref 20–32)
Calcium: 9.6 mg/dL (ref 8.6–10.4)
Chloride: 108 mmol/L (ref 98–110)
Creat: 0.81 mg/dL (ref 0.50–0.99)
GFR, Est African American: 86 mL/min/{1.73_m2} (ref >=60)
GFR, Est Non African American: 74 mL/min/{1.73_m2} (ref >=60)
Glucose, Bld: 93 mg/dL (ref 65–99)
Potassium: 5 mmol/L (ref 3.5–5.3)
Sodium: 141 mmol/L (ref 135–146)

## 2020-08-26 LAB — MICROALBUMIN / CREATININE URINE RATIO
Creatinine, Urine: 77 mg/dL (ref 20–275)
Microalb Creat Ratio: 6 mcg/mg creat (ref <30)
Microalb, Ur: 0.5 mg/dL

## 2020-09-14 NOTE — Progress Notes (Signed)
Cardiology Office Note  Date:  09/16/2020   ID:  Felicia Cisneros, DOB December 10, 1950, MRN [id number]  PCP:  [name], PA-C   Chief Complaint  Patient presents with  . Other    Patient wants to discuss Lung CT results - Atherosclerotic calcification is noted in the wall of the. Meds reviewed verbally with patient.     HPI:  Felicia Cisneros is a very pleasant 69 year old woman with long history of  smoking, quit >5 yrs ago Hyperlipidemia , on statin Remote dizziness and numbness on prior evaluation in 2014,  osteoporosis  CT chest 2016:  No significant coronary ca Mild aorta plaque who presents for follow up of her type 2 diabetes, aorta atherosclerosis  Husband died 4 yrs ago from lung CA She has recovered Walked 6 miles yesterday for exercise, slept well Doing well in general Walks with neighbor  Denies any significant shortness of breath or chest pain on exertion Takes melatonin for sleep  CT scan reviewed from 06/2020 Mild aortic atherosclerosis, minimal coronary calcification  Labs reviewed Total chol 131, LDL 54 HBA1C 5.9  EKG personally reviewed by myself on todays visit Shows normal sinus rhythm with rate 73 bpm, no significant ST or T wave changes   Other past medical history reviewed CT chest 2016:  No significant coronary ca Mild diffuse aortic atherosclerosis Seen in the aortic arch, descending aorta   She has osteopenia noted on the DEXA, Hypertension, Type 2 diabetes; hyperlipidemia,   PMH:   has a past medical history of Aortic atherosclerosis ([place]), Atrophic vaginitis, Chest pain, COPD (chronic obstructive pulmonary disease) ([place]), Diabetes mellitus without complication ([place]), Diverticulitis, DNAR (do not attempt resuscitation) (06/09/2017), Essential hypertension, GERD (gastroesophageal reflux disease), History of kidney stones, History of tobacco use, Hyperlipidemia, IFG (impaired fasting glucose), Irregular heart beat (2018), Leukorrhea, not specified as  infective, Osteopenia (05/27/2016), Personal history of tobacco use, presenting hazards to health (08/20/2015), and Pulmonary nodules.  PSH:    Past Surgical History:  Procedure Laterality Date  . colonoscopy    . COLONOSCOPY WITH PROPOFOL N/A 02/20/2018   Procedure: COLONOSCOPY WITH PROPOFOL;  Surgeon: Lucilla Lame, MD;  Location: [place];  Service: Endoscopy;  Laterality: N/A;  Diabetic  . CYSTOSCOPY WITH BIOPSY N/A 01/17/2017   Procedure: CYSTOSCOPY WITH BIOPSY;  Surgeon: Hollice Espy, MD;  Location: [facility];  Service: Urology;  Laterality: N/A;  . POLYPECTOMY  02/20/2018   Procedure: POLYPECTOMY;  Surgeon: Lucilla Lame, MD;  Location: [place];  Service: Endoscopy;;  . TMJ ARTHROPLASTY  1984    Current Outpatient Medications  Medication Sig Dispense Refill  . acetaminophen (TYLENOL) 500 MG tablet Take 1,000 mg by mouth every 6 (six) hours as needed (for pain.).    Marland Kitchen aspirin EC 81 MG tablet Take 81 mg by mouth at bedtime.    Marland Kitchen BIOTIN PO Take by mouth daily.    . Blood Glucose Monitoring Suppl (ONETOUCH VERIO FLEX SYSTEM) w/Device KIT Check blood sugars up to twice daily prn Dx:E11.9 LON:99 1 kit 0  . Calcium-Vitamin D-Vitamin K (VIACTIV CALCIUM PLUS D) 650-12.5-40 MG-MCG-MCG CHEW Chew 1 Troche by mouth in the morning and at bedtime.    Marland Kitchen CINNAMON PO Take 1,000 mg by mouth 2 (two) times daily.    . clobetasol cream (TEMOVATE) 0.05 % Apply to labia twice daily for 3 weeks then twice weekly as directed 60 g 2  . Coenzyme Q10 (COQ10) 100 MG CAPS One by mouth daily    . famotidine (  PEPCID) 20 MG tablet TAKE 1 TABLET BY MOUTH  TWICE DAILY 180 tablet 3  . glucose blood (ONETOUCH VERIO) test strip CHECK FINGERSTICK BLOOD SUGARS THREE TIMES A WEEK 100 each 3  . lisinopril (ZESTRIL) 5 MG tablet Take 1 tablet (5 mg total) by mouth daily. for blood pressure 90 tablet 3  . LYSINE PO Take by mouth as needed.    . Melatonin 2.5 MG CHEW Chew 2.5 mg by mouth daily as needed.    .  metFORMIN (GLUCOPHAGE-XR) 500 MG 24 hr tablet Take 1 tablet (500 mg total) by mouth daily with breakfast. 90 tablet 3  . Multiple Vitamins-Minerals (CENTRUM SILVER 50+WOMEN) TABS Take 1 tablet by mouth daily.    Glory Rosebush Delica Lancets 23N MISC 1 application by Does not apply route 3 (three) times a week. Dx:E11.9 LON:99 check BS 3x per week 100 each 5  . OVER THE COUNTER MEDICATION neuriva plus OTC supplement (like prevagen)    . Probiotic Product (TRUBIOTICS) CAPS Take 1 capsule by mouth daily.    . pseudoephedrine (SUDAFED) 30 MG tablet Take 1 tablet (30 mg total) by mouth every 8 (eight) hours as needed for congestion. No more than 3 tablets/month 30 tablet 0  . simvastatin (ZOCOR) 40 MG tablet Take 1 tablet (40 mg total) by mouth at bedtime. 90 tablet 3   Current Facility-Administered Medications  Medication Dose Route Frequency Provider Last Rate Last Admin  . clobetasol ointment (TEMOVATE) 0.05 %   Topical BID Harlin Heys, MD         Allergies:   Patient has no known allergies.   Social History:  The patient  reports that she quit smoking about 10 years ago. Her smoking use included cigarettes. She has a 135.00 pack-year smoking history. She has never used smokeless tobacco. She reports previous alcohol use. She reports that she does not use drugs.   Family History:   family history includes COPD in her father; Cancer in her maternal grandfather and sister; Diabetes in her brother and mother; Hearing loss in her father and mother; Heart disease in her paternal uncle and paternal uncle; Hyperlipidemia in her maternal aunt, maternal grandmother, and mother; Hypertension in her maternal grandmother and mother; Irregular heart beat in her brother; Stroke in her maternal aunt, maternal grandmother, and mother; Thyroid disease in her sister; Ulcerative colitis in her sister.    Review of Systems: Review of Systems  Constitutional: Negative.   Respiratory: Negative.    Cardiovascular: Negative.   Gastrointestinal: Negative.   Musculoskeletal: Negative.   Neurological: Negative.   Psychiatric/Behavioral: Negative.   All other systems reviewed and are negative.   PHYSICAL EXAM: VS:  BP 130/90 (BP Location: Left Arm, Patient Position: Sitting, Cuff Size: Normal)   Pulse 73   Ht 5' 8"  (1.727 m)   Wt 177 lb (80.3 kg)   SpO2 96%   BMI 26.91 kg/m  , BMI Body mass index is 26.91 kg/m.  Constitutional:  oriented to person, place, and time. No distress.  HENT:  Head: Grossly normal Eyes:  no discharge. No scleral icterus.  Neck: No JVD, no carotid bruits  Cardiovascular: Regular rate and rhythm, no murmurs appreciated Pulmonary/Chest: Clear to auscultation bilaterally, no wheezes or rails Abdominal: Soft.  no distension.  no tenderness.  Musculoskeletal: Normal range of motion Neurological:  normal muscle tone. Coordination normal. No atrophy Skin: Skin warm and dry Psychiatric: normal affect, pleasant  Recent Labs: 10/03/2019: TSH 2.99 05/14/2020: Hemoglobin 12.1; Platelets 251  08/18/2020: ALT 18 08/25/2020: BUN 18; Creat 0.81; Potassium 5.0; Sodium 141    Lipid Panel Lab Results  Component Value Date   CHOL 131 02/15/2020   HDL 57 02/15/2020   LDLCALC 54 02/15/2020   TRIG 121 02/15/2020      Wt Readings from Last 3 Encounters:  09/16/20 177 lb (80.3 kg)  08/19/20 180 lb 12.8 oz (82 kg)  08/18/20 179 lb 8 oz (81.4 kg)       ASSESSMENT AND PLAN:  Hyperlipidemia Cholesterol is at goal on the current lipid regimen. No changes to the medications were made.  History of tobacco use CT scan chest pulled up from 2021 mild aortic atherosclerosis, minimal coronary calcification if any  Controlled type 2 diabetes mellitus with other circulatory complication, without long-term current use of insulin (HCC)  A1c well controlled Continue exercise  Aortic atherosclerosis (HCC) Images from CT pulled up and discussed, mild,  diffuse Cholesterol at goal   Total encounter time more than 25 minutes  Greater than 50% was spent in counseling and coordination of care with the patient   No orders of the defined types were placed in this encounter.    Signed, Esmond Plants, M.D., Ph.D. 09/16/2020  [place], [place]

## 2020-09-16 ENCOUNTER — Encounter: Payer: Self-pay | Admitting: Cardiovascular Disease

## 2020-09-16 ENCOUNTER — Other Ambulatory Visit: Payer: Self-pay

## 2020-09-16 ENCOUNTER — Ambulatory Visit: Payer: Self-pay

## 2020-09-16 ENCOUNTER — Ambulatory Visit: Payer: Medicare Other | Admitting: Cardiovascular Disease

## 2020-09-16 VITALS — BP 130/90 | HR 73 | Ht 68.0 in | Wt 177.0 lb

## 2020-09-16 DIAGNOSIS — I10 Essential (primary) hypertension: Secondary | ICD-10-CM

## 2020-09-16 DIAGNOSIS — E119 Type 2 diabetes mellitus without complications: Secondary | ICD-10-CM

## 2020-09-16 DIAGNOSIS — E782 Mixed hyperlipidemia: Secondary | ICD-10-CM | POA: Diagnosis not present

## 2020-09-16 DIAGNOSIS — R0789 Other chest pain: Secondary | ICD-10-CM

## 2020-09-16 DIAGNOSIS — E1159 Type 2 diabetes mellitus with other circulatory complications: Secondary | ICD-10-CM

## 2020-09-16 NOTE — Patient Instructions (Signed)
Medication Instructions:  No changes  If you need a refill on your cardiac medications before your next appointment, please call your pharmacy.    Lab work: No new labs needed   If you have labs (blood work) drawn today and your tests are completely normal, you will receive your results only by: . MyChart Message (if you have MyChart) OR . A paper copy in the mail If you have any lab test that is abnormal or we need to change your treatment, we will call you to review the results.   Testing/Procedures: No new testing needed   Follow-Up: At [facility], you and your health needs are our priority.  As part of our continuing mission to provide you with exceptional heart care, we have created designated Provider Care Teams.  These Care Teams include your primary Cardiologist (physician) and Advanced Practice Providers (APPs -  Physician Assistants and Nurse Practitioners) who all work together to provide you with the care you need, when you need it.  . You will need a follow up appointment in 12 months  . Providers on your designated Care Team:   . Christopher Berge, NP . Ryan Dunn, PA-C . Jacquelyn Visser, PA-C  Any Other Special Instructions Will Be Listed Below (If Applicable).  COVID-19 Vaccine Information can be found at: [email or web address] For questions related to vaccine distribution or appointments, please email [email or web address] or call [phone].     

## 2020-09-16 NOTE — Chronic Care Management (AMB) (Signed)
Chronic Care Management Pharmacy  Name: Felicia Cisneros  MRN: [id number] DOB: 1951/03/14  Chief Complaint/ HPI  Felicia Cisneros,  69 y.o. , female presents for their Follow-Up CCM visit with the clinical pharmacist via telephone.  PCP : [name], PA-C  Their chronic conditions include: Hypertension, Hyperlipidemia, Diabetes, GERD, and Osteopenia  Office Visits: 08/18/20: Patient presented to [name], PA-C for follow-up. No medication changes made.   Consult Visit: 09/16/20: Patient presented to Dr. Rockey Situ for follow-up. Cholesterol at goal. Clotrimazole and premarin stopped.   Medications: Outpatient Encounter Medications as of 09/16/2020  Medication Sig   acetaminophen (TYLENOL) 500 MG tablet Take 1,000 mg by mouth every 6 (six) hours as needed (for pain.).   aspirin EC 81 MG tablet Take 81 mg by mouth at bedtime.   BIOTIN PO Take by mouth daily.   Blood Glucose Monitoring Suppl (ONETOUCH VERIO FLEX SYSTEM) w/Device KIT Check blood sugars up to twice daily prn Dx:E11.9 LON:99   Calcium-Vitamin D-Vitamin K (VIACTIV CALCIUM PLUS D) 650-12.5-40 MG-MCG-MCG CHEW Chew 1 Troche by mouth in the morning and at bedtime.   CINNAMON PO Take 1,000 mg by mouth 2 (two) times daily.   clobetasol cream (TEMOVATE) 0.05 % Apply to labia twice daily for 3 weeks then twice weekly as directed   Coenzyme Q10 (COQ10) 100 MG CAPS One by mouth daily   famotidine (PEPCID) 20 MG tablet TAKE 1 TABLET BY MOUTH  TWICE DAILY   glucose blood (ONETOUCH VERIO) test strip CHECK FINGERSTICK BLOOD SUGARS THREE TIMES A WEEK   lisinopril (ZESTRIL) 5 MG tablet Take 1 tablet (5 mg total) by mouth daily. for blood pressure   LYSINE PO Take by mouth as needed.   Melatonin 2.5 MG CHEW Chew 2.5 mg by mouth daily as needed.   metFORMIN (GLUCOPHAGE-XR) 500 MG 24 hr tablet Take 1 tablet (500 mg total) by mouth daily with breakfast.   Multiple Vitamins-Minerals (CENTRUM SILVER 50+WOMEN) TABS Take 1  tablet by mouth daily.   OneTouch Delica Lancets 67Y MISC 1 application by Does not apply route 3 (three) times a week. Dx:E11.9 LON:99 check BS 3x per week   OVER THE COUNTER MEDICATION neuriva plus OTC supplement (like prevagen)   Probiotic Product (TRUBIOTICS) CAPS Take 1 capsule by mouth daily.   pseudoephedrine (SUDAFED) 30 MG tablet Take 1 tablet (30 mg total) by mouth every 8 (eight) hours as needed for congestion. No more than 3 tablets/month   simvastatin (ZOCOR) 40 MG tablet Take 1 tablet (40 mg total) by mouth at bedtime.   Facility-Administered Encounter Medications as of 09/16/2020  Medication   clobetasol ointment (TEMOVATE) 0.05 %    Current Diagnosis/Assessment:  SDOH Interventions     Most Recent Value  SDOH Interventions  Financial Strain Interventions Intervention Not Indicated  Transportation Interventions Intervention Not Indicated      Goals Addressed            This Visit's Progress    Chronic Care Management       CARE PLAN ENTRY (see longitudinal plan of care for additional care plan information)  Current Barriers:   Chronic Disease Management support, education, and care coordination needs related to Hypertension, Hyperlipidemia, Diabetes, GERD, and Osteopenia   Hypertension BP Readings from Last 3 Encounters:  09/16/20 130/90  08/19/20 115/70  08/18/20 122/84    Pharmacist Clinical Goal(s): o Over the next 90 days, patient will work with PharmD and providers to maintain BP goal <140/90  Current regimen:  o Lisinopril 5 mg daily  Interventions: o Discussed low salt diet and exercising as tolerated extensively o Will initiate blood pressure monitoring plan   Patient self care activities - Over the next 90 days, patient will: o Check Blood Pressure 1-2 times monthly, document, and provide at future appointments o Ensure daily salt intake < 2300 mg/day  Hyperlipidemia Lab Results  Component Value Date/Time   LDLCALC 54  02/15/2020 09:32 AM    Pharmacist Clinical Goal(s): o Over the next 90 days, patient will work with PharmD and providers to maintain LDL goal < 100  Current regimen:  o Simvastatin 40 mg nightly  Interventions: o Discussed low cholesterol diet and exercising as tolerated extensively o Will initiate cholesterol monitoring plan   Patient self care activities - Over the next 90 days, patient will: o Continue taking simvastatin at night  Diabetes Lab Results  Component Value Date/Time   HGBA1C 5.9 (H) 08/18/2020 09:49 AM   HGBA1C 5.7 (H) 02/15/2020 09:32 AM    Pharmacist Clinical Goal(s): o Over the next 90 days, patient will work with PharmD and providers to maintain A1c goal <7%  Current regimen:  o Metformin 500 mg daily  Interventions: o Discussed carbohydrate counting and exercising as tolerated extensively o Will initiate blood sugar monitoring plan   Patient self care activities - Over the next 90 days, patient will: o Check blood sugar when symptomatic, document, and provide at future appointments o Contact provider with any episodes of hypoglycemia  Medication management  Pharmacist Clinical Goal(s): o Over the next 90 days, patient will work with PharmD and providers to maintain optimal medication adherence  Current pharmacy: [facility]  Interventions o Comprehensive medication review performed. o Continue current medication management strategy  Patient self care activities - Over the next 90 days, patient will: o Take medications as prescribed o Report any questions or concerns to PharmD and/or provider(s)      Hypertension   BP goal is:  <130/80  Office blood pressures are  BP Readings from Last 3 Encounters:  09/16/20 130/90  08/19/20 115/70  08/18/20 122/84   Patient checks BP at home Never Patient home BP readings are ranging: n/a  Patient has failed these meds in the past: n/a Patient is currently controlled on the following medications:     Lisinopril 5 mg daily   We discussed diet and exercise extensively  Plan  Continue current medications  Increase monitoring to once monthly  Hyperlipidemia   Strong family history of stroke.  LDL goal < 100  Last lipids Lab Results  Component Value Date   CHOL 131 02/15/2020   HDL 57 02/15/2020   LDLCALC 54 02/15/2020   TRIG 121 02/15/2020   CHOLHDL 2.3 02/15/2020   Hepatic Function Latest Ref Rng & Units 08/18/2020 02/15/2020 06/05/2019  Total Protein 6.1 - 8.1 g/dL 6.8 6.4 7.2  Albumin 3.5 - 5.0 g/dL - - 4.7  AST 10 - 35 U/L 20 17 20   ALT 6 - 29 U/L 18 19 19   Alk Phosphatase 38 - 126 U/L - - 70  Total Bilirubin 0.2 - 1.2 mg/dL 0.4 0.5 0.4     The 10-year ASCVD risk score Mikey Bussing DC Jr., et al., 2013) is: 18.6%   Values used to calculate the score:     Age: 12 years     Sex: Female     Is Non-Hispanic African American: No     Diabetic: Yes     Tobacco smoker: No  Systolic Blood Pressure: 470 mmHg     Is BP treated: Yes     HDL Cholesterol: 57 mg/dL     Total Cholesterol: 131 mg/dL   Patient has failed these meds in past: n/a Patient is currently controlled on the following medications:   Aspirin 81 mg daily   Simvastatin 40 mg QHS   We discussed:  diet and exercise extensively. Given T2DM and Intermediate risk, patient is a candidate for a moderate-intensity statin. Per Cardiology, patient is to continue on aspirin therapy for primary prevention due to strong family history and low bleeding risk.   Plan  Continue current medications  Diabetes   Recent Relevant Labs: Lab Results  Component Value Date/Time   HGBA1C 5.9 (H) 08/18/2020 09:49 AM   HGBA1C 5.7 (H) 02/15/2020 09:32 AM   MICROALBUR 0.5 08/25/2020 09:20 AM   MICROALBUR 0.3 09/06/2018 02:11 PM     Patient is currently controlled on the following medications:   Metformin XR 500 mg daily   Last diabetic Foot exam:  Lab Results  Component Value Date/Time   HMDIABEYEEXA No Retinopathy  01/14/2017 12:00 AM    Last diabetic Eye exam: No results found for: HMDIABFOOTEX   We discussed:   Plan  Continue current medications   Osteopenia   Last DEXA Scan: 08/14/19   T-Score femoral neck: -1.7 (L)  T-Score total hip: -1.7 (L)  T-Score lumbar spine: -1.7  T-Score forearm radius: n/a  10-year probability of major osteoporotic fracture: 10%  10-year probability of hip fracture: 1.4%  Vit D, 25-Hydroxy  Date Value Ref Range Status  05/31/2016 52 30 - 100 ng/mL Final    Comment:    Vitamin D Status           25-OH Vitamin D        Deficiency                <20 ng/mL        Insufficiency         20 - 29 ng/mL        Optimal             > or = 30 ng/mL   For 25-OH Vitamin D testing on patients on D2-supplementation and patients for whom quantitation of D2 and D3 fractions is required, the QuestAssureD 25-OH VIT D, (D2,D3), LC/MS/MS is recommended: order code [phone] (patients > 2 yrs).      Patient is not a candidate for pharmacologic treatment  Patient has failed these meds in past: NA Patient is currently controlled on the following medications:   Viactiv twice daily   We discussed:   Plan  Continue current medications   Medication Management   Pt uses [facility] pharmacy for all medications Uses pill box? Yes Pt endorses 100% compliance  We discussed:  Plan  Continue current medication management strategy  Follow up: 7 month phone visit  [facility] [phone]

## 2020-09-16 NOTE — Patient Instructions (Signed)
Visit Information It was great speaking with you today!  Please let me know if you have any questions about our visit. Goals Addressed            This Visit's Progress   . Chronic Care Management       CARE PLAN ENTRY (see longitudinal plan of care for additional care plan information)  Current Barriers:  . Chronic Disease Management support, education, and care coordination needs related to Hypertension, Hyperlipidemia, Diabetes, GERD, and Osteopenia   Hypertension BP Readings from Last 3 Encounters:  09/16/20 130/90  08/19/20 115/70  08/18/20 122/84   . Pharmacist Clinical Goal(s): o Over the next 90 days, patient will work with PharmD and providers to maintain BP goal <140/90 . Current regimen:  o Lisinopril 5 mg daily . Interventions: o Discussed low salt diet and exercising as tolerated extensively o Will initiate blood pressure monitoring plan  . Patient self care activities - Over the next 90 days, patient will: o Check Blood Pressure 1-2 times monthly, document, and provide at future appointments o Ensure daily salt intake < 2300 mg/day  Hyperlipidemia Lab Results  Component Value Date/Time   LDLCALC 54 02/15/2020 09:32 AM   . Pharmacist Clinical Goal(s): o Over the next 90 days, patient will work with PharmD and providers to maintain LDL goal < 100 . Current regimen:  o Simvastatin 40 mg nightly . Interventions: o Discussed low cholesterol diet and exercising as tolerated extensively o Will initiate cholesterol monitoring plan  . Patient self care activities - Over the next 90 days, patient will: o Continue taking simvastatin at night  Diabetes Lab Results  Component Value Date/Time   HGBA1C 5.9 (H) 08/18/2020 09:49 AM   HGBA1C 5.7 (H) 02/15/2020 09:32 AM   . Pharmacist Clinical Goal(s): o Over the next 90 days, patient will work with PharmD and providers to maintain A1c goal <7% . Current regimen:  o Metformin 500 mg  daily . Interventions: o Discussed carbohydrate counting and exercising as tolerated extensively o Will initiate blood sugar monitoring plan  . Patient self care activities - Over the next 90 days, patient will: o Check blood sugar when symptomatic, document, and provide at future appointments o Contact provider with any episodes of hypoglycemia  Medication management . Pharmacist Clinical Goal(s): o Over the next 90 days, patient will work with PharmD and providers to maintain optimal medication adherence . Current pharmacy: [facility] . Interventions o Comprehensive medication review performed. o Continue current medication management strategy . Patient self care activities - Over the next 90 days, patient will: o Take medications as prescribed o Report any questions or concerns to PharmD and/or provider(s)      The patient verbalized understanding of instructions, educational materials, and care plan provided today and declined offer to receive copy of patient instructions, educational materials, and care plan.   Telephone follow up appointment with pharmacy team member scheduled for: 05/21/21 at 3:30 PM  [facility] [phone]

## 2020-09-29 ENCOUNTER — Other Ambulatory Visit: Payer: Self-pay

## 2020-09-29 ENCOUNTER — Ambulatory Visit
Admission: RE | Admit: 2020-09-29 | Discharge: 2020-09-29 | Disposition: A | Discharge status: Home / Self Care | Payer: Medicare Other | Source: Ambulatory Visit | Attending: Family Medicine | Admitting: Family Medicine

## 2020-09-29 DIAGNOSIS — Z1231 Encounter for screening mammogram for malignant neoplasm of breast: Secondary | ICD-10-CM | POA: Diagnosis present

## 2020-09-30 ENCOUNTER — Ambulatory Visit: Payer: Medicare Other | Admitting: Obstetrics and Gynecology

## 2020-09-30 ENCOUNTER — Encounter: Payer: Self-pay | Admitting: Obstetrics and Gynecology

## 2020-09-30 VITALS — BP 122/78 | HR 97 | Ht 68.0 in | Wt 180.7 lb

## 2020-09-30 DIAGNOSIS — L900 Lichen sclerosus et atrophicus: Secondary | ICD-10-CM | POA: Diagnosis not present

## 2020-09-30 NOTE — Progress Notes (Signed)
HPI:      [name] is a 69 y.o. No obstetric history on file. who LMP was No LMP recorded. Patient is postmenopausal.  Subjective:   She presents today for follow-up of lichen sclerosus.  She reports that her posterior fourchette itching has completely resolved.  She is very happy with the clobetasol.  She has been using it twice a week successfully. She is not sexually active and has no plans for sexual activity.  She requests no internal examination.  She is not using estrogen cream as previously prescribed by PCP.    Hx: The following portions of the patient's history were reviewed and updated as appropriate:             She  has a past medical history of Aortic atherosclerosis ([place]), Atrophic vaginitis, Chest pain, COPD (chronic obstructive pulmonary disease) ([place]), Diabetes mellitus without complication ([place]), Diverticulitis, DNAR (do not attempt resuscitation) (06/09/2017), Essential hypertension, GERD (gastroesophageal reflux disease), History of kidney stones, History of tobacco use, Hyperlipidemia, IFG (impaired fasting glucose), Irregular heart beat (2018), Leukorrhea, not specified as infective, Osteopenia (05/27/2016), Personal history of tobacco use, presenting hazards to health (08/20/2015), and Pulmonary nodules. She does not have any pertinent problems on file. She  has a past surgical history that includes colonoscopy; TMJ Arthroplasty (1984); Cystoscopy with biopsy (N/A, 01/17/2017); Colonoscopy with propofol (N/A, 02/20/2018); and polypectomy (02/20/2018). Her family history includes COPD in her father; Cancer in her maternal grandfather and sister; Diabetes in her brother and mother; Hearing loss in her father and mother; Heart disease in her paternal uncle and paternal uncle; Hyperlipidemia in her maternal aunt, maternal grandmother, and mother; Hypertension in her maternal grandmother and mother; Irregular heart beat in her brother; Stroke in her maternal aunt, maternal  grandmother, and mother; Thyroid disease in her sister; Ulcerative colitis in her sister. She  reports that she quit smoking about 10 years ago. Her smoking use included cigarettes. She has a 135.00 pack-year smoking history. She has never used smokeless tobacco. She reports previous alcohol use. She reports that she does not use drugs. She has a current medication list which includes the following prescription(s): acetaminophen, aspirin ec, biotin, onetouch verio flex system, viactiv calcium plus d, cinnamon, clobetasol cream, coq10, famotidine, onetouch verio, lisinopril, lysine, melatonin, metformin, centrum silver 19+ERDEY, onetouch delica lancets 81K, OVER THE COUNTER MEDICATION, simvastatin, trubiotics, and pseudoephedrine, and the following Facility-Administered Medications: clobetasol ointment. She has No Known Allergies.       Review of Systems:  Review of Systems  Constitutional: Denied constitutional symptoms, night sweats, recent illness, fatigue, fever, insomnia and weight loss.  Eyes: Denied eye symptoms, eye pain, photophobia, vision change and visual disturbance.  Ears/Nose/Throat/Neck: Denied ear, nose, throat or neck symptoms, hearing loss, nasal discharge, sinus congestion and sore throat.  Cardiovascular: Denied cardiovascular symptoms, arrhythmia, chest pain/pressure, edema, exercise intolerance, orthopnea and palpitations.  Respiratory: Denied pulmonary symptoms, asthma, pleuritic pain, productive sputum, cough, dyspnea and wheezing.  Gastrointestinal: Denied, gastro-esophageal reflux, melena, nausea and vomiting.  Genitourinary: Denied genitourinary symptoms including symptomatic vaginal discharge, pelvic relaxation issues, and urinary complaints.  Musculoskeletal: Denied musculoskeletal symptoms, stiffness, swelling, muscle weakness and myalgia.  Dermatologic: Denied dermatology symptoms, rash and scar.  Neurologic: Denied neurology symptoms, dizziness, headache, neck pain  and syncope.  Psychiatric: Denied psychiatric symptoms, anxiety and depression.  Endocrine: Denied endocrine symptoms including hot flashes and night sweats.   Meds:   Current Outpatient Medications on File Prior to Visit  Medication Sig Dispense Refill  .  acetaminophen (TYLENOL) 500 MG tablet Take 1,000 mg by mouth every 6 (six) hours as needed (for pain.).    Marland Kitchen aspirin EC 81 MG tablet Take 81 mg by mouth at bedtime.    Marland Kitchen BIOTIN PO Take by mouth daily.    . Blood Glucose Monitoring Suppl (ONETOUCH VERIO FLEX SYSTEM) w/Device KIT Check blood sugars up to twice daily prn Dx:E11.9 LON:99 1 kit 0  . Calcium-Vitamin D-Vitamin K (VIACTIV CALCIUM PLUS D) 650-12.5-40 MG-MCG-MCG CHEW Chew 1 Troche by mouth in the morning and at bedtime.    Marland Kitchen CINNAMON PO Take 1,000 mg by mouth 2 (two) times daily.    . clobetasol cream (TEMOVATE) 0.05 % Apply to labia twice daily for 3 weeks then twice weekly as directed 60 g 2  . Coenzyme Q10 (COQ10) 100 MG CAPS One by mouth daily    . famotidine (PEPCID) 20 MG tablet TAKE 1 TABLET BY MOUTH  TWICE DAILY 180 tablet 3  . glucose blood (ONETOUCH VERIO) test strip CHECK FINGERSTICK BLOOD SUGARS THREE TIMES A WEEK 100 each 3  . lisinopril (ZESTRIL) 5 MG tablet Take 1 tablet (5 mg total) by mouth daily. for blood pressure 90 tablet 3  . LYSINE PO Take by mouth as needed.    . Melatonin 2.5 MG CHEW Chew 2.5 mg by mouth daily as needed.    . metFORMIN (GLUCOPHAGE-XR) 500 MG 24 hr tablet Take 1 tablet (500 mg total) by mouth daily with breakfast. 90 tablet 3  . Multiple Vitamins-Minerals (CENTRUM SILVER 50+WOMEN) TABS Take 1 tablet by mouth daily.    Glory Rosebush Delica Lancets 65K MISC 1 application by Does not apply route 3 (three) times a week. Dx:E11.9 LON:99 check BS 3x per week 100 each 5  . OVER THE COUNTER MEDICATION neuriva plus OTC supplement (like prevagen)    . simvastatin (ZOCOR) 40 MG tablet Take 1 tablet (40 mg total) by mouth at bedtime. 90 tablet 3  .  Probiotic Product (TRUBIOTICS) CAPS Take 1 capsule by mouth daily. (Patient not taking: Reported on 09/30/2020)    . pseudoephedrine (SUDAFED) 30 MG tablet Take 1 tablet (30 mg total) by mouth every 8 (eight) hours as needed for congestion. No more than 3 tablets/month (Patient not taking: Reported on 09/30/2020) 30 tablet 0   Current Facility-Administered Medications on File Prior to Visit  Medication Dose Route Frequency Provider Last Rate Last Admin  . clobetasol ointment (TEMOVATE) 0.05 %   Topical BID Harlin Heys, MD              Objective:     Vitals:   09/30/20 1000  BP: 122/78  Pulse: 97   Filed Weights   09/30/20 1000  Weight: 180 lb 11.2 oz (82 kg)              Physical examination   Pelvic:  Vulva: Normal appearance.  No lesions.  Some labial fusion noted-see previous  Urethra No masses tenderness or scarring.  Small caruncle  Meatus Normal size without lesions or prolapse.     Anus: Normal exam.  No lesions.  Perineum: Normal exam.  No lesions.     Assessment:    No obstetric history on file. Patient Active Problem List   Diagnosis Date Noted  . GERD without esophagitis 03/06/2019  . Benign neoplasm of sigmoid colon   . Lower extremity edema 01/31/2018  . Centrilobular emphysema ([place]) 12/02/2016  . Pulmonary nodules/lesions, multiple 12/02/2016  . Aortic atherosclerosis ([place]) 07/08/2016  .  Osteopenia 05/27/2016  . Abnormality of nail tissue 11/27/2015  . Herpes simplex antibody positive 08/22/2015  . Essential hypertension   . Type 2 diabetes mellitus, controlled ([place])   . Atrophic vaginitis   . History of tobacco use   . Abnormal EKG 07/13/2013  . Hyperlipidemia 07/13/2013     1. Lichen sclerosus et atrophicus     Patient very satisfied with clobetasol use.   Plan:            1.  Patient will continue to use clobetasol as directed.  2.  She will contact us if she has any further issues  3.  General medical care with  PCP. Orders No orders of the defined types were placed in this encounter.   No orders of the defined types were placed in this encounter.     F/U  Return for Pt to contact us if symptoms worsen. I spent 21 minutes involved in the care of this patient preparing to see the patient by obtaining and reviewing her medical history (including labs, imaging tests and prior procedures), documenting clinical information in the electronic health record (EHR), counseling and coordinating care plans, writing and sending prescriptions, ordering tests or procedures and directly communicating with the patient by discussing pertinent items from her history and physical exam as well as detailing my assessment and plan as noted above so that she has an informed understanding.  All of her questions were answered.  Finis Bud, M.D. 09/30/2020 10:36 AM

## 2020-10-26 ENCOUNTER — Encounter: Payer: Self-pay | Admitting: Family Medicine

## 2020-10-27 NOTE — Progress Notes (Signed)
Name: Felicia Cisneros   MRN: [id number]    DOB: 01/16/1951   Date:10/28/2020       Progress Note  Subjective  Chief Complaint  Chief Complaint  Patient presents with  . Cough    I connected with  Felicia Cisneros on 10/28/20 at  9:00 AM EST by telephone and verified that I am speaking with the correct person using two identifiers.  I discussed the limitations, risks, security and privacy concerns of performing an evaluation and management service by telephone and the availability of in person appointments. The patient expressed understanding and agreed to proceed. Staff also discussed with the patient that there may be a patient responsible charge related to this service. Patient Location: Home Provider Location: [facility] Additional Individuals present: none  HPI  Patient is a 70 year old female patient of Felicia Cisneros She sent this message January 2:  I have congestion in my chest.  Coughing up green phlegm.  Head is clear. No temp.  bp 128/74. pulse 95.  I have received both Covid shots plus booster.  Have not been around anyone sick, as far as I know.  Do you have suggestions on what I should do, do I need over the counter medicine(I took Coricidan Max (for high blood pressure sufferers).  Had a sinus headache all night.  Upon arising, took 1 Sudafed and 2 extra strength Tylenols.  I would like to get a handle on this as quick as I can.  I live by myself and don't have a lot of extra help around.  My neighbor gave me a quart of "Jewish penicillin" (chicken soup) which I ate yesterday and this morning I'm having oatmeal.   Any help you can give me will be greatly appreciated. Felicia Cisneros, dob Oct 18, 1951 Follows up today with a phone visit.  Covid vaccination status-had vaccine plus booster Symptoms started Friday night with bad HA, and then cough developed. Symptoms include: + cough, no marked production No marked SOB No fever, feeling feverish No sore throat.  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  Works at home and isolating, lives by self. Noted missing her hair appt and pedicure.   Patient Active Problem List   Diagnosis Date Noted  . GERD without esophagitis 03/06/2019  . Benign neoplasm of sigmoid colon   . Lower extremity edema 01/31/2018  . Centrilobular emphysema (Felicia Cisneros) 12/02/2016  . Pulmonary nodules/lesions, multiple 12/02/2016  . Aortic atherosclerosis (Felicia Cisneros) 07/08/2016  . Osteopenia 05/27/2016  . Abnormality of nail tissue 11/27/2015  . Herpes simplex antibody positive 08/22/2015  . Essential hypertension   . Type 2 diabetes mellitus, controlled (Felicia Cisneros)   . Atrophic vaginitis   . History of tobacco use   . Abnormal EKG 07/13/2013  . Hyperlipidemia 07/13/2013    Past Surgical History:  Procedure Laterality Date  . colonoscopy    . COLONOSCOPY WITH PROPOFOL N/A 02/20/2018   Procedure: COLONOSCOPY WITH PROPOFOL;  Surgeon: Lucilla Lame, MD;  Location: [facility];  Service: Endoscopy;  Laterality: N/A;  Diabetic  . CYSTOSCOPY WITH BIOPSY N/A 01/17/2017   Procedure: CYSTOSCOPY WITH BIOPSY;  Surgeon: Hollice Espy, MD;  Location: [facility];  Service: Urology;  Laterality: N/A;  . POLYPECTOMY  02/20/2018   Procedure: POLYPECTOMY;  Surgeon: Lucilla Lame, MD;  Location: [place];  Service: Endoscopy;;  . TMJ ARTHROPLASTY  1984    Family History  Problem Relation Age of Onset  . Stroke Mother   . Hypertension Mother   . Hyperlipidemia  Mother   . Diabetes Mother        pre-diabetic  . Hearing loss Mother   . Diabetes Brother   . Irregular heart beat Brother   . COPD Father   . Hearing loss Father   . Thyroid disease Sister   . Stroke Maternal Grandmother   . Hyperlipidemia Maternal Grandmother   . Hypertension Maternal Grandmother   . Cancer Maternal Grandfather        bone  . Cancer Sister        cystic fibroid carcinoma  . Ulcerative colitis Sister   . Heart disease Paternal Uncle   . Heart disease Paternal Uncle   . Hyperlipidemia Maternal Aunt   . Stroke Maternal Aunt   . Breast cancer Neg Hx   . Hematuria Neg Hx   . Prostate cancer Neg Hx   . Renal cancer Neg Hx     Social History   Tobacco Use  . Smoking status: Former Smoker    Packs/day: 3.00    Years: 45.00    Pack years: 135.00    Types: Cigarettes    Quit date: 10/25/2009    Years since quitting: 11.0  . Smokeless tobacco: Never Used  . Tobacco comment: Quit before my husband was diagnosed with small cell cancer.  Substance Use Topics  . Alcohol use: Not Currently    Alcohol/week: 0.0 standard drinks    Comment: Rarely     Current Outpatient Medications:  .  acetaminophen (TYLENOL) 500 MG tablet, Take 1,000 mg by mouth every 6 (six) hours as needed (for pain.)., Disp: , Rfl:  .  aspirin EC 81 MG tablet, Take 81 mg by mouth at bedtime., Disp: , Rfl:  .  BIOTIN PO, Take by mouth daily., Disp: , Rfl:  .  Blood Glucose Monitoring Suppl (ONETOUCH VERIO FLEX SYSTEM) w/Device KIT, Check blood sugars up to twice daily prn Dx:E11.9 LON:99, Disp: 1 kit, Rfl: 0 .  Calcium-Vitamin D-Vitamin K (VIACTIV CALCIUM PLUS D) 650-12.5-40 MG-MCG-MCG CHEW, Chew 1 Troche by mouth in the morning and at bedtime., Disp: , Rfl:  .  CINNAMON PO, Take 1,000 mg by mouth 2 (two) times daily., Disp: , Rfl:  .  Coenzyme Q10 (COQ10) 100 MG CAPS, One by mouth daily, Disp: , Rfl:  .  famotidine (PEPCID) 20 MG tablet, TAKE 1 TABLET BY MOUTH  TWICE DAILY, Disp: 180 tablet,  Rfl: 3 .  glucose blood (ONETOUCH VERIO) test strip, CHECK FINGERSTICK BLOOD SUGARS THREE TIMES A WEEK, Disp: 100 each, Rfl: 3 .  lisinopril (ZESTRIL) 5 MG tablet, Take 1 tablet (5 mg total) by mouth daily. for blood pressure, Disp: 90 tablet, Rfl: 3 .  LYSINE PO, Take by mouth as needed., Disp: , Rfl:  .  Melatonin 2.5 MG CHEW, Chew 2.5 mg by mouth daily as needed., Disp: , Rfl:  .  metFORMIN (GLUCOPHAGE-XR) 500 MG 24 hr tablet, Take 1 tablet (500 mg total) by mouth daily with breakfast.,  Disp: 90 tablet, Rfl: 3 .  Multiple Vitamins-Minerals (CENTRUM SILVER 50+WOMEN) TABS, Take 1 tablet by mouth daily., Disp: , Rfl:  .  OneTouch Delica Lancets 75I MISC, 1 application by Does not apply route 3 (three) times a week. Dx:E11.9 LON:99 check BS 3x per week, Disp: 100 each, Rfl: 5 .  OVER THE COUNTER MEDICATION, neuriva plus OTC supplement (like prevagen), Disp: , Rfl:  .  simvastatin (ZOCOR) 40 MG tablet, Take 1 tablet (40 mg total) by mouth at bedtime., Disp: 90 tablet, Rfl: 3 .  clobetasol cream (TEMOVATE) 0.05 %, Apply to labia twice daily for 3 weeks then twice weekly as directed, Disp: 60 g, Rfl: 2 .  Probiotic Product (TRUBIOTICS) CAPS, Take 1 capsule by mouth daily. (Patient not taking: No sig reported), Disp: , Rfl:  .  pseudoephedrine (SUDAFED) 30 MG tablet, Take 1 tablet (30 mg total) by mouth every 8 (eight) hours as needed for congestion. No more than 3 tablets/month (Patient not taking: No sig reported), Disp: 30 tablet, Rfl: 0  Current Facility-Administered Medications:  .  clobetasol ointment (TEMOVATE) 0.05 %, , Topical, BID, Amalia Hailey, Nyoka Lint, MD  No Known Allergies  With staff assistance, above reviewed with the patient today.  ROS: As per HPI, otherwise no specific complaints on a limited and focused system review   Objective  Virtual encounter, vitals not obtained.  There is no height or weight on file to calculate BMI.  Physical Exam   Appears in NAD via conversation,  pleasant Pulmonary/Chest: No obvious respiratory distress. Speaking in complete sentences Neurological: Pt is alert, Speech is normal Psychiatric: Patient has a normal mood and affect, behavior is normal. Judgment and thought content normal.   No results found for this or any previous visit (from the past 72 hour(s)).  PHQ2/9: Depression screen [facility] 2/9 10/28/2020 08/18/2020 07/08/2020 02/15/2020 10/02/2019  Decreased Interest 0 0 0 0 0  Down, Depressed, Hopeless 0 0 0 0 0  PHQ - 2 Score 0 0 0 0 0  Altered sleeping - - - 0 0  Tired, decreased energy - - - 0 0  Change in appetite - - - 0 0  Feeling bad or failure about yourself  - - - 0 0  Trouble concentrating - - - 0 0  Moving slowly or fidgety/restless - - - 0 0  Suicidal thoughts - - - 0 0  PHQ-9 Score - - - 0 0  Difficult doing work/chores - - - Not difficult at all Not difficult at all  Some recent data might be hidden   PHQ-2/9 Result reviewed  Fall Risk: Fall Risk  10/28/2020 08/18/2020 07/08/2020 02/15/2020 10/02/2019  Falls in the past year? 0 0 0 0 0  Comment - - - - -  Number falls in past yr: 0 0 0 0 0  Injury with Fall? 0 0 0 0 0  Risk for fall due to : - - No Fall Risks - -  Risk for fall due to: Comment - - - - -  Follow up - Falls evaluation completed Falls prevention discussed - Falls evaluation completed     Assessment & Plan 1. Cough 2. Viral syndrome Discussed with patient the concerns for this possibly being Covid, and she has a test scheduled for tomorrow evening presently.  She is remaining isolated at home, and notes she works from home.  Her symptoms have been relatively mild, with a persistent cough her only real symptom of concern.  She denies  fevers, any production to the cough, no shortness of breath or chest pains. Recommend continuing symptomatic measures, and relative rest as she is doing.  If her test returns positive, she will let us know, and close monitoring emphasized.  If her symptoms are  increasing, also needs to follow-up, and if her test for Covid is positive, discussed the possible options including antibodies and a new oral pill that may be available, although noted supplies are relatively in short supply presently.  And availability may be limited. Recommended remaining isolated until her symptoms have pretty much resolved, and when she returns to any work or settings outside the house, she should wear a mask at least for 7 more days if her Covid test is positive. She was understanding of the recommendations today.  And the importance of continuing to closely monitor.  3. Essential hypertension 4. Controlled type 2 diabetes mellitus with other circulatory complication, without long-term current use of insulin ([place]) 5. Centrilobular emphysema ([place]) 6. Mixed hyperlipidemia  Noted her above comorbidities as well.  I discussed the assessment and treatment plan with the patient. The patient was provided an opportunity to ask questions and all were answered. The patient agreed with the plan and demonstrated an understanding of the instructions.   The patient was advised to call back or seek an in-person evaluation if the symptoms worsen or if the condition fails to improve as anticipated.  I provided 20 minutes of non-face-to-face time during this encounter that included discussing at length patient's sx/history, pertinent pmhx, medications, treatment and follow up plan. This time also included the necessary documentation, orders, and chart review.  Towanda Malkin, MD

## 2020-10-28 ENCOUNTER — Encounter: Payer: Self-pay | Admitting: Internal Medicine

## 2020-10-28 ENCOUNTER — Other Ambulatory Visit: Payer: Self-pay

## 2020-10-28 ENCOUNTER — Telehealth (INDEPENDENT_AMBULATORY_CARE_PROVIDER_SITE_OTHER): Payer: Medicare Other | Admitting: Internal Medicine

## 2020-10-28 VITALS — BP 134/82 | HR 93

## 2020-10-28 DIAGNOSIS — E782 Mixed hyperlipidemia: Secondary | ICD-10-CM

## 2020-10-28 DIAGNOSIS — E1159 Type 2 diabetes mellitus with other circulatory complications: Secondary | ICD-10-CM

## 2020-10-28 DIAGNOSIS — R059 Cough, unspecified: Secondary | ICD-10-CM | POA: Diagnosis not present

## 2020-10-28 DIAGNOSIS — I10 Essential (primary) hypertension: Secondary | ICD-10-CM

## 2020-10-28 DIAGNOSIS — B349 Viral infection, unspecified: Secondary | ICD-10-CM | POA: Diagnosis not present

## 2020-10-28 DIAGNOSIS — J432 Centrilobular emphysema: Secondary | ICD-10-CM

## 2020-10-29 ENCOUNTER — Encounter: Payer: Self-pay | Admitting: Internal Medicine

## 2020-11-01 ENCOUNTER — Encounter: Payer: Self-pay | Admitting: Internal Medicine

## 2020-11-03 NOTE — Progress Notes (Signed)
Name: Felicia Cisneros   MRN: [id number]    DOB: 01/27/1951   Date:11/04/2020       Progress Note  Subjective  Chief Complaint  Chief Complaint  Patient presents with  . Cough    I connected with  Felicia Cisneros on 11/04/20 at  2:40 PM EST by telephone and verified that I am speaking with the correct person using two identifiers.  I discussed the limitations, risks, security and privacy concerns of performing an evaluation and management service by telephone and the availability of in person appointments. The patient expressed understanding and agreed to proceed. Staff also discussed with the patient that there may be a patient responsible charge related to this service. Patient Location: Home Provider Location: [facility] Additional Individuals present: none  HPI Patient is a 70 year old female patient of Felicia Cisneros F/u's today after our telephone visit on 10/29/19 At that time, recommendations were as foillows:  Her symptoms have been relatively mild, with a persistent cough her only real symptom of concern.  She denies fevers, any production to the cough, no shortness of breath or chest pains. Recommend continuing symptomatic measures, and relative rest as she is doing.  If her test returns positive, she will let us know, and close monitoring emphasized.  If her symptoms are increasing, also needs to follow-up, and if her test for Covid is positive, discussed the possible options including antibodies and a new oral pill that may be available, although noted supplies are relatively in short supply presently.  And availability may be limited. Recommended remaining isolated until her symptoms have pretty much resolved, and when she returns to any work or settings outside the house, she should wear a mask at least for 7 more days if her Covid test is positive.  She sent this message on 1/05/26/21 (and I recommended a f/u visit to help re-assess):Marland Kitchen  I had an audio appointment with you recently.   Since that time I have gotten 2 Covid tests, rapid(negative) and the other, no results yet.  Today is 72 hrs.  I had a chest cold when I talked to you.  It has gotten worse, cough worse, pflegm is green, not clear. No fever, don't feel bad, just need to know if I need something for this cough before it develops into something much worse, bronchitis or pneumonia.  Might be erring on the side of my mind overthinking this. Your reply will be greatly appreciated. Felicia Cisneros DOB 1950/11/15  Noted still waiting for Covid PCR test result.  Covid vaccination status-had vaccine plus booster Symptoms started Friday a week ago with bad HA, and then cough developed.  Symptoms include: + cough, will wake her up at times coughing, + production previously and now dry No marked SOB No fever, takes temp daily No sore throat. No sinus congestion or PND  HA better now No loss of smell, loss of taste No  N/V No muscle aches No marked loose stools/diarrhea No CP, no passing out episodes  Former smoker, over a 100-pack-year history, quit 2011 Tried nothing in past couple days for cough, quit corricidin as made drowsy.  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  Works at home and isolating, lives by self.  Notes not feeling bad, just coughing  Patient Active Problem List   Diagnosis Date Noted  . GERD without esophagitis 03/06/2019  . Benign neoplasm of sigmoid colon   . Lower extremity edema 01/31/2018  . Centrilobular emphysema ([place]) 12/02/2016  . Pulmonary nodules/lesions, multiple 12/02/2016  . Aortic atherosclerosis ([place]) 07/08/2016  . Osteopenia 05/27/2016  . Abnormality of nail tissue 11/27/2015  . Herpes simplex antibody positive 08/22/2015  . Essential hypertension   . Type 2 diabetes  mellitus, controlled ([place])   . Atrophic vaginitis   . History of tobacco use   . Abnormal EKG 07/13/2013  . Hyperlipidemia 07/13/2013    Past Surgical History:  Procedure Laterality Date  . colonoscopy    . COLONOSCOPY WITH PROPOFOL N/A 02/20/2018   Procedure: COLONOSCOPY WITH PROPOFOL;  Surgeon: Lucilla Lame, MD;  Location: [place];  Service: Endoscopy;  Laterality: N/A;  Diabetic  . CYSTOSCOPY WITH BIOPSY N/A 01/17/2017   Procedure: CYSTOSCOPY WITH BIOPSY;  Surgeon: Hollice Espy, MD;  Location: [facility];  Service: Urology;  Laterality: N/A;  . POLYPECTOMY  02/20/2018   Procedure: POLYPECTOMY;  Surgeon: Lucilla Lame, MD;  Location: [place];  Service: Endoscopy;;  . TMJ ARTHROPLASTY  1984    Family History  Problem Relation Age of Onset  . Stroke Mother   . Hypertension Mother   . Hyperlipidemia Mother   . Diabetes Mother        pre-diabetic  . Hearing loss Mother   . Diabetes Brother   . Irregular heart beat Brother   . COPD Father   . Hearing loss Father   . Thyroid disease Sister   . Stroke Maternal Grandmother   . Hyperlipidemia Maternal Grandmother   . Hypertension Maternal Grandmother   . Cancer Maternal Grandfather        bone  . Cancer Sister        cystic fibroid carcinoma  . Ulcerative colitis Sister   . Heart disease Paternal Uncle   . Heart disease Paternal Uncle   . Hyperlipidemia Maternal Aunt   . Stroke Maternal Aunt   . Breast cancer Neg Hx   . Hematuria Neg Hx   . Prostate cancer Neg Hx   . Renal cancer Neg Hx     Social History   Tobacco Use  . Smoking status: Former Smoker    Packs/day: 3.00    Years: 45.00    Pack years: 135.00    Types: Cigarettes    Quit date: 10/25/2009    Years since quitting: 11.0  . Smokeless tobacco: Never Used  . Tobacco comment: Quit before my husband was diagnosed with small cell cancer.  Substance Use Topics  . Alcohol use: Not Currently    Alcohol/week: 0.0 standard drinks     Comment: Rarely     Current Outpatient Medications:  .  acetaminophen (TYLENOL) 500 MG tablet, Take 1,000 mg by mouth every 6 (six) hours as needed (for pain.)., Disp: , Rfl:  .  aspirin EC 81 MG tablet, Take 81 mg by mouth at bedtime., Disp: , Rfl:  .  BIOTIN PO, Take by mouth daily., Disp: , Rfl:  .  Blood Glucose Monitoring Suppl (ONETOUCH VERIO FLEX SYSTEM) w/Device KIT, Check blood sugars up to twice daily prn Dx:E11.9 LON:99, Disp: 1 kit, Rfl: 0 .  Calcium-Vitamin D-Vitamin K (VIACTIV CALCIUM PLUS D) 650-12.5-40 MG-MCG-MCG CHEW, Chew 1 Troche by mouth in the morning and at bedtime., Disp: , Rfl:  .  CINNAMON PO, Take 1,000 mg by mouth 2 (two) times  daily., Disp: , Rfl:  .  clobetasol cream (TEMOVATE) 0.05 %, Apply to labia twice daily for 3 weeks then twice weekly as directed, Disp: 60 g, Rfl: 2 .  Coenzyme Q10 (COQ10) 100 MG CAPS, One by mouth daily, Disp: , Rfl:  .  famotidine (PEPCID) 20 MG tablet, TAKE 1 TABLET BY MOUTH  TWICE DAILY, Disp: 180 tablet, Rfl: 3 .  glucose blood (ONETOUCH VERIO) test strip, CHECK FINGERSTICK BLOOD SUGARS THREE TIMES A WEEK, Disp: 100 each, Rfl: 3 .  lisinopril (ZESTRIL) 5 MG tablet, Take 1 tablet (5 mg total) by mouth daily. for blood pressure, Disp: 90 tablet, Rfl: 3 .  LYSINE PO, Take by mouth as needed., Disp: , Rfl:  .  Melatonin 2.5 MG CHEW, Chew 2.5 mg by mouth daily as needed., Disp: , Rfl:  .  metFORMIN (GLUCOPHAGE-XR) 500 MG 24 hr tablet, Take 1 tablet (500 mg total) by mouth daily with breakfast., Disp: 90 tablet, Rfl: 3 .  Multiple Vitamins-Minerals (CENTRUM SILVER 50+WOMEN) TABS, Take 1 tablet by mouth daily., Disp: , Rfl:  .  OneTouch Delica Lancets 19J MISC, 1 application by Does not apply route 3 (three) times a week. Dx:E11.9 LON:99 check BS 3x per week, Disp: 100 each, Rfl: 5 .  OVER THE COUNTER MEDICATION, neuriva plus OTC supplement (like prevagen), Disp: , Rfl:  .  Probiotic Product (TRUBIOTICS) CAPS, Take 1 capsule by mouth daily.,  Disp: , Rfl:  .  pseudoephedrine (SUDAFED) 30 MG tablet, Take 1 tablet (30 mg total) by mouth every 8 (eight) hours as needed for congestion. No more than 3 tablets/month, Disp: 30 tablet, Rfl: 0 .  simvastatin (ZOCOR) 40 MG tablet, Take 1 tablet (40 mg total) by mouth at bedtime., Disp: 90 tablet, Rfl: 3  Current Facility-Administered Medications:  .  clobetasol ointment (TEMOVATE) 0.05 %, , Topical, BID, Amalia Hailey, Nyoka Lint, MD  No Known Allergies  With staff assistance, above reviewed with the patient today.  ROS: As per HPI, otherwise no specific complaints on a limited and focused system review   Objective  Virtual encounter, vitals not obtained.  There is no height or weight on file to calculate BMI.  Physical Exam   Appears in NAD via conversation, occasional dry cough during our conversation, very talkative Pulmonary/Chest: No obvious respiratory distress. Speaking in complete sentences Neurological: Pt is alert, Speech is normal Psychiatric: Patient has a normal mood and affect, behavior is normal. Judgment and thought content normal.   No results found for this or any previous visit (from the past 72 hour(s)).  PHQ2/9: Depression screen [facility] 2/9 11/04/2020 10/28/2020 08/18/2020 07/08/2020 02/15/2020  Decreased Interest 0 0 0 0 0  Down, Depressed, Hopeless 0 0 0 0 0  PHQ - 2 Score 0 0 0 0 0  Altered sleeping - - - - 0  Tired, decreased energy - - - - 0  Change in appetite - - - - 0  Feeling bad or failure about yourself  - - - - 0  Trouble concentrating - - - - 0  Moving slowly or fidgety/restless - - - - 0  Suicidal thoughts - - - - 0  PHQ-9 Score - - - - 0  Difficult doing work/chores - - - - Not difficult at all  Some recent data might be hidden   PHQ-2/9 Result reviewed  Fall Risk: Fall Risk  11/04/2020 10/28/2020 08/18/2020 07/08/2020 02/15/2020  Falls in the past year? 0 0 0 0 0  Comment - - - - -  Number falls in past yr: 0 0 0 0 0  Injury with Fall? 0 0 0 0 0   Risk for fall due to : - - - No Fall Risks -  Risk for fall due to: Comment - - - - -  Follow up - - Falls evaluation completed Falls prevention discussed -     Assessment & Plan  1. Cough/2. Viral upper respiratory tract infection  Patient noted that he is feeling better, although still has this persistent dry cough.  Notes is nonproductive, no fevers.  Discussed this is likely a postinfectious cough, often termed postinfectious asthma as it is most likely inflammation that is causing the persistent cough. Do feel that the addition of a steroid inhaler may be helpful, and prescribed to use twice daily.  Noted the importance of technique with utilizing this and reviewed that with her on the phone today. Also can use a Mucinex or Robitussin product as an expectorant to help in the short-term. Do not feel oral steroids is best with her diabetes history, and she was in agreement with that. She also did not want a cough suppressant with a narcotic like codeine which will better suppress the cough, but also make her drowsy, and she noted she did not want anything with a narcotic in it. Did note this can take some weeks not days to gradually resolve, often is self-limiting, and she does have a tobacco history which puts her at higher risk for this as well. She should follow-up if symptoms not improving or more problematic over time, especially if she starts to have other symptoms in combination again such as production to the cough, fevers, wheezing, or more shortness of breath.  - fluticasone (FLOVENT HFA) 110 MCG/ACT inhaler; Inhale 2 puffs into the lungs 2 (two) times daily. Rinse out mouth and spit after each use  Dispense: 1 each; Refill: 1  Await her response.  She is fairly certain that a second COVID test will be negative, although she states it has been 7 days now since she has been tested and waiting for that result.   I discussed the assessment and treatment plan with the patient. The  patient was provided an opportunity to ask questions and all were answered. The patient agreed with the plan and demonstrated an understanding of the instructions.  Red flags and when to present for emergency care or RTC including fevers, chest pain, shortness of breath, new/worsening/un-resolving symptoms reviewed with patient at time of visit.   The patient was advised to call back or seek an in-person evaluation if the symptoms worsen or if the condition fails to improve as anticipated.  I provided 20 minutes of non-face-to-face time during this encounter that included discussing at length patient's sx/history, pertinent pmhx, medications, treatment and follow up plan. This time also included the necessary documentation, orders, and chart review.  Towanda Malkin, MD

## 2020-11-04 ENCOUNTER — Telehealth (INDEPENDENT_AMBULATORY_CARE_PROVIDER_SITE_OTHER): Payer: Medicare Other | Admitting: Internal Medicine

## 2020-11-04 ENCOUNTER — Telehealth: Payer: Self-pay

## 2020-11-04 DIAGNOSIS — J069 Acute upper respiratory infection, unspecified: Secondary | ICD-10-CM | POA: Diagnosis not present

## 2020-11-04 DIAGNOSIS — R059 Cough, unspecified: Secondary | ICD-10-CM | POA: Diagnosis not present

## 2020-11-04 MED ORDER — FLUTICASONE PROPIONATE HFA 110 MCG/ACT IN AERO: 2.0000 | INHALATION_SPRAY | Freq: Two times a day (BID) | RESPIRATORY_TRACT | 1 refills | Status: DC

## 2020-11-04 NOTE — Telephone Encounter (Signed)
Copied from [place] [id number]. Topic: General - Other >> Nov 04, 2020 12:04 PM Leward Quan A wrote: Reason for CRM: Patient would like a call back from a nurse she is a bit confused about her visit for this afternoon. Please call her at Ph# [phone]

## 2020-11-06 ENCOUNTER — Encounter: Payer: Self-pay | Admitting: Internal Medicine

## 2020-11-10 ENCOUNTER — Ambulatory Visit: Payer: Medicare Other | Admitting: Family Medicine

## 2020-11-11 ENCOUNTER — Ambulatory Visit: Payer: Medicare Other | Admitting: Family Medicine

## 2020-11-12 ENCOUNTER — Telehealth (INDEPENDENT_AMBULATORY_CARE_PROVIDER_SITE_OTHER): Payer: Medicare Other | Admitting: Family Medicine

## 2020-11-12 DIAGNOSIS — J069 Acute upper respiratory infection, unspecified: Secondary | ICD-10-CM

## 2020-11-12 MED ORDER — LORATADINE 10 MG PO TABS: 10.0000 mg | ORAL_TABLET | Freq: Every day | ORAL | 11 refills | Status: DC

## 2020-11-12 MED ORDER — DOXYCYCLINE HYCLATE 100 MG PO TABS: 100.0000 mg | ORAL_TABLET | Freq: Two times a day (BID) | ORAL | 0 refills | Status: AC

## 2020-11-12 MED ORDER — PREDNISONE 20 MG PO TABS: 40.0000 mg | ORAL_TABLET | Freq: Every day | ORAL | 0 refills | Status: AC

## 2020-11-12 NOTE — Patient Instructions (Addendum)
You can try mucinex or delsym or robitussin   I recommend trying a daily 24 hour second generation antihistamine like claritin, zyrtec or allegra or generic equivalent   Since you have been symptomatic for so long I have sent in an antibiotic that would treat a sinus infection and also have good coverage for any bacterial infection in your lungs.   It sounds like you could have bronchitis and the steroid - prednisone - is to treat that if you continue to have cough, or I would start it with any wheeze, chest tightness or worsening cough

## 2020-11-12 NOTE — Progress Notes (Signed)
Name: Felicia Cisneros   MRN: [id number]    DOB: [birth year]   Date:11/12/2020       Progress Note  Subjective:    Chief Complaint  No chief complaint on file.   I connected with  Felicia Cisneros  on 11/12/20 at  1:20 PM EST by a video enabled telemedicine application and verified that I am speaking with the correct person using two identifiers.  I discussed the limitations of evaluation and management by telemedicine and the availability of in person appointments. The patient expressed understanding and agreed to proceed. Staff also discussed with the patient that there may be a patient responsible charge related to this service. Patient Location:  home Provider Location: home office Additional Individuals present: none  HPI Cough for a month, stuffed up head with congestion Sneezing coughing,  No chest pain, no headache Cough is non-productive  She is using Flovent HFA 110 daily She feels like she has a cold Blood sugars a little elevated         Patient Active Problem List   Diagnosis Date Noted  . GERD without esophagitis 03/06/2019  . Benign neoplasm of sigmoid colon   . Lower extremity edema 01/31/2018  . Centrilobular emphysema ([place]) 12/02/2016  . Pulmonary nodules/lesions, multiple 12/02/2016  . Aortic atherosclerosis ([place]) 07/08/2016  . Osteopenia 05/27/2016  . Abnormality of nail tissue 11/27/2015  . Herpes simplex antibody positive 08/22/2015  . Essential hypertension   . Type 2 diabetes mellitus, controlled ([place])   . Atrophic vaginitis   . History of tobacco use   . Abnormal EKG 07/13/2013  . Hyperlipidemia 07/13/2013    Social History   Tobacco Use  . Smoking status: Former Smoker    Packs/day: 3.00    Years: 45.00    Pack years: 135.00    Types: Cigarettes    Quit date: 10/25/2009    Years since quitting: 11.0  . Smokeless tobacco: Never Used  . Tobacco comment: Quit before my husband was diagnosed with small cell cancer.  Substance Use  Topics  . Alcohol use: Not Currently    Alcohol/week: 0.0 standard drinks    Comment: Rarely     Current Outpatient Medications:  .  acetaminophen (TYLENOL) 500 MG tablet, Take 1,000 mg by mouth every 6 (six) hours as needed (for pain.)., Disp: , Rfl:  .  aspirin EC 81 MG tablet, Take 81 mg by mouth at bedtime., Disp: , Rfl:  .  BIOTIN PO, Take by mouth daily., Disp: , Rfl:  .  Blood Glucose Monitoring Suppl (ONETOUCH VERIO FLEX SYSTEM) w/Device KIT, Check blood sugars up to twice daily prn Dx:E11.9 LON:99, Disp: 1 kit, Rfl: 0 .  Calcium-Vitamin D-Vitamin K (VIACTIV CALCIUM PLUS D) 650-12.5-40 MG-MCG-MCG CHEW, Chew 1 Troche by mouth in the morning and at bedtime., Disp: , Rfl:  .  CINNAMON PO, Take 1,000 mg by mouth 2 (two) times daily., Disp: , Rfl:  .  clobetasol cream (TEMOVATE) 0.05 %, Apply to labia twice daily for 3 weeks then twice weekly as directed, Disp: 60 g, Rfl: 2 .  Coenzyme Q10 (COQ10) 100 MG CAPS, One by mouth daily, Disp: , Rfl:  .  famotidine (PEPCID) 20 MG tablet, TAKE 1 TABLET BY MOUTH  TWICE DAILY, Disp: 180 tablet, Rfl: 3 .  fluticasone (FLOVENT HFA) 110 MCG/ACT inhaler, Inhale 2 puffs into the lungs 2 (two) times daily. Rinse out mouth and spit after each use, Disp: 1 each, Rfl: 1 .  glucose  blood (ONETOUCH VERIO) test strip, CHECK FINGERSTICK BLOOD SUGARS THREE TIMES A WEEK, Disp: 100 each, Rfl: 3 .  lisinopril (ZESTRIL) 5 MG tablet, Take 1 tablet (5 mg total) by mouth daily. for blood pressure, Disp: 90 tablet, Rfl: 3 .  LYSINE PO, Take by mouth as needed., Disp: , Rfl:  .  Melatonin 2.5 MG CHEW, Chew 2.5 mg by mouth daily as needed., Disp: , Rfl:  .  metFORMIN (GLUCOPHAGE-XR) 500 MG 24 hr tablet, Take 1 tablet (500 mg total) by mouth daily with breakfast., Disp: 90 tablet, Rfl: 3 .  Multiple Vitamins-Minerals (CENTRUM SILVER 50+WOMEN) TABS, Take 1 tablet by mouth daily., Disp: , Rfl:  .  OneTouch Delica Lancets 16X MISC, 1 application by Does not apply route 3  (three) times a week. Dx:E11.9 LON:99 check BS 3x per week, Disp: 100 each, Rfl: 5 .  OVER THE COUNTER MEDICATION, neuriva plus OTC supplement (like prevagen), Disp: , Rfl:  .  Probiotic Product (TRUBIOTICS) CAPS, Take 1 capsule by mouth daily., Disp: , Rfl:  .  pseudoephedrine (SUDAFED) 30 MG tablet, Take 1 tablet (30 mg total) by mouth every 8 (eight) hours as needed for congestion. No more than 3 tablets/month, Disp: 30 tablet, Rfl: 0 .  simvastatin (ZOCOR) 40 MG tablet, Take 1 tablet (40 mg total) by mouth at bedtime., Disp: 90 tablet, Rfl: 3  Current Facility-Administered Medications:  .  clobetasol ointment (TEMOVATE) 0.05 %, , Topical, BID, Amalia Hailey, Nyoka Lint, MD  No Known Allergies  I personally reviewed active problem list, medication list, allergies, family history, social history, health maintenance, notes from last encounter, lab results, imaging with the patient/caregiver today.   Review of Systems  Constitutional: Negative.   HENT: Negative.   Eyes: Negative.   Respiratory: Negative.   Cardiovascular: Negative.   Gastrointestinal: Negative.   Endocrine: Negative.   Genitourinary: Negative.   Musculoskeletal: Negative.   Skin: Negative.   Allergic/Immunologic: Negative.   Neurological: Negative.   Hematological: Negative.   Psychiatric/Behavioral: Negative.   All other systems reviewed and are negative.     Objective:   Virtual encounter, vitals limited, only able to obtain the following There were no vitals filed for this visit. There is no height or weight on file to calculate BMI. Nursing Note and Vital Signs reviewed.  Physical Exam Vitals and nursing note reviewed.  Constitutional:      General: She is not in acute distress.    Appearance: She is not ill-appearing, toxic-appearing or diaphoretic.  Pulmonary:     Effort: No respiratory distress.     Comments: Able to speak in full and complete sentences, no audible wheeze or stridor, intermittent  coughing Neurological:     Mental Status: She is alert.  Psychiatric:        Mood and Affect: Mood normal.        Behavior: Behavior normal.     PE limited by telephone encounter  No results found for this or any previous visit (from the past 72 hour(s)).  Assessment and Plan:     ICD-10-CM   1. Upper respiratory tract infection, unspecified type  J06.9 doxycycline (VIBRA-TABS) 100 MG tablet    loratadine (CLARITIN) 10 MG tablet    predniSONE (DELTASONE) 20 MG tablet  prolonged URI sx with worsening sinus sx, onset a month ago, cough persistent, not improving with hx of lung disease Cover with doxy for sinusitis (sx lasting more than 10-12 days and worsening appropriate to cover for ABS) Some possible  allergy component to her sx - encouraged pt to add an antihistamine Add prednisone if any worsening bronchitis sx - she may for now continue inhaler and see if OTC cough meds and doxy help.  Recheck in office in 2 weeks  F/up sooner if worsening.  Instructions given through AVS/pt instructions through mychart:  "You can try mucinex or delsym or robitussin   I recommend trying a daily 24 hour second generation antihistamine like claritin, zyrtec or allegra or generic equivalent   Since you have been symptomatic for so long I have sent in an antibiotic that would treat a sinus infection and also have good coverage for any bacterial infection in your lungs.   It sounds like you could have bronchitis and the steroid - prednisone - is to treat that if you continue to have cough, or I would start it with any wheeze, chest tightness or worsening cough"  -Red flags and when to present for emergency care or RTC including fever >101.61F, chest pain, shortness of breath, new/worsening/un-resolving symptoms, reviewed with patient at time of visit. Follow up and care instructions discussed and provided in AVS. - I discussed the assessment and treatment plan with the patient. The patient was  provided an opportunity to ask questions and all were answered. The patient agreed with the plan and demonstrated an understanding of the instructions.  I provided 25+ minutes of non-face-to-face time during this encounter.  [name], PA-C 11/12/20 1:56 PM

## 2020-11-13 ENCOUNTER — Telehealth: Payer: Self-pay

## 2020-11-13 ENCOUNTER — Encounter: Payer: Self-pay | Admitting: Family Medicine

## 2020-11-13 NOTE — Progress Notes (Signed)
Chronic Care Management Pharmacy Assistant   Name: Felicia Cisneros  MRN: [id number] DOB: 1950/12/25  Reason for Encounter:Hypertension  Disease State Call.  Patient Questions:  1.  Have you seen any other providers since your last visit? Yes, 09/30/2020 Obstetrics and Gynecology Nyoka Lint Evans,11/04/2020 Internal Medicine [facility] Hendrickson , 11/12/2020 PCP [name]  2.  Any changes in your medicines or health? No    PCP : [name], PA-C  Allergies:  No Known Allergies  Medications: Outpatient Encounter Medications as of 11/13/2020  Medication Sig Note  . acetaminophen (TYLENOL) 500 MG tablet Take 1,000 mg by mouth every 6 (six) hours as needed (for pain.).   Marland Kitchen aspirin EC 81 MG tablet Take 81 mg by mouth at bedtime.   Marland Kitchen BIOTIN PO Take by mouth daily.   . Blood Glucose Monitoring Suppl (ONETOUCH VERIO FLEX SYSTEM) w/Device KIT Check blood sugars up to twice daily prn Dx:E11.9 LON:99   . Calcium-Vitamin D-Vitamin K (VIACTIV CALCIUM PLUS D) 650-12.5-40 MG-MCG-MCG CHEW Chew 1 Troche by mouth in the morning and at bedtime.   Marland Kitchen CINNAMON PO Take 1,000 mg by mouth 2 (two) times daily.   . clobetasol cream (TEMOVATE) 0.05 % Apply to labia twice daily for 3 weeks then twice weekly as directed   . Coenzyme Q10 (COQ10) 100 MG CAPS One by mouth daily   . doxycycline (VIBRA-TABS) 100 MG tablet Take 1 tablet (100 mg total) by mouth 2 (two) times daily for 7 days.   . famotidine (PEPCID) 20 MG tablet TAKE 1 TABLET BY MOUTH  TWICE DAILY   . fluticasone (FLOVENT HFA) 110 MCG/ACT inhaler Inhale 2 puffs into the lungs 2 (two) times daily. Rinse out mouth and spit after each use   . glucose blood (ONETOUCH VERIO) test strip CHECK FINGERSTICK BLOOD SUGARS THREE TIMES A WEEK   . lisinopril (ZESTRIL) 5 MG tablet Take 1 tablet (5 mg total) by mouth daily. for blood pressure   . loratadine (CLARITIN) 10 MG tablet Take 1 tablet (10 mg total) by mouth daily.   Marland Kitchen LYSINE PO Take by mouth as  needed.   . Melatonin 2.5 MG CHEW Chew 2.5 mg by mouth daily as needed. 10/28/2020: prn  . metFORMIN (GLUCOPHAGE-XR) 500 MG 24 hr tablet Take 1 tablet (500 mg total) by mouth daily with breakfast.   . Multiple Vitamins-Minerals (CENTRUM SILVER 50+WOMEN) TABS Take 1 tablet by mouth daily.   Glory Rosebush Delica Lancets 08X MISC 1 application by Does not apply route 3 (three) times a week. Dx:E11.9 LON:99 check BS 3x per week   . OVER THE COUNTER MEDICATION neuriva plus OTC supplement (like prevagen)   . predniSONE (DELTASONE) 20 MG tablet Take 2 tablets (40 mg total) by mouth daily with breakfast for 5 days.   . Probiotic Product (TRUBIOTICS) CAPS Take 1 capsule by mouth daily.   . pseudoephedrine (SUDAFED) 30 MG tablet Take 1 tablet (30 mg total) by mouth every 8 (eight) hours as needed for congestion. No more than 3 tablets/month   . simvastatin (ZOCOR) 40 MG tablet Take 1 tablet (40 mg total) by mouth at bedtime.    Facility-Administered Encounter Medications as of 11/13/2020  Medication  . clobetasol ointment (TEMOVATE) 0.05 %    Current Diagnosis: Patient Active Problem List   Diagnosis Date Noted  . GERD without esophagitis 03/06/2019  . Benign neoplasm of sigmoid colon   . Lower extremity edema 01/31/2018  . Centrilobular emphysema ([place]) 12/02/2016  .  Pulmonary nodules/lesions, multiple 12/02/2016  . Aortic atherosclerosis ([place]) 07/08/2016  . Osteopenia 05/27/2016  . Abnormality of nail tissue 11/27/2015  . Herpes simplex antibody positive 08/22/2015  . Essential hypertension   . Type 2 diabetes mellitus, controlled ([place])   . Atrophic vaginitis   . History of tobacco use   . Abnormal EKG 07/13/2013  . Hyperlipidemia 07/13/2013    Goals Addressed   None    Reviewed chart prior to disease state call. Spoke with patient regarding BP  Recent Office Vitals: BP Readings from Last 3 Encounters:  10/28/20 134/82  09/30/20 122/78  09/16/20 130/90   Pulse Readings from Last 3  Encounters:  10/28/20 93  09/30/20 97  09/16/20 73    Wt Readings from Last 3 Encounters:  09/30/20 180 lb 11.2 oz (82 kg)  09/16/20 177 lb (80.3 kg)  08/19/20 180 lb 12.8 oz (82 kg)     Kidney Function Lab Results  Component Value Date/Time   CREATININE 0.81 08/25/2020 09:15 AM   CREATININE 0.78 08/18/2020 09:49 AM   GFRNONAA 74 08/25/2020 09:15 AM   GFRAA 86 08/25/2020 09:15 AM    BMP Latest Ref Rng & Units 08/25/2020 08/18/2020 05/14/2020  Glucose 65 - 99 mg/dL 93 94 178(H)  BUN 7 - 25 mg/dL 18 20 18   Creatinine 0.50 - 0.99 mg/dL 0.81 0.78 0.85  BUN/Creat Ratio 6 - 22 (calc) NOT APPLICABLE NOT APPLICABLE -  Sodium 924 - 146 mmol/L 141 142 139  Potassium 3.5 - 5.3 mmol/L 5.0 6.0(H) 3.6  Chloride 98 - 110 mmol/L 108 105 106  CO2 20 - 32 mmol/L 24 28 24   Calcium 8.6 - 10.4 mg/dL 9.6 10.5(H) 8.9    . Current antihypertensive regimen:  o Lisinopril 5 mg daily . How often are you checking your Blood Pressure? daily . Current home BP readings:  o Patient states her blood pressure has been ranging around 130/76. . What recent interventions/DTPs have been made by any provider to improve Blood Pressure control since last CPP Visit: None ID . Any recent hospitalizations or ED visits since last visit with CPP? No . What diet changes have been made to improve Blood Pressure Control?  o Patient states she cooks at home without using salt. Patient states she loves all kinds of vegetables.  o Vegetables: carrots, celery, cucumbers,lettuce, green beans and peas.  . What exercise is being done to improve your Blood Pressure Control?  o Patient states she loads a stove with wood at home daily.  Adherence Review: Is the patient currently on ACE/ARB medication? Yes Does the patient have >5 day gap between last estimated fill dates? Yes   Maryjean Ka  Follow-Up:  Pharmacist Review   [place] Clinical Pharmacist Assistant [phone]

## 2020-11-24 ENCOUNTER — Telehealth: Payer: Self-pay

## 2020-11-24 NOTE — Progress Notes (Signed)
Chronic Care Management Pharmacy Assistant   Name: Felicia Cisneros  MRN: [id number] DOB: November 26, 1950  Reason for Encounter: Medication Review   PCP : [name], PA-C  Allergies:  No Known Allergies  Medications: Outpatient Encounter Medications as of 11/24/2020  Medication Sig Note  . acetaminophen (TYLENOL) 500 MG tablet Take 1,000 mg by mouth every 6 (six) hours as needed (for pain.).   Marland Kitchen aspirin EC 81 MG tablet Take 81 mg by mouth at bedtime.   Marland Kitchen BIOTIN PO Take by mouth daily.   . Blood Glucose Monitoring Suppl (ONETOUCH VERIO FLEX SYSTEM) w/Device KIT Check blood sugars up to twice daily prn Dx:E11.9 LON:99   . Calcium-Vitamin D-Vitamin K (VIACTIV CALCIUM PLUS D) 650-12.5-40 MG-MCG-MCG CHEW Chew 1 Troche by mouth in the morning and at bedtime.   Marland Kitchen CINNAMON PO Take 1,000 mg by mouth 2 (two) times daily.   . clobetasol cream (TEMOVATE) 0.05 % Apply to labia twice daily for 3 weeks then twice weekly as directed   . Coenzyme Q10 (COQ10) 100 MG CAPS One by mouth daily   . famotidine (PEPCID) 20 MG tablet TAKE 1 TABLET BY MOUTH  TWICE DAILY   . fluticasone (FLOVENT HFA) 110 MCG/ACT inhaler Inhale 2 puffs into the lungs 2 (two) times daily. Rinse out mouth and spit after each use   . glucose blood (ONETOUCH VERIO) test strip CHECK FINGERSTICK BLOOD SUGARS THREE TIMES A WEEK   . lisinopril (ZESTRIL) 5 MG tablet Take 1 tablet (5 mg total) by mouth daily. for blood pressure   . loratadine (CLARITIN) 10 MG tablet Take 1 tablet (10 mg total) by mouth daily.   Marland Kitchen LYSINE PO Take by mouth as needed.   . Melatonin 2.5 MG CHEW Chew 2.5 mg by mouth daily as needed. 10/28/2020: prn  . metFORMIN (GLUCOPHAGE-XR) 500 MG 24 hr tablet Take 1 tablet (500 mg total) by mouth daily with breakfast.   . Multiple Vitamins-Minerals (CENTRUM SILVER 50+WOMEN) TABS Take 1 tablet by mouth daily.   Glory Rosebush Delica Lancets 44R MISC 1 application by Does not apply route 3 (three) times a week. Dx:E11.9 LON:99  check BS 3x per week   . OVER THE COUNTER MEDICATION neuriva plus OTC supplement (like prevagen)   . Probiotic Product (TRUBIOTICS) CAPS Take 1 capsule by mouth daily.   . pseudoephedrine (SUDAFED) 30 MG tablet Take 1 tablet (30 mg total) by mouth every 8 (eight) hours as needed for congestion. No more than 3 tablets/month   . simvastatin (ZOCOR) 40 MG tablet Take 1 tablet (40 mg total) by mouth at bedtime.    Facility-Administered Encounter Medications as of 11/24/2020  Medication  . clobetasol ointment (TEMOVATE) 0.05 %    Current Diagnosis: Patient Active Problem List   Diagnosis Date Noted  . GERD without esophagitis 03/06/2019  . Benign neoplasm of sigmoid colon   . Lower extremity edema 01/31/2018  . Centrilobular emphysema ([place]) 12/02/2016  . Pulmonary nodules/lesions, multiple 12/02/2016  . Aortic atherosclerosis ([place]) 07/08/2016  . Osteopenia 05/27/2016  . Abnormality of nail tissue 11/27/2015  . Herpes simplex antibody positive 08/22/2015  . Essential hypertension   . Type 2 diabetes mellitus, controlled ([place])   . Atrophic vaginitis   . History of tobacco use   . Abnormal EKG 07/13/2013  . Hyperlipidemia 07/13/2013    Goals Addressed   None    Performed cost analysis for patient, estimated yearly medication cost of $0.00.  Follow-Up:  Pharmacist Review  [place] Pharmacist Assistant [phone]

## 2020-12-08 ENCOUNTER — Encounter: Payer: Self-pay | Admitting: Family Medicine

## 2020-12-08 LAB — HM DIABETES EYE EXAM

## 2020-12-14 ENCOUNTER — Encounter: Payer: Self-pay | Admitting: Family Medicine

## 2020-12-31 DIAGNOSIS — H33011 Retinal detachment with single break, right eye: Secondary | ICD-10-CM | POA: Insufficient documentation

## 2021-01-01 ENCOUNTER — Telehealth: Payer: Self-pay

## 2021-01-01 NOTE — Progress Notes (Signed)
Chronic Care Management Pharmacy Assistant   Name: Felicia Cisneros  MRN: [id number] DOB: [birth year]  Reason for Encounter:Diabetes Disease State Call.   Conditions to be addressed/monitored: Diabetes  Primary concerns for visit include: Diabetes   Recent office visits:  No recent office visit.  Recent consult visits:  No recent consult visit.  Hospital visits:  None in previous 6 months  Medications: Outpatient Encounter Medications as of 01/01/2021  Medication Sig Note   acetaminophen (TYLENOL) 500 MG tablet Take 1,000 mg by mouth every 6 (six) hours as needed (for pain.).    aspirin EC 81 MG tablet Take 81 mg by mouth at bedtime.    BIOTIN PO Take by mouth daily.    Blood Glucose Monitoring Suppl (ONETOUCH VERIO FLEX SYSTEM) w/Device KIT Check blood sugars up to twice daily prn Dx:E11.9 LON:99    Calcium-Vitamin D-Vitamin K (VIACTIV CALCIUM PLUS D) 650-12.5-40 MG-MCG-MCG CHEW Chew 1 Troche by mouth in the morning and at bedtime.    CINNAMON PO Take 1,000 mg by mouth 2 (two) times daily.    clobetasol cream (TEMOVATE) 0.05 % Apply to labia twice daily for 3 weeks then twice weekly as directed    Coenzyme Q10 (COQ10) 100 MG CAPS One by mouth daily    famotidine (PEPCID) 20 MG tablet TAKE 1 TABLET BY MOUTH  TWICE DAILY    fluticasone (FLOVENT HFA) 110 MCG/ACT inhaler Inhale 2 puffs into the lungs 2 (two) times daily. Rinse out mouth and spit after each use    glucose blood (ONETOUCH VERIO) test strip CHECK FINGERSTICK BLOOD SUGARS THREE TIMES A WEEK    lisinopril (ZESTRIL) 5 MG tablet Take 1 tablet (5 mg total) by mouth daily. for blood pressure    loratadine (CLARITIN) 10 MG tablet Take 1 tablet (10 mg total) by mouth daily.    LYSINE PO Take by mouth as needed.    Melatonin 2.5 MG CHEW Chew 2.5 mg by mouth daily as needed. 10/28/2020: prn   metFORMIN (GLUCOPHAGE-XR) 500 MG 24 hr tablet Take 1 tablet (500 mg total) by mouth daily with breakfast.    Multiple  Vitamins-Minerals (CENTRUM SILVER 50+WOMEN) TABS Take 1 tablet by mouth daily.    OneTouch Delica Lancets 20E MISC 1 application by Does not apply route 3 (three) times a week. Dx:E11.9 LON:99 check BS 3x per week    OVER THE COUNTER MEDICATION neuriva plus OTC supplement (like prevagen)    Probiotic Product (TRUBIOTICS) CAPS Take 1 capsule by mouth daily.    pseudoephedrine (SUDAFED) 30 MG tablet Take 1 tablet (30 mg total) by mouth every 8 (eight) hours as needed for congestion. No more than 3 tablets/month    simvastatin (ZOCOR) 40 MG tablet Take 1 tablet (40 mg total) by mouth at bedtime.    Facility-Administered Encounter Medications as of 01/01/2021  Medication   clobetasol ointment (TEMOVATE) 0.05 %    Star Rating Drugs: Lisinopril,metformin, simvastatin  Recent Relevant Labs: Lab Results  Component Value Date/Time   HGBA1C 5.9 (H) 08/18/2020 09:49 AM   HGBA1C 5.7 (H) 02/15/2020 09:32 AM   MICROALBUR 0.5 08/25/2020 09:20 AM   MICROALBUR 0.3 09/06/2018 02:11 PM    Kidney Function Lab Results  Component Value Date/Time   CREATININE 0.81 08/25/2020 09:15 AM   CREATININE 0.78 08/18/2020 09:49 AM   GFRNONAA 74 08/25/2020 09:15 AM   GFRAA 86 08/25/2020 09:15 AM     Current antihyperglycemic regimen:  ? Metformin 500 mg daily  What recent interventions/DTPs  have been made to improve glycemic control:  o None ID  Have there been any recent hospitalizations or ED visits since last visit with CPP? No  Patient denies hypoglycemic symptoms, including Pale, Sweaty, Shaky, Hungry, Nervous/irritable and Vision changes  Patient denies hyperglycemic symptoms, including blurry vision, excessive thirst, fatigue, polyuria and weakness  How often are you checking your blood sugar? once daily  What are your blood sugars ranging?  o Fasting: N/A o Before meals:  - On 01/02/2021 it was 103 - Patient did not give dates ,135,85,109,108. o After meals: N/A o Bedtime:  N/A  During the week, how often does your blood glucose drop below 70? Never  Are you checking your feet daily/regularly?   Patient denies pain,numbness or tingling sensations in her feet. Adherence Review: Is the patient currently on a STATIN medication? Yes Is the patient currently on ACE/ARB medication? Yes Does the patient have >5 day gap between last estimated fill dates? No  Patient states she had eye surgery on 01/01/2021 for a retina tear.Patient reports she is recovery well.  [place] Pharmacist Assistant [phone]

## 2021-01-13 ENCOUNTER — Encounter: Payer: Self-pay | Admitting: Family Medicine

## 2021-01-23 ENCOUNTER — Other Ambulatory Visit: Payer: Self-pay | Admitting: Family Medicine

## 2021-01-23 DIAGNOSIS — I10 Essential (primary) hypertension: Secondary | ICD-10-CM

## 2021-01-23 DIAGNOSIS — E1159 Type 2 diabetes mellitus with other circulatory complications: Secondary | ICD-10-CM

## 2021-02-17 ENCOUNTER — Ambulatory Visit (INDEPENDENT_AMBULATORY_CARE_PROVIDER_SITE_OTHER): Payer: Medicare Other | Admitting: Family Medicine

## 2021-02-17 ENCOUNTER — Other Ambulatory Visit: Payer: Self-pay

## 2021-02-17 ENCOUNTER — Encounter: Payer: Self-pay | Admitting: Family Medicine

## 2021-02-17 VITALS — BP 128/72 | HR 82 | Temp 98.1°F | Resp 16 | Ht 68.0 in | Wt 174.9 lb

## 2021-02-17 DIAGNOSIS — E1159 Type 2 diabetes mellitus with other circulatory complications: Secondary | ICD-10-CM | POA: Diagnosis not present

## 2021-02-17 DIAGNOSIS — I10 Essential (primary) hypertension: Secondary | ICD-10-CM

## 2021-02-17 DIAGNOSIS — I700 Atherosclerosis of aorta: Secondary | ICD-10-CM

## 2021-02-17 DIAGNOSIS — E782 Mixed hyperlipidemia: Secondary | ICD-10-CM

## 2021-02-17 DIAGNOSIS — J432 Centrilobular emphysema: Secondary | ICD-10-CM | POA: Diagnosis not present

## 2021-02-17 DIAGNOSIS — R1012 Left upper quadrant pain: Secondary | ICD-10-CM

## 2021-02-17 DIAGNOSIS — Z5181 Encounter for therapeutic drug level monitoring: Secondary | ICD-10-CM

## 2021-02-17 DIAGNOSIS — K219 Gastro-esophageal reflux disease without esophagitis: Secondary | ICD-10-CM

## 2021-02-17 DIAGNOSIS — E042 Nontoxic multinodular goiter: Secondary | ICD-10-CM

## 2021-02-17 DIAGNOSIS — M85852 Other specified disorders of bone density and structure, left thigh: Secondary | ICD-10-CM

## 2021-02-17 MED ORDER — OMEPRAZOLE 20 MG PO CPDR: 20.0000 mg | DELAYED_RELEASE_CAPSULE | Freq: Every day | ORAL | 2 refills | Status: DC | PRN

## 2021-02-17 NOTE — Progress Notes (Signed)
Name: Felicia Cisneros   MRN: [id number]    DOB: 1951/10/20   Date:02/17/2021       Progress Note  Chief Complaint  Patient presents with   Follow-up   Diabetes   Hyperlipidemia     Subjective:   Felicia Cisneros is a 70 y.o. female, presents to clinic for routine f/up  DM Lab Results  Component Value Date   HGBA1C 5.9 (H) 08/18/2020  on metoformin 500 mg once daily, on ACEI-lisinopril and statin - simvastatin Foot exam and A1C due today  HLD Lab Results  Component Value Date   CHOL 131 02/15/2020   HDL 57 02/15/2020   LDLCALC 54 02/15/2020   TRIG 121 02/15/2020   CHOLHDL 2.3 02/15/2020   GERd - on pepcid bid  Osteopenia - on supplements Last labs calcium a little elevated Last vitamin D Lab Results  Component Value Date   VD25OH 52 05/31/2016  no hx of vit d deficiency  Thyroid nodules - due for monitoring [place] sept this year  Hypertension:  Currently managed on lisinopril 5 mg Pt reports good med compliance and denies any SE.   Blood pressure today is well controlled. BP Readings from Last 3 Encounters:  02/17/21 128/72  10/28/20 134/82  09/30/20 122/78   Pt denies CP, SOB, exertional sx, LE edema, palpitation, Ha's, visual disturbances, lightheadedness, hypotension, syncope. Dietary efforts for BP?  Walking, loosing some weight   Reviewed HM today with pt Health Maintenance  Topic Date Due   COVID-19 Vaccine (3 - Booster for Pfizer series) 07/10/2020   HEMOGLOBIN A1C  02/16/2021   PNA vac Low Risk Adult (2 of 2 - PPSV23) 02/17/2022 (Originally 11/26/2020)   INFLUENZA VACCINE  05/25/2021   DEXA SCAN  08/13/2021   MAMMOGRAM  09/29/2021   OPHTHALMOLOGY EXAM  12/08/2021   FOOT EXAM  02/17/2022   COLONOSCOPY (Pts 45-68yr Insurance coverage will need to be confirmed)  02/21/2028   TETANUS/TDAP  07/10/2029   Hepatitis C Screening  Completed   HPV VACCINES  Aged Out   Weight stable - no obesity- some weight loss Wt Readings from Last 5 Encounters:   02/17/21 174 lb 14.4 oz (79.3 kg)  09/30/20 180 lb 11.2 oz (82 kg)  09/16/20 177 lb (80.3 kg)  08/19/20 180 lb 12.8 oz (82 kg)  08/18/20 179 lb 8 oz (81.4 kg)   BMI Readings from Last 5 Encounters:  02/17/21 26.59 kg/m  09/30/20 27.48 kg/m  09/16/20 26.91 kg/m  08/19/20 27.49 kg/m  08/18/20 27.29 kg/m   abd pain - lasted a week, LUQ sharp and constant w/o changes to bowels, urination, no indigestion  Mood/phq reviewed  Depression screen Felicia Cisneros 02/17/2021 11/04/2020 10/28/2020  Decreased Interest 1 0 0  Down, Depressed, Hopeless 1 0 0  PHQ - 2 Score 2 0 0  Altered sleeping 0 - -  Tired, decreased energy 1 - -  Change in appetite 0 - -  Feeling bad or failure about yourself  0 - -  Trouble concentrating 0 - -  Moving slowly or fidgety/restless 0 - -  Suicidal thoughts 0 - -  PHQ-9 Score 3 - -  Difficult doing work/chores Not difficult at all - -  Some recent data might be hidden      Current Outpatient Medications:    acetaminophen (TYLENOL) 500 MG tablet, Take 1,000 mg by mouth every 6 (six) hours as needed (for pain.)., Disp: , Rfl:    aspirin EC 81 MG tablet,  Take 81 mg by mouth at bedtime., Disp: , Rfl:    BIOTIN PO, Take by mouth daily., Disp: , Rfl:    Blood Glucose Monitoring Suppl (ONETOUCH VERIO FLEX SYSTEM) w/Device KIT, Check blood sugars up to twice daily prn Dx:E11.9 LON:99, Disp: 1 kit, Rfl: 0   Calcium-Vitamin D-Vitamin K (VIACTIV CALCIUM PLUS D) 650-12.5-40 MG-MCG-MCG CHEW, Chew 1 Troche by mouth in the morning and at bedtime., Disp: , Rfl:    clobetasol cream (TEMOVATE) 0.05 %, Apply to labia twice daily for 3 weeks then twice weekly as directed, Disp: 60 g, Rfl: 2   Coenzyme Q10 (COQ10) 100 MG CAPS, One by mouth daily, Disp: , Rfl:    famotidine (PEPCID) 20 MG tablet, TAKE 1 TABLET BY MOUTH  TWICE DAILY, Disp: 180 tablet, Rfl: 3   glucose blood (ONETOUCH VERIO) test strip, CHECK FINGERSTICK BLOOD SUGARS THREE TIMES A WEEK, Disp: 100 each, Rfl: 3    lisinopril (ZESTRIL) 5 MG tablet, TAKE 1 TABLET BY MOUTH  DAILY FOR BLOOD PRESSURE, Disp: 90 tablet, Rfl: 3   LYSINE PO, Take by mouth as needed., Disp: , Rfl:    Melatonin 2.5 MG CHEW, Chew 2.5 mg by mouth daily as needed., Disp: , Rfl:    metFORMIN (GLUCOPHAGE-XR) 500 MG 24 hr tablet, TAKE 1 TABLET BY MOUTH  DAILY WITH BREAKFAST, Disp: 90 tablet, Rfl: 3   Multiple Vitamins-Minerals (CENTRUM SILVER 50+WOMEN) TABS, Take 1 tablet by mouth daily., Disp: , Rfl:    OneTouch Delica Lancets 11X MISC, 1 application by Does not apply route 3 (three) times a week. Dx:E11.9 LON:99 check BS 3x per week, Disp: 100 each, Rfl: 5   OVER THE COUNTER MEDICATION, neuriva plus OTC supplement (like prevagen), Disp: , Rfl:    pseudoephedrine (SUDAFED) 30 MG tablet, Take 1 tablet (30 mg total) by mouth every 8 (eight) hours as needed for congestion. No more than 3 tablets/month, Disp: 30 tablet, Rfl: 0   simvastatin (ZOCOR) 40 MG tablet, Take 1 tablet (40 mg total) by mouth at bedtime., Disp: 90 tablet, Rfl: 3  Current Facility-Administered Medications:    clobetasol ointment (TEMOVATE) 0.05 %, , Topical, BID, Felicia Heys, MD  Patient Active Problem List   Diagnosis Date Noted   GERD without esophagitis 03/06/2019   Benign neoplasm of sigmoid colon    Lower extremity edema 01/31/2018   Centrilobular emphysema ([place]) 12/02/2016   Pulmonary nodules/lesions, multiple 12/02/2016   Aortic atherosclerosis ([place]) 07/08/2016   Osteopenia 05/27/2016   Abnormality of nail tissue 11/27/2015   Herpes simplex antibody positive 08/22/2015   Essential hypertension    Type 2 diabetes mellitus, controlled ([place])    Atrophic vaginitis    History of tobacco use    Abnormal EKG 07/13/2013   Hyperlipidemia 07/13/2013    Past Surgical History:  Procedure Laterality Date   colonoscopy     COLONOSCOPY WITH PROPOFOL N/A 02/20/2018   Procedure: COLONOSCOPY WITH PROPOFOL;  Surgeon: Felicia Lame, MD;  Location: [facility];  Service: Endoscopy;  Laterality: N/A;  Diabetic   CYSTOSCOPY WITH BIOPSY N/A 01/17/2017   Procedure: CYSTOSCOPY WITH BIOPSY;  Surgeon: Felicia Espy, MD;  Location: [facility];  Service: Urology;  Laterality: N/A;   POLYPECTOMY  02/20/2018   Procedure: POLYPECTOMY;  Surgeon: Felicia Lame, MD;  Location[facility];  Service: Endoscopy;;   TMJ ARTHROPLASTY  1984    Family History  Problem Relation Age of Onset   Stroke Mother    Hypertension Mother  Hyperlipidemia Mother    Diabetes Mother        pre-diabetic   Hearing loss Mother    Diabetes Brother    Irregular heart beat Brother    COPD Father    Hearing loss Father    Thyroid disease Sister    Stroke Maternal Grandmother    Hyperlipidemia Maternal Grandmother    Hypertension Maternal Grandmother    Cancer Maternal Grandfather        bone   Cancer Sister        cystic fibroid carcinoma   Ulcerative colitis Sister    Heart disease Paternal Uncle    Heart disease Paternal Uncle    Hyperlipidemia Maternal Aunt    Stroke Maternal Aunt    Breast cancer Neg Hx    Hematuria Neg Hx    Prostate cancer Neg Hx    Renal cancer Neg Hx     Social History   Tobacco Use   Smoking status: Former Smoker    Packs/day: 3.00    Years: 45.00    Pack years: 135.00    Types: Cigarettes    Quit date: 10/25/2009    Years since quitting: 11.3   Smokeless tobacco: Never Used   Tobacco comment: Quit before my husband was diagnosed with small cell cancer.  Vaping Use   Vaping Use: Never used  Substance Use Topics   Alcohol use: Not Currently    Alcohol/week: 0.0 standard drinks    Comment: Rarely   Drug use: No     No Known Allergies  Health Maintenance  Topic Date Due   COVID-19 Vaccine (3 - Booster for Pfizer series) 07/10/2020   FOOT EXAM  02/14/2021   HEMOGLOBIN A1C  02/16/2021   PNA vac Low Risk Adult (2 of 2 - PPSV23) 02/17/2022 (Originally 11/26/2020)   INFLUENZA VACCINE  05/25/2021   DEXA SCAN  08/13/2021    MAMMOGRAM  09/29/2021   OPHTHALMOLOGY EXAM  12/08/2021   COLONOSCOPY (Pts 45-65yr Insurance coverage will need to be confirmed)  02/21/2028   TETANUS/TDAP  07/10/2029   Hepatitis C Screening  Completed   HPV VACCINES  Aged Out    Chart Review Today: I personally reviewed active problem list, medication list, allergies, family history, social history, health maintenance, notes from last encounter, lab results, imaging with the patient/caregiver today.   Review of Systems  Constitutional: Negative.   HENT: Negative.    Eyes: Negative.   Respiratory: Negative.    Cardiovascular: Negative.   Gastrointestinal: Negative.   Endocrine: Negative.   Genitourinary: Negative.   Musculoskeletal: Negative.   Skin: Negative.   Allergic/Immunologic: Negative.   Neurological: Negative.   Hematological: Negative.   Psychiatric/Behavioral: Negative.    All other systems reviewed and are negative.   Objective:   Vitals:   02/17/21 0837  BP: 128/72  Pulse: 82  Resp: 16  Temp: 98.1 F (36.7 C)  SpO2: 98%  Weight: 174 lb 14.4 oz (79.3 kg)  Height: 5' 8"  (1.727 m)    Body mass index is 26.59 kg/m.  Physical Exam Vitals and nursing note reviewed.  Constitutional:      General: She is not in acute distress.    Appearance: Normal appearance. She is well-developed. She is not ill-appearing, toxic-appearing or diaphoretic.     Interventions: Face mask in place.  HENT:     Head: Normocephalic and atraumatic.     Right Ear: External ear normal.     Left Ear: External ear normal.  Eyes:  General: Lids are normal. No scleral icterus.       Right eye: No discharge.        Left eye: No discharge.     Conjunctiva/sclera: Conjunctivae normal.  Neck:     Trachea: Phonation normal. No tracheal deviation.  Cardiovascular:     Rate and Rhythm: Normal rate and regular rhythm.     Pulses: Normal pulses.          Radial pulses are 2+ on the right side and 2+ on the left side.        Posterior tibial pulses are 2+ on the right side and 2+ on the left side.     Heart sounds: Normal heart sounds. No murmur heard.   No friction rub. No gallop.  Pulmonary:     Effort: Pulmonary effort is normal. No respiratory distress.     Breath sounds: Normal breath sounds. No stridor. No wheezing, rhonchi or rales.  Chest:     Chest wall: No tenderness.  Abdominal:     General: Bowel sounds are normal. There is no distension.     Palpations: Abdomen is soft.  Musculoskeletal:     Right lower leg: No edema.     Left lower leg: No edema.  Skin:    General: Skin is warm and dry.     Coloration: Skin is not jaundiced or pale.     Findings: No rash.  Neurological:     Mental Status: She is alert.     Motor: No abnormal muscle tone.     Gait: Gait normal.  Psychiatric:        Mood and Affect: Mood normal.        Speech: Speech normal.        Behavior: Behavior normal.        Assessment & Plan:     ICD-10-CM   1. Essential hypertension  G53 COMPLETE METABOLIC PANEL WITH GFR    COMPLETE METABOLIC PANEL WITH GFR   stable, well controlled, BP at gaol, continue lisinopril 5 mg daily    2. Controlled type 2 diabetes mellitus with other circulatory complication, without long-term current use of insulin (HCC)  E11.59 Lipid panel    COMPLETE METABOLIC PANEL WITH GFR    Hemoglobin A1C    COMPLETE METABOLIC PANEL WITH GFR   has been well controlled on metformin    3. Centrilobular emphysema ([place])  J43.2    pt denies any sx or need for inhalers    4. Mixed hyperlipidemia  E78.2 Lipid panel    COMPLETE METABOLIC PANEL WITH GFR    5. GERD without esophagitis  K21.9 DG Abd 1 View    omeprazole (PRILOSEC) 20 MG capsule   abd pain may be due to GERD flare? PPI and resume pepcid BID when improved    6. Encounter for medication monitoring  Z51.81 Lipid panel    COMPLETE METABOLIC PANEL WITH GFR    Hemoglobin A1C    7. Multiple thyroid nodules  E04.2     8. Aortic  atherosclerosis (HCC) Chronic I70.0 Lipid panel    COMPLETE METABOLIC PANEL WITH GFR    9. Osteopenia of neck of left femur  M46.803 COMPLETE METABOLIC PANEL WITH GFR    10. Hypercalcemia  O12.24 COMPLETE METABOLIC PANEL WITH GFR    Hemoglobin A1C    11. Abdominal pain, left upper quadrant  M25.00 COMPLETE METABOLIC PANEL WITH GFR    CBC with Differential/Platelet    COMPLETE METABOLIC PANEL WITH  GFR    DG Abd 1 View    omeprazole (PRILOSEC) 20 MG capsule    CANCELED: Lipase   see instructions below which were given to pt     If you have a repeat episode of your abdominal pain get an appointment and come to the office and complete your labs and go to the outpatient imaging and get an xray done of you stomach.    [name], PA-C 02/17/21 9:00 AM

## 2021-02-17 NOTE — Patient Instructions (Signed)
If you have a repeat episode of your abdominal pain get an appointment and come to the office and complete your labs and go to the outpatient imaging and get an xray done of you stomach.  Drink more water and continue to stay busy and active

## 2021-02-18 LAB — COMPLETE METABOLIC PANEL WITH GFR
AG Ratio: 2.3 (calc) (ref 1.0–2.5)
ALT: 17 U/L (ref 6–29)
AST: 17 U/L (ref 10–35)
Albumin: 4.5 g/dL (ref 3.6–5.1)
Alkaline phosphatase (APISO): 68 U/L (ref 37–153)
BUN/Creatinine Ratio: 35 (calc) — ABNORMAL HIGH (ref 6–22)
BUN: 28 mg/dL — ABNORMAL HIGH (ref 7–25)
CO2: 29 mmol/L (ref 20–32)
Calcium: 9.8 mg/dL (ref 8.6–10.4)
Chloride: 109 mmol/L (ref 98–110)
Creat: 0.79 mg/dL (ref 0.60–0.93)
GFR, Est African American: 88 mL/min/{1.73_m2} (ref >=60)
GFR, Est Non African American: 76 mL/min/{1.73_m2} (ref >=60)
Globulin: 2 g/dL (calc) (ref 1.9–3.7)
Glucose, Bld: 109 mg/dL — ABNORMAL HIGH (ref 65–99)
Potassium: 5.8 mmol/L — ABNORMAL HIGH (ref 3.5–5.3)
Sodium: 144 mmol/L (ref 135–146)
Total Bilirubin: 0.4 mg/dL (ref 0.2–1.2)
Total Protein: 6.5 g/dL (ref 6.1–8.1)

## 2021-02-18 LAB — LIPID PANEL
Cholesterol: 119 mg/dL (ref <200)
HDL: 53 mg/dL (ref >=50)
LDL Cholesterol (Calc): 47 mg/dL (calc)
Non-HDL Cholesterol (Calc): 66 mg/dL (calc) (ref <130)
Total CHOL/HDL Ratio: 2.2 (calc) (ref <5.0)
Triglycerides: 100 mg/dL (ref <150)

## 2021-02-18 LAB — HEMOGLOBIN A1C
Hgb A1c MFr Bld: 5.8 % of total Hgb — ABNORMAL HIGH (ref <5.7)
Mean Plasma Glucose: 120 mg/dL
eAG (mmol/L): 6.6 mmol/L

## 2021-02-26 ENCOUNTER — Encounter: Payer: Self-pay | Admitting: Family Medicine

## 2021-03-05 ENCOUNTER — Telehealth: Payer: Self-pay

## 2021-03-05 NOTE — Progress Notes (Signed)
Chronic Care Management Pharmacy Assistant   Name: Felicia Cisneros  MRN: [id number] DOB: 08-Nov-1950  Reason for Encounter:Hypertension Disease State Call.  Recent office visits:  02/17/2021 [name] PA-C (PCP) started Omeprazole 20 mg prn  Recent consult visits:  No recent consult Visit  Hospital visits:  None in previous 6 months  Medications: Outpatient Encounter Medications as of 03/05/2021  Medication Sig Note  . acetaminophen (TYLENOL) 500 MG tablet Take 1,000 mg by mouth every 6 (six) hours as needed (for pain.).   Marland Kitchen aspirin EC 81 MG tablet Take 81 mg by mouth at bedtime.   Marland Kitchen BIOTIN PO Take by mouth daily.   . Blood Glucose Monitoring Suppl (ONETOUCH VERIO FLEX SYSTEM) w/Device KIT Check blood sugars up to twice daily prn Dx:E11.9 LON:99   . Calcium-Vitamin D-Vitamin K (VIACTIV CALCIUM PLUS D) 650-12.5-40 MG-MCG-MCG CHEW Chew 1 Troche by mouth in the morning and at bedtime.   . clobetasol cream (TEMOVATE) 0.05 % Apply to labia twice daily for 3 weeks then twice weekly as directed   . Coenzyme Q10 (COQ10) 100 MG CAPS One by mouth daily   . famotidine (PEPCID) 20 MG tablet TAKE 1 TABLET BY MOUTH  TWICE DAILY   . glucose blood (ONETOUCH VERIO) test strip CHECK FINGERSTICK BLOOD SUGARS THREE TIMES A WEEK   . lisinopril (ZESTRIL) 5 MG tablet TAKE 1 TABLET BY MOUTH  DAILY FOR BLOOD PRESSURE   . LYSINE PO Take by mouth as needed.   . Melatonin 2.5 MG CHEW Chew 2.5 mg by mouth daily as needed. 10/28/2020: prn  . metFORMIN (GLUCOPHAGE-XR) 500 MG 24 hr tablet TAKE 1 TABLET BY MOUTH  DAILY WITH BREAKFAST   . Multiple Vitamins-Minerals (CENTRUM SILVER 50+WOMEN) TABS Take 1 tablet by mouth daily.   Marland Kitchen omeprazole (PRILOSEC) 20 MG capsule Take 1 capsule (20 mg total) by mouth daily as needed for up to 14 days.   Glory Rosebush Delica Lancets 34K MISC 1 application by Does not apply route 3 (three) times a week. Dx:E11.9 LON:99 check BS 3x per week   . OVER THE COUNTER MEDICATION neuriva  plus OTC supplement (like prevagen)   . pseudoephedrine (SUDAFED) 30 MG tablet Take 1 tablet (30 mg total) by mouth every 8 (eight) hours as needed for congestion. No more than 3 tablets/month   . simvastatin (ZOCOR) 40 MG tablet Take 1 tablet (40 mg total) by mouth at bedtime.    Facility-Administered Encounter Medications as of 03/05/2021  Medication  . clobetasol ointment (TEMOVATE) 0.05 %    Star Rating Drugs: Lisinopril 5 mg last filled on 02/19/2021 for 90 day supply at [facility]. Metformin 2.5 mg last filled on 11/29/2020 for 90 day supply at [facility]. Simvastatin 40 mg last filled on 02/14/2021 for 90 day supply at [place] chart prior to disease state call. Spoke with patient regarding BP  Recent Office Vitals: BP Readings from Last 3 Encounters:  02/17/21 128/72  10/28/20 134/82  09/30/20 122/78   Pulse Readings from Last 3 Encounters:  02/17/21 82  10/28/20 93  09/30/20 97    Wt Readings from Last 3 Encounters:  02/17/21 174 lb 14.4 oz (79.3 kg)  09/30/20 180 lb 11.2 oz (82 kg)  09/16/20 177 lb (80.3 kg)     Kidney Function Lab Results  Component Value Date/Time   CREATININE 0.79 02/17/2021 08:58 AM   CREATININE 0.81 08/25/2020 09:15 AM   GFRNONAA 76 02/17/2021 08:58 AM   GFRAA  88 02/17/2021 08:58 AM    BMP Latest Ref Rng & Units 02/17/2021 08/25/2020 08/18/2020  Glucose 65 - 99 mg/dL 109(H) 93 94  BUN 7 - 25 mg/dL 28(H) 18 20  Creatinine 0.60 - 0.93 mg/dL 0.79 0.81 0.78  BUN/Creat Ratio 6 - 22 (calc) 14(A) NOT APPLICABLE NOT APPLICABLE  Sodium 307 - 146 mmol/L 144 141 142  Potassium 3.5 - 5.3 mmol/L 5.8(H) 5.0 6.0(H)  Chloride 98 - 110 mmol/L 109 108 105  CO2 20 - 32 mmol/L _0 Calcium 8.6 - 10.4 mg/dL 9.8 9.6 10.5(H)    . Current antihypertensive regimen:  ? Lisinopril 5 mg daily . How often are you checking your Blood Pressure? Patient states she is not checking her blood pressure at this time. . Current home BP  readings: None ID . What recent interventions/DTPs have been made by any provider to improve Blood Pressure control since last CPP Visit: None ID . Any recent hospitalizations or ED visits since last visit with CPP? No . What diet changes have been made to improve Blood Pressure Control?  o No Change indicated . What exercise is being done to improve your Blood Pressure Control?  o None ID   Adherence Review: Is the patient currently on ACE/ARB medication? Yes Does the patient have >5 day gap between last estimated fill dates? No   Anderson [place] Clinical Production designer, theatre/television/film [phone]

## 2021-03-16 ENCOUNTER — Encounter: Payer: Self-pay | Admitting: Family Medicine

## 2021-03-22 ENCOUNTER — Encounter: Payer: Self-pay | Admitting: Family Medicine

## 2021-04-09 MED ORDER — FAMOTIDINE 20 MG PO TABS: 20.0000 mg | ORAL_TABLET | Freq: Two times a day (BID) | ORAL | 3 refills | Status: DC

## 2021-05-15 ENCOUNTER — Encounter: Payer: Self-pay | Admitting: Family Medicine

## 2021-05-18 MED ORDER — ONETOUCH DELICA LANCETS 30G MISC: 1.0000 "application " | 5 refills | Status: DC

## 2021-05-18 NOTE — Addendum Note (Signed)
Addended by: Docia Furl on: 05/18/2021 01:41 PM   Modules accepted: Orders

## 2021-05-21 ENCOUNTER — Telehealth: Payer: Self-pay

## 2021-05-27 ENCOUNTER — Telehealth: Payer: Self-pay

## 2021-05-27 NOTE — Progress Notes (Signed)
    Chronic Care Management Pharmacy Assistant   Name: Felicia Cisneros  MRN: [id number] DOB: [birth year]  Patient called to see if we can reschedule her appointment with [name], CPP for a later date. Spoke to the patient and tried to reschedule her for September, but she requested to be seen next week. I was able to schedule the patient for 08/11 '@0800'$ . Patient requested that I send her the information via email in which I stated I would send it to the email she provided [email or web address]  I asked the patient if there was anything I could assist her with at this time, but she stated no. CPP has been informed of the date.  Star Rating Drug: Metformin 500 mg last filled on 03/25/2021 for a 90-Day supply with [facility] Lisinopril 5 mg last filled on 05/15/2021 for a 90-Day supply with [facility] Simvastatin 40 mg last filled on 05/10/2021 for a 90-Day supply with [facility]  Any gaps in medications fill history? No  Care Gaps: INFLUENZA VACCINE  05/27/2021 LVM requesting patient call me back in order to reschedule her appointment  Lynann Bologna, [place] Pharmacist Assistant Phone: [phone]

## 2021-05-28 ENCOUNTER — Telehealth: Payer: Medicare Other

## 2021-06-03 ENCOUNTER — Telehealth: Payer: Self-pay

## 2021-06-03 NOTE — Progress Notes (Signed)
    Chronic Care Management Pharmacy Assistant   Name: [name]  MRN: [id number] DOB: 10/26/1950  Patient called to be reminded of her appointment with [name], CPP on 06/04/2021 @ 0800 via telephone.  Patient aware of appointment date, time, and type of appointment (either telephone or in person). Patient aware to have/bring all medications, supplements, blood pressure and/or blood sugar logs to visit.  Questions:  Are there any concerns you would like to discuss during your office visit?  Nothing that she can think  Are you having any problems obtaining your medications? No  I Star Rating Drug: Metformin 500 mg last filled on 03/25/2021 for a 90-Day supply with [facility] Lisinopril 5 mg last filled on 05/15/2021 for a 90-Day supply with [facility] Simvastatin 40 mg last filled on 05/10/2021 for a 90-Day supply with [facility]  Any gaps in medications fill history? No  CARE GAPS: INFLUENZA VACCINE  Lynann Bologna, CPA/CMA Clinical Pharmacist Assistant Phone: [phone]

## 2021-06-04 ENCOUNTER — Ambulatory Visit (INDEPENDENT_AMBULATORY_CARE_PROVIDER_SITE_OTHER): Payer: Medicare Other

## 2021-06-04 DIAGNOSIS — E1169 Type 2 diabetes mellitus with other specified complication: Secondary | ICD-10-CM

## 2021-06-04 DIAGNOSIS — E1159 Type 2 diabetes mellitus with other circulatory complications: Secondary | ICD-10-CM | POA: Diagnosis not present

## 2021-06-04 DIAGNOSIS — E785 Hyperlipidemia, unspecified: Secondary | ICD-10-CM | POA: Diagnosis not present

## 2021-06-04 DIAGNOSIS — I152 Hypertension secondary to endocrine disorders: Secondary | ICD-10-CM | POA: Diagnosis not present

## 2021-06-04 DIAGNOSIS — M8589 Other specified disorders of bone density and structure, multiple sites: Secondary | ICD-10-CM

## 2021-06-04 NOTE — Patient Instructions (Signed)
Visit Information It was great speaking with you today!  Please let me know if you have any questions about our visit.   Goals Addressed             This Visit's Progress    Monitor and Manage My Blood Sugar-Diabetes Type 2       Timeframe:  Long-Range Goal Priority:  High Start Date:  06/04/2021                            Expected End Date:   06/04/2022                    Follow Up Date 09/23/2021    - check blood sugar at prescribed times - check blood sugar if I feel it is too high or too low - take the blood sugar log to all doctor visits    Why is this important?   Checking your blood sugar at home helps to keep it from getting very high or very low.  Writing the results in a diary or log helps the doctor know how to care for you.  Your blood sugar log should have the time, date and the results.  Also, write down the amount of insulin or other medicine that you take.  Other information, like what you ate, exercise done and how you were feeling, will also be helpful.     Notes:         Patient Care Plan: General Pharmacy (Adult)     Problem Identified: Hypertension, Hyperlipidemia, Diabetes, Coronary Artery Disease, GERD, and Osteopenia   Priority: High     Long-Range Goal: Patient-Specific Goal   Start Date: 06/04/2021  Expected End Date: 06/04/2022  This Visit's Progress: On track  Priority: High  Note:   Current Barriers:  No barriers noted  Pharmacist Clinical Goal(s):  Patient will maintain control of blood pressure as evidenced by BP less than 140/90  maintain control of diabetes as evidenced by A1c less than 7% through collaboration with PharmD and provider.   Interventions: 1:1 collaboration with [name], PA-C regarding development and update of comprehensive plan of care as evidenced by provider attestation and co-signature Inter-disciplinary care team collaboration (see longitudinal plan of care) Comprehensive medication review performed;  medication list updated in electronic medical record  Hypertension (BP goal <140/90) -Controlled -Current treatment: Lisinopril 5 mg daily -Medications previously tried: NA  -Current home readings: NA -Current dietary habits: Tries to limit sodium intake. Does indulge in sweets and grapes. Enjoys milk or flavored drinks (lemonade with splenda).  -Current exercise habits: Previously going to fitness center twice weekly, paused for summer  -Denies hypotensive/hypertensive symptoms -Educated on Symptoms of hypotension and importance of maintaining adequate hydration; -Counseled to monitor BP at home weekly, document, and provide log at future appointments -Recommended to continue current medication  Hyperlipidemia: (LDL goal < 70) -Controlled -Current treatment: Simvastatin 40 mg nightly -Medications previously tried: NA  -Recommended to continue current medication  Diabetes (A1c goal <7%) -Controlled -Current medications: Metformin XR 500 mg daily  -Medications previously tried: NA  -Current home glucose readings fasting glucose: 110, 91. Typical range 89-115.   -Recommended to continue current medication  Osteopenia (Goal Prevent bone fractures) -Controlled -Patient is not a candidate for pharmacologic treatment -Current treatment  Calcium + Vitamin D3 (takes 1 troche a few times weekly) -Medications previously tried: NA  -Patient gets 1-2 servings of dietary calcium most days. -  Recommend 1200 mg of calcium daily from dietary and supplemental sources. -Patient due for repeat DEXA in Oct 2022 -Recommended to continue current medication  Patient Goals/Self-Care Activities Patient will:  - check glucose daily before breakfast, document, and provide at future appointments check blood pressure weekly, document, and provide at future appointments  Follow Up Plan: Telephone follow up appointment with care management team member scheduled for:  05/02/2021       Patient agreed  to services and verbal consent obtained.   Patient verbalizes understanding of instructions provided today and agrees to view in [place].   Malva Limes, [facility] [phone]

## 2021-06-04 NOTE — Progress Notes (Signed)
Chronic Care Management Pharmacy Note  06/04/2021 Name:  Felicia Cisneros MRN:  [id number] DOB:  1951-09-02  Summary: Patient presents for CCM follow-up. She is having several concerns including ankle swelling, leg cramping, and painful, itchy urination. She is wearing compression stockings and making sure to elevate her legs at rest. Her leg cramping is worse at night. She is trying to ensure she is drinking enough water throughout the day.   Her blood sugars and blood pressure remain in excellent control.   Recommendations/Changes made from today's visit: Patient will plan to use vaginal cream prescribed by obgyn, otherwise advised to set up actue visit if symptoms do not improve  Patient due for repeat DEXA in October  Plan: CPP follow-up in 6 months   Subjective: Felicia Cisneros is an 70 y.o. year old female who is a primary patient of [name], [place].  The CCM team was consulted for assistance with disease management and care coordination needs.    Engaged with patient by telephone for follow up visit in response to provider referral for pharmacy case management and/or care coordination services.   Consent to Services:  The patient was given information about Chronic Care Management services, agreed to services, and gave verbal consent prior to initiation of services.  Please see initial visit note for detailed documentation.   Patient Care Team: [name], PA-C as PCP - General (Family Medicine) Minna Merritts, MD as PCP - Cardiology (Cardiology) Oneta Rack, MD as Consulting Physician (Dermatology) Minna Merritts, MD as Consulting Physician (Cardiology) Lucilla Lame, MD as Consulting Physician (Gastroenterology) Germaine Pomfret, [facility] as Pharmacist (Pharmacist)  Recent office visits: 02/17/21: Patient presented to [name], PA-C for follow-up. A1c 5.8%.   Recent consult visits: None in previous 6 months  Hospital visits: None in previous 6  months   Objective:  Lab Results  Component Value Date   CREATININE 0.79 02/17/2021   BUN 28 (H) 02/17/2021   GFRNONAA 76 02/17/2021   GFRAA 88 02/17/2021   NA 144 02/17/2021   K 5.8 (H) 02/17/2021   CALCIUM 9.8 02/17/2021   CO2 29 02/17/2021   GLUCOSE 109 (H) 02/17/2021    Lab Results  Component Value Date/Time   HGBA1C 5.8 (H) 02/17/2021 08:58 AM   HGBA1C 5.9 (H) 08/18/2020 09:49 AM   MICROALBUR 0.5 08/25/2020 09:20 AM   MICROALBUR 0.3 09/06/2018 02:11 PM    Last diabetic Eye exam:  Lab Results  Component Value Date/Time   HMDIABEYEEXA No Retinopathy 12/08/2020 12:00 AM    Last diabetic Foot exam: No results found for: HMDIABFOOTEX   Lab Results  Component Value Date   CHOL 119 02/17/2021   HDL 53 02/17/2021   LDLCALC 47 02/17/2021   TRIG 100 02/17/2021   CHOLHDL 2.2 02/17/2021    Hepatic Function Latest Ref Rng & Units 02/17/2021 08/18/2020 02/15/2020  Total Protein 6.1 - 8.1 g/dL 6.5 6.8 6.4  Albumin 3.5 - 5.0 g/dL - - -  AST 10 - 35 U/L _0 ALT 6 - 29 U/L _1 Alk Phosphatase 38 - 126 U/L - - -  Total Bilirubin 0.2 - 1.2 mg/dL 0.4 0.4 0.5    Lab Results  Component Value Date/Time   TSH 2.99 10/03/2019 12:00 AM    CBC Latest Ref Rng & Units 05/14/2020 02/15/2020 06/05/2019  WBC 4.0 - 10.5 K/uL 6.8 6.7 7.1  Hemoglobin 12.0 - 15.0 g/dL 12.1 14.0 14.7  Hematocrit 36.0 -  46.0 % 36.5 42.0 43.7  Platelets 150 - 400 K/uL 251 229 226    Lab Results  Component Value Date/Time   VD25OH 52 05/31/2016 08:46 AM    Clinical ASCVD: No  The ASCVD Risk score Mikey Bussing DC Jr., et al., 2013) failed to calculate for the following reasons:   The valid total cholesterol range is 130 to 320 mg/dL    Depression screen [facility] 2/9 02/17/2021 11/04/2020 10/28/2020  Decreased Interest 1 0 0  Down, Depressed, Hopeless 1 0 0  PHQ - 2 Score 2 0 0  Altered sleeping 0 - -  Tired, decreased energy 1 - -  Change in appetite 0 - -  Feeling bad or failure about yourself  0 - -   Trouble concentrating 0 - -  Moving slowly or fidgety/restless 0 - -  Suicidal thoughts 0 - -  PHQ-9 Score 3 - -  Difficult doing work/chores Not difficult at all - -  Some recent data might be hidden    -Last DEXA Scan: 08/14/19   T-Score femoral neck: -1.7  T-Score total hip: -1.7  T-Score lumbar spine: -1.7  T-Score forearm radius: NA  10-year probability of major osteoporotic fracture: 10%  10-year probability of hip fracture: 1.4%  Social History   Tobacco Use  Smoking Status Former   Packs/day: 3.00   Years: 45.00   Pack years: 135.00   Types: Cigarettes   Quit date: 10/25/2009   Years since quitting: 11.6  Smokeless Tobacco Never  Tobacco Comments   Quit before my husband was diagnosed with small cell cancer.   BP Readings from Last 3 Encounters:  02/17/21 128/72  10/28/20 134/82  09/30/20 122/78   Pulse Readings from Last 3 Encounters:  02/17/21 82  10/28/20 93  09/30/20 97   Wt Readings from Last 3 Encounters:  02/17/21 174 lb 14.4 oz (79.3 kg)  09/30/20 180 lb 11.2 oz (82 kg)  09/16/20 177 lb (80.3 kg)   BMI Readings from Last 3 Encounters:  02/17/21 26.59 kg/m  09/30/20 27.48 kg/m  09/16/20 26.91 kg/m    Assessment/Interventions: Review of patient past medical history, allergies, medications, health status, including review of consultants reports, laboratory and other test data, was performed as part of comprehensive evaluation and provision of chronic care management services.   SDOH:  (Social Determinants of Health) assessments and interventions performed: Yes SDOH Interventions    Flowsheet Row Most Recent Value  SDOH Interventions   Financial Strain Interventions Intervention Not Indicated      SDOH Screenings   Alcohol Screen: Low Risk    Last Alcohol Screening Score (AUDIT): 0  Depression (PHQ2-9): Low Risk    PHQ-2 Score: 3  Financial Resource Strain: Low Risk    Difficulty of Paying Living Expenses: Not hard at all  Food  Insecurity: No Food Insecurity   Worried About Charity fundraiser in the Last Year: Never true   Ran Out of Food in the Last Year: Never true  Housing: Low Risk    Last Housing Risk Score: 0  Physical Activity: Insufficiently Active   Days of Exercise per Week: 2 days   Minutes of Exercise per Session: 60 min  Social Connections: Socially Isolated   Frequency of Communication with Friends and Family: More than three times a week   Frequency of Social Gatherings with Friends and Family: Three times a week   Attends Religious Services: Never   Active Member of Clubs or Organizations: No   Attends  Club or Organization Meetings: Never   Marital Status: Widowed  Stress: No Stress Concern Present   Feeling of Stress : Not at all  Tobacco Use: Medium Risk   Smoking Tobacco Use: Former   Smokeless Tobacco Use: Never  Transportation Needs: No Data processing manager (Medical): No   Lack of Transportation (Non-Medical): No    CCM Care Plan  No Known Allergies  Medications Reviewed Today     Reviewed by Cathrine Muster, CMA (Certified Medical Assistant) on 02/17/21 at [place] List Status: <None>   Medication Order Taking? Sig Documenting Provider Last Dose Status Informant  acetaminophen (TYLENOL) 500 MG tablet [id number] Yes Take 1,000 mg by mouth every 6 (six) hours as needed (for pain.). [provider] Taking Active Self  aspirin EC 81 MG tablet [id number] Yes Take 81 mg by mouth at bedtime. [provider] Taking Active Self  BIOTIN PO [id number] Yes Take by mouth daily. [provider] Taking Active   Blood Glucose Monitoring Suppl (ONETOUCH VERIO FLEX SYSTEM) w/Device KIT [id number] Yes Check blood sugars up to twice daily prn Dx:E11.9 LON:99 [name], PA-C Taking Active   Calcium-Vitamin D-Vitamin K (VIACTIV CALCIUM PLUS D) 650-12.5-40 MG-MCG-MCG CHEW [id number] Yes Chew 1 Troche by mouth in the morning and at bedtime. [provider] Taking Active Self    Discontinued 02/17/21 [phone] (No longer needed (for PRN medications)) clobetasol cream (TEMOVATE) 0.05 % [id number] Yes Apply to labia twice daily for 3 weeks then twice weekly as directed Harlin Heys, MD Taking Active   clobetasol ointment (TEMOVATE) 0.05 % [id number]   Harlin Heys, MD  Active   Coenzyme Q10 (COQ10) 100 MG CAPS [id number] Yes One by mouth daily Arnetha Courser, MD Taking Active   famotidine (PEPCID) 20 MG tablet [id number] Yes TAKE 1 TABLET BY MOUTH  TWICE DAILY [name], PA-C Taking Active     Discontinued 02/17/21 0839 (No longer needed (for PRN medications))   glucose blood (ONETOUCH VERIO) test strip [id number] Yes CHECK FINGERSTICK BLOOD SUGARS THREE TIMES A WEEK [name], PA-C Taking Active   lisinopril (ZESTRIL) 5 MG tablet [id number] Yes TAKE 1 TABLET BY MOUTH  DAILY FOR BLOOD PRESSURE [name], PA-C Taking Active     Discontinued 02/17/21 0840   LYSINE PO [id number] Yes Take by mouth as needed. [provider] Taking Active   Melatonin 2.5 MG CHEW [id number] Yes Chew 2.5 mg by mouth daily as needed. [provider] Taking Active            Med Note (JEFFRIES, Glenice Laine Oct 28, 2020  8:32 AM) prn  metFORMIN (GLUCOPHAGE-XR) 500 MG 24 hr tablet [id number] Yes TAKE 1 TABLET BY MOUTH  DAILY WITH BREAKFAST [name], PA-C Taking Active   Multiple Vitamins-Minerals (CENTRUM SILVER 50+WOMEN) TABS [id number] Yes Take 1 tablet by mouth daily. [provider] Taking Active   OneTouch Delica Lancets 62M MISC [id number] Yes 1 application by Does not apply route 3 (three) times a week. Dx:E11.9 LON:99 check BS 3x per week [name], PA-C Taking Active   OVER THE COUNTER MEDICATION [id number] Yes neuriva plus OTC supplement (like prevagen) [provider] Taking Active     Discontinued 02/17/21 0840 (No longer needed (for PRN medications))   pseudoephedrine (SUDAFED) 30 MG tablet [id number] Yes  Take 1 tablet (30 mg total) by mouth every 8 (eight) hours as needed for congestion. No more than 3 tablets/month Uvaldo Rising,  Emily E, FNP Taking Active   simvastatin (ZOCOR) 40 MG tablet [id number] Yes Take 1 tablet (40 mg total) by mouth at bedtime. Tapia, Leisa, PA-C Taking Active             Patient Active Problem List   Diagnosis Date Noted   GERD without esophagitis 03/06/2019   Benign neoplasm of sigmoid colon    Lower extremity edema 01/31/2018   Centrilobular emphysema (HCC) 12/02/2016   Pulmonary nodules/lesions, multiple 12/02/2016   Aortic atherosclerosis (HCC) 07/08/2016   Osteopenia 05/27/2016   Abnormality of nail tissue 11/27/2015   Herpes simplex antibody positive 08/22/2015   Essential hypertension    Type 2 diabetes mellitus, controlled (HCC)    Atrophic vaginitis    History of tobacco use    Abnormal EKG 07/13/2013   Hyperlipidemia 07/13/2013    Immunization History  Administered Date(s) Administered   Fluad Quad(high Dose 65+) 07/08/2020   Influenza, High Dose Seasonal PF 06/12/2017, 06/29/2018, 06/13/2019   Influenza,inj,Quad PF,6+ Mos 08/22/2015   Influenza-Unspecified 06/15/2017   PFIZER Comirnaty(Gray Top)Covid-19 Tri-Sucrose Vaccine 03/18/2021   PFIZER(Purple Top)SARS-COV-2 Vaccination 12/16/2019, 01/08/2020   PNEUMOCOCCAL CONJUGATE-20 02/18/2021   Pneumococcal Conjugate-13 06/09/2017   Pneumococcal Polysaccharide-23 11/27/2015   Td 07/11/2019   Tdap 10/25/2008, 07/11/2019   Zoster Recombinat (Shingrix) 07/11/2019, 12/05/2019   Zoster, Live 02/27/2016    Conditions to be addressed/monitored:  Hypertension, Hyperlipidemia, Diabetes, Coronary Artery Disease, GERD, and Osteopenia  Care Plan : General Pharmacy (Adult)  Updates made by Virgil Slinger A, RPH since 06/04/2021 12:00 AM     Problem: Hypertension, Hyperlipidemia, Diabetes, Coronary Artery Disease, GERD, and Osteopenia   Priority: High     Long-Range Goal: Patient-Specific Goal    Start Date: 06/04/2021  Expected End Date: 06/04/2022  This Visit's Progress: On track  Priority: High  Note:   Current Barriers:  No barriers noted  Pharmacist Clinical Goal(s):  Patient will maintain control of blood pressure as evidenced by BP less than 140/90  maintain control of diabetes as evidenced by A1c less than 7% through collaboration with PharmD and provider.   Interventions: 1:1 collaboration with Tapia, Leisa, PA-C regarding development and update of comprehensive plan of care as evidenced by provider attestation and co-signature Inter-disciplinary care team collaboration (see longitudinal plan of care) Comprehensive medication review performed; medication list updated in electronic medical record  Hypertension (BP goal <140/90) -Controlled -Current treatment: Lisinopril 5 mg daily -Medications previously tried: NA  -Current home readings: NA -Current dietary habits: Tries to limit sodium intake. Does indulge in sweets and grapes. Enjoys milk or flavored drinks (lemonade with splenda).  -Current exercise habits: Previously going to fitness center twice weekly, paused for summer  -Denies hypotensive/hypertensive symptoms -Educated on Symptoms of hypotension and importance of maintaining adequate hydration; -Counseled to monitor BP at home weekly, document, and provide log at future appointments -Recommended to continue current medication  Hyperlipidemia: (LDL goal < 70) -Controlled -Current treatment: Simvastatin 40 mg nightly -Medications previously tried: NA  -Recommended to continue current medication  Diabetes (A1c goal <7%) -Controlled -Current medications: Metformin XR 500 mg daily  -Medications previously tried: NA  -Current home glucose readings fasting glucose: 110, 91. Typical range 89-115.   -Recommended to continue current medication  Osteopenia (Goal Prevent bone fractures) -Controlled -Patient is not a candidate for pharmacologic  treatment -Current treatment  Calcium + Vitamin D3 (takes 1 troche a few times weekly) -Medications previously tried: NA  -Patient gets 1-2 servings of dietary calcium most days. -Recommend 1200 mg   of calcium daily from dietary and supplemental sources. -Patient due for repeat DEXA in Oct 2022 -Recommended to continue current medication  Patient Goals/Self-Care Activities Patient will:  - check glucose daily before breakfast, document, and provide at future appointments check blood pressure weekly, document, and provide at future appointments  Follow Up Plan: Telephone follow up appointment with care management team member scheduled for:  05/02/2021       Medication Assistance: None required.  Patient affirms current coverage meets needs.  Compliance/Adherence/Medication fill history: Care Gaps: Influenza vaccine  Star-Rating Drugs: Metformin 500 mg last filled on 03/25/2021 for a 90-Day supply with [facility] Lisinopril 5 mg last filled on 05/15/2021 for a 90-Day supply with [facility] Simvastatin 40 mg last filled on 05/10/2021 for a 90-Day supply with [facility]  Patient's preferred pharmacy is:  [id number] [place] [address]Phone: [phone]: [phone] [facility]  [address] [address] [address]Phone: [phone]: [phone] Uses pill box? Yes Pt endorses 100% compliance  We discussed: Current pharmacy is preferred with insurance plan and patient is satisfied with pharmacy services Patient decided to: Continue current medication management strategy  Care Plan and Follow Up Patient Decision:  Patient agrees to Care Plan and Follow-up.  Plan: Telephone follow up appointment with care management team member scheduled for:  05/02/2021   AMalva Limes [phone]

## 2021-06-17 ENCOUNTER — Ambulatory Visit: Payer: Self-pay | Admitting: *Deleted

## 2021-06-17 NOTE — Telephone Encounter (Signed)
Reason for Disposition  [1] MILD weakness (i.e., does not interfere with ability to work, go to school, normal activities) AND [2] persists > 1 week  Answer Assessment - Initial Assessment Questions 1. DESCRIPTION: "Describe how you are feeling."     Not feeling well 2. SEVERITY: "How bad is it?"  "Can you stand and walk?"   - MILD - Feels weak or tired, but does not interfere with work, school or normal activities   - [place] to stand and walk; weakness interferes with work, school, or normal activities   - SEVERE - Unable to stand or walk     mild 3. ONSET:  "When did the weakness begin?"     Monday evening 4. CAUSE: "What do you think is causing the weakness?"     Not sure 5. MEDICINES: "Have you recently started a new medicine or had a change in the amount of a medicine?"     no 6. OTHER SYMPTOMS: "Do you have any other symptoms?" (e.g., chest pain, fever, cough, SOB, vomiting, diarrhea, bleeding, other areas of pain)     no 7. PREGNANCY: "Is there any chance you are pregnant?" "When was your last menstrual period?"     N/a  Protocols used: Weakness (Generalized) and Fatigue-A-AH

## 2021-06-17 NOTE — Telephone Encounter (Signed)
Patient is calling to report she is having fatigue and lack of appetite since Monday. Patient states overall just doesn't feel good. Patient denies chest pain, edema, SOB and other symptoms. Patient states her glucose levels have been up slightly- but range is 120 or below. Patient states she needs new BP cuff for accurate reading. She is going to do home COVID test to rule that out as reason for symptoms.

## 2021-06-18 ENCOUNTER — Encounter: Payer: Medicare Other | Admitting: Family Medicine

## 2021-06-18 NOTE — Progress Notes (Signed)
Not seen

## 2021-06-25 ENCOUNTER — Encounter: Payer: Self-pay | Admitting: Family Medicine

## 2021-07-01 ENCOUNTER — Encounter: Payer: Self-pay | Admitting: Family Medicine

## 2021-07-01 ENCOUNTER — Other Ambulatory Visit: Payer: Self-pay

## 2021-07-01 DIAGNOSIS — E119 Type 2 diabetes mellitus without complications: Secondary | ICD-10-CM

## 2021-07-01 MED ORDER — ONETOUCH VERIO FLEX SYSTEM W/DEVICE KIT: PACK | 0 refills | Status: DC

## 2021-07-01 MED ORDER — ONETOUCH DELICA LANCETS 30G MISC: 1.0000 "application " | 5 refills | Status: DC

## 2021-07-08 ENCOUNTER — Encounter: Payer: Self-pay | Admitting: Family Medicine

## 2021-07-09 ENCOUNTER — Ambulatory Visit: Payer: Medicare Other

## 2021-07-14 ENCOUNTER — Other Ambulatory Visit: Payer: Self-pay | Admitting: Family Medicine

## 2021-07-14 DIAGNOSIS — E119 Type 2 diabetes mellitus without complications: Secondary | ICD-10-CM

## 2021-08-04 ENCOUNTER — Telehealth: Payer: Self-pay | Admitting: Family Medicine

## 2021-08-04 NOTE — Telephone Encounter (Signed)
Copied from [place] [id number]. Topic: Medicare AWV >> Aug 04, 2021  2:20 PM Cher Nakai R wrote: Reason for CRM:  LM with new appt information - AWV rescheduled due to [facility] out of the office

## 2021-08-20 ENCOUNTER — Ambulatory Visit: Payer: Medicare Other | Admitting: Family Medicine

## 2021-08-20 ENCOUNTER — Ambulatory Visit: Payer: Medicare Other

## 2021-08-21 ENCOUNTER — Encounter: Payer: Self-pay | Admitting: Nurse Practitioner

## 2021-08-21 ENCOUNTER — Telehealth (INDEPENDENT_AMBULATORY_CARE_PROVIDER_SITE_OTHER): Payer: Medicare Other | Admitting: Nurse Practitioner

## 2021-08-21 ENCOUNTER — Other Ambulatory Visit: Payer: Self-pay

## 2021-08-21 DIAGNOSIS — J432 Centrilobular emphysema: Secondary | ICD-10-CM

## 2021-08-21 DIAGNOSIS — U071 COVID-19: Secondary | ICD-10-CM

## 2021-08-21 NOTE — Progress Notes (Signed)
Name: Felicia Cisneros   MRN: [id number]    DOB: Aug 04, 1951   Date:08/21/2021       Progress Note  Subjective  Chief Complaint  Chief Complaint  Patient presents with   Covid Positive    Cough, congested, headache, went on cruise to [place]    I connected with  [name]  on 08/21/21 at  1:00 PM EDT by a video enabled telemedicine application and verified that I am speaking with the correct person using two identifiers.  I discussed the limitations of evaluation and management by telemedicine and the availability of in person appointments. The patient expressed understanding and agreed to proceed with a virtual visit  Staff also discussed with the patient that there may be a patient responsible charge related to this service. Patient Location: home Provider Location: [facility] Additional Individuals present: alone  HPI  Covid-19: She says she was on a cruise to [place] and tested positive for Covid on 10/20.  She said her only symptom was a headache. She said yesterday she did another test and it was positive.  Today she started having a cough, and nasal congestion.  She denies any fever, chills, body aches, shortness of breath or chest pain.  She says that she has not taken any medication for her symptoms. Discussed OTC treatments she can do for her symptoms.  Discussed quarantine recommendations from the [facility].  Discussed reasons to seek emergency care.   Emphysema: She says that she has been told that she has emphysema but she does not have any symptoms.  She denies shortness of breath or wheezing.  She does not currently take any medications for emphysema.   Patient Active Problem List   Diagnosis Date Noted   GERD without esophagitis 03/06/2019   Benign neoplasm of sigmoid colon    Lower extremity edema 01/31/2018   Centrilobular emphysema ([place]) 12/02/2016   Pulmonary nodules/lesions, multiple 12/02/2016   Aortic atherosclerosis ([place]) 07/08/2016   Osteopenia 05/27/2016    Abnormality of nail tissue 11/27/2015   Herpes simplex antibody positive 08/22/2015   Essential hypertension    Type 2 diabetes mellitus, controlled (HCC)    Atrophic vaginitis    History of tobacco use    Abnormal EKG 07/13/2013   Hyperlipidemia 07/13/2013    Social History   Tobacco Use   Smoking status: Former    Packs/day: 3.00    Years: 45.00    Pack years: 135.00    Types: Cigarettes    Quit date: 10/25/2009    Years since quitting: 11.8   Smokeless tobacco: Never   Tobacco comments:    Quit before my husband was diagnosed with small cell cancer.  Substance Use Topics   Alcohol use: Not Currently    Alcohol/week: 0.0 standard drinks    Comment: Rarely     Current Outpatient Medications:    acetaminophen (TYLENOL) 500 MG tablet, Take 1,000 mg by mouth every 6 (six) hours as needed (for pain.)., Disp: , Rfl:    aspirin EC 81 MG tablet, Take 81 mg by mouth at bedtime., Disp: , Rfl:    BIOTIN PO, Take by mouth daily., Disp: , Rfl:    Blood Glucose Monitoring Suppl (ONETOUCH VERIO FLEX SYSTEM) w/Device KIT, Check blood sugars up to twice daily prn Dx:E11.9 LON:99, Disp: 1 kit, Rfl: 0   Calcium-Vitamin D-Vitamin K (VIACTIV CALCIUM PLUS D) 650-12.5-40 MG-MCG-MCG CHEW, Chew 1 Troche by mouth in the morning and at bedtime., Disp: , Rfl:  clobetasol cream (TEMOVATE) 0.05 %, Apply to labia twice daily for 3 weeks then twice weekly as directed, Disp: 60 g, Rfl: 2   Cobalamin Combinations (NEURIVA PLUS PO), Take 1 tablet by mouth daily., Disp: , Rfl:    Coenzyme Q10 (COQ10) 100 MG CAPS, One by mouth daily, Disp: , Rfl:    famotidine (PEPCID) 20 MG tablet, Take 1 tablet (20 mg total) by mouth 2 (two) times daily., Disp: 180 tablet, Rfl: 3   glucose blood (ONETOUCH VERIO) test strip, CHECK FINGERSTICK BLOOD  SUGARS 3 TIMES WEEKLY, Disp: 100 strip, Rfl: 3   lisinopril (ZESTRIL) 5 MG tablet, TAKE 1 TABLET BY MOUTH  DAILY FOR BLOOD PRESSURE, Disp: 90 tablet, Rfl: 3   LYSINE PO, Take by  mouth as needed., Disp: , Rfl:    Melatonin 2.5 MG CHEW, Chew 2.5 mg by mouth daily as needed., Disp: , Rfl:    metFORMIN (GLUCOPHAGE-XR) 500 MG 24 hr tablet, TAKE 1 TABLET BY MOUTH  DAILY WITH BREAKFAST, Disp: 90 tablet, Rfl: 3   Multiple Vitamins-Minerals (CENTRUM SILVER 50+WOMEN) TABS, Take 1 tablet by mouth daily., Disp: , Rfl:    omeprazole (PRILOSEC) 20 MG capsule, Take 1 capsule (20 mg total) by mouth daily as needed for up to 14 days., Disp: 14 capsule, Rfl: 2   OneTouch Delica Lancets 05W MISC, 1 application by Does not apply route 3 (three) times a week. Dx:E11.9 LON:99 check BS 3x per week, Disp: 100 each, Rfl: 5   OVER THE COUNTER MEDICATION, neuriva plus OTC supplement (like prevagen), Disp: , Rfl:    pseudoephedrine (SUDAFED) 30 MG tablet, Take 1 tablet (30 mg total) by mouth every 8 (eight) hours as needed for congestion. No more than 3 tablets/month, Disp: 30 tablet, Rfl: 0   simvastatin (ZOCOR) 40 MG tablet, Take 1 tablet (40 mg total) by mouth at bedtime., Disp: 90 tablet, Rfl: 3  Current Facility-Administered Medications:    clobetasol ointment (TEMOVATE) 0.05 %, , Topical, BID, Amalia Hailey, Nyoka Lint, MD  No Known Allergies  I personally reviewed active problem list, medication list, allergies with the patient/caregiver today.  ROS  Constitutional: Negative for fever or weight change. HEENT: positive for nasal congestion, negative for sore throat  Respiratory: Positive for cough, negative for shortness of breath.   Cardiovascular: Negative for chest pain or palpitations.  Gastrointestinal: Negative for abdominal pain, no bowel changes.  Musculoskeletal: Negative for gait problem or joint swelling.  Skin: Negative for rash.  Neurological: Negative for dizziness, positive for headache.  No other specific complaints in a complete review of systems (except as listed in HPI above).   Objective  Virtual encounter, vitals not obtained.  There is no height or weight on file  to calculate BMI.  Nursing Note and Vital Signs reviewed.  Physical Exam  Awake, alert and oriented, speaking in complete sentences  No results found for this or any previous visit (from the past 72 hour(s)).  Assessment & Plan  1. COVID-19 -discussed OTC treatments for symptoms -push fluids and get rest  2. Centrilobular emphysema ([place])   -Red flags and when to present for emergency care or RTC including fever >101.36F, chest pain, shortness of breath, new/worsening/un-resolving symptoms, reviewed with patient at time of visit. Follow up and care instructions discussed and provided in AVS. - I discussed the assessment and treatment plan with the patient. The patient was provided an opportunity to ask questions and all were answered. The patient agreed with the plan and demonstrated an  understanding of the instructions.  I provided 15 minutes of non-face-to-face time during this encounter.  Bo Merino, FNP

## 2021-08-24 ENCOUNTER — Ambulatory Visit: Payer: Medicare Other | Admitting: Family Medicine

## 2021-08-28 ENCOUNTER — Encounter: Payer: Self-pay | Admitting: Family Medicine

## 2021-08-28 ENCOUNTER — Ambulatory Visit (INDEPENDENT_AMBULATORY_CARE_PROVIDER_SITE_OTHER): Payer: Medicare Other | Admitting: Family Medicine

## 2021-08-28 ENCOUNTER — Other Ambulatory Visit: Payer: Self-pay

## 2021-08-28 VITALS — BP 120/76 | HR 85 | Temp 97.6°F | Resp 16 | Ht 68.0 in | Wt 173.1 lb

## 2021-08-28 DIAGNOSIS — K219 Gastro-esophageal reflux disease without esophagitis: Secondary | ICD-10-CM | POA: Diagnosis not present

## 2021-08-28 DIAGNOSIS — E611 Iron deficiency: Secondary | ICD-10-CM

## 2021-08-28 DIAGNOSIS — G2581 Restless legs syndrome: Secondary | ICD-10-CM

## 2021-08-28 DIAGNOSIS — E1159 Type 2 diabetes mellitus with other circulatory complications: Secondary | ICD-10-CM

## 2021-08-28 DIAGNOSIS — E785 Hyperlipidemia, unspecified: Secondary | ICD-10-CM

## 2021-08-28 DIAGNOSIS — R6889 Other general symptoms and signs: Secondary | ICD-10-CM

## 2021-08-28 DIAGNOSIS — Z5181 Encounter for therapeutic drug level monitoring: Secondary | ICD-10-CM

## 2021-08-28 DIAGNOSIS — M85852 Other specified disorders of bone density and structure, left thigh: Secondary | ICD-10-CM

## 2021-08-28 DIAGNOSIS — I700 Atherosclerosis of aorta: Secondary | ICD-10-CM

## 2021-08-28 DIAGNOSIS — E1169 Type 2 diabetes mellitus with other specified complication: Secondary | ICD-10-CM

## 2021-08-28 DIAGNOSIS — E042 Nontoxic multinodular goiter: Secondary | ICD-10-CM

## 2021-08-28 DIAGNOSIS — Z1231 Encounter for screening mammogram for malignant neoplasm of breast: Secondary | ICD-10-CM

## 2021-08-28 DIAGNOSIS — I152 Hypertension secondary to endocrine disorders: Secondary | ICD-10-CM

## 2021-08-28 DIAGNOSIS — E782 Mixed hyperlipidemia: Secondary | ICD-10-CM

## 2021-08-28 DIAGNOSIS — Z87891 Personal history of nicotine dependence: Secondary | ICD-10-CM

## 2021-08-28 DIAGNOSIS — N760 Acute vaginitis: Secondary | ICD-10-CM

## 2021-08-28 MED ORDER — SIMVASTATIN 40 MG PO TABS: 40.0000 mg | ORAL_TABLET | Freq: Every day | ORAL | 3 refills | Status: DC

## 2021-08-28 NOTE — Progress Notes (Signed)
Name: Felicia Cisneros   MRN: [id number]    DOB: 09-19-1951   Date:08/28/2021       Progress Note  Chief Complaint  Patient presents with   Follow-up   Diabetes   Hyperlipidemia   Hypertension     Subjective:   Felicia Cisneros is a 70 y.o. female, presents to clinic for routine f/up  She is due for f/up bone density, mammogram, thyroid US for multiple thyroid nodule monitoring, and low-dose chest CT lung cancer screening  Hypertension:  Currently managed on lisinopril 5 mg Pt reports good med compliance and denies any SE.   Blood pressure today is well controlled. BP Readings from Last 3 Encounters:  08/28/21 120/76  02/17/21 128/72  10/28/20 134/82   Pt denies CP, SOB, exertional sx, LE edema, palpitation, Ha's, visual disturbances, lightheadedness, hypotension, syncope. Dietary efforts for BP?  Trying to be healthy   DM:   Pt managing DM with metoformin 500 mg XR once daily Reports good med compliance Pt has no SE from meds. Blood sugars - not checking Denies: Polyuria, polydipsia, vision changes, neuropathy, hypoglycemia Recent pertinent labs: Lab Results  Component Value Date   HGBA1C 5.8 (H) 02/17/2021   HGBA1C 5.9 (H) 08/18/2020   HGBA1C 5.7 (H) 02/15/2020   Lab Results  Component Value Date   MICROALBUR 0.5 08/25/2020   LDLCALC 47 02/17/2021   CREATININE 0.79 02/17/2021   Standard of care and health maintenance: Foot exam:  UTD DM eye exam:  due next year - est with specialists ACEI/ARB:  done acei Statin:  taking  GERD - acid reflux symptoms are well controlled unless she eats a triggering food like pizza  Legs hurt terrible at night- achy, sore, has to move them a little better with wearing compression stocking, reticular veins to ankles developing, chronic but stable LE edema  Continued vulvovaginitis often using external temovate, but still won't try estrogen creams - she wants urine checked today usually vulva, external labia and urethra that  are irritated seems that urine irritates it she denies dryness issues   Sig smoking hx, emphysema w/o inhalers and she denies sx, overdue for lung Ca screening low-dose CT  Thyroid nodules, no change  Recently broke ribs (oct) had covid after being on a cruise Retinal detachment and cataract surgery       Current Outpatient Medications:    acetaminophen (TYLENOL) 500 MG tablet, Take 1,000 mg by mouth every 6 (six) hours as needed (for pain.)., Disp: , Rfl:    aspirin EC 81 MG tablet, Take 81 mg by mouth at bedtime., Disp: , Rfl:    BIOTIN PO, Take by mouth daily., Disp: , Rfl:    Blood Glucose Monitoring Suppl (ONETOUCH VERIO FLEX SYSTEM) w/Device KIT, Check blood sugars up to twice daily prn Dx:E11.9 LON:99, Disp: 1 kit, Rfl: 0   Calcium-Vitamin D-Vitamin K (VIACTIV CALCIUM PLUS D) 650-12.5-40 MG-MCG-MCG CHEW, Chew 1 Troche by mouth in the morning and at bedtime., Disp: , Rfl:    clobetasol cream (TEMOVATE) 0.05 %, Apply to labia twice daily for 3 weeks then twice weekly as directed, Disp: 60 g, Rfl: 2   Coenzyme Q10 (COQ10) 100 MG CAPS, One by mouth daily, Disp: , Rfl:    famotidine (PEPCID) 20 MG tablet, Take 1 tablet (20 mg total) by mouth 2 (two) times daily., Disp: 180 tablet, Rfl: 3   glucose blood (ONETOUCH VERIO) test strip, CHECK FINGERSTICK BLOOD  SUGARS 3 TIMES WEEKLY, Disp: 100 strip, Rfl:  3   lisinopril (ZESTRIL) 5 MG tablet, TAKE 1 TABLET BY MOUTH  DAILY FOR BLOOD PRESSURE, Disp: 90 tablet, Rfl: 3   LYSINE PO, Take by mouth as needed., Disp: , Rfl:    Melatonin 2.5 MG CHEW, Chew 2.5 mg by mouth daily as needed., Disp: , Rfl:    metFORMIN (GLUCOPHAGE-XR) 500 MG 24 hr tablet, TAKE 1 TABLET BY MOUTH  DAILY WITH BREAKFAST, Disp: 90 tablet, Rfl: 3   Multiple Vitamins-Minerals (CENTRUM SILVER 50+WOMEN) TABS, Take 1 tablet by mouth daily., Disp: , Rfl:    OneTouch Delica Lancets 27N MISC, 1 application by Does not apply route 3 (three) times a week. Dx:E11.9 LON:99 check BS 3x per  week, Disp: 100 each, Rfl: 5   pseudoephedrine (SUDAFED) 30 MG tablet, Take 1 tablet (30 mg total) by mouth every 8 (eight) hours as needed for congestion. No more than 3 tablets/month, Disp: 30 tablet, Rfl: 0   simvastatin (ZOCOR) 40 MG tablet, Take 1 tablet (40 mg total) by mouth at bedtime., Disp: 90 tablet, Rfl: 3   Cobalamin Combinations (NEURIVA PLUS PO), Take 1 tablet by mouth daily. (Patient not taking: Reported on 08/28/2021), Disp: , Rfl:    omeprazole (PRILOSEC) 20 MG capsule, Take 1 capsule (20 mg total) by mouth daily as needed for up to 14 days., Disp: 14 capsule, Rfl: 2   OVER THE COUNTER MEDICATION, neuriva plus OTC supplement (like prevagen), Disp: , Rfl:   Current Facility-Administered Medications:    clobetasol ointment (TEMOVATE) 0.05 %, , Topical, BID, Harlin Heys, MD  Patient Active Problem List   Diagnosis Date Noted   Retinal detachment with single break, right 12/31/2020   GERD without esophagitis 03/06/2019   Benign neoplasm of sigmoid colon    Lower extremity edema 01/31/2018   Centrilobular emphysema ([place]) 12/02/2016   Pulmonary nodules/lesions, multiple 12/02/2016   Aortic atherosclerosis ([place]) 07/08/2016   Osteopenia 05/27/2016   Abnormality of nail tissue 11/27/2015   Herpes simplex antibody positive 08/22/2015   Essential hypertension    Type 2 diabetes mellitus, controlled ([place])    Atrophic vaginitis    History of tobacco use    Abnormal EKG 07/13/2013   Hyperlipidemia 07/13/2013    Past Surgical History:  Procedure Laterality Date   colonoscopy     COLONOSCOPY WITH PROPOFOL N/A 02/20/2018   Procedure: COLONOSCOPY WITH PROPOFOL;  Surgeon: Lucilla Lame, MD;  Location: [facility];  Service: Endoscopy;  Laterality: N/A;  Diabetic   CYSTOSCOPY WITH BIOPSY N/A 01/17/2017   Procedure: CYSTOSCOPY WITH BIOPSY;  Surgeon: Hollice Espy, MD;  Location: [facility];  Service: Urology;  Laterality: N/A;   POLYPECTOMY  02/20/2018   Procedure:  POLYPECTOMY;  Surgeon: Lucilla Lame, MD;  Location: [facility];  Service: Endoscopy;;   TMJ ARTHROPLASTY  1984    Family History  Problem Relation Age of Onset   Stroke Mother    Hypertension Mother    Hyperlipidemia Mother    Diabetes Mother        pre-diabetic   Hearing loss Mother    Diabetes Brother    Irregular heart beat Brother    COPD Father    Hearing loss Father    Thyroid disease Sister    Stroke Maternal Grandmother    Hyperlipidemia Maternal Grandmother    Hypertension Maternal Grandmother    Cancer Maternal Grandfather        bone   Cancer Sister        cystic fibroid carcinoma  Ulcerative colitis Sister    Heart disease Paternal Uncle    Heart disease Paternal Uncle    Hyperlipidemia Maternal Aunt    Stroke Maternal Aunt    Breast cancer Neg Hx    Hematuria Neg Hx    Prostate cancer Neg Hx    Renal cancer Neg Hx     Social History   Tobacco Use   Smoking status: Former    Packs/day: 3.00    Years: 45.00    Pack years: 135.00    Types: Cigarettes    Quit date: 10/25/2009    Years since quitting: 11.8   Smokeless tobacco: Never   Tobacco comments:    Quit before my husband was diagnosed with small cell cancer.  Vaping Use   Vaping Use: Never used  Substance Use Topics   Alcohol use: Not Currently    Alcohol/week: 0.0 standard drinks    Comment: Rarely   Drug use: No     No Known Allergies  Health Maintenance  Topic Date Due   DEXA SCAN  08/13/2021   HEMOGLOBIN A1C  08/19/2021   MAMMOGRAM  09/29/2021   OPHTHALMOLOGY EXAM  12/08/2021   FOOT EXAM  02/17/2022   COLONOSCOPY (Pts 45-17yr Insurance coverage will need to be confirmed)  02/21/2028   TETANUS/TDAP  07/10/2029   Pneumonia Vaccine 70 Years old  Completed   INFLUENZA VACCINE  Completed   COVID-19 Vaccine  Completed   Hepatitis C Screening  Completed   Zoster Vaccines- Shingrix  Completed   HPV VACCINES  Aged Out    Chart Review Today: I personally reviewed active  problem list, medication list, allergies, family history, social history, health maintenance, notes from last encounter, lab results, imaging with the patient/caregiver today.   Review of Systems   Objective:   Vitals:   08/28/21 0916  BP: 120/76  Pulse: 85  Resp: 16  Temp: 97.6 F (36.4 C)  TempSrc: Oral  SpO2: 97%  Weight: 173 lb 1.6 oz (78.5 kg)  Height: 5' 8"  (1.727 m)    Body mass index is 26.32 kg/m.  Physical Exam Vitals and nursing note reviewed.  Constitutional:      General: She is not in acute distress.    Appearance: Normal appearance. She is well-developed. She is not ill-appearing, toxic-appearing or diaphoretic.     Interventions: Face mask in place.  HENT:     Head: Normocephalic and atraumatic.     Right Ear: External ear normal.     Left Ear: External ear normal.  Eyes:     General: Lids are normal. No scleral icterus.       Right eye: No discharge.        Left eye: No discharge.     Conjunctiva/sclera: Conjunctivae normal.  Neck:     Trachea: Phonation normal. No tracheal deviation.  Cardiovascular:     Rate and Rhythm: Normal rate and regular rhythm.     Pulses: Normal pulses.          Radial pulses are 2+ on the right side and 2+ on the left side.       Posterior tibial pulses are 2+ on the right side and 2+ on the left side.     Heart sounds: Normal heart sounds. No murmur heard.   No friction rub. No gallop.     Comments: Non-pitting pedal edema bilaterally Pulmonary:     Effort: Pulmonary effort is normal.     Breath sounds: Normal breath sounds.  Abdominal:     General: Bowel sounds are normal. There is no distension.     Palpations: Abdomen is soft.  Musculoskeletal:     Right lower leg: Edema present.     Left lower leg: Edema present.  Skin:    General: Skin is warm and dry.     Coloration: Skin is not jaundiced or pale.     Findings: No rash.  Neurological:     Mental Status: She is alert.     Motor: No abnormal muscle tone.      Gait: Gait normal.  Psychiatric:        Mood and Affect: Mood normal.        Speech: Speech normal.        Behavior: Behavior normal.        Assessment & Plan:   1. Controlled type 2 diabetes mellitus with other circulatory complication, without long-term current use of insulin (HCC) DM has been stable and well controlled on metformin 500 mg XR and with diet efforts UTD on foot and eye exam, on statin and ACEI Lab Results  Component Value Date   HGBA1C 5.8 (H) 02/17/2021   - COMPLETE METABOLIC PANEL WITH GFR - Hemoglobin A1C  2. Hypertension associated with type 2 diabetes mellitus (HCC) BP Readings from Last 3 Encounters:  08/28/21 120/76  02/17/21 128/72  10/28/20 134/82   Lab Results  Component Value Date   CREATININE 0.79 02/17/2021   Lab Results  Component Value Date   BUN 28 (H) 02/17/2021   Lab Results  Component Value Date   GFRNONAA 76 02/17/2021   Well controlled, compliant with meds, no SE or concerning sx Recheck renal function and electrolytes Pt encouraged to continue to work on healthy diet (low salt) and lifestyle for improving HTN management - DASH diet handout offered today   - COMPLETE METABOLIC PANEL WITH GFR - Vitamin B12  3. Hyperlipidemia associated with type 2 diabetes mellitus ([place]) Compliant with meds, no SE, no myalgias, fatigue or jaundice Last lipids reviewed and at goal Diet and exercise recommendations for HLD reviewed and handout given  - COMPLETE METABOLIC PANEL WITH GFR  4. GERD without esophagitis Stable sx, food triggers well managed with pepcid or tums, not using PPI currently  5. Encounter for medication monitoring  - COMPLETE METABOLIC PANEL WITH GFR - Hemoglobin A1C - CBC with Differential/Platelet  6. Osteopenia of neck of left femur Continue preventative measures - COMPLETE METABOLIC PANEL WITH GFR - DG Bone Density; Future  7. Multiple thyroid nodules No change/no clinical sx - COMPLETE METABOLIC  PANEL WITH GFR - CBC with Differential/Platelet - US THYROID  8. Aortic atherosclerosis (HCC) On statin, monitoring - COMPLETE METABOLIC PANEL WITH GFR - simvastatin (ZOCOR) 40 MG tablet; Take 1 tablet (40 mg total) by mouth at bedtime.  Dispense: 90 tablet; Refill: 3  9. Mixed hyperlipidemia See above - well controlled - COMPLETE METABOLIC PANEL WITH GFR - simvastatin (ZOCOR) 40 MG tablet; Take 1 tablet (40 mg total) by mouth at bedtime.  Dispense: 90 tablet; Refill: 3  10. Encounter for screening mammogram for malignant neoplasm of breast Due Dec - MM 3D SCREEN BREAST BILATERAL; Future  11. History of tobacco use Lost to f/up for low-dose chest CT screening - Ambulatory Referral Lung Cancer Screening  Pulmonary  12. Vulvovaginitis Again encouraged to try external application of estrogen creams to manage and prevent sx - Urinalysis, Routine w reflex microscopic  13. Iron deficiency She is doing blood/platelets/plasma  donations several times a year - feels funny when she does it, leg sx worsening, screen iron panel - Vitamin B12 - Iron, TIBC and Ferritin Panel  14. Other general symptoms and signs Paresthesias, RLS type symptoms - Vitamin B12 - Iron, TIBC and Ferritin Panel  15. Hypocalcemia Recurrent - last labs normal Will monitor with CMP  16. Restless leg syndrome - Vitamin B12 - Iron, TIBC and Ferritin Panel    Return in about 6 months (around 02/25/2022) for Routine follow-up.   [name], PA-C 08/28/21 9:26 AM

## 2021-08-28 NOTE — Patient Instructions (Signed)
Health Maintenance  Topic Date Due   DEXA SCAN  08/13/2021   HEMOGLOBIN A1C  08/19/2021   MAMMOGRAM  09/29/2021   OPHTHALMOLOGY EXAM  12/08/2021   FOOT EXAM  02/17/2022   COLONOSCOPY (Pts 45-24yrs Insurance coverage will need to be confirmed)  02/21/2028   TETANUS/TDAP  07/10/2029   Pneumonia Vaccine 79+ Years old  Completed   INFLUENZA VACCINE  Completed   COVID-19 Vaccine  Completed   Hepatitis C Screening  Completed   Zoster Vaccines- Shingrix  Completed   HPV VACCINES  Aged Out   Please call [facility] to set up bone density scan and mammogram - you may be able to complete them at the same time  You should hear from scheduling about your follow up thyroid ultrasound  I will reach out to the lung cancer screening team to make sure you still are in the program to do your low-dose CT

## 2021-08-29 LAB — URINALYSIS, ROUTINE W REFLEX MICROSCOPIC
Bacteria, UA: NONE SEEN /HPF
Bilirubin Urine: NEGATIVE
Glucose, UA: NEGATIVE
Hyaline Cast: NONE SEEN /LPF
Ketones, ur: NEGATIVE
Nitrite: NEGATIVE
Protein, ur: NEGATIVE
Specific Gravity, Urine: 1.022 (ref 1.001–1.035)
Squamous Epithelial / HPF: NONE SEEN /HPF (ref <=5)
pH: <=5 (ref 5.0–8.0)

## 2021-08-29 LAB — HEMOGLOBIN A1C
Hgb A1c MFr Bld: 5.9 % of total Hgb — ABNORMAL HIGH (ref <5.7)
Mean Plasma Glucose: 123 mg/dL
eAG (mmol/L): 6.8 mmol/L

## 2021-08-29 LAB — IRON,TIBC AND FERRITIN PANEL
%SAT: 12 % (calc) — ABNORMAL LOW (ref 16–45)
Ferritin: 9 ng/mL — ABNORMAL LOW (ref 16–288)
Iron: 49 ug/dL (ref 45–160)
TIBC: 415 mcg/dL (calc) (ref 250–450)

## 2021-08-29 LAB — COMPLETE METABOLIC PANEL WITH GFR
AG Ratio: 2 (calc) (ref 1.0–2.5)
ALT: 12 U/L (ref 6–29)
AST: 15 U/L (ref 10–35)
Albumin: 4.5 g/dL (ref 3.6–5.1)
Alkaline phosphatase (APISO): 78 U/L (ref 37–153)
BUN: 24 mg/dL (ref 7–25)
CO2: 27 mmol/L (ref 20–32)
Calcium: 9.6 mg/dL (ref 8.6–10.4)
Chloride: 105 mmol/L (ref 98–110)
Creat: 0.75 mg/dL (ref 0.60–1.00)
Globulin: 2.3 g/dL (calc) (ref 1.9–3.7)
Glucose, Bld: 108 mg/dL — ABNORMAL HIGH (ref 65–99)
Potassium: 4.4 mmol/L (ref 3.5–5.3)
Sodium: 140 mmol/L (ref 135–146)
Total Bilirubin: 0.5 mg/dL (ref 0.2–1.2)
Total Protein: 6.8 g/dL (ref 6.1–8.1)
eGFR: 86 mL/min/{1.73_m2} (ref >=60)

## 2021-08-29 LAB — CBC WITH DIFFERENTIAL/PLATELET
Absolute Monocytes: 481 cells/uL (ref 200–950)
Basophils Absolute: 59 cells/uL (ref 0–200)
Basophils Relative: 0.8 %
Eosinophils Absolute: 170 cells/uL (ref 15–500)
Eosinophils Relative: 2.3 %
HCT: 39.2 % (ref 35.0–45.0)
Hemoglobin: 12.4 g/dL (ref 11.7–15.5)
Lymphs Abs: 1332 cells/uL (ref 850–3900)
MCH: 25.5 pg — ABNORMAL LOW (ref 27.0–33.0)
MCHC: 31.6 g/dL — ABNORMAL LOW (ref 32.0–36.0)
MCV: 80.7 fL (ref 80.0–100.0)
MPV: 10.7 fL (ref 7.5–12.5)
Monocytes Relative: 6.5 %
Neutro Abs: 5358 cells/uL (ref 1500–7800)
Neutrophils Relative %: 72.4 %
Platelets: 361 10*3/uL (ref 140–400)
RBC: 4.86 10*6/uL (ref 3.80–5.10)
RDW: 15.8 % — ABNORMAL HIGH (ref 11.0–15.0)
Total Lymphocyte: 18 %
WBC: 7.4 10*3/uL (ref 3.8–10.8)

## 2021-08-29 LAB — MICROSCOPIC MESSAGE

## 2021-08-29 LAB — VITAMIN B12: Vitamin B-12: 1107 pg/mL — ABNORMAL HIGH (ref 200–1100)

## 2021-09-01 ENCOUNTER — Encounter: Payer: Self-pay | Admitting: Family Medicine

## 2021-09-01 ENCOUNTER — Other Ambulatory Visit: Payer: Self-pay

## 2021-09-01 ENCOUNTER — Other Ambulatory Visit: Payer: Self-pay | Admitting: Nurse Practitioner

## 2021-09-01 DIAGNOSIS — R300 Dysuria: Secondary | ICD-10-CM

## 2021-09-01 DIAGNOSIS — E119 Type 2 diabetes mellitus without complications: Secondary | ICD-10-CM

## 2021-09-01 MED ORDER — CEPHALEXIN 500 MG PO CAPS: 500.0000 mg | ORAL_CAPSULE | Freq: Two times a day (BID) | ORAL | 0 refills | Status: DC

## 2021-09-01 MED ORDER — ONETOUCH VERIO FLEX SYSTEM W/DEVICE KIT: PACK | 0 refills | Status: DC

## 2021-09-03 ENCOUNTER — Telehealth: Payer: Self-pay

## 2021-09-03 LAB — URINE CULTURE
MICRO NUMBER:: 12616071
Result:: NO GROWTH
SPECIMEN QUALITY:: ADEQUATE

## 2021-09-03 NOTE — Telephone Encounter (Signed)
Pt notified, results will probably not be back until tomorrow or later

## 2021-09-03 NOTE — Telephone Encounter (Signed)
Copied from [place] [id number]. Topic: General - Other >> Sep 03, 2021 12:28 PM Tessa Lerner A wrote: Reason for CRM: The patient would like to know if there will be an antibiotic called in for them to help with their, previously discussed, urinary discomfort   The patient has been in touch with their pharmacy and told that nothing had been called in for them yet  The patient would also like to know if the results of their urine culture have been received   The patient would like [facility] [place] [facility] [address]  Please contact further >> Sep 03, 2021 12:34 PM Tessa Lerner A wrote: [facility] [place], [place] [facility] [place] [address] [place] [address] Phone: [phone] Fax: [phone] Hours: Not open 24 hours

## 2021-09-08 ENCOUNTER — Ambulatory Visit (INDEPENDENT_AMBULATORY_CARE_PROVIDER_SITE_OTHER): Payer: Medicare Other

## 2021-09-08 DIAGNOSIS — Z122 Encounter for screening for malignant neoplasm of respiratory organs: Secondary | ICD-10-CM

## 2021-09-08 DIAGNOSIS — Z0000 Encounter for general adult medical examination without abnormal findings: Secondary | ICD-10-CM

## 2021-09-08 NOTE — Progress Notes (Signed)
Subjective:   Felicia Cisneros is a 70 y.o. female who presents for Medicare Annual (Subsequent) preventive examination.  Virtual Visit via Telephone Note  I connected with  [name] on 09/08/21 at  9:20 AM EST by telephone and verified that I am speaking with the correct person using two identifiers.  Location: Patient: home Provider: [place] Persons participating in the virtual visit: [place]   I discussed the limitations, risks, security and privacy concerns of performing an evaluation and management service by telephone and the availability of in person appointments. The patient expressed understanding and agreed to proceed.  Interactive audio and video telecommunications were attempted between this nurse and patient, however failed, due to patient having technical difficulties OR patient did not have access to video capability.  We continued and completed visit with audio only.  Some vital signs may be absent or patient reported.   Clemetine Marker, LPN   Review of Systems     Cardiac Risk Factors include: advanced age (>53mn, >>22women);diabetes mellitus;hypertension;dyslipidemia     Objective:    There were no vitals filed for this visit. There is no height or weight on file to calculate BMI.  Advanced Directives 09/08/2021 07/08/2020 05/14/2020 07/03/2019 06/05/2019 08/10/2018 06/29/2018  Does Patient Have a Medical Advance Directive? Yes Yes Yes Yes No Yes Yes  Type of AParamedicof AElsberryLiving will H[place]leLiving will HFairforestLiving will HMcDermottLiving will - H[place]burnettLiving will;Out of facility DNR (pink MOST or yellow form) HFederal HeightsLiving will  Does patient want to make changes to medical advance directive? - - - - - No - Patient declined -  Copy of [place]Northchasein Chart? No - copy requested No - copy  requested - No - copy requested - No - copy requested No - copy requested  Would patient like information on creating a medical advance directive? - - - - No - Patient declined - -    Current Medications (verified) Outpatient Encounter Medications as of 09/08/2021  Medication Sig   acetaminophen (TYLENOL) 500 MG tablet Take 1,000 mg by mouth every 6 (six) hours as needed (for pain.).   aspirin EC 81 MG tablet Take 81 mg by mouth at bedtime.   BIOTIN PO Take by mouth daily.   Blood Glucose Monitoring Suppl (ONETOUCH VERIO FLEX SYSTEM) w/Device KIT Check blood sugars up to twice daily prn Dx:E11.9 LON:99   Calcium-Vitamin D-Vitamin K (VIACTIV CALCIUM PLUS D) 650-12.5-40 MG-MCG-MCG CHEW Chew 1 [place] by mouth in the morning and at bedtime.   clobetasol cream (TEMOVATE) 0.05 % Apply to labia twice daily for 3 weeks then twice weekly as directed   Cobalamin Combinations (NEURIVA PLUS PO) Take 1 tablet by mouth daily.   Coenzyme Q10 (COQ10) 100 MG CAPS One by mouth daily   famotidine (PEPCID) 20 MG tablet Take 1 tablet (20 mg total) by mouth 2 (two) times daily.   glucose blood (ONETOUCH VERIO) test strip CHECK FINGERSTICK BLOOD  SUGARS 3 TIMES WEEKLY   lisinopril (ZESTRIL) 5 MG tablet TAKE 1 TABLET BY MOUTH  DAILY FOR BLOOD PRESSURE   Melatonin 2.5 MG CHEW Chew 2.5 mg by mouth daily as needed.   metFORMIN (GLUCOPHAGE-XR) 500 MG 24 hr tablet TAKE 1 TABLET BY MOUTH  DAILY WITH BREAKFAST   Multiple Vitamins-Minerals (CENTRUM SILVER 50+WOMEN) TABS Take 1 tablet by mouth daily.   OneTouch Delica Lancets 367H  MISC 1 application by Does not apply route 3 (three) times a week. Dx:E11.9 LON:99 check BS 3x per week   pseudoephedrine (SUDAFED) 30 MG tablet Take 1 tablet (30 mg total) by mouth every 8 (eight) hours as needed for congestion. No more than 3 tablets/month   simvastatin (ZOCOR) 40 MG tablet Take 1 tablet (40 mg total) by mouth at bedtime.   [DISCONTINUED] cephALEXin (KEFLEX) 500 MG capsule Take 1  capsule (500 mg total) by mouth 2 (two) times daily.   [DISCONTINUED] LYSINE PO Take by mouth as needed.   [DISCONTINUED] OVER THE COUNTER MEDICATION neuriva plus OTC supplement (like prevagen)   Facility-Administered Encounter Medications as of 09/08/2021  Medication   clobetasol ointment (TEMOVATE) 0.05 %    Allergies (verified) Patient has no known allergies.   History: Past Medical History:  Diagnosis Date   Aortic atherosclerosis ([place])    a. 11/2017 noted on Chest CT - Aortic and branch vessel atherosclerosis.   Atrophic vaginitis    Chest pain    a. 06/2013 ETT: Ex time 6:00, Max HR 136 (87%), 7.1 METS. No ECG changes; b. 07/2018 MV: Ex time: 6:42, nl EF, No ischemia.   COPD (chronic obstructive pulmonary disease) ([place])    a. 11/2017 Chest CT: moderate centrilobular emphysema.   Diabetes mellitus without complication ([place])    Diverticulitis    DNAR (do not attempt resuscitation) 06/09/2017   Discussed today, 06/09/2017   Essential hypertension    GERD (gastroesophageal reflux disease)    History of kidney stones    History of tobacco use    Hyperlipidemia    IFG (impaired fasting glucose)    Irregular heart beat 2018   Leukorrhea, not specified as infective    Osteopenia 05/27/2016   August 2017   Personal history of tobacco use, presenting hazards to health 08/20/2015   Pulmonary nodules    a. 11/2017 CT Chest: No change in bilat pulm nodules, including an ant LUL nodule - 5.46m.   Past Surgical History:  Procedure Laterality Date   colonoscopy     COLONOSCOPY WITH PROPOFOL N/A 02/20/2018   Procedure: COLONOSCOPY WITH PROPOFOL;  Surgeon: WLucilla Lame MD;  [place] [facility]  Service: Endoscopy;  Laterality: N/A;  Diabetic   CYSTOSCOPY WITH BIOPSY N/A 01/17/2017   Procedure: CYSTOSCOPY WITH BIOPSY;  Surgeon: AHollice Espy MD;  Location: [facility];  Service: Urology;  Laterality: N/A;   POLYPECTOMY  02/20/2018   Procedure: POLYPECTOMY;  Surgeon: WLucilla Lame  MD;  [place] [facility]  Service: Endoscopy;;   TMJ ARTHROPLASTY  1984   Family History  Problem Relation Age of Onset   Stroke Mother    Hypertension Mother    Hyperlipidemia Mother    Diabetes Mother        pre-diabetic   Hearing loss Mother    Diabetes Brother    Irregular heart beat Brother    COPD Father    Hearing loss Father    Thyroid disease Sister    Stroke Maternal Grandmother    Hyperlipidemia Maternal Grandmother    Hypertension Maternal Grandmother    Cancer Maternal Grandfather        bone   Cancer Sister        cystic fibroid carcinoma   Ulcerative colitis Sister    Heart disease Paternal Uncle    Heart disease Paternal Uncle    Hyperlipidemia Maternal Aunt    Stroke Maternal Aunt    Breast cancer Neg Hx  Hematuria Neg Hx    Prostate cancer Neg Hx    Renal cancer Neg Hx    Social History   Socioeconomic History   Marital status: Widowed    Spouse name: Charlyne Mom   Number of children: 0   Years of education: some college   Highest education level: 12th grade  Occupational History    Employer: [employer]  Tobacco Use   Smoking status: Former    Packs/day: 3.00    Years: 45.00    Pack years: 135.00    Types: Cigarettes    Quit date: 10/25/2009    Years since quitting: 11.8   Smokeless tobacco: Never   Tobacco comments:    Quit before my husband was diagnosed with small cell cancer.  Vaping Use   Vaping Use: Never used  Substance and Sexual Activity   Alcohol use: Not Currently    Comment: Rarely   Drug use: No   Sexual activity: Not Currently    Birth control/protection: None  Other Topics Concern   Not on file  Social History Narrative   Not on file   Social Determinants of Health   Financial Resource Strain: Low Risk    Difficulty of Paying Living Expenses: Not hard at all  Food Insecurity: No Food Insecurity   Worried About Charity fundraiser in the Last Year: Never true   [place] in the  Last Year: Never true  Transportation Needs: No Transportation Needs   Lack of Transportation (Medical): No   Lack of Transportation (Non-Medical): No  Physical Activity: Insufficiently Active   Days of Exercise per Week: 2 days   Minutes of Exercise per Session: 60 min  Stress: No Stress Concern Present   Feeling of Stress : Not at all  Social Connections: Socially Isolated   Frequency of Communication with Friends and Family: More than three times a week   Frequency of Social Gatherings with Friends and Family: Three times a week   Attends Religious Services: Never   Active Member of Clubs or Organizations: No   Attends Archivist Meetings: Never   Marital Status: Widowed    Tobacco Counseling Counseling given: Not Answered Tobacco comments: Quit before my husband was diagnosed with small cell cancer.   Clinical Intake:  Pre-visit preparation completed: Yes  Pain : No/denies pain     Nutritional Risks: None Diabetes: Yes CBG done?: No Did pt. bring in CBG monitor from home?: No  How often do you need to have someone help you when you read instructions, pamphlets, or other written materials from your doctor or pharmacy?: 1 - Never  Nutrition Risk Assessment:  Has the patient had any N/V/D within the last 2 months?  No  Does the patient have any non-healing wounds?  No  Has the patient had any unintentional weight loss or weight gain?  No   Diabetes:  Is the patient diabetic?  Yes  If diabetic, was a CBG obtained today?  No  Did the patient bring in their glucometer from home?  No  How often do you monitor your CBG's? Several times per week  Financial Strains and Diabetes Management:  Are you having any financial strains with the device, your supplies or your medication? No .  Does the patient want to be seen by Chronic Care Management for management of their diabetes?  No  Would the patient like to be referred to a Nutritionist or for Diabetic  Management?  No  Diabetic Exams:  Diabetic Eye Exam: Completed 12/08/20 negative retinopathy.   Diabetic Foot Exam: Completed 02/17/21.   Interpreter Needed?: No  Information entered by :: Clemetine Marker LPN   Activities of Daily Living In your present state of health, do you have any difficulty performing the following activities: 09/08/2021 08/28/2021  Hearing? Tempie Donning  Vision? N N  Difficulty concentrating or making decisions? N N  Walking or climbing stairs? Y Y  Dressing or bathing? N N  Doing errands, shopping? N N  Preparing Food and eating ? N -  Using the Toilet? N -  In the past six months, have you accidently leaked urine? N -  Do you have problems with loss of bowel control? N -  Managing your Medications? N -  Managing your Finances? N -  Housekeeping or managing your Housekeeping? N -  Some recent data might be hidden    Patient Care Team: [name], PA-C as PCP - General (Family Medicine) Oneta Rack, MD as Consulting Physician (Dermatology) Minna Merritts, MD as Consulting Physician (Cardiology) Lucilla Lame, MD as Consulting Physician (Gastroenterology) Germaine Pomfret, [facility] as Pharmacist (Pharmacist)  Indicate any recent Medical Services you may have received from other than [facility] providers in the past year (date may be approximate).     Assessment:   This is a routine wellness examination for [place].  Hearing/Vision screen Hearing Screening - Comments:: Pt c/o mild hearing difficulty but states hearing evaluation stated she does not need hearing aids Vision Screening - Comments:: Annual vision screenings done by [facility] in [place]. Pt also sees Dr. Suezanne Cheshire in [facility] for retina specialist;   Dietary issues and exercise activities discussed: Current Exercise Habits: Structured exercise class, Type of exercise: calisthenics;stretching, Time (Minutes): 60, Frequency (Times/Week): 2, Weekly Exercise (Minutes/Week): 120, Intensity:  Moderate, Exercise limited by: None identified   Goals Addressed             This Visit's Progress    DIET - INCREASE WATER INTAKE   Not on track    Recommend to drink at least 6-8 8oz glasses of water per day.     Monitor and Manage My Blood Sugar-Diabetes Type 2   On track    Timeframe:  Long-Range Goal Priority:  High Start Date:  06/04/2021                            Expected End Date:   06/04/2022                    Follow Up Date 09/23/2021    - check blood sugar at prescribed times - check blood sugar if I feel it is too high or too low - take the blood sugar log to all doctor visits    Why is this important?   Checking your blood sugar at home helps to keep it from getting very high or very low.  Writing the results in a diary or log helps the doctor know how to care for you.  Your blood sugar log should have the time, date and the results.  Also, write down the amount of insulin or other medicine that you take.  Other information, like what you ate, exercise done and how you were feeling, will also be helpful.     Notes:        Depression Screen PHQ 2/9 Scores 09/08/2021 08/28/2021 08/21/2021 02/17/2021 11/04/2020 10/28/2020 08/18/2020  PHQ - 2 Score 1 1 0 2 0 0 0  PHQ- 9 Score 3 1 - 3 - - -    Fall Risk Fall Risk  09/08/2021 08/28/2021 08/21/2021 02/17/2021 11/04/2020  Falls in the past year? 1 1 0 0 0  Comment - - - - -  Number falls in past yr: 0 0 0 0 0  Injury with Fall? 1 1 0 0 0  Risk for fall due to : History of fall(s) Impaired balance/gait - - -  Risk for fall due to: Comment - - - - -  Follow up Falls prevention discussed Falls prevention discussed Falls evaluation completed - -    FALL RISK PREVENTION PERTAINING TO THE HOME:  Any stairs in or around the home? Yes  If so, are there any without handrails? No  Home free of loose throw rugs in walkways, pet beds, electrical cords, etc? Yes  Adequate lighting in your home to reduce risk of falls? Yes    ASSISTIVE DEVICES UTILIZED TO PREVENT FALLS:  Life alert? No  Use of a cane, walker or w/c? No  Grab bars in the bathroom? Yes  Shower chair or bench in shower? No  Elevated toilet seat or a handicapped toilet? Yes   TIMED UP AND GO:  Was the test performed? No . Telephonic visit  Cognitive Function: Normal cognitive status assessed by direct observation by this Nurse Health Advisor. No abnormalities found.       6CIT Screen 07/08/2020 07/03/2019 06/29/2018  What Year? 0 points 0 points 0 points  What month? 0 points 0 points 0 points  What time? 0 points 0 points 0 points  Count back from 20 0 points 0 points 0 points  Months in reverse 0 points 0 points 0 points  Repeat phrase 0 points 4 points 0 points  Total Score 0 4 0    Immunizations Immunization History  Administered Date(s) Administered   Fluad Quad(high Dose 65+) 07/08/2020   Influenza, High Dose Seasonal PF 06/12/2017, 06/29/2018, 06/13/2019   Influenza,inj,Quad PF,6+ Mos 08/22/2015   Influenza-Unspecified 06/15/2017, 07/08/2021   PFIZER Comirnaty(Gray Top)Covid-19 Tri-Sucrose Vaccine 03/18/2021   PFIZER(Purple Top)SARS-COV-2 Vaccination 12/16/2019, 01/08/2020   PNEUMOCOCCAL CONJUGATE-20 02/18/2021   Pfizer Covid-19 Vaccine Bivalent Booster 22yr & up 07/08/2021   Pneumococcal Conjugate-13 06/09/2017   Pneumococcal Polysaccharide-23 11/27/2015   Td 07/11/2019   Tdap 10/25/2008, 07/11/2019   Zoster Recombinat (Shingrix) 07/11/2019, 12/05/2019   Zoster, Live 02/27/2016    TDAP status: Up to date  Flu Vaccine status: Up to date  Pneumococcal vaccine status: Up to date  Covid-19 vaccine status: Completed vaccines  Qualifies for Shingles Vaccine? Yes   Zostavax completed Yes   Shingrix Completed?: Yes  Screening Tests Health Maintenance  Topic Date Due   DEXA SCAN  08/13/2021   MAMMOGRAM  09/29/2021   OPHTHALMOLOGY EXAM  12/08/2021   FOOT EXAM  02/17/2022   HEMOGLOBIN A1C  02/25/2022   COLONOSCOPY  (Pts 45-47yrInsurance coverage will need to be confirmed)  02/21/2028   TETANUS/TDAP  07/10/2029   Pneumonia Vaccine 6587Years old  Completed   INFLUENZA VACCINE  Completed   COVID-19 Vaccine  Completed   Hepatitis C Screening  Completed   Zoster Vaccines- Shingrix  Completed   HPV VACCINES  Aged Out    Health Maintenance  Health Maintenance Due  Topic Date Due   DEXA SCAN  08/13/2021    Colorectal cancer screening: Type of screening: Colonoscopy. Completed 02/20/18. Repeat every 10  years  Mammogram status: Completed 09/29/20. Repeat every year. Scheduled for 10/06/21  Bone Density status: Completed 08/14/19. Results reflect: Bone density results: OSTEOPENIA. Repeat every 2 years. Scheduled for 10/06/21  Lung Cancer Screening: (Low Dose CT Chest recommended if Age 53-80 years, 30 pack-year currently smoking OR have quit w/in 15years.) does qualify.   Lung Cancer Screening Referral: A referral has been [facility] to [facility]monary Lung Cancer Screening regarding the possible need for this exam. The patient's chart will be reviewed to determine if they qualify and the patient will be contacted to facilitate the scheduling of the Low Dose Chest CT for lung cancer screening.    Additional Screening:  Hepatitis C Screening: does qualify; Completed 06/24/20  Vision Screening: Recommended annual ophthalmology exams for early detection of glaucoma and other disorders of the eye. Is the patient up to date with their annual eye exam?  Yes  Who is the provider or what is the name of the office in which the patient attends annual eye exams? [facility]   Dental Screening: Recommended annual dental exams for proper oral hygiene  Community Resource Referral / Chronic Care Management: CRR required this visit?  No   CCM required this visit?  No      Plan:     I have personally reviewed and noted the following in the patient's chart:   Medical and social history Use of alcohol,  tobacco or illicit drugs  Current medications and supplements including opioid prescriptions.  Functional ability and status Nutritional status Physical activity Advanced directives List of other physicians Hospitalizations, surgeries, and ER visits in previous 12 months Vitals Screenings to include cognitive, depression, and falls Referrals and appointments  In addition, I have reviewed and discussed with patient certain preventive protocols, quality metrics, and best practice recommendations. A written personalized care plan for preventive services as well as general preventive health recommendations were provided to patient.     Clemetine Marker, LPN   [id number]   Nurse Notes: none

## 2021-09-08 NOTE — Patient Instructions (Signed)
Ms. Bebeau , Thank you for taking time to come for your Medicare Wellness Visit. I appreciate your ongoing commitment to your health goals. Please review the following plan we discussed and let me know if I can assist you in the future.   Screening recommendations/referrals: Colonoscopy: done 02/20/18 Mammogram: done 09/29/20. Scheduled for 10/06/21 Bone Density: done 08/14/19. Scheduled for 10/06/21 Recommended yearly ophthalmology/optometry visit for glaucoma screening and checkup Recommended yearly dental visit for hygiene and checkup  Vaccinations: Influenza vaccine: done 07/08/21 Pneumococcal vaccine: done 06/09/17 Tdap vaccine: done 07/11/19 Shingles vaccine: done 07/11/19 & 12/05/19   Covid-19:done 12/16/19, 01/08/20, 03/18/21 & 07/08/21  Advanced directives: Please bring a copy of your health care power of attorney and living will to the office at your convenience.   Conditions/risks identified: Recommend drinking 6-8 glasses of water per day    Next appointment: Follow up in one year for your annual wellness visit    Preventive Care 65 Years and Older, Female Preventive care refers to lifestyle choices and visits with your health care provider that can promote health and wellness. What does preventive care include? A yearly physical exam. This is also called an annual well check. Dental exams once or twice a year. Routine eye exams. Ask your health care provider how often you should have your eyes checked. Personal lifestyle choices, including: Daily care of your teeth and gums. Regular physical activity. Eating a healthy diet. Avoiding tobacco and drug use. Limiting alcohol use. Practicing safe sex. Taking low-dose aspirin every day. Taking vitamin and mineral supplements as recommended by your health care provider. What happens during an annual well check? The services and screenings done by your health care provider during your annual well check will depend on your age,  overall health, lifestyle risk factors, and family history of disease. Counseling  Your health care provider may ask you questions about your: Alcohol use. Tobacco use. Drug use. Emotional well-being. Home and relationship well-being. Sexual activity. Eating habits. History of falls. Memory and ability to understand (cognition). Work and work Statistician. Reproductive health. Screening  You may have the following tests or measurements: Height, weight, and BMI. Blood pressure. Lipid and cholesterol levels. These may be checked every 5 years, or more frequently if you are over 86 years old. Skin check. Lung cancer screening. You may have this screening every year starting at age 34 if you have a 30-pack-year history of smoking and currently smoke or have quit within the past 15 years. Fecal occult blood test (FOBT) of the stool. You may have this test every year starting at age 31. Flexible sigmoidoscopy or colonoscopy. You may have a sigmoidoscopy every 5 years or a colonoscopy every 10 years starting at age 65. Hepatitis C blood test. Hepatitis B blood test. Sexually transmitted disease (STD) testing. Diabetes screening. This is done by checking your blood sugar (glucose) after you have not eaten for a while (fasting). You may have this done every 1-3 years. Bone density scan. This is done to screen for osteoporosis. You may have this done starting at age 63. Mammogram. This may be done every 1-2 years. Talk to your health care provider about how often you should have regular mammograms. Talk with your health care provider about your test results, treatment options, and if necessary, the need for more tests. Vaccines  Your health care provider may recommend certain vaccines, such as: Influenza vaccine. This is recommended every year. Tetanus, diphtheria, and acellular pertussis (Tdap, Td) vaccine. You may need a  Td booster every 10 years. Zoster vaccine. You may need this after age  90. Pneumococcal 13-valent conjugate (PCV13) vaccine. One dose is recommended after age 16. Pneumococcal polysaccharide (PPSV23) vaccine. One dose is recommended after age 69. Talk to your health care provider about which screenings and vaccines you need and how often you need them. This information is not intended to replace advice given to you by your health care provider. Make sure you discuss any questions you have with your health care provider. Document Released: 11/07/2015 Document Revised: 06/30/2016 Document Reviewed: 08/12/2015 Elsevier Interactive Patient Education  2017 [employer] Prevention in the Home Falls can cause injuries. They can happen to people of all ages. There are many things you can do to make your home safe and to help prevent falls. What can I do on the outside of my home? Regularly fix the edges of walkways and driveways and fix any cracks. Remove anything that might make you trip as you walk through a door, such as a raised step or threshold. Trim any bushes or trees on the path to your home. Use bright outdoor lighting. Clear any walking paths of anything that might make someone trip, such as rocks or tools. Regularly check to see if handrails are loose or broken. Make sure that both sides of any steps have handrails. Any raised decks and porches should have guardrails on the edges. Have any leaves, snow, or ice cleared regularly. Use sand or salt on walking paths during winter. Clean up any spills in your garage right away. This includes oil or grease spills. What can I do in the bathroom? Use night lights. Install grab bars by the toilet and in the tub and shower. Do not use towel bars as grab bars. Use non-skid mats or decals in the tub or shower. If you need to sit down in the shower, use a plastic, non-slip stool. Keep the floor dry. Clean up any water that spills on the floor as soon as it happens. Remove soap buildup in the tub or shower  regularly. Attach bath mats securely with double-sided non-slip rug tape. Do not have throw rugs and other things on the floor that can make you trip. What can I do in the bedroom? Use night lights. Make sure that you have a light by your bed that is easy to reach. Do not use any sheets or blankets that are too big for your bed. They should not hang down onto the floor. Have a firm chair that has side arms. You can use this for support while you get dressed. Do not have throw rugs and other things on the floor that can make you trip. What can I do in the kitchen? Clean up any spills right away. Avoid walking on wet floors. Keep items that you use a lot in easy-to-reach places. If you need to reach something above you, use a strong step stool that has a grab bar. Keep electrical cords out of the way. Do not use floor polish or wax that makes floors slippery. If you must use wax, use non-skid floor wax. Do not have throw rugs and other things on the floor that can make you trip. What can I do with my stairs? Do not leave any items on the stairs. Make sure that there are handrails on both sides of the stairs and use them. Fix handrails that are broken or loose. Make sure that handrails are as long as the stairways. Check any  carpeting to make sure that it is firmly attached to the stairs. Fix any carpet that is loose or worn. Avoid having throw rugs at the top or bottom of the stairs. If you do have throw rugs, attach them to the floor with carpet tape. Make sure that you have a light switch at the top of the stairs and the bottom of the stairs. If you do not have them, ask someone to add them for you. What else can I do to help prevent falls? Wear shoes that: Do not have high heels. Have rubber bottoms. Are comfortable and fit you well. Are closed at the toe. Do not wear sandals. If you use a stepladder: Make sure that it is fully opened. Do not climb a closed stepladder. Make sure that  both sides of the stepladder are locked into place. Ask someone to hold it for you, if possible. Clearly mark and make sure that you can see: Any grab bars or handrails. First and last steps. Where the edge of each step is. Use tools that help you move around (mobility aids) if they are needed. These include: Canes. Walkers. Scooters. Crutches. Turn on the lights when you go into a dark area. Replace any light bulbs as soon as they burn out. Set up your furniture so you have a clear path. Avoid moving your furniture around. If any of your floors are uneven, fix them. If there are any pets around you, be aware of where they are. Review your medicines with your doctor. Some medicines can make you feel dizzy. This can increase your chance of falling. Ask your doctor what other things that you can do to help prevent falls. This information is not intended to replace advice given to you by your health care provider. Make sure you discuss any questions you have with your health care provider. Document Released: 08/07/2009 Document Revised: 03/18/2016 Document Reviewed: 11/15/2014 Elsevier Interactive Patient Education  2017 [employer].

## 2021-09-10 ENCOUNTER — Ambulatory Visit
Admission: RE | Admit: 2021-09-10 | Discharge: 2021-09-10 | Disposition: A | Discharge status: Home / Self Care | Payer: Medicare Other | Source: Ambulatory Visit | Attending: Family Medicine | Admitting: Family Medicine

## 2021-09-10 ENCOUNTER — Other Ambulatory Visit: Payer: Self-pay

## 2021-09-10 DIAGNOSIS — E042 Nontoxic multinodular goiter: Secondary | ICD-10-CM | POA: Insufficient documentation

## 2021-09-14 NOTE — Progress Notes (Signed)
Cardiology Office Note  Date:  09/15/2021   ID:  Felicia Cisneros, DOB [birth year], MRN [id number]  PCP:  [name], PA-C   Chief Complaint  Patient presents with   12 month follow up     "Doing well." Medications reviewed by the patient verbally.     HPI:  Felicia Cisneros is a very pleasant 70 year old woman with long history of  smoking, quit >5 yrs ago Hyperlipidemia , on statin Remote dizziness and numbness on prior evaluation in 2014,  osteoporosis  CT chest 2016:  No significant coronary ca Mild aorta plaque who presents for follow up of her type 2 diabetes, aorta atherosclerosis  Doing well, [place] and broke rib Then went on cruise,  Got covid [place]"  Torn retina Cataract surgery  "Leg grumbling"  Less exercise secondary to above  Husband died 4 yrs ago from lung CA  No chest pain or SOB  CT scan reviewed from 06/2020 Mild aortic atherosclerosis, minimal coronary calcification  Labs reviewed Total chol 131, LDL 54 HBA1C 5.9  EKG personally reviewed by myself on todays visit Shows normal sinus rhythm with rate 86 bpm, no significant ST or T wave changes   Other past medical history reviewed CT chest 2016:  No significant coronary ca Mild diffuse aortic atherosclerosis Seen in the aortic arch, descending aorta   She has osteopenia noted on the DEXA, Hypertension, Type 2 diabetes; hyperlipidemia,   PMH:   has a past medical history of Aortic atherosclerosis (HCC), Atrophic vaginitis, Chest pain, COPD (chronic obstructive pulmonary disease) ([place]), Diabetes mellitus without complication ([place]), Diverticulitis, DNAR (do not attempt resuscitation) (06/09/2017), Essential hypertension, GERD (gastroesophageal reflux disease), History of kidney stones, History of tobacco use, Hyperlipidemia, IFG (impaired fasting glucose), Irregular heart beat (2018), Leukorrhea, not specified as infective, Osteopenia (05/27/2016), Personal history of tobacco use, presenting  hazards to health (08/20/2015), and Pulmonary nodules.  PSH:    Past Surgical History:  Procedure Laterality Date   colonoscopy     COLONOSCOPY WITH PROPOFOL N/A 02/20/2018   Procedure: COLONOSCOPY WITH PROPOFOL;  Surgeon: Lucilla Lame, MD;  Location: [facility];  Service: Endoscopy;  Laterality: N/A;  Diabetic   CYSTOSCOPY WITH BIOPSY N/A 01/17/2017   Procedure: CYSTOSCOPY WITH BIOPSY;  Surgeon: Hollice Espy, MD;  Location: [facility];  Service: Urology;  Laterality: N/A;   POLYPECTOMY  02/20/2018   Procedure: POLYPECTOMY;  Surgeon: Lucilla Lame, MD;  Location: [facility];  Service: Endoscopy;;   TMJ ARTHROPLASTY  1984    Current Outpatient Medications  Medication Sig Dispense Refill   acetaminophen (TYLENOL) 500 MG tablet Take 1,000 mg by mouth every 6 (six) hours as needed (for pain.).     aspirin EC 81 MG tablet Take 81 mg by mouth at bedtime.     BIOTIN PO Take by mouth daily.     Blood Glucose Monitoring Suppl (ONETOUCH VERIO FLEX SYSTEM) w/Device KIT Check blood sugars up to twice daily prn Dx:E11.9 LON:99 1 kit 0   Calcium-Vitamin D-Vitamin K (VIACTIV CALCIUM PLUS D) 650-12.5-40 MG-MCG-MCG CHEW Chew 1 Troche by mouth in the morning and at bedtime.     clobetasol cream (TEMOVATE) 0.05 % Apply to labia twice daily for 3 weeks then twice weekly as directed 60 g 2   Cobalamin Combinations (NEURIVA PLUS PO) Take 1 tablet by mouth daily.     Coenzyme Q10 (COQ10) 100 MG CAPS One by mouth daily     famotidine (PEPCID) 20 MG tablet Take 1 tablet (  20 mg total) by mouth 2 (two) times daily. 180 tablet 3   glucose blood (ONETOUCH VERIO) test strip CHECK FINGERSTICK BLOOD  SUGARS 3 TIMES WEEKLY 100 strip 3   lisinopril (ZESTRIL) 5 MG tablet TAKE 1 TABLET BY MOUTH  DAILY FOR BLOOD PRESSURE 90 tablet 3   Melatonin 2.5 MG CHEW Chew 2.5 mg by mouth daily as needed.     metFORMIN (GLUCOPHAGE-XR) 500 MG 24 hr tablet TAKE 1 TABLET BY MOUTH  DAILY WITH BREAKFAST 90 tablet 3   OneTouch  Delica Lancets 24M MISC 1 application by Does not apply route 3 (three) times a week. Dx:E11.9 LON:99 check BS 3x per week 100 each 5   pseudoephedrine (SUDAFED) 30 MG tablet Take 1 tablet (30 mg total) by mouth every 8 (eight) hours as needed for congestion. No more than 3 tablets/month 30 tablet 0   simvastatin (ZOCOR) 40 MG tablet Take 1 tablet (40 mg total) by mouth at bedtime. 90 tablet 3   Multiple Vitamins-Minerals (CENTRUM SILVER 50+WOMEN) TABS Take 1 tablet by mouth daily. (Patient not taking: Reported on 09/15/2021)     Current Facility-Administered Medications  Medication Dose Route Frequency Provider Last Rate Last Admin   clobetasol ointment (TEMOVATE) 0.05 %   Topical BID Harlin Heys, MD         Allergies:   Patient has no known allergies.   Social History:  The patient  reports that she quit smoking about 11 years ago. Her smoking use included cigarettes. She has a 135.00 pack-year smoking history. She has never used smokeless tobacco. She reports that she does not currently use alcohol. She reports that she does not use drugs.   Family History:   family history includes COPD in her father; Cancer in her maternal grandfather and sister; Diabetes in her brother and mother; Hearing loss in her father and mother; Heart disease in her paternal uncle and paternal uncle; Hyperlipidemia in her maternal aunt, maternal grandmother, and mother; Hypertension in her maternal grandmother and mother; Irregular heart beat in her brother; Stroke in her maternal aunt, maternal grandmother, and mother; Thyroid disease in her sister; Ulcerative colitis in her sister.    Review of Systems: Review of Systems  Constitutional: Negative.   Respiratory: Negative.    Cardiovascular: Negative.   Gastrointestinal: Negative.   Musculoskeletal: Negative.   Neurological: Negative.   Psychiatric/Behavioral: Negative.    All other systems reviewed and are negative.  PHYSICAL EXAM: VS:  BP 124/80  (BP Location: Left Arm, Patient Position: Sitting, Cuff Size: Normal)   Pulse 86   Ht 5' 8"  (1.727 m)   Wt 172 lb (78 kg)   SpO2 97%   BMI 26.15 kg/m  , BMI Body mass index is 26.15 kg/m. Constitutional:  oriented to person, place, and time. No distress.  HENT:  Head: Grossly normal Eyes:  no discharge. No scleral icterus.  Neck: No JVD, no carotid bruits  Cardiovascular: Regular rate and rhythm, no murmurs appreciated Pulmonary/Chest: Clear to auscultation bilaterally, no wheezes or rails Abdominal: Soft.  no distension.  no tenderness.  Musculoskeletal: Normal range of motion Neurological:  normal muscle tone. Coordination normal. No atrophy Skin: Skin warm and dry Psychiatric: normal affect, pleasant  Recent Labs: 08/28/2021: ALT 12; BUN 24; Creat 0.75; Hemoglobin 12.4; Platelets 361; Potassium 4.4; Sodium 140    Lipid Panel Lab Results  Component Value Date   CHOL 119 02/17/2021   HDL 53 02/17/2021   LDLCALC 47 02/17/2021   TRIG  100 02/17/2021      Wt Readings from Last 3 Encounters:  09/15/21 172 lb (78 kg)  08/28/21 173 lb 1.6 oz (78.5 kg)  02/17/21 174 lb 14.4 oz (79.3 kg)     ASSESSMENT AND PLAN:  Hyperlipidemia Cholesterol is at goal on the current lipid regimen. No changes to the medications were made. Stable If leg muscle pain gets worse, trial hold of simvastatin  History of tobacco use Quit 10 years CT scan chest 2021, no issues mild aortic atherosclerosis, minimal coronary calcification if any  Controlled type 2 diabetes mellitus with other circulatory complication, without long-term current use of insulin (HCC)  A1c well controlled, A1C 5.9 Continue exercise  Aortic atherosclerosis (HCC) Lipids at goal   Total encounter time more than 25 minutes  Greater than 50% was spent in counseling and coordination of care with the patient   Orders Placed This Encounter  Procedures   EKG 12-Lead     Signed, Esmond Plants, M.D., Ph.D. 09/15/2021   [place], [place]

## 2021-09-15 ENCOUNTER — Encounter: Payer: Self-pay | Admitting: Cardiovascular Disease

## 2021-09-15 ENCOUNTER — Other Ambulatory Visit: Payer: Self-pay

## 2021-09-15 ENCOUNTER — Telehealth: Payer: Self-pay

## 2021-09-15 ENCOUNTER — Ambulatory Visit: Payer: Medicare Other | Admitting: Cardiovascular Disease

## 2021-09-15 VITALS — BP 124/80 | HR 86 | Ht 68.0 in | Wt 172.0 lb

## 2021-09-15 DIAGNOSIS — I10 Essential (primary) hypertension: Secondary | ICD-10-CM

## 2021-09-15 DIAGNOSIS — E782 Mixed hyperlipidemia: Secondary | ICD-10-CM | POA: Diagnosis not present

## 2021-09-15 DIAGNOSIS — R0789 Other chest pain: Secondary | ICD-10-CM

## 2021-09-15 DIAGNOSIS — Z87891 Personal history of nicotine dependence: Secondary | ICD-10-CM

## 2021-09-15 DIAGNOSIS — E119 Type 2 diabetes mellitus without complications: Secondary | ICD-10-CM | POA: Diagnosis not present

## 2021-09-15 NOTE — Patient Instructions (Addendum)
Medication Instructions:  No changes  If you need a refill on your cardiac medications before your next appointment, please call your pharmacy.   Lab work: No new labs needed  Testing/Procedures: No new testing needed  Follow-Up: At [facility], you and your health needs are our priority.  As part of our continuing mission to provide you with exceptional heart care, we have created designated Provider Care Teams.  These Care Teams include your primary Cardiologist (physician) and Advanced Practice Providers (APPs -  Physician Assistants and Nurse Practitioners) who all work together to provide you with the care you need, when you need it.  You will need a follow up appointment in 12 months  Providers on your designated Care Team:   Christopher Berge, NP Ryan Dunn, PA-C Cadence Furth, PA-C  COVID-19 Vaccine Information can be found at: [email or web address] For questions related to vaccine distribution or appointments, please email [email or web address] or call [phone].   

## 2021-09-15 NOTE — Telephone Encounter (Signed)
Called pt to let her know the reason for her delay to get scheduled for Lung cancer screening. Pt has received phone number to try to get herself scheduled since she has not heard from them.

## 2021-09-25 ENCOUNTER — Other Ambulatory Visit: Payer: Self-pay

## 2021-09-25 ENCOUNTER — Ambulatory Visit
Admission: RE | Admit: 2021-09-25 | Discharge: 2021-09-25 | Disposition: A | Discharge status: Home / Self Care | Payer: Medicare Other | Source: Ambulatory Visit | Attending: Acute Care | Admitting: Acute Care

## 2021-09-25 DIAGNOSIS — Z87891 Personal history of nicotine dependence: Secondary | ICD-10-CM | POA: Insufficient documentation

## 2021-10-02 ENCOUNTER — Other Ambulatory Visit: Payer: Self-pay | Admitting: Acute Care

## 2021-10-02 DIAGNOSIS — Z87891 Personal history of nicotine dependence: Secondary | ICD-10-CM

## 2021-10-06 ENCOUNTER — Ambulatory Visit
Admission: RE | Admit: 2021-10-06 | Discharge: 2021-10-06 | Disposition: A | Discharge status: Home / Self Care | Payer: Medicare Other | Source: Ambulatory Visit | Attending: Family Medicine | Admitting: Family Medicine

## 2021-10-06 ENCOUNTER — Other Ambulatory Visit: Payer: Self-pay

## 2021-10-06 DIAGNOSIS — Z1231 Encounter for screening mammogram for malignant neoplasm of breast: Secondary | ICD-10-CM | POA: Insufficient documentation

## 2021-10-06 DIAGNOSIS — M85852 Other specified disorders of bone density and structure, left thigh: Secondary | ICD-10-CM | POA: Insufficient documentation

## 2021-10-08 ENCOUNTER — Telehealth: Payer: Self-pay

## 2021-10-08 NOTE — Progress Notes (Signed)
Chronic Care Management Pharmacy Assistant   Name: Felicia Cisneros  MRN: [id number] DOB: 04/14/1951  Reason for Encounter: Diabetes Disease State Call   Recent office visits:  09/08/2021 Clemetine Marker, LPN (Clinical Support PCP) for Medicare Wellness Visit- No medication changes noted, Referral for Lung Cancer Screening placed,   Recent consult visits:  09/15/2021 Ida Rogue, MD (Cardiology) for 12 month follow-up- No medication changes noted, EKG 12-Lead order placed, patient instructed to follow-up in 12 months  Hospital visits:  None in previous 6 months  Medications: Outpatient Encounter Medications as of 10/08/2021  Medication Sig Note   acetaminophen (TYLENOL) 500 MG tablet Take 1,000 mg by mouth every 6 (six) hours as needed (for pain.).    aspirin EC 81 MG tablet Take 81 mg by mouth at bedtime.    BIOTIN PO Take by mouth daily.    Blood Glucose Monitoring Suppl (ONETOUCH VERIO FLEX SYSTEM) w/Device KIT Check blood sugars up to twice daily prn Dx:E11.9 LON:99    Calcium-Vitamin D-Vitamin K (VIACTIV CALCIUM PLUS D) 650-12.5-40 MG-MCG-MCG CHEW Chew 1 Troche by mouth in the morning and at bedtime.    clobetasol cream (TEMOVATE) 0.05 % Apply to labia twice daily for 3 weeks then twice weekly as directed    Cobalamin Combinations (NEURIVA PLUS PO) Take 1 tablet by mouth daily.    Coenzyme Q10 (COQ10) 100 MG CAPS One by mouth daily    famotidine (PEPCID) 20 MG tablet Take 1 tablet (20 mg total) by mouth 2 (two) times daily.    glucose blood (ONETOUCH VERIO) test strip CHECK FINGERSTICK BLOOD  SUGARS 3 TIMES WEEKLY    lisinopril (ZESTRIL) 5 MG tablet TAKE 1 TABLET BY MOUTH  DAILY FOR BLOOD PRESSURE    Melatonin 2.5 MG CHEW Chew 2.5 mg by mouth daily as needed. 10/28/2020: prn   metFORMIN (GLUCOPHAGE-XR) 500 MG 24 hr tablet TAKE 1 TABLET BY MOUTH  DAILY WITH BREAKFAST    Multiple Vitamins-Minerals (CENTRUM SILVER 50+WOMEN) TABS Take 1 tablet by mouth daily. (Patient not taking:  Reported on 40/34/7425)    OneTouch Delica Lancets 95G MISC 1 application by Does not apply route 3 (three) times a week. Dx:E11.9 LON:99 check BS 3x per week    pseudoephedrine (SUDAFED) 30 MG tablet Take 1 tablet (30 mg total) by mouth every 8 (eight) hours as needed for congestion. No more than 3 tablets/month 09/08/2021: Rarely    simvastatin (ZOCOR) 40 MG tablet Take 1 tablet (40 mg total) by mouth at bedtime.    Facility-Administered Encounter Medications as of 10/08/2021  Medication   clobetasol ointment (TEMOVATE) 0.05 %   Care Gaps: Dexa Scan  Star Rating Drugs: Simvastatin 40 mg last filled on 09/01/2021 for a 90-Day supply with [facility] Metformin 500 mg last filled on 09/09/2021 for a 90-Day supply with [facility] Lisinopril 5 mg last filled on 08/10/2021 for a 90-Day supply with [facility] reached out to [facility] regarding the dates of patients refills as Epic does not display the correct dates at this time.  Recent Relevant Labs: Lab Results  Component Value Date/Time   HGBA1C 5.9 (H) 08/28/2021 10:08 AM   HGBA1C 5.8 (H) 02/17/2021 08:58 AM   MICROALBUR 0.5 08/25/2020 09:20 AM   MICROALBUR 0.3 09/06/2018 02:11 PM    Kidney Function Lab Results  Component Value Date/Time   CREATININE 0.75 08/28/2021 10:08 AM   CREATININE 0.79 02/17/2021 08:58 AM   GFRNONAA 76 02/17/2021 08:58 AM   GFRAA  88 02/17/2021 08:58 AM    Current antihyperglycemic regimen:  Metformin 500 mg 1 tablet daily  What recent interventions/DTPs have been made to improve glycemic control:  None ID  Have there been any recent hospitalizations or ED visits since last visit with CPP? No  Patient denies hypoglycemic symptoms, including Pale, Sweaty, Shaky, Hungry, Nervous/irritable, and Vision changes  Patient denies hyperglycemic symptoms, including blurry vision, excessive thirst, fatigue, polyuria, and weakness  How often are you checking your blood sugar? once  daily What are your blood sugars ranging?  Fasting: 102-110  During the week, how often does your blood glucose drop below 70? Never  Are you checking your feet daily/regularly? Yes  Adherence Review: Is the patient currently on a STATIN medication? Yes Is the patient currently on ACE/ARB medication? Yes Does the patient have >5 day gap between last estimated fill dates? No  Patient stated she is doing well. She has no concerns or issues at this time. Patient denies any ill symptoms. She reports that her blood pressure is normal and so is her cholesterol. Patient stated that she is taking all her medications as directed, and denies needing any refills on anything.   Patient has a telephone appointment with [name], CPP on 12/09/2021 @0830   Lynann Bologna, CPA/CMA Clinical Pharmacist Assistant Phone: [phone]

## 2021-11-08 ENCOUNTER — Encounter: Payer: Self-pay | Admitting: Family Medicine

## 2021-11-27 ENCOUNTER — Encounter: Payer: Self-pay | Admitting: Family Medicine

## 2021-12-03 ENCOUNTER — Telehealth: Payer: Medicare Other

## 2021-12-04 ENCOUNTER — Ambulatory Visit (INDEPENDENT_AMBULATORY_CARE_PROVIDER_SITE_OTHER): Payer: Medicare Other | Admitting: Internal Medicine

## 2021-12-04 ENCOUNTER — Encounter: Payer: Self-pay | Admitting: Internal Medicine

## 2021-12-04 VITALS — BP 118/66 | HR 94 | Temp 98.3°F | Resp 16 | Ht 68.0 in | Wt 168.4 lb

## 2021-12-04 DIAGNOSIS — I10 Essential (primary) hypertension: Secondary | ICD-10-CM

## 2021-12-04 DIAGNOSIS — E1159 Type 2 diabetes mellitus with other circulatory complications: Secondary | ICD-10-CM

## 2021-12-04 DIAGNOSIS — E782 Mixed hyperlipidemia: Secondary | ICD-10-CM | POA: Diagnosis not present

## 2021-12-04 LAB — LIPID PANEL
Cholesterol: 111 mg/dL (ref <200)
HDL: 57 mg/dL (ref >=50)
LDL Cholesterol (Calc): 40 mg/dL (calc)
Non-HDL Cholesterol (Calc): 54 mg/dL (calc) (ref <130)
Total CHOL/HDL Ratio: 1.9 (calc) (ref <5.0)
Triglycerides: 68 mg/dL (ref <150)

## 2021-12-04 NOTE — Assessment & Plan Note (Signed)
Reviewed outside labs - cholesterol here has been well controlled, she is on Zocor 40. Recheck lipid panel today as she is fasting. Discussed a supplement she wants to take, no concerns. Follow up in May scheduled.

## 2021-12-04 NOTE — Progress Notes (Signed)
Established Patient Office Visit  Subjective:  Patient ID: Felicia Cisneros, female    DOB: [birth year]  Age: 71 y.o. MRN: [id number]  CC:  Chief Complaint  Patient presents with   Follow-up   Hyperlipidemia   Hypertension   Diabetes    HPI Felicia Cisneros presents for follow up on chronic medical conditions. Here to discuss lab results she got through [facility] - cholesterol and triglycerides both high but wasn't fasting at the time.   Hypertension: -Medications: Lisinopril 5 -Patient is compliant with above medications and reports no side effects.  HLD: -Medications: Zocor 40 -Patient is compliant with above medications and reports no side effects.  -Last lipid panel: TC 117, triglycerides >500, HDL 58, LDL not calculated, was not fasting at that time   Diabetes, Type 2: -Last A1c 5.9 11/22 -Medications: Metformin 500 XR  Past Medical History:  Diagnosis Date   Aortic atherosclerosis ([place])    a. 11/2017 noted on Chest CT - Aortic and branch vessel atherosclerosis.   Atrophic vaginitis    Chest pain    a. 06/2013 ETT: Ex time 6:00, Max HR 136 (87%), 7.1 METS. No ECG changes; b. 07/2018 MV: Ex time: 6:42, nl EF, No ischemia.   COPD (chronic obstructive pulmonary disease) ([place])    a. 11/2017 Chest CT: moderate centrilobular emphysema.   Diabetes mellitus without complication ([place])    Diverticulitis    DNAR (do not attempt resuscitation) 06/09/2017   Discussed today, 06/09/2017   Essential hypertension    GERD (gastroesophageal reflux disease)    History of kidney stones    History of tobacco use    Hyperlipidemia    IFG (impaired fasting glucose)    Irregular heart beat 2018   Leukorrhea, not specified as infective    Osteopenia 05/27/2016   August 2017   Personal history of tobacco use, presenting hazards to health 08/20/2015   Pulmonary nodules    a. 11/2017 CT Chest: No change in bilat pulm nodules, including an ant LUL nodule - 5.33m.    Past Surgical History:   Procedure Laterality Date   colonoscopy     COLONOSCOPY WITH PROPOFOL N/A 02/20/2018   Procedure: COLONOSCOPY WITH PROPOFOL;  Surgeon: WLucilla Lame MD;  [facility] [facility]  Service: Endoscopy;  Laterality: N/A;  Diabetic   CYSTOSCOPY WITH BIOPSY N/A 01/17/2017   Procedure: CYSTOSCOPY WITH BIOPSY;  Surgeon: AHollice Espy MD;  Location: [facility];  Service: Urology;  Laterality: N/A;   POLYPECTOMY  02/20/2018   Procedure: POLYPECTOMY;  Surgeon: WLucilla Lame MD;  [place] [facility]  Service: Endoscopy;;   TMJ ARTHROPLASTY  1984    Family History  Problem Relation Age of Onset   Stroke Mother    Hypertension Mother    Hyperlipidemia Mother    Diabetes Mother        pre-diabetic   Hearing loss Mother    Diabetes Brother    Irregular heart beat Brother    COPD Father    Hearing loss Father    Thyroid disease Sister    Stroke Maternal Grandmother    Hyperlipidemia Maternal Grandmother    Hypertension Maternal Grandmother    Cancer Maternal Grandfather        bone   Cancer Sister        cystic fibroid carcinoma   Ulcerative colitis Sister    Heart disease Paternal Uncle    Heart disease Paternal Uncle    Hyperlipidemia Maternal Aunt    Stroke Maternal  Aunt    Breast cancer Neg Hx    Hematuria Neg Hx    Prostate cancer Neg Hx    Renal cancer Neg Hx     Social History   Socioeconomic History   Marital status: Widowed    Spouse name: Charlyne Mom   Number of children: 0   Years of education: some college   Highest education level: 12th grade  Occupational History    Employer: [employer]  Tobacco Use   Smoking status: Former    Packs/day: 3.00    Years: 45.00    Pack years: 135.00    Types: Cigarettes    Quit date: 10/25/2009    Years since quitting: 12.1   Smokeless tobacco: Never   Tobacco comments:    Quit before my husband was diagnosed with small cell cancer.  Vaping Use   Vaping Use: Never used  Substance and Sexual  Activity   Alcohol use: Not Currently    Comment: Rarely   Drug use: No   Sexual activity: Not Currently    Birth control/protection: None  Other Topics Concern   Not on file  Social History Narrative   Not on file   Social Determinants of Health   Financial Resource Strain: Low Risk    Difficulty of Paying Living Expenses: Not hard at all  Food Insecurity: No Food Insecurity   Worried About Charity fundraiser in the Last Year: Never true   [place] in the Last Year: Never true  Transportation Needs: No Transportation Needs   Lack of Transportation (Medical): No   Lack of Transportation (Non-Medical): No  Physical Activity: Insufficiently Active   Days of Exercise per Week: 2 days   Minutes of Exercise per Session: 60 min  Stress: No Stress Concern Present   Feeling of Stress : Not at all  Social Connections: Socially Isolated   Frequency of Communication with Friends and Family: More than three times a week   Frequency of Social Gatherings with Friends and Family: Three times a week   Attends Religious Services: Never   Active Member of Clubs or Organizations: No   Attends Archivist Meetings: Never   Marital Status: Widowed  Human resources officer Violence: Not At Risk   Fear of Current or Ex-Partner: No   Emotionally Abused: No   Physically Abused: No   Sexually Abused: No    Outpatient Medications Prior to Visit  Medication Sig Dispense Refill   acetaminophen (TYLENOL) 500 MG tablet Take 1,000 mg by mouth every 6 (six) hours as needed (for pain.).     aspirin EC 81 MG tablet Take 81 mg by mouth at bedtime.     BIOTIN PO Take by mouth daily.     Blood Glucose Monitoring Suppl (ONETOUCH VERIO FLEX SYSTEM) w/Device KIT Check blood sugars up to twice daily prn Dx:E11.9 LON:99 1 kit 0   Calcium-Vitamin D-Vitamin K (VIACTIV CALCIUM PLUS D) 650-12.5-40 MG-MCG-MCG CHEW Chew 1 Troche by mouth in the morning and at bedtime.     clobetasol cream (TEMOVATE) 0.05 %  Apply to labia twice daily for 3 weeks then twice weekly as directed 60 g 2   Cobalamin Combinations (NEURIVA PLUS PO) Take 1 tablet by mouth daily.     Coenzyme Q10 (COQ10) 100 MG CAPS One by mouth daily     famotidine (PEPCID) 20 MG tablet Take 1 tablet (20 mg total) by mouth 2 (two) times daily. 180 tablet 3  glucose blood (ONETOUCH VERIO) test strip CHECK FINGERSTICK BLOOD  SUGARS 3 TIMES WEEKLY 100 strip 3   lisinopril (ZESTRIL) 5 MG tablet TAKE 1 TABLET BY MOUTH  DAILY FOR BLOOD PRESSURE 90 tablet 3   Melatonin 2.5 MG CHEW Chew 2.5 mg by mouth daily as needed.     metFORMIN (GLUCOPHAGE-XR) 500 MG 24 hr tablet TAKE 1 TABLET BY MOUTH  DAILY WITH BREAKFAST 90 tablet 3   Multiple Vitamins-Minerals (CENTRUM SILVER 50+WOMEN) TABS Take 1 tablet by mouth daily. (Patient not taking: Reported on 09/15/2021)     OneTouch Delica Lancets 74B MISC 1 application by Does not apply route 3 (three) times a week. Dx:E11.9 LON:99 check BS 3x per week 100 each 5   pseudoephedrine (SUDAFED) 30 MG tablet Take 1 tablet (30 mg total) by mouth every 8 (eight) hours as needed for congestion. No more than 3 tablets/month 30 tablet 0   simvastatin (ZOCOR) 40 MG tablet Take 1 tablet (40 mg total) by mouth at bedtime. 90 tablet 3   Facility-Administered Medications Prior to Visit  Medication Dose Route Frequency Provider Last Rate Last Admin   clobetasol ointment (TEMOVATE) 0.05 %   Topical BID Harlin Heys, MD        No Known Allergies  ROS Review of Systems  Constitutional:  Negative for chills and fever.  Eyes:  Negative for visual disturbance.  Respiratory:  Negative for cough and shortness of breath.   Cardiovascular:  Negative for chest pain.  Neurological:  Negative for dizziness and headaches.     Objective:    Physical Exam Constitutional:      Appearance: Normal appearance.  HENT:     Head: Normocephalic and atraumatic.  Eyes:     Conjunctiva/sclera: Conjunctivae normal.   Cardiovascular:     Rate and Rhythm: Normal rate and regular rhythm.  Pulmonary:     Effort: Pulmonary effort is normal.     Breath sounds: Normal breath sounds.  Musculoskeletal:     Right lower leg: No edema.     Left lower leg: No edema.  Skin:    General: Skin is warm and dry.  Neurological:     General: No focal deficit present.     Mental Status: She is alert. Mental status is at baseline.  Psychiatric:        Mood and Affect: Mood normal.        Behavior: Behavior normal.    BP 118/66    Pulse 94    Temp 98.3 F (36.8 C) (Oral)    Resp 16    Ht 5' 8"  (1.727 m)    Wt 168 lb 6.4 oz (76.4 kg)    SpO2 98%    BMI 25.61 kg/m  Wt Readings from Last 3 Encounters:  12/04/21 168 lb 6.4 oz (76.4 kg)  09/25/21 170 lb (77.1 kg)  09/15/21 172 lb (78 kg)     There are no preventive care reminders to display for this patient.  There are no preventive care reminders to display for this patient.  Lab Results  Component Value Date   TSH 2.99 10/03/2019   Lab Results  Component Value Date   WBC 7.4 08/28/2021   HGB 12.4 08/28/2021   HCT 39.2 08/28/2021   MCV 80.7 08/28/2021   PLT 361 08/28/2021   Lab Results  Component Value Date   NA 140 08/28/2021   K 4.4 08/28/2021   CO2 27 08/28/2021   GLUCOSE 108 (H) 08/28/2021  BUN 24 08/28/2021   CREATININE 0.75 08/28/2021   BILITOT 0.5 08/28/2021   ALKPHOS 70 06/05/2019   AST 15 08/28/2021   ALT 12 08/28/2021   PROT 6.8 08/28/2021   ALBUMIN 4.7 06/05/2019   CALCIUM 9.6 08/28/2021   ANIONGAP 9 05/14/2020   EGFR 86 08/28/2021   Lab Results  Component Value Date   CHOL 119 02/17/2021   Lab Results  Component Value Date   HDL 53 02/17/2021   Lab Results  Component Value Date   LDLCALC 47 02/17/2021   Lab Results  Component Value Date   TRIG 100 02/17/2021   Lab Results  Component Value Date   CHOLHDL 2.2 02/17/2021   Lab Results  Component Value Date   HGBA1C 5.9 (H) 08/28/2021      Assessment & Plan:    Problem List Items Addressed This Visit       Cardiovascular and Mediastinum   Essential hypertension    Stable, continue medication.        Endocrine   Type 2 diabetes mellitus, controlled ([place])    Stable, continue Metformin.        Other   Hyperlipidemia - Primary    Reviewed outside labs - cholesterol here has been well controlled, she is on Zocor 40. Recheck lipid panel today as she is fasting. Discussed a supplement she wants to take, no concerns. Follow up in May scheduled.       Relevant Orders   Lipid Profile     Follow-up: Return for follow up already scheduled in May.    Teodora Medici, DO

## 2021-12-04 NOTE — Patient Instructions (Addendum)
It was great seeing you today!  Plan discussed at today's visit: -Blood work ordered today, results will be uploaded to [place].   Follow up DI:[id number] in May  Take care and let us know if you have any questions or concerns prior to your next visit.  Dr. Rosana Berger

## 2021-12-04 NOTE — Assessment & Plan Note (Signed)
Stable, continue Metformin.

## 2021-12-04 NOTE — Assessment & Plan Note (Signed)
Stable, continue medication.

## 2021-12-08 ENCOUNTER — Telehealth: Payer: Self-pay

## 2021-12-08 NOTE — Progress Notes (Signed)
° ° °  Chronic Care Management Pharmacy Assistant   Name: Felicia Cisneros  MRN: [id number] DOB: 1951/07/31  Patient called to be reminded of her telephone appointment with [name], CPP on 12/09/2021 @ 0830.  Patient aware of appointment date, time, and type of appointment (either telephone or in person). Patient aware to have/bring all medications, supplements, blood pressure and/or blood sugar logs to visit.  Questions: Are there any concerns you would like to discuss during your office visit? Nothing she can think of at this time  Are you having any problems obtaining your medications? no  Star Rating Drug: Simvastatin 40 mg last filled on 11/20/2021  for a 90-Day supply with [facility]  Metformin 500 mg last filled on 09/09/2021 for a 90-Day supply with [facility] Lisinopril 5 mg last filled on 11/01/2021 for a 90-Day supply with [facility]  Any gaps in medications fill history? No  Care Gaps: Diabetic Eye Exam  Lynann Bologna, [place] Pharmacist Assistant Phone: [phone]

## 2021-12-09 ENCOUNTER — Ambulatory Visit (INDEPENDENT_AMBULATORY_CARE_PROVIDER_SITE_OTHER): Payer: Medicare Other

## 2021-12-09 NOTE — Patient Instructions (Signed)
Visit Information It was great speaking with you today!  Please let me know if you have any questions about our visit.   Goals Addressed             This Visit's Progress    Monitor and Manage My Blood Sugar-Diabetes Type 2   On track    Timeframe:  Long-Range Goal Priority:  High Start Date:  06/04/2021                            Expected End Date:   06/04/2022                    Follow Up within 90 days   - check blood sugar at prescribed times - check blood sugar if I feel it is too high or too low - take the blood sugar log to all doctor visits    Why is this important?   Checking your blood sugar at home helps to keep it from getting very high or very low.  Writing the results in a diary or log helps the doctor know how to care for you.  Your blood sugar log should have the time, date and the results.  Also, write down the amount of insulin or other medicine that you take.  Other information, like what you ate, exercise done and how you were feeling, will also be helpful.     Notes:         Patient Care Plan: General Pharmacy (Adult)     Problem Identified: Hypertension, Hyperlipidemia, Diabetes, Coronary Artery Disease, GERD, and Osteopenia   Priority: High     Long-Range Goal: Patient-Specific Goal   Start Date: 06/04/2021  Expected End Date: 06/04/2022  This Visit's Progress: On track  Recent Progress: On track  Priority: High  Note:   Current Barriers:  No barriers noted  Pharmacist Clinical Goal(s):  Patient will maintain control of blood pressure as evidenced by BP less than 140/90  maintain control of diabetes as evidenced by A1c less than 7% through collaboration with PharmD and provider.   Interventions: 1:1 collaboration with [name], PA-C regarding development and update of comprehensive plan of care as evidenced by provider attestation and co-signature Inter-disciplinary care team collaboration (see longitudinal plan of care) Comprehensive  medication review performed; medication list updated in electronic medical record  Hypertension (BP goal <140/90) -Controlled -Current treatment: Lisinopril 5 mg daily: Appropriate, Effective, Safe, Accessible -Medications previously tried: NA  -Current home readings: NA -Current dietary habits: Tries to limit sodium intake. Does indulge in sweets and grapes. Enjoys milk or flavored drinks (lemonade with splenda).  -Current exercise habits: Exercises at gym twice weekly, chores around house daily. -Denies hypotensive/hypertensive symptoms -Recommended to continue current medication  Hyperlipidemia: (LDL goal < 70) -Controlled -Current treatment: Simvastatin 40 mg nightly: Appropriate, Effective, Safe, Accessible -Medications previously tried: NA  -Recommended to continue current medication  Diabetes (A1c goal <7%) -Controlled -Current medications: Metformin XR 500 mg daily: Appropriate, Effective, Safe, Accessible   -Medications previously tried: NA  -Current home glucose readings fasting glucose: 98-120 -Recommended to continue current medication  Osteopenia (Goal Prevent bone fractures) -Controlled -Patient is not a candidate for pharmacologic treatment -Current treatment  Calcium + Vitamin D3 2 troche daily  -Medications previously tried: NA  -Patient gets 1-2 servings of dietary calcium most days -Counseled to split calcium troche to twice daily  -Recommended to continue current medication  Patient Goals/Self-Care  Activities Patient will:  - check glucose daily before breakfast, document, and provide at future appointments check blood pressure weekly, document, and provide at future appointments  Follow Up Plan: Telephone follow up appointment with care management team member scheduled for:  12/01/2022 at 8:30 AM     Patient agreed to services and verbal consent obtained.   Patient verbalizes understanding of instructions and care plan provided today and agrees to  view in [place]. Active MyChart status confirmed with patient.    Malva Limes, [place] Pharmacist Practitioner  [facility] [phone]

## 2021-12-09 NOTE — Progress Notes (Signed)
Chronic Care Management Pharmacy Note  12/09/2021 Name:  Felicia Cisneros MRN:  [id number] DOB:  Jun 14, 1951  Summary: Patient presents for CCM follow-up. She is having several concerns including ankle swelling, leg cramping, and painful, itchy urination. She is wearing compression stockings and making sure to elevate her legs at rest. Her leg cramping is worse at night. She is trying to ensure she is drinking enough water throughout the day.   Her blood sugars and blood pressure remain in excellent control.   Recommendations/Changes made from today's visit: Patient will plan to use vaginal cream prescribed by obgyn, otherwise advised to set up actue visit if symptoms do not improve  Patient due for repeat DEXA in October  Plan: CPP follow-up in 6 months   Subjective: Felicia Cisneros is an 71 y.o. year old female who is a primary patient of [name], [place].  The CCM team was consulted for assistance with disease management and care coordination needs.    Engaged with patient by telephone for follow up visit in response to provider referral for pharmacy case management and/or care coordination services.   Consent to Services:  The patient was given information about Chronic Care Management services, agreed to services, and gave verbal consent prior to initiation of services.  Please see initial visit note for detailed documentation.   Patient Care Team: [name], PA-C as PCP - General (Family Medicine) Oneta Rack, MD as Consulting Physician (Dermatology) Minna Merritts, MD as Consulting Physician (Cardiology) Lucilla Lame, MD as Consulting Physician (Gastroenterology) Germaine Pomfret, [facility] as Pharmacist (Pharmacist)  Recent office visits: 12/04/2021: Patient presented to Dr. Rosana Berger for follow-up.   Recent consult visits: 09/15/21: Patient presented to Dr. Candis Musa.   Hospital visits: None in previous 6 months   Objective:  Lab Results  Component Value  Date   CREATININE 0.75 08/28/2021   BUN 24 08/28/2021   GFRNONAA 76 02/17/2021   GFRAA 88 02/17/2021   NA 140 08/28/2021   K 4.4 08/28/2021   CALCIUM 9.6 08/28/2021   CO2 27 08/28/2021   GLUCOSE 108 (H) 08/28/2021    Lab Results  Component Value Date/Time   HGBA1C 5.9 (H) 08/28/2021 10:08 AM   HGBA1C 5.8 (H) 02/17/2021 08:58 AM   MICROALBUR 0.5 08/25/2020 09:20 AM   MICROALBUR 0.3 09/06/2018 02:11 PM    Last diabetic Eye exam:  Lab Results  Component Value Date/Time   HMDIABEYEEXA No Retinopathy 12/08/2020 12:00 AM    Last diabetic Foot exam: No results found for: HMDIABFOOTEX   Lab Results  Component Value Date   CHOL 111 12/04/2021   HDL 57 12/04/2021   LDLCALC 40 12/04/2021   TRIG 68 12/04/2021   CHOLHDL 1.9 12/04/2021    Hepatic Function Latest Ref Rng & Units 08/28/2021 02/17/2021 08/18/2020  Total Protein 6.1 - 8.1 g/dL 6.8 6.5 6.8  Albumin 3.5 - 5.0 g/dL - - -  AST 10 - 35 U/L _0 ALT 6 - 29 U/L _1 Alk Phosphatase 38 - 126 U/L - - -  Total Bilirubin 0.2 - 1.2 mg/dL 0.5 0.4 0.4    Lab Results  Component Value Date/Time   TSH 2.99 10/03/2019 12:00 AM    CBC Latest Ref Rng & Units 08/28/2021 05/14/2020 02/15/2020  WBC 3.8 - 10.8 Thousand/uL 7.4 6.8 6.7  Hemoglobin 11.7 - 15.5 g/dL 12.4 12.1 14.0  Hematocrit 35.0 - 45.0 % 39.2 36.5 42.0  Platelets 140 - 400 Thousand/uL 361  251 229    Lab Results  Component Value Date/Time   VD25OH 52 05/31/2016 08:46 AM    Clinical ASCVD: No  The ASCVD Risk score (Arnett DK, et al., 2019) failed to calculate for the following reasons:   The valid total cholesterol range is 130 to 320 mg/dL    Depression screen [facility] 2/9 12/04/2021 09/08/2021 08/28/2021  Decreased Interest 0 0 0  Down, Depressed, Hopeless 0 1 1  PHQ - 2 Score 0 1 1  Altered sleeping 0 1 0  Tired, decreased energy 0 1 0  Change in appetite 0 0 0  Feeling bad or failure about yourself  0 0 0  Trouble concentrating 0 0 0  Moving slowly or  fidgety/restless 0 0 0  Suicidal thoughts 0 0 0  PHQ-9 Score 0 3 1  Difficult doing work/chores Not difficult at all Not difficult at all Not difficult at all  Some recent data might be hidden    -Last DEXA Scan: 10/06/21   T-Score femoral neck: -1.7  T-Score total hip: -1.9  T-Score lumbar spine: -2.1  T-Score forearm radius: NA  10-year probability of major osteoporotic fracture: NA  10-year probability of hip fracture: NA  Social History   Tobacco Use  Smoking Status Former   Packs/day: 3.00   Years: 45.00   Pack years: 135.00   Types: Cigarettes   Quit date: 10/25/2009   Years since quitting: 12.1  Smokeless Tobacco Never  Tobacco Comments   Quit before my husband was diagnosed with small cell cancer.   BP Readings from Last 3 Encounters:  12/04/21 118/66  09/15/21 124/80  08/28/21 120/76   Pulse Readings from Last 3 Encounters:  12/04/21 94  09/15/21 86  08/28/21 85   Wt Readings from Last 3 Encounters:  12/04/21 168 lb 6.4 oz (76.4 kg)  09/25/21 170 lb (77.1 kg)  09/15/21 172 lb (78 kg)   BMI Readings from Last 3 Encounters:  12/04/21 25.61 kg/m  09/25/21 25.85 kg/m  09/15/21 26.15 kg/m    Assessment/Interventions: Review of patient past medical history, allergies, medications, health status, including review of consultants reports, laboratory and other test data, was performed as part of comprehensive evaluation and provision of chronic care management services.   SDOH:  (Social Determinants of Health) assessments and interventions performed: Yes SDOH Interventions    Flowsheet Row Most Recent Value  SDOH Interventions   Financial Strain Interventions Intervention Not Indicated       SDOH Screenings   Alcohol Screen: Low Risk    Last Alcohol Screening Score (AUDIT): 0  Depression (PHQ2-9): Low Risk    PHQ-2 Score: 0  Financial Resource Strain: Low Risk    Difficulty of Paying Living Expenses: Not hard at all  Food Insecurity: No Food  Insecurity   Worried About Charity fundraiser in the Last Year: Never true   Ran Out of Food in the Last Year: Never true  Housing: Low Risk    Last Housing Risk Score: 0  Physical Activity: Insufficiently Active   Days of Exercise per Week: 2 days   Minutes of Exercise per Session: 60 min  Social Connections: Socially Isolated   Frequency of Communication with Friends and Family: More than three times a week   Frequency of Social Gatherings with Friends and Family: Three times a week   Attends Religious Services: Never   Active Member of Clubs or Organizations: No   Attends Archivist Meetings: Never   Marital  Status: Widowed  Stress: No Stress Concern Present   Feeling of Stress : Not at all  Tobacco Use: Medium Risk   Smoking Tobacco Use: Former   Smokeless Tobacco Use: Never   Passive Exposure: Not on file  Transportation Needs: No Transportation Needs   Lack of Transportation (Medical): No   Lack of Transportation (Non-Medical): No    CCM Care Plan  No Known Allergies  Medications Reviewed Today     Reviewed by Salomon Fick, CMA (Certified Medical Assistant) on 12/04/21 at 1110  Med List Status: <None>   Medication Order Taking? Sig Documenting Provider Last Dose Status Informant  acetaminophen (TYLENOL) 500 MG tablet [id number] Yes Take 1,000 mg by mouth every 6 (six) hours as needed (for pain.). [provider] Taking Active Self  aspirin EC 81 MG tablet [id number] Yes Take 81 mg by mouth at bedtime. [provider] Taking Active Self  BIOTIN PO [id number] Yes Take by mouth daily. [provider] Taking Active   Blood Glucose Monitoring Suppl (ONETOUCH VERIO FLEX SYSTEM) w/Device KIT [id number] Yes Check blood sugars up to twice daily prn Dx:E11.9 LON:99 [name], PA-C Taking Active   Calcium-Vitamin D-Vitamin K (VIACTIV CALCIUM PLUS D) 650-12.5-40 MG-MCG-MCG CHEW [id number] Yes Chew 1 Troche by mouth in the morning and  at bedtime. [provider] Taking Active Self  clobetasol cream (TEMOVATE) 0.05 % [id number] Yes Apply to labia twice daily for 3 weeks then twice weekly as directed Harlin Heys, MD Taking Active   clobetasol ointment (TEMOVATE) 0.05 % [id number]   Harlin Heys, MD  Active   Cobalamin Combinations (NEURIVA PLUS PO) [id number] Yes Take 1 tablet by mouth daily. [provider] Taking Active   Coenzyme Q10 (COQ10) 100 MG CAPS [id number] Yes One by mouth daily Lada, Satira Anis, MD Taking Active   famotidine (PEPCID) 20 MG tablet [id number] Yes Take 1 tablet (20 mg total) by mouth 2 (two) times daily. [name], PA-C Taking Active   glucose blood [facility] VERIO) test strip [id number] Yes CHECK FINGERSTICK BLOOD  SUGARS 3 TIMES WEEKLY [name], PA-C Taking Active   lisinopril (ZESTRIL) 5 MG tablet [id number] Yes TAKE 1 TABLET BY MOUTH  DAILY FOR BLOOD PRESSURE [name], PA-C Taking Active   Melatonin 2.5 MG CHEW [id number] Yes Chew 2.5 mg by mouth daily as needed. [provider] Taking Active            Med Note (JEFFRIES, Glenice Laine Oct 28, 2020  8:32 AM) prn  metFORMIN (GLUCOPHAGE-XR) 500 MG 24 hr tablet [id number] Yes TAKE 1 TABLET BY MOUTH  DAILY WITH BREAKFAST [name], PA-C Taking Active   Multiple Vitamins-Minerals (CENTRUM SILVER 50+WOMEN) TABS [id number] Yes Take 1 tablet by mouth daily. [provider] Taking Active   OneTouch Delica Lancets 87O MISC [id number] Yes 1 application by Does not apply route 3 (three) times a week. Dx:E11.9 LON:99 check BS 3x per week [name], PA-C Taking Active   pseudoephedrine (SUDAFED) 30 MG tablet [id number] Yes Take 1 tablet (30 mg total) by mouth every 8 (eight) hours as needed for congestion. No more than 3 tablets/month Hubbard Hartshorn, FNP Taking Active            Med Note Clemetine Marker D   Tue Sep 08, 2021  9:27 AM) Rarely   simvastatin (ZOCOR) 40 MG tablet [id number] Yes Take 1 tablet (40 mg  total) by mouth at bedtime. [name], PA-C Taking Active  Patient Active Problem List   Diagnosis Date Noted   Retinal detachment with single break, right 12/31/2020   GERD without esophagitis 03/06/2019   Benign neoplasm of sigmoid colon    Lower extremity edema 01/31/2018   Centrilobular emphysema ([place]) 12/02/2016   Pulmonary nodules/lesions, multiple 12/02/2016   Aortic atherosclerosis ([place]) 07/08/2016   Osteopenia 05/27/2016   Abnormality of nail tissue 11/27/2015   Essential hypertension    Type 2 diabetes mellitus, controlled ([place])    Atrophic vaginitis    History of tobacco use    Abnormal EKG 07/13/2013   Hyperlipidemia 07/13/2013    Immunization History  Administered Date(s) Administered   Fluad Quad(high Dose 65+) 07/08/2020   Influenza, High Dose Seasonal PF 06/12/2017, 06/29/2018, 06/13/2019   Influenza,inj,Quad PF,6+ Mos 08/22/2015   Influenza-Unspecified 06/15/2017, 07/08/2021   PFIZER Comirnaty(Gray Top)Covid-19 Tri-Sucrose Vaccine 03/18/2021   PFIZER(Purple Top)SARS-COV-2 Vaccination 12/16/2019, 01/08/2020   PNEUMOCOCCAL CONJUGATE-20 02/18/2021   Pfizer Covid-19 Vaccine Bivalent Booster 89yr & up 07/08/2021   Pneumococcal Conjugate-13 06/09/2017   Pneumococcal Polysaccharide-23 11/27/2015   Td 07/11/2019   Tdap 10/25/2008, 07/11/2019   Zoster Recombinat (Shingrix) 07/11/2019, 12/05/2019   Zoster, Live 02/27/2016    Conditions to be addressed/monitored:  Hypertension, Hyperlipidemia, Diabetes, Coronary Artery Disease, GERD, and Osteopenia  Care Plan : General Pharmacy (Adult)  Updates made by FGermaine Pomfret RPH since 12/09/2021 12:00 AM     Problem: Hypertension, Hyperlipidemia, Diabetes, Coronary Artery Disease, GERD, and Osteopenia   Priority: High     Long-Range Goal: Patient-Specific Goal   Start Date: 06/04/2021  Expected End Date: 06/04/2022  This Visit's Progress: On track  Recent Progress: On track  Priority: High   Note:   Current Barriers:  No barriers noted  Pharmacist Clinical Goal(s):  Patient will maintain control of blood pressure as evidenced by BP less than 140/90  maintain control of diabetes as evidenced by A1c less than 7% through collaboration with PharmD and provider.   Interventions: 1:1 collaboration with TDelsa Grana PA-C regarding development and update of comprehensive plan of care as evidenced by provider attestation and co-signature Inter-disciplinary care team collaboration (see longitudinal plan of care) Comprehensive medication review performed; medication list updated in electronic medical record  Hypertension (BP goal <140/90) -Controlled -Current treatment: Lisinopril 5 mg daily: Appropriate, Effective, Safe, Accessible -Medications previously tried: NA  -Current home readings: NA -Current dietary habits: Tries to limit sodium intake. Does indulge in sweets and grapes. Enjoys milk or flavored drinks (lemonade with splenda).  -Current exercise habits: Exercises at gym twice weekly, chores around house daily. -Denies hypotensive/hypertensive symptoms -Recommended to continue current medication  Hyperlipidemia: (LDL goal < 70) -Controlled -Current treatment: Simvastatin 40 mg nightly: Appropriate, Effective, Safe, Accessible -Medications previously tried: NA  -Recommended to continue current medication  Diabetes (A1c goal <7%) -Controlled -Current medications: Metformin XR 500 mg daily: Appropriate, Effective, Safe, Accessible   -Medications previously tried: NA  -Current home glucose readings fasting glucose: 98-120 -Recommended to continue current medication  Osteopenia (Goal Prevent bone fractures) -Controlled -Patient is not a candidate for pharmacologic treatment -Current treatment  Calcium + Vitamin D3 2 troche daily  -Medications previously tried: NA  -Patient gets 1-2 servings of dietary calcium most days -Counseled to split calcium troche to  twice daily  -Recommended to continue current medication  Patient Goals/Self-Care Activities Patient will:  - check glucose daily before breakfast, document, and provide at future appointments check blood pressure weekly, document, and provide at future appointments  Follow Up Plan: Telephone  follow up appointment with care management team member scheduled for:  12/01/2022 at 8:30 AM        Medication Assistance: None required.  Patient affirms current coverage meets needs.  Compliance/Adherence/Medication fill history: Care Gaps: Influenza vaccine  Star-Rating Drugs: Metformin 500 mg last filled on 03/25/2021 for a 90-Day supply with [facility] Lisinopril 5 mg last filled on 05/15/2021 for a 90-Day supply with [facility] Simvastatin 40 mg last filled on 05/10/2021 for a 90-Day supply with [facility]  Patient's preferred pharmacy is:  [id number] [address]: [phone]: [phone] [facility] ([facility] [place]Phone: [phone]: [phone] [facility] ([facility] ) - [place]Phone: [phone]: [phone] [facility] [address]Phone: [phone]: [phone]  Uses pill box? Yes Pt endorses 100% compliance  We discussed: Current pharmacy is preferred with insurance plan and patient is satisfied with pharmacy services Patient decided to: Continue current medication management strategy  Care Plan and Follow Up Patient Decision:  Patient agrees to Care Plan and Follow-up.  Plan: Telephone follow up appointment with care management team member scheduled for:  12/01/2022 at 8:30 AM   AMalva Limes CNorth [phone]

## 2021-12-22 DIAGNOSIS — E1159 Type 2 diabetes mellitus with other circulatory complications: Secondary | ICD-10-CM

## 2021-12-22 DIAGNOSIS — I10 Essential (primary) hypertension: Secondary | ICD-10-CM

## 2021-12-26 ENCOUNTER — Other Ambulatory Visit: Payer: Self-pay | Admitting: Family Medicine

## 2021-12-26 DIAGNOSIS — I10 Essential (primary) hypertension: Secondary | ICD-10-CM

## 2021-12-26 DIAGNOSIS — E1159 Type 2 diabetes mellitus with other circulatory complications: Secondary | ICD-10-CM

## 2021-12-28 ENCOUNTER — Telehealth: Payer: Self-pay

## 2021-12-28 NOTE — Telephone Encounter (Signed)
Called pt to inform we can not test her unless she hax symptoms and is being seen in gthe office. ?

## 2021-12-28 NOTE — Telephone Encounter (Signed)
Copied from [place] [id number]. Topic: General - Other >> Dec 28, 2021  3:37 PM Pawlus, Brayton Layman A wrote: Reason for CRM: Pt stated she is going on a cruise on March 16th and needs a PCR covid test, please advise.

## 2022-02-07 ENCOUNTER — Other Ambulatory Visit: Payer: Self-pay | Admitting: Family Medicine

## 2022-02-07 DIAGNOSIS — E1159 Type 2 diabetes mellitus with other circulatory complications: Secondary | ICD-10-CM

## 2022-02-25 ENCOUNTER — Encounter: Payer: Self-pay | Admitting: Family Medicine

## 2022-02-25 ENCOUNTER — Ambulatory Visit (INDEPENDENT_AMBULATORY_CARE_PROVIDER_SITE_OTHER): Payer: Medicare Other | Admitting: Family Medicine

## 2022-02-25 VITALS — BP 116/68 | HR 90 | Resp 16 | Ht 68.0 in | Wt 165.0 lb

## 2022-02-25 DIAGNOSIS — E1159 Type 2 diabetes mellitus with other circulatory complications: Secondary | ICD-10-CM | POA: Diagnosis not present

## 2022-02-25 DIAGNOSIS — M79672 Pain in left foot: Secondary | ICD-10-CM

## 2022-02-25 DIAGNOSIS — I10 Essential (primary) hypertension: Secondary | ICD-10-CM

## 2022-02-25 DIAGNOSIS — E611 Iron deficiency: Secondary | ICD-10-CM

## 2022-02-25 DIAGNOSIS — R6889 Other general symptoms and signs: Secondary | ICD-10-CM | POA: Diagnosis not present

## 2022-02-25 DIAGNOSIS — G4700 Insomnia, unspecified: Secondary | ICD-10-CM

## 2022-02-25 DIAGNOSIS — R231 Pallor: Secondary | ICD-10-CM

## 2022-02-25 DIAGNOSIS — M79671 Pain in right foot: Secondary | ICD-10-CM

## 2022-02-25 DIAGNOSIS — I700 Atherosclerosis of aorta: Secondary | ICD-10-CM

## 2022-02-25 DIAGNOSIS — R918 Other nonspecific abnormal finding of lung field: Secondary | ICD-10-CM

## 2022-02-25 DIAGNOSIS — K30 Functional dyspepsia: Secondary | ICD-10-CM

## 2022-02-25 DIAGNOSIS — R300 Dysuria: Secondary | ICD-10-CM

## 2022-02-25 DIAGNOSIS — J432 Centrilobular emphysema: Secondary | ICD-10-CM

## 2022-02-25 DIAGNOSIS — Z1331 Encounter for screening for depression: Secondary | ICD-10-CM

## 2022-02-25 DIAGNOSIS — R634 Abnormal weight loss: Secondary | ICD-10-CM

## 2022-02-25 MED ORDER — PANTOPRAZOLE SODIUM 40 MG PO TBEC: 40.0000 mg | DELAYED_RELEASE_TABLET | Freq: Every day | ORAL | 3 refills | Status: DC

## 2022-02-25 MED ORDER — LISINOPRIL 5 MG PO TABS: 5.0000 mg | ORAL_TABLET | Freq: Every day | ORAL | 3 refills | Status: DC

## 2022-02-25 MED ORDER — FAMOTIDINE 20 MG PO TABS: 20.0000 mg | ORAL_TABLET | Freq: Two times a day (BID) | ORAL | 3 refills | Status: DC | PRN

## 2022-02-25 NOTE — Progress Notes (Signed)
? ?Name: Felicia Cisneros   MRN: [id number]    DOB: July 19, 1951   Date:02/25/2022 ? ?     Progress Note ? ?Chief Complaint  ?Patient presents with  ? Follow-up  ? Arm Pain  ?  L, bicep area, x2 weeks  ? ? ?Subjective:  ? ?Felicia Cisneros is a 71 y.o. female, presents to clinic for  ? ?Most concerned about blood sugars -she is on low-dose metformin and she has very low A1c for a long time however she continues to poke her fingers every morning and will decide what she can or cannot eat based on her morning blood sugars she notes they are normally 90-1 10 and sometimes depending what she eats they are 130s ? ?Feet keeping her awake at night - every night bothersome has to take something like melatonin, tylenol pm, or a topical cream ? ?Left arm pain x 2 weeks-she thinks it may be the tailgate of her cards very difficult and heavy to open and she is little bit sore but this is not the most bothersome thing for her today ? ?DM:   ?Pt managing DM with metformin 500 mg  ?Reports good med compliance ?Pt has no SE from meds. ?Blood sugars 90-105, but lately have been up to 135 ?Denies: Polyuria, polydipsia, vision changes, neuropathy, hypoglycemia ?Recent pertinent labs: ?Lab Results  ?Component Value Date  ? HGBA1C 5.9 (H) 08/28/2021  ? HGBA1C 5.8 (H) 02/17/2021  ? HGBA1C 5.9 (H) 08/18/2020  ? ?Lab Results  ?Component Value Date  ? MICROALBUR 0.5 08/25/2020  ? [place] 40 12/04/2021  ? CREATININE 0.75 08/28/2021  ? ?Standard of care and health maintenance: ?Urine Microalbumin:  n/a ?Foot exam:  due- done today ?DM eye exam:  ?ACEI/ARB:  yes ?Statin:  yes ? ?When she pees it burns - thinks its because she isn't drinking enough water ?Pushed fluids and its a little better  ? ? ?Supplements - burborine, co-Q-10, biotin, viactiv chew, multivitamin, drinks milk  ? ? ?Weight loss decreased appetite: Some indigestion denies dysphagia, change in bowels, melena, abdominal pain ? ?Wt Readings from Last 12 Encounters:  ?02/25/22 165 lb  (74.8 kg)  ?12/04/21 168 lb 6.4 oz (76.4 kg)  ?09/25/21 170 lb (77.1 kg)  ?09/15/21 172 lb (78 kg)  ?08/28/21 173 lb 1.6 oz (78.5 kg)  ?02/17/21 174 lb 14.4 oz (79.3 kg)  ?09/30/20 180 lb 11.2 oz (82 kg)  ?09/16/20 177 lb (80.3 kg)  ?08/19/20 180 lb 12.8 oz (82 kg)  ?08/18/20 179 lb 8 oz (81.4 kg)  ?07/17/20 178 lb (80.7 kg)  ?07/08/20 178 lb (80.7 kg)  ? ?BMI Readings from Last 5 Encounters:  ?02/25/22 25.09 kg/m?  ?12/04/21 25.61 kg/m?  ?09/25/21 25.85 kg/m?  ?09/15/21 26.15 kg/m?  ?08/28/21 26.32 kg/m?  ? ? ?  02/25/2022  ?  9:29 AM 12/04/2021  ? 11:11 AM 09/08/2021  ?  9:34 AM  ?Depression screen PHQ 2/9  ?Decreased Interest 1 0 0  ?Down, Depressed, Hopeless 1 0 1  ?PHQ - 2 Score 2 0 1  ?Altered sleeping 3 0 1  ?Tired, decreased energy 3 0 1  ?Change in appetite 3 0 0  ?Feeling bad or failure about yourself  1 0 0  ?Trouble concentrating 0 0 0  ?Moving slowly or fidgety/restless 0 0 0  ?Suicidal thoughts 0 0 0  ?PHQ-9 Score 12 0 3  ?Difficult doing work/chores  Not difficult at all Not difficult at all  ? ?Misses husband,  tearful - states this is not normal for her ? ?Feels like she's "falling apart" ? ?Hemoglobin  ?Date Value Ref Range Status  ?08/28/2021 12.4 11.7 - 15.5 g/dL Final  ?05/14/2020 12.1 12.0 - 15.0 g/dL Final  ?02/15/2020 14.0 11.7 - 15.5 g/dL Final  ?08/22/2015 14.9 11.1 - 15.9 g/dL Final  ? ?HGB  ?Date Value Ref Range Status  ?07/10/2013 16.1 (H) 12.0 - 16.0 g/dL Final  ? ?Lab Results  ?Component Value Date  ? IRON 49 08/28/2021  ? TIBC 415 08/28/2021  ? FERRITIN 9 (L) 08/28/2021  ? ?No recent anemia, low iron, states she stopped donating blood - doesn't know if there is iron in her daily vitamin ? ? ?Current Outpatient Medications:  ?  acetaminophen (TYLENOL) 500 MG tablet, Take 1,000 mg by mouth every 6 (six) hours as needed (for pain.)., Disp: , Rfl:  ?  aspirin EC 81 MG tablet, Take 81 mg by mouth at bedtime., Disp: , Rfl:  ?  BIOTIN PO, Take by mouth daily., Disp: , Rfl:  ?  Blood Glucose  Monitoring Suppl (ONETOUCH VERIO FLEX SYSTEM) w/Device KIT, Check blood sugars up to twice daily prn Dx:E11.9 LON:99, Disp: 1 kit, Rfl: 0 ?  Calcium-Vitamin D-Vitamin K (VIACTIV CALCIUM PLUS D) 650-12.5-40 MG-MCG-MCG CHEW, Chew 1 Troche by mouth in the morning and at bedtime., Disp: , Rfl:  ?  clobetasol cream (TEMOVATE) 0.05 %, Apply to labia twice daily for 3 weeks then twice weekly as directed, Disp: 60 g, Rfl: 2 ?  Cobalamin Combinations (NEURIVA PLUS PO), Take 1 tablet by mouth daily., Disp: , Rfl:  ?  Coenzyme Q10 (COQ10) 100 MG CAPS, One by mouth daily, Disp: , Rfl:  ?  famotidine (PEPCID) 20 MG tablet, Take 1 tablet (20 mg total) by mouth 2 (two) times daily., Disp: 180 tablet, Rfl: 3 ?  glucose blood (ONETOUCH VERIO) test strip, CHECK FINGERSTICK BLOOD  SUGARS 3 TIMES WEEKLY, Disp: 100 strip, Rfl: 3 ?  lisinopril (ZESTRIL) 5 MG tablet, TAKE 1 TABLET BY MOUTH  DAILY FOR BLOOD PRESSURE, Disp: 90 tablet, Rfl: 3 ?  Melatonin 2.5 MG CHEW, Chew 2.5 mg by mouth daily as needed., Disp: , Rfl:  ?  metFORMIN (GLUCOPHAGE-XR) 500 MG 24 hr tablet, TAKE 1 TABLET BY MOUTH  DAILY WITH BREAKFAST, Disp: 90 tablet, Rfl: 3 ?  Multiple Vitamins-Minerals (CENTRUM SILVER 50+WOMEN) TABS, Take 1 tablet by mouth daily., Disp: , Rfl:  ?  OneTouch Delica Lancets 56L MISC, 1 application by Does not apply route 3 (three) times a week. Dx:E11.9 LON:99 check BS 3x per week, Disp: 100 each, Rfl: 5 ?  pseudoephedrine (SUDAFED) 30 MG tablet, Take 1 tablet (30 mg total) by mouth every 8 (eight) hours as needed for congestion. No more than 3 tablets/month, Disp: 30 tablet, Rfl: 0 ?  simvastatin (ZOCOR) 40 MG tablet, Take 1 tablet (40 mg total) by mouth at bedtime., Disp: 90 tablet, Rfl: 3 ? ?Current Facility-Administered Medications:  ?  clobetasol ointment (TEMOVATE) 0.05 %, , Topical, BID, Amalia Hailey Nyoka Lint, MD ? ?Patient Active Problem List  ? Diagnosis Date Noted  ? Retinal detachment with single break, right 12/31/2020  ? GERD without  esophagitis 03/06/2019  ? Benign neoplasm of sigmoid colon   ? Lower extremity edema 01/31/2018  ? Centrilobular emphysema ([place]) 12/02/2016  ? Pulmonary nodules/lesions, multiple 12/02/2016  ? Aortic atherosclerosis ([place]) 07/08/2016  ? Osteopenia 05/27/2016  ? Abnormality of nail tissue 11/27/2015  ? Essential hypertension   ?  Type 2 diabetes mellitus, controlled ([place])   ? Atrophic vaginitis   ? History of tobacco use   ? Abnormal EKG 07/13/2013  ? Hyperlipidemia 07/13/2013  ? ? ?Past Surgical History:  ?Procedure Laterality Date  ? colonoscopy    ? COLONOSCOPY WITH PROPOFOL N/A 02/20/2018  ? Procedure: COLONOSCOPY WITH PROPOFOL;  Surgeon: Lucilla Lame, MD;  Location: [place];  Service: Endoscopy;  Laterality: N/A;  Diabetic  ? CYSTOSCOPY WITH BIOPSY N/A 01/17/2017  ? Procedure: CYSTOSCOPY WITH BIOPSY;  Surgeon: Hollice Espy, MD;  Location: [facility];  Service: Urology;  Laterality: N/A;  ? POLYPECTOMY  02/20/2018  ? Procedure: POLYPECTOMY;  Surgeon: Lucilla Lame, MD;  Location: [place];  Service: Endoscopy;;  ? TMJ ARTHROPLASTY  1984  ? ? ?Family History  ?Problem Relation Age of Onset  ? Stroke Mother   ? Hypertension Mother   ? Hyperlipidemia Mother   ? Diabetes Mother   ?     pre-diabetic  ? Hearing loss Mother   ? Diabetes Brother   ? Irregular heart beat Brother   ? COPD Father   ? Hearing loss Father   ? Thyroid disease Sister   ? Stroke Maternal Grandmother   ? Hyperlipidemia Maternal Grandmother   ? Hypertension Maternal Grandmother   ? Cancer Maternal Grandfather   ?     bone  ? Cancer Sister   ?     cystic fibroid carcinoma  ? Ulcerative colitis Sister   ? Heart disease Paternal Uncle   ? Heart disease Paternal Uncle   ? Hyperlipidemia Maternal Aunt   ? Stroke Maternal Aunt   ? Breast cancer Neg Hx   ? Hematuria Neg Hx   ? Prostate cancer Neg Hx   ? Renal cancer Neg Hx   ? ? ?Social History  ? ?Tobacco Use  ? Smoking status: Former  ?  Packs/day: 3.00  ?  Years: 45.00  ?  Pack years:  135.00  ?  Types: Cigarettes  ?  Quit date: 10/25/2009  ?  Years since quitting: 12.3  ? Smokeless tobacco: Never  ? Tobacco comments:  ?  Quit before my husband was diagnosed with small cell cancer.  ?Vapin

## 2022-02-25 NOTE — Progress Notes (Deleted)
Name: Felicia Cisneros   MRN: [id number]    DOB: [birth year]   Date:02/25/2022       Progress Note  Subjective:    I connected with  Felicia Cisneros  on 02/25/22 at  9:40 AM EDT by a video enabled telemedicine application and verified that I am speaking with the correct person using two identifiers.  I discussed the limitations of evaluation and management by telemedicine and the availability of in person appointments. The patient expressed understanding and agreed to proceed. Staff also discussed with the patient that there may be a patient responsible charge related to this service. Patient Location: *** Provider Location: *** Additional Individuals present: ***  Chief Complaint  Patient presents with   Follow-up   Arm Pain    L, bicep area, x2 weeks    Felicia Cisneros is a 71 y.o. female, presents for virtual visit for routine follow up on the conditions listed above.  ***  Patient Active Problem List   Diagnosis Date Noted   Retinal detachment with single break, right 12/31/2020   GERD without esophagitis 03/06/2019   Benign neoplasm of sigmoid colon    Lower extremity edema 01/31/2018   Centrilobular emphysema (HCC) 12/02/2016   Pulmonary nodules/lesions, multiple 12/02/2016   Aortic atherosclerosis (HCC) 07/08/2016   Osteopenia 05/27/2016   Abnormality of nail tissue 11/27/2015   Essential hypertension    Type 2 diabetes mellitus, controlled (HCC)    Atrophic vaginitis    History of tobacco use    Abnormal EKG 07/13/2013   Hyperlipidemia 07/13/2013    Current Outpatient Medications:    acetaminophen (TYLENOL) 500 MG tablet, Take 1,000 mg by mouth every 6 (six) hours as needed (for pain.)., Disp: , Rfl:    aspirin EC 81 MG tablet, Take 81 mg by mouth at bedtime., Disp: , Rfl:    BIOTIN PO, Take by mouth daily., Disp: , Rfl:    Blood Glucose Monitoring Suppl (ONETOUCH VERIO FLEX SYSTEM) w/Device KIT, Check blood sugars up to twice daily prn Dx:E11.9 LON:99, Disp: 1  kit, Rfl: 0   Calcium-Vitamin D-Vitamin K (VIACTIV CALCIUM PLUS D) 650-12.5-40 MG-MCG-MCG CHEW, Chew 1 Troche by mouth in the morning and at bedtime., Disp: , Rfl:    clobetasol cream (TEMOVATE) 0.05 %, Apply to labia twice daily for 3 weeks then twice weekly as directed, Disp: 60 g, Rfl: 2   Cobalamin Combinations (NEURIVA PLUS PO), Take 1 tablet by mouth daily., Disp: , Rfl:    Coenzyme Q10 (COQ10) 100 MG CAPS, One by mouth daily, Disp: , Rfl:    famotidine (PEPCID) 20 MG tablet, Take 1 tablet (20 mg total) by mouth 2 (two) times daily., Disp: 180 tablet, Rfl: 3   glucose blood (ONETOUCH VERIO) test strip, CHECK FINGERSTICK BLOOD  SUGARS 3 TIMES WEEKLY, Disp: 100 strip, Rfl: 3   lisinopril (ZESTRIL) 5 MG tablet, TAKE 1 TABLET BY MOUTH  DAILY FOR BLOOD PRESSURE, Disp: 90 tablet, Rfl: 3   Melatonin 2.5 MG CHEW, Chew 2.5 mg by mouth daily as needed., Disp: , Rfl:    metFORMIN (GLUCOPHAGE-XR) 500 MG 24 hr tablet, TAKE 1 TABLET BY MOUTH  DAILY WITH BREAKFAST, Disp: 90 tablet, Rfl: 3   Multiple Vitamins-Minerals (CENTRUM SILVER 50+WOMEN) TABS, Take 1 tablet by mouth daily., Disp: , Rfl:    OneTouch Delica Lancets 30G MISC, 1 application by Does not apply route 3 (three) times a week. Dx:E11.9 LON:99 check BS 3x per week, Disp: 100 each, Rfl: 5  pseudoephedrine (SUDAFED) 30 MG tablet, Take 1 tablet (30 mg total) by mouth every 8 (eight) hours as needed for congestion. No more than 3 tablets/month, Disp: 30 tablet, Rfl: 0   simvastatin (ZOCOR) 40 MG tablet, Take 1 tablet (40 mg total) by mouth at bedtime., Disp: 90 tablet, Rfl: 3  Current Facility-Administered Medications:    clobetasol ointment (TEMOVATE) 0.05 %, , Topical, BID, Logan Bores, Ellsworth Lennox, MD No Known Allergies  Past Surgical History:  Procedure Laterality Date   colonoscopy     COLONOSCOPY WITH PROPOFOL N/A 02/20/2018   Procedure: COLONOSCOPY WITH PROPOFOL;  Surgeon: Midge Minium, MD;  Location: [facility];  Service: Endoscopy;   Laterality: N/A;  Diabetic   CYSTOSCOPY WITH BIOPSY N/A 01/17/2017   Procedure: CYSTOSCOPY WITH BIOPSY;  Surgeon: Vanna Scotland, MD;  Location: [facility];  Service: Urology;  Laterality: N/A;   POLYPECTOMY  02/20/2018   Procedure: POLYPECTOMY;  Surgeon: Midge Minium, MD;  Location: [facility];  Service: Endoscopy;;   TMJ ARTHROPLASTY  1984   Family History  Problem Relation Age of Onset   Stroke Mother    Hypertension Mother    Hyperlipidemia Mother    Diabetes Mother        pre-diabetic   Hearing loss Mother    Diabetes Brother    Irregular heart beat Brother    COPD Father    Hearing loss Father    Thyroid disease Sister    Stroke Maternal Grandmother    Hyperlipidemia Maternal Grandmother    Hypertension Maternal Grandmother    Cancer Maternal Grandfather        bone   Cancer Sister        cystic fibroid carcinoma   Ulcerative colitis Sister    Heart disease Paternal Uncle    Heart disease Paternal Uncle    Hyperlipidemia Maternal Aunt    Stroke Maternal Aunt    Breast cancer Neg Hx    Hematuria Neg Hx    Prostate cancer Neg Hx    Renal cancer Neg Hx    Social History   Socioeconomic History   Marital status: Widowed    Spouse name: Ezzie Dural   Number of children: 0   Years of education: some college   Highest education level: 12th grade  Occupational History    Employer: [employer]  Tobacco Use   Smoking status: Former    Packs/day: 3.00    Years: 45.00    Pack years: 135.00    Types: Cigarettes    Quit date: 10/25/2009    Years since quitting: 12.3   Smokeless tobacco: Never   Tobacco comments:    Quit before my husband was diagnosed with small cell cancer.  Vaping Use   Vaping Use: Never used  Substance and Sexual Activity   Alcohol use: Not Currently    Comment: Rarely   Drug use: No   Sexual activity: Not Currently    Birth control/protection: None  Other Topics Concern   Not on file  Social History Narrative   Not  on file   Social Determinants of Health   Financial Resource Strain: Low Risk    Difficulty of Paying Living Expenses: Not hard at all  Food Insecurity: No Food Insecurity   Worried About Programme researcher, broadcasting/film/video in the Last Year: Never true   Ran Out of Food in the Last Year: Never true  Transportation Needs: No Transportation Needs   Lack of Transportation (Medical): No   Lack  of Transportation (Non-Medical): No  Physical Activity: Insufficiently Active   Days of Exercise per Week: 2 days   Minutes of Exercise per Session: 60 min  Stress: No Stress Concern Present   Feeling of Stress : Not at all  Social Connections: Socially Isolated   Frequency of Communication with Friends and Family: More than three times a week   Frequency of Social Gatherings with Friends and Family: Three times a week   Attends Religious Services: Never   Active Member of Clubs or Organizations: No   Attends Banker Meetings: Never   Marital Status: Widowed  Catering manager Violence: Not At Risk   Fear of Current or Ex-Partner: No   Emotionally Abused: No   Physically Abused: No   Sexually Abused: No    Chart Review Today: ***  Review of Systems    Objective:    Virtual encounter, vitals limited, only able to obtain the following Today's Vitals   02/25/22 0928  BP: 116/68  Pulse: 90  Resp: 16  SpO2: 98%  Weight: 165 lb (74.8 kg)  Height: 5\' 8"  (1.727 m)   Body mass index is 25.09 kg/m. Nursing Note and Vital Signs reviewed.  Physical Exam  PE limited by telephone encounter  No results found for this or any previous visit (from the past 72 hour(s)).  PHQ2/9:    02/25/2022    9:29 AM 12/04/2021   11:11 AM 09/08/2021    9:34 AM 08/28/2021    9:18 AM 08/21/2021   12:31 PM  Depression screen PHQ 2/9  Decreased Interest 1 0 0 0 0  Down, Depressed, Hopeless 1 0 1 1 0  PHQ - 2 Score 2 0 1 1 0  Altered sleeping 3 0 1 0   Tired, decreased energy 3 0 1 0   Change in  appetite 3 0 0 0   Feeling bad or failure about yourself  1 0 0 0   Trouble concentrating 0 0 0 0   Moving slowly or fidgety/restless 0 0 0 0   Suicidal thoughts 0 0 0 0   PHQ-9 Score 12 0 3 1   Difficult doing work/chores  Not difficult at all Not difficult at all Not difficult at all    PHQ-2/9 Result is ***  Fall Risk:    02/25/2022    9:29 AM 12/04/2021   11:10 AM 09/08/2021    9:50 AM 08/28/2021    9:18 AM 08/21/2021   12:31 PM  Fall Risk   Falls in the past year? 1 0 1 1 0  Number falls in past yr: 0 0 0 0 0  Injury with Fall? 1 0 1 1 0  Risk for fall due to : No Fall Risks No Fall Risks History of fall(s) Impaired balance/gait   Follow up Falls prevention discussed Falls prevention discussed Falls prevention discussed Falls prevention discussed Falls evaluation completed     Assessment and Plan:     ICD-10-CM   1. Controlled type 2 diabetes mellitus with other circulatory complication, without long-term current use of insulin (HCC)  E11.59    well controlled on metformin, on ACEI and Statin, no SE or concerns, recheck A1C    2. Essential hypertension  I10    well controlled with lisinopril       I discussed the assessment and treatment plan with the patient. The patient was provided an opportunity to ask questions and all were answered. The patient agreed with the plan and  demonstrated an understanding of the instructions.  The patient was advised to call back or seek an in-person evaluation if the symptoms worsen or if the condition fails to improve as anticipated.  I provided *** minutes of non-face-to-face time during this encounter.  Dollene Primrose, CMA 02/25/22 9:30 AM

## 2022-02-26 ENCOUNTER — Encounter: Payer: Self-pay | Admitting: Family Medicine

## 2022-02-26 LAB — COMPLETE METABOLIC PANEL WITHOUT GFR
AG Ratio: 2 (calc) (ref 1.0–2.5)
ALT: 20 U/L (ref 6–29)
AST: 22 U/L (ref 10–35)
Albumin: 4.5 g/dL (ref 3.6–5.1)
Alkaline phosphatase (APISO): 66 U/L (ref 37–153)
BUN: 19 mg/dL (ref 7–25)
CO2: 26 mmol/L (ref 20–32)
Calcium: 9.9 mg/dL (ref 8.6–10.4)
Chloride: 107 mmol/L (ref 98–110)
Creat: 0.82 mg/dL (ref 0.60–1.00)
Globulin: 2.2 g/dL (ref 1.9–3.7)
Glucose, Bld: 98 mg/dL (ref 65–99)
Potassium: 4.8 mmol/L (ref 3.5–5.3)
Sodium: 143 mmol/L (ref 135–146)
Total Bilirubin: 0.4 mg/dL (ref 0.2–1.2)
Total Protein: 6.7 g/dL (ref 6.1–8.1)
eGFR: 76 mL/min/{1.73_m2}

## 2022-02-26 LAB — URINALYSIS W MICROSCOPIC + REFLEX CULTURE
Bacteria, UA: NONE SEEN /HPF
Bilirubin Urine: NEGATIVE
Glucose, UA: NEGATIVE
Hgb urine dipstick: NEGATIVE
Hyaline Cast: NONE SEEN /LPF
Ketones, ur: NEGATIVE
Leukocyte Esterase: NEGATIVE
Nitrites, Initial: NEGATIVE
Protein, ur: NEGATIVE
Specific Gravity, Urine: 1.017 (ref 1.001–1.035)
Squamous Epithelial / HPF: NONE SEEN /HPF
pH: 7 (ref 5.0–8.0)

## 2022-02-26 LAB — CBC WITH DIFFERENTIAL/PLATELET
Absolute Monocytes: 360 {cells}/uL (ref 200–950)
Basophils Absolute: 40 {cells}/uL (ref 0–200)
Basophils Relative: 0.8 %
Eosinophils Absolute: 160 {cells}/uL (ref 15–500)
Eosinophils Relative: 3.2 %
HCT: 33.8 % — ABNORMAL LOW (ref 35.0–45.0)
Hemoglobin: 10.2 g/dL — ABNORMAL LOW (ref 11.7–15.5)
Lymphs Abs: 1175 {cells}/uL (ref 850–3900)
MCH: 23.3 pg — ABNORMAL LOW (ref 27.0–33.0)
MCHC: 30.2 g/dL — ABNORMAL LOW (ref 32.0–36.0)
MCV: 77.2 fL — ABNORMAL LOW (ref 80.0–100.0)
MPV: 11.4 fL (ref 7.5–12.5)
Monocytes Relative: 7.2 %
Neutro Abs: 3265 {cells}/uL (ref 1500–7800)
Neutrophils Relative %: 65.3 %
Platelets: 277 10*3/uL (ref 140–400)
RBC: 4.38 Million/uL (ref 3.80–5.10)
RDW: 15 % (ref 11.0–15.0)
Total Lymphocyte: 23.5 %
WBC: 5 10*3/uL (ref 3.8–10.8)

## 2022-02-26 LAB — TSH+FREE T4: TSH W/REFLEX TO FT4: 3.02 mIU/L (ref 0.40–4.50)

## 2022-02-26 LAB — HEMOGLOBIN A1C
Hgb A1c MFr Bld: 5.9 %{Hb} — ABNORMAL HIGH
Mean Plasma Glucose: 123 mg/dL
eAG (mmol/L): 6.8 mmol/L

## 2022-02-26 LAB — IRON,TIBC AND FERRITIN PANEL
%SAT: 6 % — ABNORMAL LOW (ref 16–45)
Ferritin: 4 ng/mL — ABNORMAL LOW (ref 16–288)
Iron: 28 ug/dL — ABNORMAL LOW (ref 45–160)
TIBC: 438 ug/dL (ref 250–450)

## 2022-02-26 LAB — NO CULTURE INDICATED

## 2022-03-02 ENCOUNTER — Encounter: Payer: Self-pay | Admitting: Family Medicine

## 2022-03-02 ENCOUNTER — Other Ambulatory Visit: Payer: Self-pay

## 2022-03-02 DIAGNOSIS — E611 Iron deficiency: Secondary | ICD-10-CM

## 2022-03-02 NOTE — Progress Notes (Signed)
Labs ordered.

## 2022-03-04 ENCOUNTER — Encounter: Payer: Self-pay | Admitting: Family Medicine

## 2022-03-05 ENCOUNTER — Encounter: Payer: Self-pay | Admitting: Family Medicine

## 2022-03-08 ENCOUNTER — Ambulatory Visit (INDEPENDENT_AMBULATORY_CARE_PROVIDER_SITE_OTHER): Payer: Medicare Other

## 2022-03-08 ENCOUNTER — Encounter: Payer: Self-pay | Admitting: Family Medicine

## 2022-03-08 DIAGNOSIS — E611 Iron deficiency: Secondary | ICD-10-CM | POA: Diagnosis not present

## 2022-03-08 LAB — CBC WITH DIFFERENTIAL/PLATELET
Absolute Monocytes: 404 cells/uL (ref 200–950)
Basophils Absolute: 38 cells/uL (ref 0–200)
Basophils Relative: 0.8 %
Eosinophils Absolute: 230 cells/uL (ref 15–500)
Eosinophils Relative: 4.9 %
HCT: 34.2 % — ABNORMAL LOW (ref 35.0–45.0)
Hemoglobin: 10.4 g/dL — ABNORMAL LOW (ref 11.7–15.5)
Lymphs Abs: 1241 cells/uL (ref 850–3900)
MCH: 23.6 pg — ABNORMAL LOW (ref 27.0–33.0)
MCHC: 30.4 g/dL — ABNORMAL LOW (ref 32.0–36.0)
MCV: 77.7 fL — ABNORMAL LOW (ref 80.0–100.0)
MPV: 11 fL (ref 7.5–12.5)
Monocytes Relative: 8.6 %
Neutro Abs: 2787 cells/uL (ref 1500–7800)
Neutrophils Relative %: 59.3 %
Platelets: 300 10*3/uL (ref 140–400)
RBC: 4.4 10*6/uL (ref 3.80–5.10)
RDW: 15.5 % — ABNORMAL HIGH (ref 11.0–15.0)
Total Lymphocyte: 26.4 %
WBC: 4.7 10*3/uL (ref 3.8–10.8)

## 2022-03-08 LAB — POC HEMOCCULT BLD/STL (HOME/3-CARD/SCREEN)
Card #2 Fecal Occult Blod, POC: NEGATIVE
Card #3 Fecal Occult Blood, POC: NEGATIVE
Fecal Occult Blood, POC: NEGATIVE

## 2022-03-16 ENCOUNTER — Encounter: Payer: Self-pay | Admitting: Family Medicine

## 2022-03-17 ENCOUNTER — Encounter: Payer: Self-pay | Admitting: Family Medicine

## 2022-03-21 ENCOUNTER — Encounter: Payer: Self-pay | Admitting: Family Medicine

## 2022-04-07 ENCOUNTER — Encounter: Payer: Self-pay | Admitting: Obstetrics and Gynecology

## 2022-04-07 ENCOUNTER — Ambulatory Visit: Payer: Medicare Other | Admitting: Obstetrics and Gynecology

## 2022-04-07 VITALS — BP 104/68 | HR 86 | Ht 68.0 in | Wt 168.2 lb

## 2022-04-07 DIAGNOSIS — N952 Postmenopausal atrophic vaginitis: Secondary | ICD-10-CM | POA: Diagnosis not present

## 2022-04-07 DIAGNOSIS — L900 Lichen sclerosus et atrophicus: Secondary | ICD-10-CM | POA: Diagnosis not present

## 2022-04-07 DIAGNOSIS — Q525 Fusion of labia: Secondary | ICD-10-CM

## 2022-04-07 NOTE — Progress Notes (Signed)
Patient presents today to discuss vaginal itching and discomfort. She states she has been using Nystatin for relief, slight relief throughout the day. Patient has a history of lichen sclerosus and has not been using her clobetasol. No other concerns at this time.

## 2022-04-07 NOTE — Progress Notes (Signed)
HPI:      [name] is a 71 y.o. G0P0000 who LMP was No LMP recorded. Patient is postmenopausal.  Subjective:   She presents today complaining of worsening itching of her vulva.  She reports that she has noticed some change in appearance "down there".  She says that she stopped using her clobetasol many months ago.  She has tried nystatin cream that was given to her from her daughter and this is somewhat effective but not completely effective.  She has been recently tested for UTI and this was found to be negative.    Hx: The following portions of the patient's history were reviewed and updated as appropriate:             She  has a past medical history of Aortic atherosclerosis ([place]), Atrophic vaginitis, Chest pain, COPD (chronic obstructive pulmonary disease) ([place]), Diabetes mellitus without complication ([place]), Diverticulitis, DNAR (do not attempt resuscitation) (06/09/2017), Essential hypertension, GERD (gastroesophageal reflux disease), History of kidney stones, History of tobacco use, Hyperlipidemia, IFG (impaired fasting glucose), Irregular heart beat (2018), Leukorrhea, not specified as infective, Osteopenia (05/27/2016), Personal history of tobacco use, presenting hazards to health (08/20/2015), and Pulmonary nodules. She does not have any pertinent problems on file. She  has a past surgical history that includes colonoscopy; TMJ Arthroplasty (1984); Cystoscopy with biopsy (N/A, 01/17/2017); Colonoscopy with propofol (N/A, 02/20/2018); and polypectomy (02/20/2018). Her family history includes COPD in her father; Cancer in her maternal grandfather and sister; Diabetes in her brother and mother; Hearing loss in her father and mother; Heart disease in her paternal uncle and paternal uncle; Hyperlipidemia in her maternal aunt, maternal grandmother, and mother; Hypertension in her maternal grandmother and mother; Irregular heart beat in her brother; Stroke in her maternal aunt, maternal  grandmother, and mother; Thyroid disease in her sister; Ulcerative colitis in her sister. She  reports that she quit smoking about 12 years ago. Her smoking use included cigarettes. She has a 135.00 pack-year smoking history. She has never used smokeless tobacco. She reports that she does not currently use alcohol. She reports that she does not use drugs. She has a current medication list which includes the following prescription(s): acetaminophen, biotin, onetouch verio flex system, clobetasol cream, cobalamin combinations, lisinopril, melatonin, metformin, centrum silver 97+QBHAL, onetouch delica lancets 93X, pantoprazole, and simvastatin, and the following Facility-Administered Medications: clobetasol ointment. She has No Known Allergies.       Review of Systems:  Review of Systems  Constitutional: Denied constitutional symptoms, night sweats, recent illness, fatigue, fever, insomnia and weight loss.  Eyes: Denied eye symptoms, eye pain, photophobia, vision change and visual disturbance.  Ears/Nose/Throat/Neck: Denied ear, nose, throat or neck symptoms, hearing loss, nasal discharge, sinus congestion and sore throat.  Cardiovascular: Denied cardiovascular symptoms, arrhythmia, chest pain/pressure, edema, exercise intolerance, orthopnea and palpitations.  Respiratory: Denied pulmonary symptoms, asthma, pleuritic pain, productive sputum, cough, dyspnea and wheezing.  Gastrointestinal: Denied, gastro-esophageal reflux, melena, nausea and vomiting.  Genitourinary: See HPI for additional information.  Musculoskeletal: Denied musculoskeletal symptoms, stiffness, swelling, muscle weakness and myalgia.  Dermatologic: Denied dermatology symptoms, rash and scar.  Neurologic: Denied neurology symptoms, dizziness, headache, neck pain and syncope.  Psychiatric: Denied psychiatric symptoms, anxiety and depression.  Endocrine: Denied endocrine symptoms including hot flashes and night sweats.    Meds:   Current Outpatient Medications on File Prior to Visit  Medication Sig Dispense Refill   acetaminophen (TYLENOL) 500 MG tablet Take 1,000 mg by mouth every 6 (six) hours as  needed (for pain.).     BIOTIN PO Take by mouth daily.     Blood Glucose Monitoring Suppl (ONETOUCH VERIO FLEX SYSTEM) w/Device KIT Check blood sugars up to twice daily prn Dx:E11.9 LON:99 1 kit 0   clobetasol cream (TEMOVATE) 0.05 % Apply to labia twice daily for 3 weeks then twice weekly as directed 60 g 2   Cobalamin Combinations (NEURIVA PLUS PO) Take 1 tablet by mouth daily.     lisinopril (ZESTRIL) 5 MG tablet Take 1 tablet (5 mg total) by mouth daily. for blood pressure 90 tablet 3   Melatonin 2.5 MG CHEW Chew 2.5 mg by mouth daily as needed.     metFORMIN (GLUCOPHAGE-XR) 500 MG 24 hr tablet TAKE 1 TABLET BY MOUTH  DAILY WITH BREAKFAST 90 tablet 3   Multiple Vitamins-Minerals (CENTRUM SILVER 50+WOMEN) TABS Take 1 tablet by mouth daily.     OneTouch Delica Lancets 09B MISC 1 application by Does not apply route 3 (three) times a week. Dx:E11.9 LON:99 check BS 3x per week 100 each 5   pantoprazole (PROTONIX) 40 MG tablet Take 1 tablet (40 mg total) by mouth daily before breakfast. 30 tablet 3   simvastatin (ZOCOR) 40 MG tablet Take 1 tablet (40 mg total) by mouth at bedtime. 90 tablet 3   Current Facility-Administered Medications on File Prior to Visit  Medication Dose Route Frequency Provider Last Rate Last Admin   clobetasol ointment (TEMOVATE) 0.05 %   Topical BID Harlin Heys, MD          Objective:     Vitals:   04/07/22 1530  BP: 104/68  Pulse: 86   Filed Weights   04/07/22 1530  Weight: 168 lb 3.2 oz (76.3 kg)              Physical examination   Pelvic:   Vulva: Some clitoral hood fusion and anterior and posterior labial fusion  Vagina: No lesions or abnormalities noted.  Support: Normal pelvic support.  Urethra No masses tenderness or scarring.  Meatus Normal size without  lesions or prolapse.  Cervix:   Anus: Normal exam.  No lesions.  Perineum: Normal exam.  No lesions.   Patient declines internal vaginal exam.          Assessment:    G0P0000 Patient Active Problem List   Diagnosis Date Noted   Retinal detachment with single break, right 12/31/2020   GERD without esophagitis 03/06/2019   Benign neoplasm of sigmoid colon    Centrilobular emphysema ([place]) 12/02/2016   Pulmonary nodules/lesions, multiple 12/02/2016   Aortic atherosclerosis ([place]) 07/08/2016   Osteopenia 05/27/2016   Essential hypertension    Type 2 diabetes mellitus, controlled ([place])    Atrophic vaginitis    History of tobacco use    Hyperlipidemia 07/13/2013     1. Lichen sclerosus et atrophicus   2. Vaginal atrophy   3. Labial fusion     Patient with a history of lichen sclerosus which was under control with use of clobetasol.  Patient stopped using clobetasol and is now had a return to vulvar itching.   Plan:            1.  Recommend discontinuation of nystatin cream.  Begin use of clobetasol again.  Patient instructed in its use.  The long-term nature of lichen sclerosus and its treatment discussed in detail.  Should she fail use of clobetasol would consider use of external vaginal estrogen to try to decrease labial fusion effect.  She will inform us if she has any further difficulty or if clobetasol is not helping. Orders No orders of the defined types were placed in this encounter.   No orders of the defined types were placed in this encounter.     F/U  No follow-ups on file. I spent 22 minutes involved in the care of this patient preparing to see the patient by obtaining and reviewing her medical history (including labs, imaging tests and prior procedures), documenting clinical information in the electronic health record (EHR), counseling and coordinating care plans, writing and sending prescriptions, ordering tests or procedures and in direct communicating with the  patient and medical staff discussing pertinent items from her history and physical exam.  Finis Bud, M.D. 04/07/2022 3:44 PM

## 2022-04-16 ENCOUNTER — Other Ambulatory Visit: Payer: Self-pay | Admitting: Family Medicine

## 2022-04-16 DIAGNOSIS — E782 Mixed hyperlipidemia: Secondary | ICD-10-CM

## 2022-04-16 DIAGNOSIS — I700 Atherosclerosis of aorta: Secondary | ICD-10-CM

## 2022-05-05 ENCOUNTER — Other Ambulatory Visit: Payer: Self-pay | Admitting: Family Medicine

## 2022-05-05 DIAGNOSIS — K30 Functional dyspepsia: Secondary | ICD-10-CM

## 2022-05-26 LAB — HM DIABETES EYE EXAM

## 2022-05-27 ENCOUNTER — Telehealth: Payer: Self-pay | Admitting: Family Medicine

## 2022-05-27 NOTE — Telephone Encounter (Signed)
Copied from [place]. Topic: General - Other >> May 27, 2022 10:38 AM Chapman Fitch wrote: Reason for CRM: pt uses diabetic supplies and doesn't know how to dispose of the used needles / she has wrapped them in duct tape but was advised not to throw away in the trash / she cant find anyone to take them / please advise what to do

## 2022-05-27 NOTE — Telephone Encounter (Signed)
Spoke with patient. I advised her to discard the needles in a container with a lid. She states she has been putting them in an old rx container with a screw top lid. I advised her to either contact her trash pick-up to verify if she can dispose with them or to contact the health department for proper disposal. Pt gave verbal understanding.

## 2022-06-01 ENCOUNTER — Other Ambulatory Visit: Payer: Self-pay | Admitting: Family Medicine

## 2022-06-01 DIAGNOSIS — E119 Type 2 diabetes mellitus without complications: Secondary | ICD-10-CM

## 2022-06-05 ENCOUNTER — Encounter: Payer: Self-pay | Admitting: Family Medicine

## 2022-06-07 ENCOUNTER — Encounter: Payer: Self-pay | Admitting: Family Medicine

## 2022-06-07 ENCOUNTER — Ambulatory Visit (INDEPENDENT_AMBULATORY_CARE_PROVIDER_SITE_OTHER): Payer: Medicare Other | Admitting: Family Medicine

## 2022-06-07 VITALS — BP 108/70 | HR 87 | Temp 97.4°F | Resp 16 | Ht 68.0 in | Wt 164.9 lb

## 2022-06-07 DIAGNOSIS — E1159 Type 2 diabetes mellitus with other circulatory complications: Secondary | ICD-10-CM | POA: Diagnosis not present

## 2022-06-07 DIAGNOSIS — I10 Essential (primary) hypertension: Secondary | ICD-10-CM

## 2022-06-07 DIAGNOSIS — K219 Gastro-esophageal reflux disease without esophagitis: Secondary | ICD-10-CM | POA: Diagnosis not present

## 2022-06-07 DIAGNOSIS — D509 Iron deficiency anemia, unspecified: Secondary | ICD-10-CM

## 2022-06-07 DIAGNOSIS — I700 Atherosclerosis of aorta: Secondary | ICD-10-CM | POA: Diagnosis not present

## 2022-06-07 DIAGNOSIS — J432 Centrilobular emphysema: Secondary | ICD-10-CM

## 2022-06-07 DIAGNOSIS — Z5181 Encounter for therapeutic drug level monitoring: Secondary | ICD-10-CM

## 2022-06-07 DIAGNOSIS — R634 Abnormal weight loss: Secondary | ICD-10-CM

## 2022-06-07 DIAGNOSIS — R600 Localized edema: Secondary | ICD-10-CM

## 2022-06-07 DIAGNOSIS — G2581 Restless legs syndrome: Secondary | ICD-10-CM | POA: Insufficient documentation

## 2022-06-07 DIAGNOSIS — E782 Mixed hyperlipidemia: Secondary | ICD-10-CM | POA: Diagnosis not present

## 2022-06-07 DIAGNOSIS — R918 Other nonspecific abnormal finding of lung field: Secondary | ICD-10-CM

## 2022-06-07 DIAGNOSIS — M8589 Other specified disorders of bone density and structure, multiple sites: Secondary | ICD-10-CM

## 2022-06-07 MED ORDER — SIMVASTATIN 40 MG PO TABS: 40.0000 mg | ORAL_TABLET | Freq: Every day | ORAL | 3 refills | Status: DC

## 2022-06-07 NOTE — Assessment & Plan Note (Signed)
On statin, monitoring 

## 2022-06-07 NOTE — Assessment & Plan Note (Signed)
On statin, tolerating Lab Results  Component Value Date   CHOL 111 12/04/2021   HDL 57 12/04/2021   LDLCALC 40 12/04/2021   TRIG 68 12/04/2021   CHOLHDL 1.9 12/04/2021   Lipids checked earlier this year we will recheck at next office visit

## 2022-06-07 NOTE — Assessment & Plan Note (Signed)
Has been stable and very well controlled She is obsessively checking her blood sugars though she has been advised not to encourage her to stop monitoring her CBGs With weight loss and very well controlled A1c she may be able to stop medications and just managed with diet and monitoring labs 1-2 times a year She continues to adjust her diet based on her morning fasting blood sugars which have explained to her she does not need to do She is on a statin, acei, foot exam completed We will check A1c at next appointment

## 2022-06-07 NOTE — Progress Notes (Signed)
Name: Felicia Cisneros   MRN: [id number]    DOB: 12-Aug-1951   Date:06/07/2022       Progress Note  Chief Complaint  Patient presents with   Follow-up   Diabetes   Weight Loss    unintentional     Subjective:   Felicia Cisneros is a 71 y.o. female, presents to clinic for routine follow up  She is concerned about loosing a few more lbs, she has not changed her diet or increased her calories as previously discussed Wt Readings from Last 5 Encounters:  06/07/22 164 lb 14.4 oz (74.8 kg)  04/07/22 168 lb 3.2 oz (76.3 kg)  02/25/22 165 lb (74.8 kg)  12/04/21 168 lb 6.4 oz (76.4 kg)  09/25/21 170 lb (77.1 kg)   BMI Readings from Last 5 Encounters:  06/07/22 25.07 kg/m  04/07/22 25.57 kg/m  02/25/22 25.09 kg/m  12/04/21 25.61 kg/m  09/25/21 25.85 kg/m   Thyroid screen was normal, she has new anemia but hemoccult was negative  Anemia and iron deficiency -  Hemoglobin  Date Value Ref Range Status  03/08/2022 10.4 (L) 11.7 - 15.5 g/dL Final  02/25/2022 10.2 (L) 11.7 - 15.5 g/dL Final  08/28/2021 12.4 11.7 - 15.5 g/dL Final  05/14/2020 12.1 12.0 - 15.0 g/dL Final  08/22/2015 14.9 11.1 - 15.9 g/dL Final   Lab Results  Component Value Date   IRON 28 (L) 02/25/2022   TIBC 438 02/25/2022   FERRITIN 4 (L) 02/25/2022   DM - possibly prediabetes on 500 mg XR metformin once daily, she check CBGs every morning and adjusts here diet day to day based on morning sugars though she has been encouraged to stop this behavior esp with loosing weight and well controlled DM  HLD Lab Results  Component Value Date   CHOL 111 12/04/2021   HDL 57 12/04/2021   LDLCALC 40 12/04/2021   TRIG 68 12/04/2021   CHOLHDL 1.9 12/04/2021  On simvastatin   Current Outpatient Medications:    acetaminophen (TYLENOL) 500 MG tablet, Take 1,000 mg by mouth every 6 (six) hours as needed (for pain.)., Disp: , Rfl:    BIOTIN PO, Take by mouth daily., Disp: , Rfl:    Blood Glucose Monitoring Suppl  (ONETOUCH VERIO FLEX SYSTEM) w/Device KIT, Check blood sugars up to twice daily prn Dx:E11.9 LON:99, Disp: 1 kit, Rfl: 0   clobetasol cream (TEMOVATE) 0.05 %, Apply to labia twice daily for 3 weeks then twice weekly as directed, Disp: 60 g, Rfl: 2   Cobalamin Combinations (NEURIVA PLUS PO), Take 1 tablet by mouth daily., Disp: , Rfl:    Coenzyme Q10-Vitamin E (QUNOL ULTRA COQ10) 100-150 MG-UNIT CAPS, , Disp: , Rfl:    Influenza vac split quadrivalent PF (FLUZONE HIGH-DOSE) 0.5 ML injection, TO BE ADMINISTERED BY PHARMACIST FOR IMMUNIZATION, Disp: , Rfl:    lisinopril (ZESTRIL) 5 MG tablet, Take 1 tablet (5 mg total) by mouth daily. for blood pressure, Disp: 90 tablet, Rfl: 3   Melatonin 2.5 MG CHEW, Chew 2.5 mg by mouth daily as needed., Disp: , Rfl:    metFORMIN (GLUCOPHAGE-XR) 500 MG 24 hr tablet, TAKE 1 TABLET BY MOUTH  DAILY WITH BREAKFAST, Disp: 90 tablet, Rfl: 3   Multiple Vitamins-Minerals (CENTRUM SILVER 50+WOMEN) TABS, Take 1 tablet by mouth daily., Disp: , Rfl:    OneTouch Delica Lancets 08M MISC, 1 application by Does not apply route 3 (three) times a week. Dx:E11.9 LON:99 check BS 3x per week, Disp: 100  each, Rfl: 5   pantoprazole (PROTONIX) 40 MG tablet, TAKE 1 TABLET BY MOUTH DAILY  BEFORE BREAKFAST, Disp: 100 tablet, Rfl: 2   simvastatin (ZOCOR) 40 MG tablet, TAKE 1 TABLET BY MOUTH AT  BEDTIME, Disp: 90 tablet, Rfl: 0   simvastatin (ZOCOR) 20 MG tablet, , Disp: , Rfl:   Current Facility-Administered Medications:    clobetasol ointment (TEMOVATE) 0.05 %, , Topical, BID, Harlin Heys, MD  Patient Active Problem List   Diagnosis Date Noted   Retinal detachment with single break, right 12/31/2020   GERD without esophagitis 03/06/2019   Benign neoplasm of sigmoid colon    Centrilobular emphysema ([place]) 12/02/2016   Pulmonary nodules/lesions, multiple 12/02/2016   Aortic atherosclerosis ([place]) 07/08/2016   Osteopenia 05/27/2016   Essential hypertension    Type 2 diabetes  mellitus, controlled ([place])    Atrophic vaginitis    History of tobacco use    Hyperlipidemia 07/13/2013    Past Surgical History:  Procedure Laterality Date   colonoscopy     COLONOSCOPY WITH PROPOFOL N/A 02/20/2018   Procedure: COLONOSCOPY WITH PROPOFOL;  Surgeon: Lucilla Lame, MD;  Location: [place] [facility];  Service: Endoscopy;  Laterality: N/A;  Diabetic   CYSTOSCOPY WITH BIOPSY N/A 01/17/2017   Procedure: CYSTOSCOPY WITH BIOPSY;  Surgeon: Hollice Espy, MD;  Location: [facility];  Service: Urology;  Laterality: N/A;   POLYPECTOMY  02/20/2018   Procedure: POLYPECTOMY;  Surgeon: Lucilla Lame, MD;  Location: [place];  Service: Endoscopy;;   TMJ ARTHROPLASTY  1984    Family History  Problem Relation Age of Onset   Stroke Mother    Hypertension Mother    Hyperlipidemia Mother    Diabetes Mother        pre-diabetic   Hearing loss Mother    Diabetes Brother    Irregular heart beat Brother    COPD Father    Hearing loss Father    Thyroid disease Sister    Stroke Maternal Grandmother    Hyperlipidemia Maternal Grandmother    Hypertension Maternal Grandmother    Cancer Maternal Grandfather        bone   Cancer Sister        cystic fibroid carcinoma   Ulcerative colitis Sister    Heart disease Paternal Uncle    Heart disease Paternal Uncle    Hyperlipidemia Maternal Aunt    Stroke Maternal Aunt    Breast cancer Neg Hx    Hematuria Neg Hx    Prostate cancer Neg Hx    Renal cancer Neg Hx     Social History   Tobacco Use   Smoking status: Former    Packs/day: 3.00    Years: 45.00    Total pack years: 135.00    Types: Cigarettes    Quit date: 10/25/2009    Years since quitting: 12.6   Smokeless tobacco: Never   Tobacco comments:    Quit before my husband was diagnosed with small cell cancer.  Vaping Use   Vaping Use: Never used  Substance Use Topics   Alcohol use: Not Currently    Comment: Rarely   Drug use: No     No Known Allergies  Health  Maintenance  Topic Date Due   FOOT EXAM  02/17/2022   INFLUENZA VACCINE  01/23/2023 (Originally 05/25/2022)   HEMOGLOBIN A1C  08/28/2022   MAMMOGRAM  10/06/2022   OPHTHALMOLOGY EXAM  05/27/2023   DEXA SCAN  10/07/2023   COLONOSCOPY (Pts 45-28yr Insurance coverage will  need to be confirmed)  02/21/2028   TETANUS/TDAP  07/10/2029   Pneumonia Vaccine 68+ Years old  Completed   COVID-19 Vaccine  Completed   Hepatitis C Screening  Completed   Zoster Vaccines- Shingrix  Completed   HPV VACCINES  Aged Out    Chart Review Today: I personally reviewed active problem list, medication list, allergies, family history, social history, health maintenance, notes from last encounter, lab results, imaging with the patient/caregiver today.   Review of Systems  Constitutional: Negative.   HENT: Negative.    Eyes: Negative.   Respiratory: Negative.    Cardiovascular: Negative.   Gastrointestinal: Negative.   Endocrine: Negative.   Genitourinary: Negative.   Musculoskeletal: Negative.   Skin: Negative.   Allergic/Immunologic: Negative.   Neurological: Negative.   Hematological: Negative.   Psychiatric/Behavioral: Negative.    All other systems reviewed and are negative.    Objective:   Vitals:   06/07/22 0853  BP: 108/70  Pulse: 87  Resp: 16  Temp: (!) 97.4 F (36.3 C)  TempSrc: Oral  SpO2: 97%  Weight: 164 lb 14.4 oz (74.8 kg)  Height: 5' 8"  (1.727 m)    Body mass index is 25.07 kg/m.  Physical Exam Vitals and nursing note reviewed.  Constitutional:      General: She is not in acute distress.    Appearance: Normal appearance. She is well-developed, well-groomed and normal weight. She is not ill-appearing, toxic-appearing or diaphoretic.     Comments: Elderly female appears stated age  HENT:     Head: Normocephalic and atraumatic.     Right Ear: External ear normal.     Left Ear: External ear normal.     Mouth/Throat:     Mouth: Mucous membranes are moist.     Pharynx:  Oropharynx is clear. No oropharyngeal exudate or posterior oropharyngeal erythema.  Eyes:     General: No scleral icterus.       Right eye: No discharge.        Left eye: No discharge.     Conjunctiva/sclera: Conjunctivae normal.     Pupils: Pupils are equal, round, and reactive to light.  Cardiovascular:     Rate and Rhythm: Normal rate and regular rhythm.     Pulses: Normal pulses.     Heart sounds: Normal heart sounds. No murmur heard.    No friction rub. No gallop.  Pulmonary:     Effort: Pulmonary effort is normal. No respiratory distress.     Breath sounds: Normal breath sounds. No stridor. No wheezing or rales.  Abdominal:     General: Bowel sounds are normal. There is no distension.     Palpations: Abdomen is soft.     Tenderness: There is no abdominal tenderness. There is no guarding.  Musculoskeletal:     Right lower leg: No edema.     Left lower leg: No edema.  Skin:    General: Skin is warm and dry.     Coloration: Skin is not jaundiced or pale.     Findings: No bruising.  Neurological:     Mental Status: She is alert. Mental status is at baseline.     Cranial Nerves: No dysarthria or facial asymmetry.     Coordination: Coordination is intact.     Gait: Gait normal.  Psychiatric:        Attention and Perception: Attention normal.        Mood and Affect: Affect normal. Mood is anxious.  Speech: Speech normal.        Behavior: Behavior normal. Behavior is cooperative.         Assessment & Plan:   Problem List Items Addressed This Visit       Cardiovascular and Mediastinum   Essential hypertension   Relevant Medications   simvastatin (ZOCOR) 40 MG tablet   Other Relevant Orders   COMPLETE METABOLIC PANEL WITH GFR   Aortic atherosclerosis (HCC)    On statin, monitoring      Relevant Medications   simvastatin (ZOCOR) 40 MG tablet   Other Relevant Orders   COMPLETE METABOLIC PANEL WITH GFR     Respiratory   Centrilobular emphysema (HCC)     Currently asymptomatic, she is not on any inhalers and she does annual lung cancer screening CTs      Pulmonary nodules/lesions, multiple    F/up thyroid US annually, no palpable masses, nodules or thyromegaly or thyroid tenderness      Relevant Orders   US THYROID     Digestive   GERD without esophagitis    Patient continues to take Protonix daily and she is not having as much reflux only occasionally with certain food triggers Encouraged her to wean off Protonix and use Pepcid or Tums as needed        Endocrine   Type 2 diabetes mellitus, controlled ([place]) - Primary    Has been stable and very well controlled She is obsessively checking her blood sugars though she has been advised not to encourage her to stop monitoring her CBGs With weight loss and very well controlled A1c she may be able to stop medications and just managed with diet and monitoring labs 1-2 times a year She continues to adjust her diet based on her morning fasting blood sugars which have explained to her she does not need to do She is on a statin, acei, foot exam completed We will check A1c at next appointment      Relevant Medications   simvastatin (ZOCOR) 40 MG tablet   Other Relevant Orders   COMPLETE METABOLIC PANEL WITH GFR     Musculoskeletal and Integument   Osteopenia   Relevant Orders   COMPLETE METABOLIC PANEL WITH GFR     Other   Hyperlipidemia    On statin, tolerating Lab Results  Component Value Date   CHOL 111 12/04/2021   HDL 57 12/04/2021   LDLCALC 40 12/04/2021   TRIG 68 12/04/2021   CHOLHDL 1.9 12/04/2021   Lipids checked earlier this year we will recheck at next office visit      Relevant Medications   simvastatin (ZOCOR) 40 MG tablet   Other Relevant Orders   COMPLETE METABOLIC PANEL WITH GFR   Restless leg syndrome    She complains of sx but will not try any Rx for management. Previously we checked iron/thyroid/B12 She was found to have new anemia and iron deficiency,  she has done about 3 months of iron supplementation can only tolerate about 3 days a week, she has not noted any improvement in her symptoms Will be rechecking cbc/iron panel today Offered meds for her to try instead of tylenol PM      Relevant Orders   Iron, TIBC and Ferritin Panel   Other Visit Diagnoses     Recent unexplained weight loss       keep calorie intake log and bring in to review - needs to maintain adequate caloric intake and pt overly strict with diet, encouraged  ensure/boost   Iron deficiency anemia, unspecified iron deficiency anemia type       Relevant Orders   CBC with Differential/Platelet   Iron, TIBC and Ferritin Panel   Hypocalcemia       recheck   Relevant Orders   COMPLETE METABOLIC PANEL WITH GFR   Encounter for medication monitoring       Relevant Orders   CBC with Differential/Platelet   Iron, TIBC and Ferritin Panel   COMPLETE METABOLIC PANEL WITH GFR   Bilateral lower extremity edema       reticular veins and dependent edema, she managed with compression socks        Return in about 6 months (around 12/08/2022).   [name], PA-C 06/07/22 9:20 AM

## 2022-06-07 NOTE — Addendum Note (Signed)
Addended by: [name] on: 06/07/2022 04:38 PM   Modules accepted: Orders

## 2022-06-07 NOTE — Assessment & Plan Note (Signed)
Currently asymptomatic, she is not on any inhalers and she does annual lung cancer screening CTs

## 2022-06-07 NOTE — Assessment & Plan Note (Signed)
Patient continues to take Protonix daily and she is not having as much reflux only occasionally with certain food triggers Encouraged her to wean off Protonix and use Pepcid or Tums as needed

## 2022-06-07 NOTE — Assessment & Plan Note (Signed)
She complains of sx but will not try any Rx for management. Previously we checked iron/thyroid/B12 She was found to have new anemia and iron deficiency, she has done about 3 months of iron supplementation can only tolerate about 3 days a week, she has not noted any improvement in her symptoms Will be rechecking cbc/iron panel today Offered meds for her to try instead of tylenol PM

## 2022-06-07 NOTE — Assessment & Plan Note (Signed)
F/up thyroid US annually, no palpable masses, nodules or thyromegaly or thyroid tenderness

## 2022-06-08 ENCOUNTER — Encounter: Payer: Self-pay | Admitting: Family Medicine

## 2022-06-08 LAB — CBC WITH DIFFERENTIAL/PLATELET
Absolute Monocytes: 400 cells/uL (ref 200–950)
Basophils Absolute: 49 cells/uL (ref 0–200)
Basophils Relative: 0.9 %
Eosinophils Absolute: 248 cells/uL (ref 15–500)
Eosinophils Relative: 4.6 %
HCT: 37.2 % (ref 35.0–45.0)
Hemoglobin: 11.7 g/dL (ref 11.7–15.5)
Lymphs Abs: 1193 cells/uL (ref 850–3900)
MCH: 25.1 pg — ABNORMAL LOW (ref 27.0–33.0)
MCHC: 31.5 g/dL — ABNORMAL LOW (ref 32.0–36.0)
MCV: 79.8 fL — ABNORMAL LOW (ref 80.0–100.0)
MPV: 10.9 fL (ref 7.5–12.5)
Monocytes Relative: 7.4 %
Neutro Abs: 3510 cells/uL (ref 1500–7800)
Neutrophils Relative %: 65 %
Platelets: 252 10*3/uL (ref 140–400)
RBC: 4.66 10*6/uL (ref 3.80–5.10)
RDW: 16 % — ABNORMAL HIGH (ref 11.0–15.0)
Total Lymphocyte: 22.1 %
WBC: 5.4 10*3/uL (ref 3.8–10.8)

## 2022-06-08 LAB — IRON,TIBC AND FERRITIN PANEL
%SAT: 8 % (calc) — ABNORMAL LOW (ref 16–45)
Ferritin: 6 ng/mL — ABNORMAL LOW (ref 16–288)
Iron: 32 ug/dL — ABNORMAL LOW (ref 45–160)
TIBC: 402 mcg/dL (calc) (ref 250–450)

## 2022-06-08 LAB — COMPLETE METABOLIC PANEL WITH GFR
AG Ratio: 2 (calc) (ref 1.0–2.5)
ALT: 17 U/L (ref 6–29)
AST: 21 U/L (ref 10–35)
Albumin: 4.4 g/dL (ref 3.6–5.1)
Alkaline phosphatase (APISO): 77 U/L (ref 37–153)
BUN: 22 mg/dL (ref 7–25)
CO2: 24 mmol/L (ref 20–32)
Calcium: 10 mg/dL (ref 8.6–10.4)
Chloride: 109 mmol/L (ref 98–110)
Creat: 0.76 mg/dL (ref 0.60–1.00)
Globulin: 2.2 g/dL (calc) (ref 1.9–3.7)
Glucose, Bld: 95 mg/dL (ref 65–99)
Potassium: 5.6 mmol/L — ABNORMAL HIGH (ref 3.5–5.3)
Sodium: 143 mmol/L (ref 135–146)
Total Bilirubin: 0.4 mg/dL (ref 0.2–1.2)
Total Protein: 6.6 g/dL (ref 6.1–8.1)
eGFR: 84 mL/min/{1.73_m2} (ref >=60)

## 2022-06-09 ENCOUNTER — Other Ambulatory Visit: Payer: Self-pay

## 2022-06-09 DIAGNOSIS — E875 Hyperkalemia: Secondary | ICD-10-CM

## 2022-06-11 ENCOUNTER — Encounter: Payer: Self-pay | Admitting: Family Medicine

## 2022-06-15 ENCOUNTER — Encounter: Payer: Self-pay | Admitting: Family Medicine

## 2022-06-16 ENCOUNTER — Ambulatory Visit
Admission: RE | Admit: 2022-06-16 | Discharge: 2022-06-16 | Disposition: A | Discharge status: Home / Self Care | Payer: Medicare Other | Source: Ambulatory Visit | Attending: Family Medicine | Admitting: Family Medicine

## 2022-06-16 ENCOUNTER — Encounter: Payer: Self-pay | Admitting: Family Medicine

## 2022-06-16 DIAGNOSIS — R918 Other nonspecific abnormal finding of lung field: Secondary | ICD-10-CM | POA: Insufficient documentation

## 2022-06-29 LAB — HM DIABETES EYE EXAM

## 2022-08-19 ENCOUNTER — Other Ambulatory Visit: Payer: Self-pay

## 2022-08-19 ENCOUNTER — Emergency Department
Admission: EM | Admit: 2022-08-19 | Discharge: 2022-08-19 | Disposition: A | Discharge status: Home / Self Care | Payer: Medicare Other | Attending: Emergency Medicine | Admitting: Emergency Medicine

## 2022-08-19 DIAGNOSIS — R42 Dizziness and giddiness: Secondary | ICD-10-CM | POA: Diagnosis present

## 2022-08-19 DIAGNOSIS — R002 Palpitations: Secondary | ICD-10-CM | POA: Insufficient documentation

## 2022-08-19 DIAGNOSIS — R202 Paresthesia of skin: Secondary | ICD-10-CM | POA: Insufficient documentation

## 2022-08-19 LAB — COMPREHENSIVE METABOLIC PANEL
ALT: 21 U/L (ref 0–44)
AST: 27 U/L (ref 15–41)
Albumin: 3.6 g/dL (ref 3.5–5.0)
Alkaline Phosphatase: 80 U/L (ref 38–126)
Anion gap: 9 (ref 5–15)
BUN: 19 mg/dL (ref 8–23)
CO2: 22 mmol/L (ref 22–32)
Calcium: 8.7 mg/dL — ABNORMAL LOW (ref 8.9–10.3)
Chloride: 110 mmol/L (ref 98–111)
Creatinine, Ser: 0.69 mg/dL (ref 0.44–1.00)
GFR, Estimated: >60 mL/min (ref >60)
Glucose, Bld: 91 mg/dL (ref 70–99)
Potassium: 4 mmol/L (ref 3.5–5.1)
Sodium: 141 mmol/L (ref 135–145)
Total Bilirubin: 0.4 mg/dL (ref 0.3–1.2)
Total Protein: 6 g/dL — ABNORMAL LOW (ref 6.5–8.1)

## 2022-08-19 LAB — CBC WITH DIFFERENTIAL/PLATELET
Abs Immature Granulocytes: 0.01 10*3/uL (ref 0.00–0.07)
Basophils Absolute: 0.1 10*3/uL (ref 0.0–0.1)
Basophils Relative: 1 %
Eosinophils Absolute: 0.1 10*3/uL (ref 0.0–0.5)
Eosinophils Relative: 3 %
HCT: 39.6 % (ref 36.0–46.0)
Hemoglobin: 12.3 g/dL (ref 12.0–15.0)
Immature Granulocytes: 0 %
Lymphocytes Relative: 30 %
Lymphs Abs: 1.2 10*3/uL (ref 0.7–4.0)
MCH: 26 pg (ref 26.0–34.0)
MCHC: 31.1 g/dL (ref 30.0–36.0)
MCV: 83.7 fL (ref 80.0–100.0)
Monocytes Absolute: 0.4 10*3/uL (ref 0.1–1.0)
Monocytes Relative: 9 %
Neutro Abs: 2.4 10*3/uL (ref 1.7–7.7)
Neutrophils Relative %: 57 %
Platelets: 193 10*3/uL (ref 150–400)
RBC: 4.73 MIL/uL (ref 3.87–5.11)
RDW: 18.2 % — ABNORMAL HIGH (ref 11.5–15.5)
WBC: 4.2 10*3/uL (ref 4.0–10.5)
nRBC: 0 % (ref 0.0–0.2)

## 2022-08-19 LAB — TROPONIN I (HIGH SENSITIVITY)
Troponin I (High Sensitivity): 5 ng/L (ref <18)
Troponin I (High Sensitivity): 5 ng/L (ref <18)

## 2022-08-19 NOTE — ED Provider Notes (Signed)
[facility] Provider Note    Event Date/Time   First MD Initiated Contact with Patient 08/19/22 1009     (approximate)   History   Dizziness (Heart pounding, dizziness, and tingling this morning)   HPI  Felicia Cisneros is a 71 y.o. female states she had an episode of heart pounding with dizziness and tingling in her lips and hands earlier this morning.  She reports it was going on when she got here and has since resolved.  She says she has had this several times in the past and has never been anything.  She says she has had it here in the emergency department before.  I found 2 visits for chest pain and 2020 and 2021 but nothing else.  At this time I want to see her all the symptoms are resolved.  She said it could have been a panic attack she has them fairly frequently.      Physical Exam   Triage Vital Signs: ED Triage Vitals  Enc Vitals Group     BP 08/19/22 0945 123/61     Pulse Rate 08/19/22 0945 79     Resp 08/19/22 0945 14     Temp 08/19/22 0945 97.6 F (36.4 C)     Temp Source 08/19/22 0945 Oral     SpO2 08/19/22 0945 94 %     Weight 08/19/22 0946 175 lb 11.3 oz (79.7 kg)     Height 08/19/22 0946 '5\' 8"'$  (1.727 m)     Head Circumference --      Peak Flow --      Pain Score 08/19/22 0946 0     Pain Loc --      Pain Edu? --      Excl. in [place]? --     Most recent vital signs: Vitals:   08/19/22 0945  BP: 123/61  Pulse: 79  Resp: 14  Temp: 97.6 F (36.4 C)  SpO2: 94%     General: Awake, no distress.  CV:  Good peripheral perfusion.  Heart regular rate and rhythm no audible murmurs Resp:  Normal effort.  Lungs are clear Abd:  No distention.  Soft and nontender    ED Results / Procedures / Treatments   Labs (all labs ordered are listed, but only abnormal results are displayed) Labs Reviewed  CBC WITH DIFFERENTIAL/PLATELET - Abnormal; Notable for the following components:      Result Value   RDW 18.2 (*)    All other  components within normal limits  COMPREHENSIVE METABOLIC PANEL  CBC WITH DIFFERENTIAL/PLATELET  TROPONIN I (HIGH SENSITIVITY)  TROPONIN I (HIGH SENSITIVITY)     EKG  EKG read and interpreted by me shows normal sinus rhythm rate of 77 left axis no acute ST-T wave changes   RADIOLOGY    PROCEDURES:  Critical Care performed:   Procedures   MEDICATIONS ORDERED IN ED: Medications - No data to display   IMPRESSION / MDM / [place] / ED COURSE  I reviewed the triage vital signs and the nursing notes. Patient looks well feels well history sounds like she had a vasovagal episode.  She does have some white blood cells in her urine I discussed her care with Dr. Bernardo Heater we will give her some antibiotics they will reschedule her for lithotripsy next week.  Apparently they are unable to do any further lithotripsy today.  Differential diagnosis includes, but is not limited to, syncope from arrhythmia or MI or CVA  are all possible but unlikely.  Patient's presentation is most consistent with acute complicated illness / injury requiring diagnostic workup.  The patient is on the cardiac monitor to evaluate for evidence of arrhythmia and/or significant heart rate changes.  None were seen      FINAL CLINICAL IMPRESSION(S) / ED DIAGNOSES   Final diagnoses:  Lightheadedness     Rx / DC Orders   ED Discharge Orders     None        Note:  This document was prepared using Dragon voice recognition software and may include unintentional dictation errors.   Nena Polio, MD 08/19/22 1258

## 2022-08-19 NOTE — ED Triage Notes (Signed)
Pt woke up feeling dizzy, tingling, and "like my heart was pounding" and called EMS. Pt currently is not experiencing any of these symptoms. Seen last in ER for same thing. She stated it was from anxiety last time.

## 2022-08-19 NOTE — Discharge Instructions (Signed)
The lab work was all normal.  Everything else looked okay 2.  However she has continued to have capitation's I think it would be a good idea if you want to see a cardiologist and possibly they put you on an event recorder to see if they can catch anything.  Otherwise just follow-up with your primary care doctor later on this week or next week.  Do not hesitate to return if you have any further problems.

## 2022-08-20 ENCOUNTER — Encounter: Payer: Self-pay | Admitting: Cardiovascular Disease

## 2022-08-20 ENCOUNTER — Encounter: Payer: Self-pay | Admitting: Family Medicine

## 2022-08-23 ENCOUNTER — Encounter: Payer: Self-pay | Admitting: Family Medicine

## 2022-08-23 ENCOUNTER — Other Ambulatory Visit: Payer: Self-pay | Admitting: Family Medicine

## 2022-08-23 DIAGNOSIS — Z1231 Encounter for screening mammogram for malignant neoplasm of breast: Secondary | ICD-10-CM

## 2022-08-24 ENCOUNTER — Encounter: Payer: Self-pay | Admitting: Family Medicine

## 2022-08-24 ENCOUNTER — Ambulatory Visit (INDEPENDENT_AMBULATORY_CARE_PROVIDER_SITE_OTHER): Payer: Medicare Other | Admitting: Family Medicine

## 2022-08-24 VITALS — BP 128/80 | HR 91 | Temp 97.9°F | Resp 16 | Ht 68.0 in | Wt 167.0 lb

## 2022-08-24 DIAGNOSIS — R319 Hematuria, unspecified: Secondary | ICD-10-CM | POA: Diagnosis not present

## 2022-08-24 DIAGNOSIS — Z09 Encounter for follow-up examination after completed treatment for conditions other than malignant neoplasm: Secondary | ICD-10-CM

## 2022-08-24 LAB — POCT URINALYSIS DIPSTICK
Bilirubin, UA: NEGATIVE
Glucose, UA: NEGATIVE
Ketones, UA: NEGATIVE
Nitrite, UA: NEGATIVE
Protein, UA: NEGATIVE
Spec Grav, UA: >=1.03 — AB (ref 1.010–1.025)
Urobilinogen, UA: 0.2 E.U./dL
pH, UA: 5 (ref 5.0–8.0)

## 2022-08-24 NOTE — Progress Notes (Unsigned)
Patient ID: Felicia Cisneros, female    DOB: 1951-03-10, 71 y.o.   MRN: [id number]  PCP: [name], PA-C  Chief Complaint  Patient presents with   urine issue    Pt concerned was told at ER she had a UTI in [place]. Pt denies sx    Subjective:   Felicia Cisneros is a 71 y.o. female, presents to clinic with CC of the following:  HPI  Pt presents for f/up after ED visit She went to the ER via ambulance on 08/19/2022 after waking up with palpitations She had negative troponin x2, CBC and CMP were obtained and EKG was done which showed sinus rhythm with a rate of 77 and no significant changes compared to prior EKG She is here today because she was told she had abnormal urine testing and was given an antibiotic in the ER however she states they did not test her urine and she did not receive antibiotic and she is confused about this.  Reviewed her ER visit and there is notation on there about urine testing and the antibiotic for a patient that was due for lithotripsy and to follow-up with urology which is not consistent with this patient's medical history or ER visit -there is likely some charting error there is no urine ordered or resulted from the ER encounter, she denies any urinary symptoms denies frequency urgency dysuria, flank pain -urinalysis was done with CMA prior to me entering the room and it was reviewed with the patient Results for orders placed or performed in visit on 08/24/22  POCT urinalysis dipstick  Result Value Ref Range   Color, UA Yellow    Clarity, UA Cloudy    Glucose, UA Negative Negative   Bilirubin, UA Negative    Ketones, UA Negative    Spec Grav, UA >=1.030 (A) 1.010 - 1.025   Blood, UA large    pH, UA 5.0 5.0 - 8.0   Protein, UA Negative Negative   Urobilinogen, UA 0.2 0.2 or 1.0 E.U./dL   Nitrite, UA Negative    Leukocytes, UA Moderate (2+) (A) Negative   Appearance Yellow    Odor Foul   She notes her urine has been dark from not drinking much  but no other sx   Patient Active Problem List   Diagnosis Date Noted   Restless leg syndrome 06/07/2022   Retinal detachment with single break, right 12/31/2020   GERD without esophagitis 03/06/2019   Benign neoplasm of sigmoid colon    Centrilobular emphysema ([place]) 12/02/2016   Pulmonary nodules/lesions, multiple 12/02/2016   Aortic atherosclerosis ([place]) 07/08/2016   Osteopenia 05/27/2016   Essential hypertension    Type 2 diabetes mellitus, controlled (HCC)    Atrophic vaginitis    History of tobacco use    Hyperlipidemia 07/13/2013      Current Outpatient Medications:    acetaminophen (TYLENOL) 500 MG tablet, Take 1,000 mg by mouth every 6 (six) hours as needed (for pain.)., Disp: , Rfl:    Blood Glucose Monitoring Suppl (ONETOUCH VERIO FLEX SYSTEM) w/Device KIT, Check blood sugars up to twice daily prn Dx:E11.9 LON:99, Disp: 1 kit, Rfl: 0   clobetasol cream (TEMOVATE) 0.05 %, Apply to labia twice daily for 3 weeks then twice weekly as directed, Disp: 60 g, Rfl: 2   Cobalamin Combinations (NEURIVA PLUS PO), Take 1 tablet by mouth daily., Disp: , Rfl:    Coenzyme Q10-Vitamin E (QUNOL ULTRA COQ10) 100-150 MG-UNIT CAPS, , Disp: , Rfl:  Influenza vac split quadrivalent PF (FLUZONE HIGH-DOSE) 0.5 ML injection, TO BE ADMINISTERED BY PHARMACIST FOR IMMUNIZATION, Disp: , Rfl:    lisinopril (ZESTRIL) 5 MG tablet, Take 1 tablet (5 mg total) by mouth daily. for blood pressure, Disp: 90 tablet, Rfl: 3   Melatonin 2.5 MG CHEW, Chew 2.5 mg by mouth daily as needed., Disp: , Rfl:    metFORMIN (GLUCOPHAGE-XR) 500 MG 24 hr tablet, TAKE 1 TABLET BY MOUTH  DAILY WITH BREAKFAST, Disp: 90 tablet, Rfl: 3   Multiple Vitamins-Minerals (CENTRUM SILVER 50+WOMEN) TABS, Take 1 tablet by mouth daily., Disp: , Rfl:    OneTouch Delica Lancets 40J MISC, 1 application by Does not apply route 3 (three) times a week. Dx:E11.9 LON:99 check BS 3x per week, Disp: 100 each, Rfl: 5   pantoprazole (PROTONIX) 40 MG  tablet, TAKE 1 TABLET BY MOUTH DAILY  BEFORE BREAKFAST, Disp: 100 tablet, Rfl: 2   simvastatin (ZOCOR) 40 MG tablet, Take 1 tablet (40 mg total) by mouth at bedtime., Disp: 90 tablet, Rfl: 3  Current Facility-Administered Medications:    clobetasol ointment (TEMOVATE) 0.05 %, , Topical, BID, Amalia Hailey, Nyoka Lint, MD   No Known Allergies   Social History   Tobacco Use   Smoking status: Former    Packs/day: 3.00    Years: 45.00    Total pack years: 135.00    Types: Cigarettes    Quit date: 10/25/2009    Years since quitting: 12.8   Smokeless tobacco: Never   Tobacco comments:    Quit before my husband was diagnosed with small cell cancer.  Vaping Use   Vaping Use: Never used  Substance Use Topics   Alcohol use: Not Currently    Comment: Rarely   Drug use: No      Chart Review Today: I personally reviewed active problem list, medication list, allergies, family history, social history, health maintenance, notes from last encounter, lab results, imaging with the patient/caregiver today.   Review of Systems  Constitutional: Negative.   HENT: Negative.    Eyes: Negative.   Respiratory: Negative.    Cardiovascular: Negative.   Gastrointestinal: Negative.   Endocrine: Negative.   Genitourinary: Negative.   Musculoskeletal: Negative.   Skin: Negative.   Allergic/Immunologic: Negative.   Neurological: Negative.   Hematological: Negative.   Psychiatric/Behavioral: Negative.    All other systems reviewed and are negative.      Objective:   Vitals:   08/24/22 0905  BP: 128/80  Pulse: 91  Resp: 16  Temp: 97.9 F (36.6 C)  TempSrc: Oral  SpO2: 95%  Weight: 167 lb (75.8 kg)  Height: _0  (1.727 m)    Body mass index is 25.39 kg/m.  Physical Exam Vitals and nursing note reviewed.  Constitutional:      General: She is not in acute distress.    Appearance: Normal appearance. She is well-developed, well-groomed and normal weight. She is not ill-appearing,  toxic-appearing or diaphoretic.  HENT:     Head: Normocephalic and atraumatic.     Nose: Nose normal.  Eyes:     General:        Right eye: No discharge.        Left eye: No discharge.     Conjunctiva/sclera: Conjunctivae normal.  Neck:     Trachea: No tracheal deviation.  Cardiovascular:     Rate and Rhythm: Normal rate and regular rhythm.     Pulses: Normal pulses.     Heart sounds: Normal heart sounds.  Pulmonary:     Effort: Pulmonary effort is normal. No respiratory distress.     Breath sounds: Normal breath sounds. No stridor.  Abdominal:     General: Bowel sounds are normal. There is no distension.     Palpations: Abdomen is soft.     Tenderness: There is no abdominal tenderness. There is no right CVA tenderness, left CVA tenderness, guarding or rebound.  Musculoskeletal:        General: Normal range of motion.  Skin:    General: Skin is warm and dry.     Findings: No rash.  Neurological:     Mental Status: She is alert. Mental status is at baseline.     Motor: No abnormal muscle tone.     Coordination: Coordination normal.  Psychiatric:        Mood and Affect: Mood normal.        Behavior: Behavior normal. Behavior is cooperative.      Results for orders placed or performed in visit on 08/24/22  POCT urinalysis dipstick  Result Value Ref Range   Color, UA Yellow    Clarity, UA Cloudy    Glucose, UA Negative Negative   Bilirubin, UA Negative    Ketones, UA Negative    Spec Grav, UA >=1.030 (A) 1.010 - 1.025   Blood, UA large    pH, UA 5.0 5.0 - 8.0   Protein, UA Negative Negative   Urobilinogen, UA 0.2 0.2 or 1.0 E.U./dL   Nitrite, UA Negative    Leukocytes, UA Moderate (2+) (A) Negative   Appearance Yellow    Odor Foul        Assessment & Plan:   1. Encounter for examination following treatment at hospital ED encounter and results reviewed at length with pt today RDW high which is consistent with hx of recent IDA, improved, on iron 3 x a week, due  for f/up in a few months Calcium mildly abnormal w/o other lab abnormality or sx/concerns No urine done - likely a charting mistake - will route chart for review  2. Hematuria, unspecified type  - POCT urinalysis dipstick Incidental finding with unnecessary UA today since there was no prior urine testing done in the ER or abnormalities to follow-up on she is asymptomatic, urine is concentrated, discussed with the patient and encouraged her to push fluids She is not immunocompromised and does not have history of pyelonephritis, kidney stones, recurrent UTI -no further urine testing done today based on her history  Pt is scheduled for next f/up    [name], PA-C 08/24/22 9:37 AM

## 2022-08-26 ENCOUNTER — Encounter: Payer: Self-pay | Admitting: Cardiovascular Disease

## 2022-08-26 ENCOUNTER — Encounter: Payer: Self-pay | Admitting: Family Medicine

## 2022-08-30 ENCOUNTER — Ambulatory Visit: Payer: Self-pay | Admitting: *Deleted

## 2022-08-30 ENCOUNTER — Ambulatory Visit: Payer: Medicare Other | Admitting: Internal Medicine

## 2022-08-30 ENCOUNTER — Encounter: Payer: Self-pay | Admitting: Internal Medicine

## 2022-08-30 VITALS — BP 116/78 | HR 114 | Temp 99.3°F | Resp 18 | Ht 68.0 in | Wt 167.4 lb

## 2022-08-30 DIAGNOSIS — J029 Acute pharyngitis, unspecified: Secondary | ICD-10-CM | POA: Diagnosis not present

## 2022-08-30 DIAGNOSIS — R051 Acute cough: Secondary | ICD-10-CM | POA: Diagnosis not present

## 2022-08-30 DIAGNOSIS — R0981 Nasal congestion: Secondary | ICD-10-CM | POA: Diagnosis not present

## 2022-08-30 LAB — POCT INFLUENZA A/B
Influenza A, POC: NEGATIVE
Influenza B, POC: NEGATIVE

## 2022-08-30 NOTE — Telephone Encounter (Signed)
Message from Scherrie Gerlach sent at 08/30/2022  8:19 AM EST  Summary: advice on poss sinus inf   Pt has cough, slight sore throat, neg covid, blowing out green mucus, taking coricidin hbp.  Would like to know if there is anything else she can do?          Call History   Type Contact Phone/Fax User  08/30/2022 08:16 AM EST Phone (Incoming) Felicia Cisneros, Felicia Cisneros (Self) [phone] Jerilynn Mages) Scherrie Gerlach

## 2022-08-30 NOTE — Telephone Encounter (Signed)
Reason for Disposition . SEVERE coughing spells (e.g., whooping sound after coughing, vomiting after coughing)  Answer Assessment - Initial Assessment Questions 1. ONSET: "When did the cough begin?"      *No Answer* 2. SEVERITY: "How bad is the cough today?"      Chest congestion- cough, sore throat 3. SPUTUM: "Describe the color of your sputum" (none, dry cough; clear, white, yellow, green)     green 4. HEMOPTYSIS: "Are you coughing up any blood?" If so ask: "How much?" (flecks, streaks, tablespoons, etc.)     no 5. DIFFICULTY BREATHING: "Are you having difficulty breathing?" If Yes, ask: "How bad is it?" (e.g., mild, moderate, severe)    - MILD: No SOB at rest, mild SOB with walking, speaks normally in sentences, can lie down, no retractions, pulse < 100.    - MODERATE: SOB at rest, SOB with minimal exertion and prefers to sit, cannot lie down flat, speaks in phrases, mild retractions, audible wheezing, pulse 100-120.    - SEVERE: Very SOB at rest, speaks in single words, struggling to breathe, sitting hunched forward, retractions, pulse > 120      Only when coughing 6. FEVER: "Do you have a fever?" If Yes, ask: "What is your temperature, how was it measured, and when did it start?"     no 7. CARDIAC HISTORY: "Do you have any history of heart disease?" (e.g., heart attack, congestive heart failure)      High blood pressure 8. LUNG HISTORY: "Do you have any history of lung disease?"  (e.g., pulmonary embolus, asthma, emphysema)     no 9. PE RISK FACTORS: "Do you have a history of blood clots?" (or: recent major surgery, recent prolonged travel, bedridden)     no 10. OTHER SYMPTOMS: "Do you have any other symptoms?" (e.g., runny nose, wheezing, chest pain)       Chest and sinus congestion, cough 11. PREGNANCY: "Is there any chance you are pregnant?" "When was your last menstrual period?"       na  Protocols used: Cough - Acute Productive-A-AH

## 2022-08-30 NOTE — Patient Instructions (Signed)
It was great seeing you today!  Plan discussed at today's visit: -Continue Corcidin and Flonase 2 sprays on each side twice a day   Follow up in: as needed or if your symptoms worsen or fail to improve   Take care and let us know if you have any questions or concerns prior to your next visit.  Dr. Rosana Berger

## 2022-08-30 NOTE — Telephone Encounter (Signed)
I have discussed with Kristeen Miss (PCP) per PCP any medical recommendations she needs an appointment. I have tried to call her no answer. I will send her a mychart message.

## 2022-08-30 NOTE — Telephone Encounter (Signed)
Attempted to return pt's call.   Left voicemail to call back to discuss symptoms with a nurse.

## 2022-08-30 NOTE — Progress Notes (Signed)
Acute Office Visit  Subjective:     Patient ID: Felicia Cisneros, female    DOB: Sep 24, 1951, 71 y.o.   MRN: [id number]  Chief Complaint  Patient presents with   URI    Chest congestion, cough and sore throat.  Onset 2 days    HPI Patient is in today for cough, chest congestion and sore throat. Symptoms started 2 days ago. Started with sore throat. Started taking Corcidin hbp, has been feeling a little better.   URI Compliant:  -Fever: no -Cough: yes, productive, green  -Shortness of breath: no -Wheezing: no -Chest tightness: no -Chest congestion: no -Nasal congestion: yes -Runny nose: yes, first clear now green and thick  -Post nasal drip: yes -Sneezing: yes -Sore throat: yes -Sinus pressure: no -Headache: yes - last night but took Pseudophed and tylenol which did help  -Face pain: no -Ear pain: no  -Ear pressure: no   -Vomiting: no -Fatigue: no -Sick contacts: no -Context: better -Relief with OTC cold/cough medications: yes  -Treatments attempted: Coridin, Tylenol    Review of Systems  Constitutional:  Negative for chills and fever.  HENT:  Positive for congestion and sore throat. Negative for ear pain and sinus pain.   Respiratory:  Positive for cough and sputum production. Negative for shortness of breath and wheezing.   Cardiovascular:  Negative for chest pain.  Gastrointestinal:  Negative for abdominal pain, diarrhea, nausea and vomiting.  Musculoskeletal:  Negative for myalgias.        Objective:    BP 116/78   Pulse (!) 114   Temp 99.3 F (37.4 C)   Resp 18   Ht '5\' 8"'$  (1.727 m)   Wt 167 lb 6.4 oz (75.9 kg)   SpO2 96%   BMI 25.45 kg/m  BP Readings from Last 3 Encounters:  08/30/22 116/78  08/24/22 128/80  08/19/22 135/69   Wt Readings from Last 3 Encounters:  08/30/22 167 lb 6.4 oz (75.9 kg)  08/24/22 167 lb (75.8 kg)  08/19/22 175 lb 11.3 oz (79.7 kg)      Physical Exam Constitutional:      Appearance: Normal appearance.  HENT:      Head: Normocephalic and atraumatic.     Right Ear: Tympanic membrane, ear canal and external ear normal.     Left Ear: Tympanic membrane, ear canal and external ear normal.     Nose: Congestion present.     Mouth/Throat:     Mouth: Mucous membranes are moist.     Comments: Mild postnasal drip present Eyes:     Conjunctiva/sclera: Conjunctivae normal.  Cardiovascular:     Rate and Rhythm: Normal rate and regular rhythm.  Pulmonary:     Effort: Pulmonary effort is normal.     Breath sounds: Normal breath sounds.  Lymphadenopathy:     Cervical: No cervical adenopathy.  Skin:    General: Skin is warm and dry.  Neurological:     General: No focal deficit present.     Mental Status: She is alert. Mental status is at baseline.  Psychiatric:        Mood and Affect: Mood normal.        Behavior: Behavior normal.     Results for orders placed or performed in visit on 08/30/22  POCT Influenza A/B  Result Value Ref Range   Influenza A, POC Negative Negative   Influenza B, POC Negative Negative        Assessment & Plan:   1. Acute cough/  Nasal congestion/Sore throat: Is consistent with viral URI, she was tested for flu which was negative and COVID.  Discussed treating symptomatically with steroids, Flonase, both of which the patient declines.  She will continue her Coricidin as needed for congestion.  Otherwise continue to rest and stay well-hydrated.  Follow-up here if symptoms worsen or fail to improve.  - POCT Influenza A/B - Novel Coronavirus, NAA ([facility])  Return if symptoms worsen or fail to improve.  Teodora Medici, DO

## 2022-08-30 NOTE — Telephone Encounter (Signed)
Third attempt at returning pt's call without success.   Per policy this call has been forwarded to [facility] for [employer], PA-C.   See pt's note for reason she called in.

## 2022-08-30 NOTE — Telephone Encounter (Signed)
2nd attempt to contact patient to review sx. No answer, LVMTCB [phone].

## 2022-08-30 NOTE — Telephone Encounter (Signed)
  Chief Complaint: upper respiratory symptoms Symptoms: cough, sore throat, nasal and chest congestion  Frequency:  negative COVID test Pertinent Negatives: Patient denies SOB Disposition: '[]'$ ED /'[]'$ Urgent Care (no appt availability in office) / '[x]'$ Appointment(In office/virtual)/ '[]'$  [facility]/ '[]'$ Home Care/ '[]'$ Refused Recommended Disposition /'[]'$ [facility]/ '[]'$  Follow-up with PCP Additional Notes:

## 2022-08-31 LAB — NOVEL CORONAVIRUS, NAA: SARS-CoV-2, NAA: NOT DETECTED

## 2022-09-01 ENCOUNTER — Encounter: Payer: Self-pay | Admitting: Internal Medicine

## 2022-09-02 ENCOUNTER — Encounter: Payer: Self-pay | Admitting: Family Medicine

## 2022-09-02 ENCOUNTER — Ambulatory Visit: Payer: Medicare Other | Admitting: Family Medicine

## 2022-09-02 VITALS — BP 106/62 | HR 96 | Temp 98.4°F | Resp 16 | Ht 68.0 in | Wt 166.2 lb

## 2022-09-02 DIAGNOSIS — J069 Acute upper respiratory infection, unspecified: Secondary | ICD-10-CM

## 2022-09-02 MED ORDER — LEVOCETIRIZINE DIHYDROCHLORIDE 5 MG PO TABS: 5.0000 mg | ORAL_TABLET | Freq: Every evening | ORAL | 0 refills | Status: DC

## 2022-09-02 MED ORDER — BENZONATATE 100 MG PO CAPS: 100.0000 mg | ORAL_CAPSULE | Freq: Three times a day (TID) | ORAL | 0 refills | Status: DC | PRN

## 2022-09-02 NOTE — Progress Notes (Signed)
Patient ID: [name], female    DOB: 09-18-1951, 71 y.o.   MRN: [id number]  PCP: [name], PA-C  Chief Complaint  Patient presents with   Follow-up    Not feeling better all, sx are still the same    Subjective:   EVALINA TABAK is a 71 y.o. female, presents to clinic with CC of the following:  HPI  Pt presents with URI sx, seen two days ago for the same: Pt not improved Chief Complaint  Patient presents with   URI      Chest congestion, cough and sore throat.  Onset 2 days      HPI Patient is in today for cough, chest congestion and sore throat. Symptoms started 2 days ago. Started with sore throat. Started taking Corcidin hbp, has been feeling a little better.    URI Compliant:  -Fever: no -Cough: yes, productive, green  -Shortness of breath: no -Wheezing: no -Chest tightness: no -Chest congestion: no -Nasal congestion: yes -Runny nose: yes, first clear now green and thick  -Post nasal drip: yes -Sneezing: yes -Sore throat: yes -Sinus pressure: no -Headache: yes - last night but took Pseudophed and tylenol which did help  -Face pain: no -Ear pain: no  -Ear pressure: no   -Vomiting: no -Fatigue: no -Sick contacts: no -Context: better -Relief with OTC cold/cough medications: yes  -Treatments attempted: Coridin, Tylenol      Review of Systems  Constitutional:  Negative for chills and fever.  HENT:  Positive for congestion and sore throat. Negative for ear pain and sinus pain.   Respiratory:  Positive for cough and sputum production. Negative for shortness of breath and wheezing.   Cardiovascular:  Negative for chest pain.  Gastrointestinal:  Negative for abdominal pain, diarrhea, nausea and vomiting.  Musculoskeletal:  Negative for myalgias.   1. Acute cough/ Nasal congestion/Sore throat: Is consistent with viral URI, she was tested for flu which was negative and COVID.  Discussed treating symptomatically with steroids, Flonase, both of  which the patient declines.  She will continue her Coricidin as needed for congestion.  Otherwise continue to rest and stay well-hydrated.  Follow-up here if symptoms worsen or fail to improve.   - POCT Influenza A/B - Novel Coronavirus, NAA ([facility])   Return if symptoms worsen or fail to improve.  Patient Active Problem List   Diagnosis Date Noted   Restless leg syndrome 06/07/2022   Retinal detachment with single break, right 12/31/2020   GERD without esophagitis 03/06/2019   Benign neoplasm of sigmoid colon    Centrilobular emphysema ([place]) 12/02/2016   Pulmonary nodules/lesions, multiple 12/02/2016   Aortic atherosclerosis ([place]) 07/08/2016   Osteopenia 05/27/2016   Essential hypertension    Type 2 diabetes mellitus, controlled ([facility])    Atrophic vaginitis    History of tobacco use    Hyperlipidemia 07/13/2013      Current Outpatient Medications:    acetaminophen (TYLENOL) 500 MG tablet, Take 1,000 mg by mouth every 6 (six) hours as needed (for pain.)., Disp: , Rfl:    Blood Glucose Monitoring Suppl (ONETOUCH VERIO FLEX SYSTEM) w/Device KIT, Check blood sugars up to twice daily prn Dx:E11.9 LON:99, Disp: 1 kit, Rfl: 0   clobetasol cream (TEMOVATE) 0.05 %, Apply to labia twice daily for 3 weeks then twice weekly as directed, Disp: 60 g, Rfl: 2   Cobalamin Combinations (NEURIVA PLUS PO), Take 1 tablet by mouth daily., Disp: , Rfl:    Coenzyme Q10-Vitamin E (  QUNOL ULTRA COQ10) 100-150 MG-UNIT CAPS, , Disp: , Rfl:    Influenza vac split quadrivalent PF (FLUZONE HIGH-DOSE) 0.5 ML injection, TO BE ADMINISTERED BY PHARMACIST FOR IMMUNIZATION, Disp: , Rfl:    lisinopril (ZESTRIL) 5 MG tablet, Take 1 tablet (5 mg total) by mouth daily. for blood pressure, Disp: 90 tablet, Rfl: 3   Melatonin 2.5 MG CHEW, Chew 2.5 mg by mouth daily as needed., Disp: , Rfl:    metFORMIN (GLUCOPHAGE-XR) 500 MG 24 hr tablet, TAKE 1 TABLET BY MOUTH  DAILY WITH BREAKFAST, Disp: 90 tablet, Rfl: 3   Multiple  Vitamins-Minerals (CENTRUM SILVER 50+WOMEN) TABS, Take 1 tablet by mouth daily., Disp: , Rfl:    OneTouch Delica Lancets 16X MISC, 1 application by Does not apply route 3 (three) times a week. Dx:E11.9 LON:99 check BS 3x per week, Disp: 100 each, Rfl: 5   pantoprazole (PROTONIX) 40 MG tablet, TAKE 1 TABLET BY MOUTH DAILY  BEFORE BREAKFAST, Disp: 100 tablet, Rfl: 2   simvastatin (ZOCOR) 40 MG tablet, Take 1 tablet (40 mg total) by mouth at bedtime., Disp: 90 tablet, Rfl: 3  Current Facility-Administered Medications:    clobetasol ointment (TEMOVATE) 0.05 %, , Topical, BID, Amalia Hailey, Nyoka Lint, MD   No Known Allergies   Social History   Tobacco Use   Smoking status: Former    Packs/day: 3.00    Years: 45.00    Total pack years: 135.00    Types: Cigarettes    Quit date: 10/25/2009    Years since quitting: 12.8   Smokeless tobacco: Never   Tobacco comments:    Quit before my husband was diagnosed with small cell cancer.  Vaping Use   Vaping Use: Never used  Substance Use Topics   Alcohol use: Not Currently    Comment: Rarely   Drug use: No      Chart Review Today: I personally reviewed active problem list, medication list, allergies, family history, social history, health maintenance, notes from last encounter, lab results, imaging with the patient/caregiver today.   Review of Systems  Constitutional: Negative.   HENT: Negative.    Eyes: Negative.   Respiratory: Negative.    Cardiovascular: Negative.   Gastrointestinal: Negative.   Endocrine: Negative.   Genitourinary: Negative.   Musculoskeletal: Negative.   Skin: Negative.   Allergic/Immunologic: Negative.   Neurological: Negative.   Hematological: Negative.   Psychiatric/Behavioral: Negative.    All other systems reviewed and are negative.      Objective:   Vitals:   09/02/22 1457  BP: 106/62  Pulse: 96  Resp: 16  Temp: 98.4 F (36.9 C)  TempSrc: Oral  SpO2: 96%  Weight: 166 lb 3.2 oz (75.4 kg)   Height: _0  (1.727 m)    Body mass index is 25.27 kg/m.  Physical Exam Vitals and nursing note reviewed.  Constitutional:      General: She is not in acute distress.    Appearance: Normal appearance. She is well-developed and well-groomed. She is not ill-appearing, toxic-appearing or diaphoretic.     Comments: Appears tired and congested, NAD, non-toxic appearing  HENT:     Head: Normocephalic and atraumatic.     Right Ear: Hearing, tympanic membrane, ear canal and external ear normal.     Left Ear: Hearing, tympanic membrane, ear canal and external ear normal.     Nose: Mucosal edema, congestion and rhinorrhea present.     Right Sinus: No maxillary sinus tenderness or frontal sinus tenderness.  Left Sinus: No maxillary sinus tenderness or frontal sinus tenderness.     Mouth/Throat:     Mouth: Mucous membranes are moist. Mucous membranes are not pale.     Pharynx: Uvula midline. No oropharyngeal exudate, posterior oropharyngeal erythema or uvula swelling.     Tonsils: No tonsillar abscesses.  Eyes:     General: No scleral icterus.       Right eye: No discharge.        Left eye: No discharge.     Conjunctiva/sclera: Conjunctivae normal.  Neck:     Trachea: No tracheal deviation.  Cardiovascular:     Rate and Rhythm: Normal rate and regular rhythm.     Heart sounds: Normal heart sounds.  Pulmonary:     Effort: Pulmonary effort is normal. No respiratory distress.     Breath sounds: No stridor. No wheezing, rhonchi or rales.  Abdominal:     General: Bowel sounds are normal. There is no distension.     Palpations: Abdomen is soft.  Musculoskeletal:        General: Normal range of motion.     Cervical back: Normal range of motion and neck supple.  Skin:    General: Skin is warm and dry.     Coloration: Skin is not pale.     Findings: No rash.  Neurological:     Mental Status: She is alert.     Motor: No abnormal muscle tone.     Coordination: Coordination normal.   Psychiatric:        Behavior: Behavior normal. Behavior is cooperative.      Results for orders placed or performed in visit on 08/30/22  Novel Coronavirus, NAA ([facility])   Specimen: Nasopharyngeal(NP) swabs in vial transport medium   Nasopharynge  Previous  Result Value Ref Range   SARS-CoV-2, NAA Not Detected Not Detected  POCT Influenza A/B  Result Value Ref Range   Influenza A, POC Negative Negative   Influenza B, POC Negative Negative       Assessment & Plan:     ICD-10-CM   1. Upper respiratory tract infection, unspecified type  J06.9 benzonatate (TESSALON) 100 MG capsule    levocetirizine (XYZAL) 5 MG tablet     Pt with continued URI sx, did not accept anything offered to her 2 days ago when seeing Dr. Rosana Berger.  She has tried BP safe OTC meds without much improvement.  Reviewed meds with her today, explained that she has well controlled BP on one lower dose med, she can tolerate some decongestants or cough meds if she needs every once in a while. She has a lot of postnasal drip - encouraged her to focus on drying up nasal discharge - can try antihistamine once daily for a few weeks, cough meds sent in for her to try. Overall  VSS, she is not having any near syncope, CP, SOB, lungs CTA A &P She is eating, drinking well with normal urine output Explained duration of viral illnesses - should be self limiting, not unusual to still be symptomatic 2 days later. Supportive and sx measures and meds thoroughly reviewed    [name], PA-C 09/02/22 3:00 PM

## 2022-09-09 ENCOUNTER — Ambulatory Visit (INDEPENDENT_AMBULATORY_CARE_PROVIDER_SITE_OTHER): Payer: Medicare Other

## 2022-09-09 DIAGNOSIS — Z0000 Encounter for general adult medical examination without abnormal findings: Secondary | ICD-10-CM | POA: Diagnosis not present

## 2022-09-09 NOTE — Patient Instructions (Signed)

## 2022-09-09 NOTE — Progress Notes (Signed)
I connected with  [name] on 09/09/22 by a audio enabled telemedicine application and verified that I am speaking with the correct person using two identifiers.  Patient Location: Home  Provider Location: Office/Clinic  I discussed the limitations of evaluation and management by telemedicine. The patient expressed understanding and agreed to proceed.   Subjective:   Felicia Cisneros is a 71 y.o. female who presents for Medicare Annual (Subsequent) preventive examination.  Review of Systems    Per HPI unless specifically indicated below.        Objective:       09/02/2022    2:57 PM 08/30/2022    2:44 PM 08/24/2022    9:05 AM  Vitals with BMI  Height 5' 8" 5' 8" 5' 8"  Weight 166 lbs 3 oz 167 lbs 6 oz 167 lbs  BMI 25.28 15.17 61.6  Systolic 073 710 626  Diastolic 62 78 80  Pulse 96 114 91    Today's Vitals   09/09/22 1602  PainSc: 0-No pain   There is no height or weight on file to calculate BMI.     09/09/2022    4:03 PM 09/08/2021    9:43 AM 07/08/2020    9:10 AM 05/14/2020    3:46 PM 07/03/2019    9:04 AM 06/05/2019    1:33 PM 08/10/2018    2:24 PM  Advanced Directives  Does Patient Have a Medical Advance Directive? _0  No Yes  Type of Paramedic of [place];Living will [place];Living will [place];Living will [place];Living will  [place];Living will;Out of facility DNR (pink MOST or yellow form)  Does patient want to make changes to medical advance directive? No - Guardian declined      No - Patient declined  Copy of [place] in Chart? No - copy requested No - copy requested No - copy requested  No - copy requested  No - copy requested  Would patient like information on creating a medical advance directive?      No - Patient declined     Current Medications (verified) Outpatient  Encounter Medications as of 09/09/2022  Medication Sig   acetaminophen (TYLENOL) 500 MG tablet Take 1,000 mg by mouth every 6 (six) hours as needed (for pain.).   benzonatate (TESSALON) 100 MG capsule Take 1 capsule (100 mg total) by mouth 3 (three) times daily as needed for cough.   Blood Glucose Monitoring Suppl (ONETOUCH VERIO FLEX SYSTEM) w/Device KIT Check blood sugars up to twice daily prn Dx:E11.9 LON:99   clobetasol cream (TEMOVATE) 0.05 % Apply to labia twice daily for 3 weeks then twice weekly as directed   Coenzyme Q10-Vitamin E (QUNOL ULTRA COQ10) 100-150 MG-UNIT CAPS    Influenza vac split quadrivalent PF (FLUZONE HIGH-DOSE) 0.5 ML injection TO BE ADMINISTERED BY PHARMACIST FOR IMMUNIZATION   levocetirizine (XYZAL) 5 MG tablet Take 1 tablet (5 mg total) by mouth every evening.   lisinopril (ZESTRIL) 5 MG tablet Take 1 tablet (5 mg total) by mouth daily. for blood pressure   Melatonin 2.5 MG CHEW Chew 2.5 mg by mouth daily as needed.   metFORMIN (GLUCOPHAGE-XR) 500 MG 24 hr tablet TAKE 1 TABLET BY MOUTH  DAILY WITH BREAKFAST   Multiple Vitamins-Minerals (CENTRUM SILVER 50+WOMEN) TABS Take 1 tablet by mouth daily.   OneTouch Delica Lancets 94W MISC 1 application by Does  not apply route 3 (three) times a week. Dx:E11.9 LON:99 check BS 3x per week   pantoprazole (PROTONIX) 40 MG tablet TAKE 1 TABLET BY MOUTH DAILY  BEFORE BREAKFAST   simvastatin (ZOCOR) 40 MG tablet Take 1 tablet (40 mg total) by mouth at bedtime.   Cobalamin Combinations (NEURIVA PLUS PO) Take 1 tablet by mouth daily.   Facility-Administered Encounter Medications as of 09/09/2022  Medication   clobetasol ointment (TEMOVATE) 0.05 %    Allergies (verified) Patient has no known allergies.   History: Past Medical History:  Diagnosis Date   Aortic atherosclerosis ([place])    a. 11/2017 noted on Chest CT - Aortic and branch vessel atherosclerosis.   Atrophic vaginitis    Chest pain    a. 06/2013 ETT: Ex time 6:00, Max  HR 136 (87%), 7.1 METS. No ECG changes; b. 07/2018 MV: Ex time: 6:42, nl EF, No ischemia.   COPD (chronic obstructive pulmonary disease) ([place])    a. 11/2017 Chest CT: moderate centrilobular emphysema.   Diabetes mellitus without complication ([place])    Diverticulitis    DNAR (do not attempt resuscitation) 06/09/2017   Discussed today, 06/09/2017   Essential hypertension    GERD (gastroesophageal reflux disease)    History of kidney stones    History of tobacco use    Hyperlipidemia    IFG (impaired fasting glucose)    Irregular heart beat 2018   Leukorrhea, not specified as infective    Osteopenia 05/27/2016   August 2017   Personal history of tobacco use, presenting hazards to health 08/20/2015   Pulmonary nodules    a. 11/2017 CT Chest: No change in bilat pulm nodules, including an ant LUL nodule - 5.58m.   Past Surgical History:  Procedure Laterality Date   colonoscopy     COLONOSCOPY WITH PROPOFOL N/A 02/20/2018   Procedure: COLONOSCOPY WITH PROPOFOL;  Surgeon: WLucilla Lame MD;  [facility] [facility]  Service: Endoscopy;  Laterality: N/A;  Diabetic   CYSTOSCOPY WITH BIOPSY N/A 01/17/2017   Procedure: CYSTOSCOPY WITH BIOPSY;  Surgeon: AHollice Espy MD;  Location: [facility];  Service: Urology;  Laterality: N/A;   POLYPECTOMY  02/20/2018   Procedure: POLYPECTOMY;  Surgeon: WLucilla Lame MD;  Location[facility]  Service: Endoscopy;;   TMJ ARTHROPLASTY  1984   Family History  Problem Relation Age of Onset   Stroke Mother    Hypertension Mother    Hyperlipidemia Mother    Diabetes Mother        pre-diabetic   Hearing loss Mother    Diabetes Brother    Irregular heart beat Brother    COPD Father    Hearing loss Father    Thyroid disease Sister    Stroke Maternal Grandmother    Hyperlipidemia Maternal Grandmother    Hypertension Maternal Grandmother    Cancer Maternal Grandfather        bone   Cancer Sister        cystic fibroid carcinoma   Ulcerative  colitis Sister    Heart disease Paternal Uncle    Heart disease Paternal Uncle    Hyperlipidemia Maternal Aunt    Stroke Maternal Aunt    Breast cancer Neg Hx    Hematuria Neg Hx    Prostate cancer Neg Hx    Renal cancer Neg Hx    Social History   Socioeconomic History   Marital status: Widowed    Spouse name: SCharlyne Mom  Number of children: 1   Years of  education: some college   Highest education level: 12th grade  Occupational History    Employer: [employer]  Tobacco Use   Smoking status: Former    Packs/day: 3.00    Years: 45.00    Total pack years: 135.00    Types: Cigarettes    Quit date: 10/25/2009    Years since quitting: 12.8   Smokeless tobacco: Never   Tobacco comments:    Quit before my husband was diagnosed with small cell cancer.  Vaping Use   Vaping Use: Never used  Substance and Sexual Activity   Alcohol use: Not Currently    Comment: Rarely   Drug use: No   Sexual activity: Not Currently    Birth control/protection: None  Other Topics Concern   Not on file  Social History Narrative   Not on file   Social Determinants of Health   Financial Resource Strain: Low Risk  (12/09/2021)   Overall Financial Resource Strain (CARDIA)    Difficulty of Paying Living Expenses: Not hard at all  Food Insecurity: No Food Insecurity (09/09/2022)   Hunger Vital Sign    Worried About Running Out of Food in the Last Year: Never true    Ran Out of Food in the Last Year: Never true  Transportation Needs: No Transportation Needs (09/09/2022)   PRAPARE - Hydrologist (Medical): No    Lack of Transportation (Non-Medical): No  Physical Activity: Insufficiently Active (09/09/2022)   Exercise Vital Sign    Days of Exercise per Week: 2 days    Minutes of Exercise per Session: 60 min  Stress: No Stress Concern Present (09/09/2022)   [place]    Feeling of  Stress : Not at all  Social Connections: Moderately Integrated (09/09/2022)   Social Connection and Isolation Panel [NHANES]    Frequency of Communication with Friends and Family: More than three times a week    Frequency of Social Gatherings with Friends and Family: Not on file    Attends Religious Services: More than 4 times per year    Active Member of [employer] or Organizations: Yes    Attends Archivist Meetings: More than 4 times per year    Marital Status: Widowed    Tobacco Counseling Counseling given: No Tobacco comments: Quit before my husband was diagnosed with small cell cancer.   Clinical Intake:  Pre-visit preparation completed: No  Pain : No/denies pain Pain Score: 0-No pain     Nutritional Status: BMI of 19-24  Normal Nutritional Risks: None Diabetes: Yes CBG done?: Yes CBG resulted in Enter/ Edit results?: No Did pt. bring in CBG monitor from home?: No  How often do you need to have someone help you when you read instructions, pamphlets, or other written materials from your doctor or pharmacy?: 1 - Never  Diabetic?Nutrition Risk Assessment:  Has the patient had any N/V/D within the last 2 months?  No  Does the patient have any non-healing wounds?  No  Has the patient had any unintentional weight loss or weight gain?  No   Diabetes:  Is the patient diabetic?  Yes  If diabetic, was a CBG obtained today?  Yes  Did the patient bring in their glucometer from home?  No  How often do you monitor your CBG's? 120 bs.   Financial Strains and Diabetes Management:  Are you having any financial strains with the device, your supplies or your  medication? No .  Does the patient want to be seen by Chronic Care Management for management of their diabetes?  No  Would the patient like to be referred to a Nutritionist or for Diabetic Management?  No   Diabetic Exams:  Diabetic Eye Exam: Completed 05/25/2022 Diabetic Foot Exam: Completed 06/07/2022     Interpreter Needed?: No  Information entered by :: Donnie [place], [place]   Activities of Daily Living    09/09/2022    2:15 PM 09/02/2022    2:56 PM  In your present state of health, do you have any difficulty performing the following activities:  Hearing? 1 0  Vision? 1 0  Difficulty concentrating or making decisions? 0 0  Walking or climbing stairs? 1 0  Dressing or bathing? 0 0  Doing errands, shopping? 0 0    Patient Care Team: [name], PA-C as PCP - General (Family Medicine) Oneta Rack, MD as Consulting Physician (Dermatology) Minna Merritts, MD as Consulting Physician (Cardiology) Lucilla Lame, MD as Consulting Physician (Gastroenterology) Germaine Pomfret, [facility] as Pharmacist (Pharmacist)  Indicate any recent Medical Services you may have received from other than [facility] providers in the past year (date may be approximate).    [facility] for lightheadedness, palpitations, and dizziness.  Assessment:   This is a routine wellness examination for [place].  Hearing/Vision screen Admits to some mild hearing loss, seen an audiologist in the past. Annual Eye Exam [facility], Wear glasses Dietary issues and exercise activities discussed: Current Exercise Habits: Structured exercise class, Time (Minutes): 60, Frequency (Times/Week): 2, Weekly Exercise (Minutes/Week): 120, Intensity: Moderate   Goals Addressed             This Visit's Progress    Stay Active and Independent       Why is this important?   Regular activity or exercise is important to managing back pain.  Activity helps to keep your muscles strong.  You will sleep better and feel more relaxed.  You will have more energy and feel less stressed.  If you are not active now, start slowly. Little changes make a big difference.  Rest, but not too much.  Stay as active as you can and listen to your body's signals.            Depression Screen    09/02/2022    2:56 PM  08/30/2022    2:45 PM 08/24/2022    9:05 AM 06/07/2022    8:57 AM 02/25/2022    9:29 AM 12/04/2021   11:11 AM 09/08/2021    9:34 AM  PHQ 2/9 Scores  PHQ - 2 Score 0 0 0 2 2 0 1  PHQ- 9 Score 0 0 0 11 12 0 3    Fall Risk    09/09/2022    2:14 PM 09/02/2022    2:56 PM 08/30/2022    2:45 PM 08/24/2022    9:05 AM 06/07/2022    8:56 AM  Fall Risk   Falls in the past year? 0 0 0 0 0  Number falls in past yr: 0 0 0 0   Injury with Fall? 0 0 0 0   Risk for fall due to : No Fall Risks No Fall Risks  No Fall Risks No Fall Risks  Follow up Falls evaluation completed Falls prevention discussed;Education provided;Falls evaluation completed  Falls prevention discussed;Education provided;Falls evaluation completed Falls prevention discussed    FALL RISK PREVENTION PERTAINING TO THE HOME:  Any stairs in or around the home? Yes  If so, are there any without handrails? No  Home free of loose throw rugs in walkways, pet beds, electrical cords, etc? Yes  Adequate lighting in your home to reduce risk of falls? Yes   ASSISTIVE DEVICES UTILIZED TO PREVENT FALLS:  Life alert? No  Use of a cane, walker or w/c? No  Grab bars in the bathroom? Yes  Shower chair or bench in shower? No  Elevated toilet seat or a handicapped toilet? No   TIMED UP AND GO:  Was the test performed? No .    Cognitive Function:        09/09/2022    2:16 PM 07/08/2020    9:13 AM 07/03/2019    9:07 AM 06/29/2018   10:26 AM  6CIT Screen  What Year? 0 points 0 points 0 points 0 points  What month? 0 points 0 points 0 points 0 points  What time? 0 points 0 points 0 points 0 points  Count back from 20 0 points 0 points 0 points 0 points  Months in reverse 0 points 0 points 0 points 0 points  Repeat phrase 0 points 0 points 4 points 0 points  Total Score 0 points 0 points 4 points 0 points    Immunizations Immunization History  Administered Date(s) Administered   Fluad Quad(high Dose 65+) 07/08/2020   Influenza, High  Dose Seasonal PF 06/12/2017, 06/29/2018, 06/13/2019   Influenza,inj,Quad PF,6+ Mos 08/22/2015   Influenza-Unspecified 06/15/2017, 07/08/2021, 07/21/2022   PFIZER Comirnaty(Gray Top)Covid-19 Tri-Sucrose Vaccine 03/18/2021, 07/21/2022   PFIZER(Purple Top)SARS-COV-2 Vaccination 12/16/2019, 01/08/2020   PNEUMOCOCCAL CONJUGATE-20 02/18/2021   Pfizer Covid-19 Vaccine Bivalent Booster 75yr & up 07/08/2021, 12/23/2021   Pneumococcal Conjugate-13 06/09/2017   Pneumococcal Polysaccharide-23 11/27/2015   Td 07/11/2019   Tdap 10/25/2008, 07/11/2019   Zoster Recombinat (Shingrix) 07/11/2019, 12/05/2019   Zoster, Live 02/27/2016    TDAP status: Up to date  Flu Vaccine status: Up to date  Pneumococcal vaccine status: Up to date  Covid-19 vaccine status: Completed vaccines  Qualifies for Shingles Vaccine? Yes   Zostavax completed Yes   Shingrix Completed?: Yes  Screening Tests Health Maintenance  Topic Date Due   Diabetic kidney evaluation - Urine ACR  08/25/2021   HEMOGLOBIN A1C  08/28/2022   Lung Cancer Screening  09/25/2022   MAMMOGRAM  10/06/2022   FOOT EXAM  06/08/2023   OPHTHALMOLOGY EXAM  06/30/2023   Diabetic kidney evaluation - GFR measurement  08/20/2023   Medicare Annual Wellness (AWV)  09/10/2023   DEXA SCAN  10/07/2023   COLONOSCOPY (Pts 45-457yrInsurance coverage will need to be confirmed)  02/21/2028   TETANUS/TDAP  07/10/2029   Pneumonia Vaccine 6528Years old  Completed   INFLUENZA VACCINE  Completed   COVID-19 Vaccine  Completed   Hepatitis C Screening  Completed   Zoster Vaccines- Shingrix  Completed   HPV VACCINES  Aged Out    Health Maintenance  Health Maintenance Due  Topic Date Due   Diabetic kidney evaluation - Urine ACR  08/25/2021   HEMOGLOBIN A1C  08/28/2022   Lung Cancer Screening  09/25/2022    Colorectal cancer screening: Type of screening: Colonoscopy. Completed 02/20/2018. Repeat every 10 years  Mammogram status: Ordered 10/06/2021. Pt  provided with contact info and advised to call to schedule appt. 10/07/2022  DEXa Scan: completed 10/06/2021  Lung Cancer Screening: (Low Dose CT Chest recommended if Age 71-80ears, 30 pack-year currently smoking OR have quit w/in 15years.)  does not qualify.   Lung Cancer Screening Referral: No  Additional Screening:  Hepatitis C Screening: does qualify; Completed 06/24/2020  Vision Screening: Recommended annual ophthalmology exams for early detection of glaucoma and other disorders of the eye. Is the patient up to date with their annual eye exam?  Yes  Who is the provider or what is the name of the office in which the patient attends annual eye exams? [facility]  If pt is not established with a provider, would they like to be referred to a provider to establish care? No .   Dental Screening: Recommended annual dental exams for proper oral hygiene  Community Resource Referral / Chronic Care Management: CRR required this visit?  No   CCM required this visit?  No      Plan:     I have personally reviewed and noted the following in the patient's chart:   Medical and social history Use of alcohol, tobacco or illicit drugs  Current medications and supplements including opioid prescriptions. Patient is not currently taking opioid prescriptions. Functional ability and status Nutritional status Physical activity Advanced directives List of other physicians Hospitalizations, surgeries, and ER visits in previous 12 months Vitals Screenings to include cognitive, depression, and falls Referrals and appointments  In addition, I have reviewed and discussed with patient certain preventive protocols, quality metrics, and best practice recommendations. A written personalized care plan for preventive services as well as general preventive health recommendations were provided to patient.    Felicia Cisneros , Thank you for taking time to come for your Medicare Wellness Visit. I  appreciate your ongoing commitment to your health goals. Please review the following plan we discussed and let me know if I can assist you in the future.   These are the goals we discussed:  Goals      DIET - INCREASE WATER INTAKE     Recommend to drink at least 6-8 8oz glasses of water per day.     Monitor and Manage My Blood Sugar-Diabetes Type 2     Timeframe:  Long-Range Goal Priority:  High Start Date:  06/04/2021                            Expected End Date:   06/04/2022                    Follow Up within 90 days   - check blood sugar at prescribed times - check blood sugar if I feel it is too high or too low - take the blood sugar log to all doctor visits    Why is this important?   Checking your blood sugar at home helps to keep it from getting very high or very low.  Writing the results in a diary or log helps the doctor know how to care for you.  Your blood sugar log should have the time, date and the results.  Also, write down the amount of insulin or other medicine that you take.  Other information, like what you ate, exercise done and how you were feeling, will also be helpful.     Notes:      Stay Active and Independent     Why is this important?   Regular activity or exercise is important to managing back pain.  Activity helps to keep your muscles strong.  You will sleep better and feel more relaxed.  You will have more energy  and feel less stressed.  If you are not active now, start slowly. Little changes make a big difference.  Rest, but not too much.  Stay as active as you can and listen to your body's signals.             This is a list of the screening recommended for you and due dates:  Health Maintenance  Topic Date Due   Yearly kidney health urinalysis for diabetes  08/25/2021   Hemoglobin A1C  08/28/2022   Screening for Lung Cancer  09/25/2022   Mammogram  10/06/2022   Complete foot exam   06/08/2023   Eye exam for diabetics  06/30/2023    Yearly kidney function blood test for diabetes  08/20/2023   Medicare Annual Wellness Visit  09/10/2023   DEXA scan (bone density measurement)  10/07/2023   Colon Cancer Screening  02/21/2028   Tetanus Vaccine  07/10/2029   Pneumonia Vaccine  Completed   Flu Shot  Completed   COVID-19 Vaccine  Completed   Hepatitis C Screening: USPSTF Recommendation to screen - Ages 18-79 yo.  Completed   Zoster (Shingles) Vaccine  Completed   HPV Vaccine  Aged [address], [place]   09/09/2022   Nurse Notes: Approximately 30 minute Non-face-To-Face Medicare Wellness Visit

## 2022-09-18 NOTE — Progress Notes (Signed)
Cardiology Office Note  Date:  09/20/2022   ID:  Felicia Cisneros, DOB [birth year], MRN [id number]  PCP:  [name], PA-C   Chief Complaint  Patient presents with   12 month follow up     "Doing well." Medications reviewed by the patient verbally.     HPI:  Felicia Cisneros is a very pleasant 71 year old woman with long history of  smoking, quit >5 yrs ago Hyperlipidemia , on statin Remote dizziness and numbness on prior evaluation in 2014,  osteoporosis  CT chest 2016:  No significant coronary ca Mild aorta plaque Anxiety/panic attacks who presents for follow up of her type 2 diabetes, aorta atherosclerosis  Last seen by myself in clinic November 2022 On last clinic visit , [place] and broke rib Then went on cruise, Got covid Had a difficult recovery  Jan 19th 2024 going on a cruise, [place], [place] husband 7 years ago, lung cancer Still some difficulty adjusting Goes to exercise class 2x a week No recent falls  No chest pain or SOB on exertion  Has had panic attacks/anxiety episodes In the ER 08/19/22 Anxiety attack, called EMS Does not have a pill for anxiety  CT scan reviewed from 06/2020 Mild aortic atherosclerosis, minimal coronary calcification  Labs reviewed Total chol 131, LDL 54 HBA1C 5.9  EKG personally reviewed by myself on todays visit Shows normal sinus rhythm with rate 81 bpm, no significant ST or T wave changes   Other past medical history reviewed CT chest 2016:  No significant coronary ca Mild diffuse aortic atherosclerosis Seen in the aortic arch, descending aorta   She has osteopenia noted on the DEXA, Hypertension, Type 2 diabetes; hyperlipidemia,   PMH:   has a past medical history of Aortic atherosclerosis ([place]), Atrophic vaginitis, Chest pain, COPD (chronic obstructive pulmonary disease) ([place]), Diabetes mellitus without complication ([place]), Diverticulitis, DNAR (do not attempt resuscitation) (06/09/2017), Essential  hypertension, GERD (gastroesophageal reflux disease), History of kidney stones, History of tobacco use, Hyperlipidemia, IFG (impaired fasting glucose), Irregular heart beat (2018), Leukorrhea, not specified as infective, Osteopenia (05/27/2016), Personal history of tobacco use, presenting hazards to health (08/20/2015), and Pulmonary nodules.  PSH:    Past Surgical History:  Procedure Laterality Date   colonoscopy     COLONOSCOPY WITH PROPOFOL N/A 02/20/2018   Procedure: COLONOSCOPY WITH PROPOFOL;  Surgeon: Lucilla Lame, MD;  Location: [facility];  Service: Endoscopy;  Laterality: N/A;  Diabetic   CYSTOSCOPY WITH BIOPSY N/A 01/17/2017   Procedure: CYSTOSCOPY WITH BIOPSY;  Surgeon: Hollice Espy, MD;  Location: [facility];  Service: Urology;  Laterality: N/A;   POLYPECTOMY  02/20/2018   Procedure: POLYPECTOMY;  Surgeon: Lucilla Lame, MD;  Location: [facility];  Service: Endoscopy;;   TMJ ARTHROPLASTY  1984    Current Outpatient Medications  Medication Sig Dispense Refill   acetaminophen (TYLENOL) 500 MG tablet Take 1,000 mg by mouth every 6 (six) hours as needed (for pain.).     aspirin EC 81 MG tablet Take 81 mg by mouth daily. Swallow whole.     Blood Glucose Monitoring Suppl (ONETOUCH VERIO FLEX SYSTEM) w/Device KIT Check blood sugars up to twice daily prn Dx:E11.9 LON:99 1 kit 0   clobetasol cream (TEMOVATE) 0.05 % Apply to labia twice daily for 3 weeks then twice weekly as directed 60 g 2   Cobalamin Combinations (NEURIVA PLUS PO) Take 1 tablet by mouth daily.     Coenzyme Q10-Vitamin E (QUNOL ULTRA COQ10) 100-150 MG-UNIT CAPS  Influenza vac split quadrivalent PF (FLUZONE HIGH-DOSE) 0.5 ML injection TO BE ADMINISTERED BY PHARMACIST FOR IMMUNIZATION     levocetirizine (XYZAL) 5 MG tablet Take 1 tablet (5 mg total) by mouth every evening. 30 tablet 0   lisinopril (ZESTRIL) 5 MG tablet Take 1 tablet (5 mg total) by mouth daily. for blood pressure 90 tablet 3   Melatonin 2.5  MG CHEW Chew 2.5 mg by mouth daily as needed.     metFORMIN (GLUCOPHAGE-XR) 500 MG 24 hr tablet TAKE 1 TABLET BY MOUTH  DAILY WITH BREAKFAST 90 tablet 3   Multiple Vitamins-Minerals (CENTRUM SILVER 50+WOMEN) TABS Take 1 tablet by mouth daily.     OneTouch Delica Lancets 54Y MISC 1 application by Does not apply route 3 (three) times a week. Dx:E11.9 LON:99 check BS 3x per week 100 each 5   pantoprazole (PROTONIX) 40 MG tablet TAKE 1 TABLET BY MOUTH DAILY  BEFORE BREAKFAST 100 tablet 2   simvastatin (ZOCOR) 40 MG tablet Take 1 tablet (40 mg total) by mouth at bedtime. 90 tablet 3   Current Facility-Administered Medications  Medication Dose Route Frequency Provider Last Rate Last Admin   clobetasol ointment (TEMOVATE) 0.05 %   Topical BID Harlin Heys, MD         Allergies:   Patient has no known allergies.   Social History:  The patient  reports that she quit smoking about 12 years ago. Her smoking use included cigarettes. She has a 135.00 pack-year smoking history. She has never used smokeless tobacco. She reports that she does not currently use alcohol. She reports that she does not use drugs.   Family History:   family history includes COPD in her father; Cancer in her maternal grandfather and sister; Diabetes in her brother and mother; Hearing loss in her father and mother; Heart disease in her paternal uncle and paternal uncle; Hyperlipidemia in her maternal aunt, maternal grandmother, and mother; Hypertension in her maternal grandmother and mother; Irregular heart beat in her brother; Stroke in her maternal aunt, maternal grandmother, and mother; Thyroid disease in her sister; Ulcerative colitis in her sister.    Review of Systems: Review of Systems  Constitutional: Negative.   Respiratory: Negative.    Cardiovascular: Negative.   Gastrointestinal: Negative.   Musculoskeletal: Negative.   Neurological: Negative.   Psychiatric/Behavioral: Negative.    All other systems reviewed  and are negative.   PHYSICAL EXAM: VS:  BP 110/70 (BP Location: Right Arm, Patient Position: Sitting, Cuff Size: Normal)   Pulse 81   Ht _0  (1.727 m)   Wt 169 lb 6 oz (76.8 kg)   SpO2 96%   BMI 25.75 kg/m  , BMI Body mass index is 25.75 kg/m. Constitutional:  oriented to person, place, and time. No distress.  HENT:  Head: Grossly normal Eyes:  no discharge. No scleral icterus.  Neck: No JVD, no carotid bruits  Cardiovascular: Regular rate and rhythm, no murmurs appreciated Pulmonary/Chest: Clear to auscultation bilaterally, no wheezes or rails Abdominal: Soft.  no distension.  no tenderness.  Musculoskeletal: Normal range of motion Neurological:  normal muscle tone. Coordination normal. No atrophy Skin: Skin warm and dry Psychiatric: normal affect, pleasant  Recent Labs: 08/19/2022: ALT 21; BUN 19; Creatinine, Ser 0.69; Hemoglobin 12.3; Platelets 193; Potassium 4.0; Sodium 141    Lipid Panel Lab Results  Component Value Date   CHOL 111 12/04/2021   HDL 57 12/04/2021   LDLCALC 40 12/04/2021   TRIG 68 12/04/2021  Wt Readings from Last 3 Encounters:  09/20/22 169 lb 6 oz (76.8 kg)  09/02/22 166 lb 3.2 oz (75.4 kg)  08/30/22 167 lb 6.4 oz (75.9 kg)     ASSESSMENT AND PLAN:  Hyperlipidemia Cholesterol is at goal on the current lipid regimen. No changes to the medications were made.  History of tobacco use Quit 10 years CT scan chest 2021, no issues mild aortic atherosclerosis, minimal coronary calcification if any Denies anginal symptoms  Controlled type 2 diabetes mellitus with other circulatory complication, without long-term current use of insulin (H[facility])  A1c well controlled, A1C 5.9 Active, weight stable  Aortic atherosclerosis ([facility]) Cholesterol at Goal   Total encounter time more than 30 minutes  Greater than 50% was spent in counseling and coordination of care with the patient   No orders of the defined types were placed in this  encounter.    Signed, Esmond Plants, M.D., Ph.D. 09/20/2022  [place], [place]

## 2022-09-20 ENCOUNTER — Encounter: Payer: Self-pay | Admitting: Family Medicine

## 2022-09-20 ENCOUNTER — Ambulatory Visit: Payer: Medicare Other | Attending: Cardiovascular Disease | Admitting: Cardiovascular Disease

## 2022-09-20 ENCOUNTER — Encounter: Payer: Self-pay | Admitting: Cardiovascular Disease

## 2022-09-20 VITALS — BP 110/70 | HR 81 | Ht 68.0 in | Wt 169.4 lb

## 2022-09-20 DIAGNOSIS — I700 Atherosclerosis of aorta: Secondary | ICD-10-CM | POA: Diagnosis not present

## 2022-09-20 DIAGNOSIS — R0789 Other chest pain: Secondary | ICD-10-CM

## 2022-09-20 DIAGNOSIS — E119 Type 2 diabetes mellitus without complications: Secondary | ICD-10-CM | POA: Diagnosis not present

## 2022-09-20 DIAGNOSIS — E782 Mixed hyperlipidemia: Secondary | ICD-10-CM | POA: Diagnosis not present

## 2022-09-20 DIAGNOSIS — I10 Essential (primary) hypertension: Secondary | ICD-10-CM | POA: Diagnosis not present

## 2022-09-20 NOTE — Patient Instructions (Addendum)
Medication Instructions:  No changes  If you need a refill on your cardiac medications before your next appointment, please call your pharmacy.   Lab work: No new labs needed  Testing/Procedures: No new testing needed  Follow-Up: At [facility], you and your health needs are our priority.  As part of our continuing mission to provide you with exceptional heart care, we have created designated Provider Care Teams.  These Care Teams include your primary Cardiologist (physician) and Advanced Practice Providers (APPs -  Physician Assistants and Nurse Practitioners) who all work together to provide you with the care you need, when you need it.  You will need a follow up appointment in 12 months  Providers on your designated Care Team:   Christopher Berge, NP Ryan Dunn, PA-C Cadence Furth, PA-C  COVID-19 Vaccine Information can be found at: [email or web address] For questions related to vaccine distribution or appointments, please email [email or web address] or call [phone].   

## 2022-09-27 ENCOUNTER — Ambulatory Visit: Admission: RE | Admit: 2022-09-27 | Payer: Medicare Other | Source: Ambulatory Visit

## 2022-09-29 ENCOUNTER — Other Ambulatory Visit: Payer: Self-pay | Admitting: Acute Care

## 2022-09-29 ENCOUNTER — Telehealth: Payer: Self-pay | Admitting: Family Medicine

## 2022-09-29 DIAGNOSIS — Z720 Tobacco use: Secondary | ICD-10-CM

## 2022-09-29 NOTE — Telephone Encounter (Signed)
Copied from [place]. Topic: General - Other >> Sep 29, 2022 12:05 PM Felicia Cisneros J wrote: Reason for CRM: Pt was advised by imaging to have an appt resubmitted for a low dose cat scan / they told pt they didn't have an appt/ please advise pt

## 2022-09-29 NOTE — Telephone Encounter (Signed)
Called pt no answer left vm stating there is only a CT CHEST LUNG CANCER SCREENING LOW DOSE scheduled for 10/08/2022 at 9:00am at [facility] (right across the street from [place]).

## 2022-10-07 ENCOUNTER — Ambulatory Visit
Admission: RE | Admit: 2022-10-07 | Discharge: 2022-10-07 | Disposition: A | Discharge status: Home / Self Care | Payer: Medicare Other | Source: Ambulatory Visit | Attending: Family Medicine | Admitting: Family Medicine

## 2022-10-07 DIAGNOSIS — Z1231 Encounter for screening mammogram for malignant neoplasm of breast: Secondary | ICD-10-CM | POA: Diagnosis present

## 2022-10-08 ENCOUNTER — Ambulatory Visit
Admission: RE | Admit: 2022-10-08 | Discharge: 2022-10-08 | Disposition: A | Discharge status: Home / Self Care | Payer: Medicare Other | Source: Ambulatory Visit | Attending: Acute Care | Admitting: Acute Care

## 2022-10-08 DIAGNOSIS — Z122 Encounter for screening for malignant neoplasm of respiratory organs: Secondary | ICD-10-CM | POA: Insufficient documentation

## 2022-10-08 DIAGNOSIS — J439 Emphysema, unspecified: Secondary | ICD-10-CM | POA: Diagnosis not present

## 2022-10-08 DIAGNOSIS — Z87891 Personal history of nicotine dependence: Secondary | ICD-10-CM | POA: Insufficient documentation

## 2022-10-08 DIAGNOSIS — M8580 Other specified disorders of bone density and structure, unspecified site: Secondary | ICD-10-CM | POA: Diagnosis not present

## 2022-10-08 DIAGNOSIS — Z720 Tobacco use: Secondary | ICD-10-CM

## 2022-10-08 DIAGNOSIS — K5730 Diverticulosis of large intestine without perforation or abscess without bleeding: Secondary | ICD-10-CM | POA: Diagnosis not present

## 2022-10-08 DIAGNOSIS — I700 Atherosclerosis of aorta: Secondary | ICD-10-CM | POA: Insufficient documentation

## 2022-10-11 ENCOUNTER — Ambulatory Visit: Payer: Self-pay | Admitting: *Deleted

## 2022-10-11 ENCOUNTER — Telehealth: Payer: Self-pay

## 2022-10-11 ENCOUNTER — Encounter: Payer: Self-pay | Admitting: Family Medicine

## 2022-10-11 NOTE — Telephone Encounter (Signed)
Summary: Positive for covid, Rx request   Pt has tested positive for covid, she wants a Rx for paxlovid. Please advise       Reason for Disposition  [1] HIGH RISK patient (e.g., weak immune system, age > 50 years, obesity with BMI 30 or higher, pregnant, chronic lung disease or other chronic medical condition) AND [2] COVID symptoms (e.g., cough, fever)  (Exceptions: Already seen by PCP and no new or worsening symptoms.)  Answer Assessment - Initial Assessment Questions 1. COVID-19 DIAGNOSIS: "How do you know that you have COVID?" (e.g., positive lab test or self-test, diagnosed by doctor or NP/PA, symptoms after exposure).     Home test 11:30 this morning 2. COVID-19 EXPOSURE: "Was there any known exposure to COVID before the symptoms began?" CDC Definition of close contact: within 6 feet (2 meters) for a total of 15 minutes or more over a 24-hour period.      Called everyone she was with on Friday 3. ONSET: "When did the COVID-19 symptoms start?"      Last night 4. WORST SYMPTOM: "What is your worst symptom?" (e.g., cough, fever, shortness of breath, muscle aches)     Sniffles, Runny nose , Sneezing 5. COUGH: "Do you have a cough?" If Yes, ask: "How bad is the cough?"       no 6. FEVER: "Do you have a fever?" If Yes, ask: "What is your temperature, how was it measured, and when did it start?"     no 7. RESPIRATORY STATUS: "Describe your breathing?" (e.g., normal; shortness of breath, wheezing, unable to speak)      no 8. BETTER-SAME-WORSE: "Are you getting better, staying the same or getting worse compared to yesterday?"  If getting worse, ask, "In what way?"     Worse 9. OTHER SYMPTOMS: "Do you have any other symptoms?"  (e.g., chills, fatigue, headache, loss of smell or taste, muscle pain, sore throat)     no 10. HIGH RISK DISEASE: "Do you have any chronic medical problems?" (e.g., asthma, heart or lung disease, weak immune system, obesity, etc.)       no 11. VACCINE: "Have you had  the COVID-19 vaccine?" If Yes, ask: "Which one, how many shots, when did you get it?"        12. PREGNANCY: "Is there any chance you are pregnant?" "When was your last menstrual period?"       na 13. O2 SATURATION MONITOR:  "Do you use an oxygen saturation monitor (pulse oximeter) at home?" If Yes, ask "What is your reading (oxygen level) today?" "What is your usual oxygen saturation reading?" (e.g., 95%)  Protocols used: Coronavirus (COVID-19) Diagnosed or Suspected-A-AH

## 2022-10-11 NOTE — Telephone Encounter (Signed)
  Chief Complaint: COVID +  Symptoms: Sneezing, Runny nose, Sniffles, cough Frequency: This morning Pertinent Negatives: Patient denies Fever Disposition: '[]'$ ED /'[]'$ Urgent Care (no appt availability in office) / '[x]'$ Appointment(In office/virtual)/ '[]'$  [facility]/ '[]'$ Home Care/ '[]'$ Refused Recommended Disposition /'[]'$ [facility]/ '[]'$  Follow-up with PCP Additional Notes: Pt tested + for COVID this morning.  Pt currently has mild s/s. PT is interested in Anti-viral medication.   PT is also interested in knowing why she needs another lung scan in 6 months, as part of the study.

## 2022-10-11 NOTE — Telephone Encounter (Signed)
Reeived call from [facility] Radiology on call report from [place] on CT lung cancer screening:  IMPRESSION: 1. Lung-RADS 3, probably benign findings. Short-term follow-up in 6 months is recommended with repeat low-dose chest CT without contrast (please use the following order, "CT CHEST LCS NODULE FOLLOW-UP W/O CM"). New indistinct posterior right upper lobe subpleural nodular opacity measuring 5.5 mm in volume derived mean diameter, favoring nodular scarring or atelectasis. 2. Diffuse osteopenia. 3. Mild left colonic diverticulosis. 4. Aortic Atherosclerosis (ICD10-I70.0) and Emphysema (ICD10-J43.9).

## 2022-10-11 NOTE — Telephone Encounter (Signed)
Summary: Positive for covid, Rx request   Pt has tested positive for covid, she wants a Rx for paxlovid. Please advise      Called patient on [phone] to review covid positive sx. No answer. LVMTCB [phone].

## 2022-10-12 ENCOUNTER — Ambulatory Visit (INDEPENDENT_AMBULATORY_CARE_PROVIDER_SITE_OTHER): Payer: Medicare Other | Admitting: Family Medicine

## 2022-10-12 ENCOUNTER — Encounter: Payer: Self-pay | Admitting: Family Medicine

## 2022-10-12 VITALS — BP 112/68 | HR 90 | Temp 98.4°F | Resp 16 | Ht 68.0 in | Wt 165.6 lb

## 2022-10-12 DIAGNOSIS — K5909 Other constipation: Secondary | ICD-10-CM | POA: Diagnosis not present

## 2022-10-12 DIAGNOSIS — J069 Acute upper respiratory infection, unspecified: Secondary | ICD-10-CM | POA: Insufficient documentation

## 2022-10-12 DIAGNOSIS — U071 COVID-19: Secondary | ICD-10-CM | POA: Diagnosis not present

## 2022-10-12 DIAGNOSIS — F419 Anxiety disorder, unspecified: Secondary | ICD-10-CM | POA: Diagnosis not present

## 2022-10-12 MED ORDER — NIRMATRELVIR/RITONAVIR (PAXLOVID)TABLET: 3.0000 | ORAL_TABLET | Freq: Two times a day (BID) | ORAL | 0 refills | Status: AC

## 2022-10-12 NOTE — Patient Instructions (Signed)
Isolate for 5 days - today is day one and Saturday would be day five If on Sunday you have no fever and feel overall improving you can be done with isolation but will need to still wear a mask in public for an additional 5 days to be careful.  Please follow up if you have any worsening symptoms.

## 2022-10-12 NOTE — Progress Notes (Signed)
Patient ID: Felicia Cisneros, female    DOB: 01-15-1951, 71 y.o.   MRN: [id number]  PCP: [name], PA-C  Chief Complaint  Patient presents with   Covid Positive    Yesterday at home test   Cough   Nasal Congestion    Sx since Monday night    Subjective:   Felicia Cisneros is a 71 y.o. female, presents to clinic with CC of the following:  HPI  Here for COVID+ URI sx, cough, tested yesterday she has had nasal congestion off and on since Nov - she noted worse nasal sx this week, COVID + yesterday, likely exposure at work She overall feels tired, some mild coughing that is dry, no fever, mostly nasal drainage The patient called the call center to discuss with them her positive COVID test can ask about antiviral medications and she was referred to come in today for an appointment Patient denies any near syncope, chest pain, shortness of breath  She also had questions about anxiety per mychart msgs - episodes of panic attacks and palpitations - rare - 1 to 2x a year - her cardiologist suggested she come to discuss low dose anxiety med that could be taken with episodes to try and prevent ED visits if the sx are anxiety and not cardiac.  She has never discussed this previously, been on meds or consulted with psych  Nasal sx, more drainage and more green, sneeze a lot, sx started yesterday with home test positive and exposure to COVID at work     Patient Active Problem List   Diagnosis Date Noted   Restless leg syndrome 06/07/2022   Retinal detachment with single break, right 12/31/2020   GERD without esophagitis 03/06/2019   Benign neoplasm of sigmoid colon    Centrilobular emphysema ([place]) 12/02/2016   Pulmonary nodules/lesions, multiple 12/02/2016   Aortic atherosclerosis ([place]) 07/08/2016   Osteopenia 05/27/2016   Essential hypertension    Type 2 diabetes mellitus, controlled (HCC)    Atrophic vaginitis    History of tobacco use    Hyperlipidemia 07/13/2013       Current Outpatient Medications:    acetaminophen (TYLENOL) 500 MG tablet, Take 1,000 mg by mouth every 6 (six) hours as needed (for pain.)., Disp: , Rfl:    aspirin EC 81 MG tablet, Take 81 mg by mouth daily. Swallow whole., Disp: , Rfl:    Blood Glucose Monitoring Suppl (ONETOUCH VERIO FLEX SYSTEM) w/Device KIT, Check blood sugars up to twice daily prn Dx:E11.9 LON:99, Disp: 1 kit, Rfl: 0   clobetasol cream (TEMOVATE) 0.05 %, Apply to labia twice daily for 3 weeks then twice weekly as directed, Disp: 60 g, Rfl: 2   Cobalamin Combinations (NEURIVA PLUS PO), Take 1 tablet by mouth daily., Disp: , Rfl:    Coenzyme Q10-Vitamin E (QUNOL ULTRA COQ10) 100-150 MG-UNIT CAPS, , Disp: , Rfl:    Influenza vac split quadrivalent PF (FLUZONE HIGH-DOSE) 0.5 ML injection, TO BE ADMINISTERED BY PHARMACIST FOR IMMUNIZATION, Disp: , Rfl:    levocetirizine (XYZAL) 5 MG tablet, Take 1 tablet (5 mg total) by mouth every evening., Disp: 30 tablet, Rfl: 0   lisinopril (ZESTRIL) 5 MG tablet, Take 1 tablet (5 mg total) by mouth daily. for blood pressure, Disp: 90 tablet, Rfl: 3   Melatonin 2.5 MG CHEW, Chew 2.5 mg by mouth daily as needed., Disp: , Rfl:    metFORMIN (GLUCOPHAGE-XR) 500 MG 24 hr tablet, TAKE 1 TABLET BY MOUTH  DAILY WITH  BREAKFAST, Disp: 90 tablet, Rfl: 3   Multiple Vitamins-Minerals (CENTRUM SILVER 50+WOMEN) TABS, Take 1 tablet by mouth daily., Disp: , Rfl:    OneTouch Delica Lancets 56C MISC, 1 application by Does not apply route 3 (three) times a week. Dx:E11.9 LON:99 check BS 3x per week, Disp: 100 each, Rfl: 5   pantoprazole (PROTONIX) 40 MG tablet, TAKE 1 TABLET BY MOUTH DAILY  BEFORE BREAKFAST, Disp: 100 tablet, Rfl: 2   simvastatin (ZOCOR) 40 MG tablet, Take 1 tablet (40 mg total) by mouth at bedtime., Disp: 90 tablet, Rfl: 3  Current Facility-Administered Medications:    clobetasol ointment (TEMOVATE) 0.05 %, , Topical, BID, Amalia Hailey, Nyoka Lint, MD   No Known Allergies   Social  History   Tobacco Use   Smoking status: Former    Packs/day: 3.00    Years: 45.00    Total pack years: 135.00    Types: Cigarettes    Quit date: 10/25/2009    Years since quitting: 12.9   Smokeless tobacco: Never   Tobacco comments:    Quit before my husband was diagnosed with small cell cancer.  Vaping Use   Vaping Use: Never used  Substance Use Topics   Alcohol use: Not Currently    Comment: Rarely   Drug use: No      Chart Review Today: I personally reviewed active problem list, medication list, allergies, family history, social history, health maintenance, notes from last encounter, lab results, imaging with the patient/caregiver today.   Review of Systems  Constitutional: Negative.   HENT: Negative.    Eyes: Negative.   Respiratory: Negative.    Cardiovascular: Negative.   Gastrointestinal: Negative.   Endocrine: Negative.   Genitourinary: Negative.   Musculoskeletal: Negative.   Skin: Negative.   Allergic/Immunologic: Negative.   Neurological: Negative.   Hematological: Negative.   Psychiatric/Behavioral: Negative.    All other systems reviewed and are negative. All neg except noted in HPI     Objective:   Vitals:   10/12/22 0942  BP: 112/68  Pulse: 90  Resp: 16  Temp: 98.4 F (36.9 C)  TempSrc: Oral  SpO2: 94%  Weight: 165 lb 9.6 oz (75.1 kg)  Height: _0  (1.727 m)    Body mass index is 25.18 kg/m.  Physical Exam Vitals and nursing note reviewed.  Constitutional:      General: She is not in acute distress.    Appearance: Normal appearance. She is well-developed and well-groomed. She is not ill-appearing, toxic-appearing or diaphoretic.     Interventions: Face mask in place.  HENT:     Head: Normocephalic and atraumatic.     Nose: Nose normal.  Eyes:     General:        Right eye: No discharge.        Left eye: No discharge.     Conjunctiva/sclera: Conjunctivae normal.  Neck:     Trachea: No tracheal deviation.  Cardiovascular:      Rate and Rhythm: Normal rate and regular rhythm.  Pulmonary:     Effort: Pulmonary effort is normal. No tachypnea, accessory muscle usage, respiratory distress or retractions.     Breath sounds: Normal breath sounds and air entry. No stridor, decreased air movement or transmitted upper airway sounds. No decreased breath sounds, wheezing, rhonchi or rales.  Musculoskeletal:        General: Normal range of motion.  Skin:    General: Skin is warm and dry.     Findings: No rash.  Neurological:     Mental Status: She is alert.     Motor: No abnormal muscle tone.     Coordination: Coordination normal.  Psychiatric:        Behavior: Behavior normal. Behavior is cooperative.      Results for orders placed or performed in visit on 08/30/22  Novel Coronavirus, NAA ([facility])   Specimen: Nasopharyngeal(NP) swabs in vial transport medium   Nasopharynge  Previous  Result Value Ref Range   SARS-CoV-2, NAA Not Detected Not Detected  POCT Influenza A/B  Result Value Ref Range   Influenza A, POC Negative Negative   Influenza B, POC Negative Negative       Assessment & Plan:     ICD-10-CM   1. Upper respiratory tract infection due to COVID-19 virus  U07.1 nirmatrelvir/ritonavir EUA (PAXLOVID) 20 x 150 MG & 10 x 100MG TABS   J06.9     2. Anxiety disorder, unspecified type  F41.9    only 3 severe episoded in 7 years, hesitant to do any therapy due to infrequency, explained meds are sedating and controlled, would rx with psych/therapy    3. Other constipation  K59.09    hold iron and continue stool softener, she has routine f/up and iron/CBC was improving, hold supplement for not due to bowel changes     While on paxlovid pt was advised to hold her simvastatin  Reviewed COVID + isolation and end of isolation, also reviewed and discussed antiviral medications, possible side effects, concerning COVID symptoms that warrant further evaluation or emergent evaluation, and reviewed her timeline to  yesterday was onset of symptoms today is day 1-day 5 will be Saturday, December 23 and if she is been afebrile for more than 24 hours and is not having any worsening respiratory symptoms she can likely break strict I's and patient on Christmas eve but should wear a mask when around others or in public   Regarding a few episodes of panic attack she states that there is no warning she does not know what the trigger is she has had about 3 episodes over the past 7 years where she has sudden onset palpitations chest pain and shortness of breath she is worried that she could be having a heart attack so she called the ambulance and goes to the ER just to be told that it was likely a panic attack her cardiologist had recommended her get something for anxiety.  I explained that if there is truly panic attacks or panic disorder it is usually best managed with psychotherapy, cognitive behavioral therapy or some evaluation by psychiatry and the medications that are quick and effective for such severe panic attacks are usually very sedating/controlled substances/benzodiazepines and I would only prescribe them with managing specialist and therapy.  At this time with the infrequent nature benzos may not be an option.  She may wish to try hydralazine or BuSpar I did attempt to validate her concerns and reassure her of the "embarrassing" episodes the eval is necessary and commong -   if and when she has severe cardiac symptoms (ie: palpitations, CP, SOB etc) at her age it is appropriate to get evaluated to make sure it is not cardiac in nature  Offered referral to psych  additionally reviewed OTC meds that are safe for her to use with her current URI COVID infection -she can use antihistamines like Claritin Zyrtec or Allegra, nasal sprays or saline spray, decongestant she is currently taking Sudafed and this has helped with her  headache she can also take Tylenol and I have explained to her that it is very safe for her to  use a little bit of ibuprofen or Aleve here and there when she is ill her blood pressure is well-controlled she has good and normal kidney function.  She has had multiple office visits recently with viral illnesses and she always explains how she cannot use multiple medications because of her blood pressure however her blood pressure is always well-controlled at 112/68 today, she does tend to have some excessive concern for her health though I have reassured her that her blood pressure and heart health are good enough that she can use some over-the-counter medicines here and there when needed.   [name], PA-C 10/12/22 9:56 AM

## 2022-10-13 ENCOUNTER — Telehealth: Payer: Self-pay | Admitting: Acute Care

## 2022-10-13 ENCOUNTER — Telehealth: Payer: Self-pay | Admitting: *Deleted

## 2022-10-13 NOTE — Telephone Encounter (Signed)
Left message for pt to call back regarding lung screening CT results.  

## 2022-10-13 NOTE — Telephone Encounter (Signed)
I have attempted to call the patient with the results of their  Low Dose CT Chest Lung cancer screening scan. There was no answer. I have left a HIPPA compliant VM requesting the patient call the office for the scan results. I included the office contact information in the message. We will await his return call. If no return call we will continue to call until patient is contacted.   

## 2022-10-13 NOTE — Telephone Encounter (Signed)
She needs a 6 month follow up if she calls back. Thanks [employer]

## 2022-10-14 ENCOUNTER — Encounter: Payer: Self-pay | Admitting: *Deleted

## 2022-10-14 NOTE — Telephone Encounter (Signed)
Letter sent to pt via MyChart asking her to call us to discuss CT results.

## 2022-10-15 NOTE — Telephone Encounter (Signed)
Left message on voicemail for pt to call back regarding lung screening CT results

## 2022-10-21 NOTE — Telephone Encounter (Signed)
Left another voicemail message for pt to call back regarding lung screening CT results

## 2022-10-23 ENCOUNTER — Other Ambulatory Visit: Payer: Self-pay | Admitting: Family Medicine

## 2022-10-23 DIAGNOSIS — E119 Type 2 diabetes mellitus without complications: Secondary | ICD-10-CM

## 2022-10-24 NOTE — Telephone Encounter (Signed)
Requested Prescriptions  Pending Prescriptions Disp Refills   Blood Glucose Monitoring Suppl (ONETOUCH VERIO FLEX SYSTEM) w/Device KIT [Pharmacy Med Name: [facility] w/Device Kit] 1 kit 1    Sig: CHECK BLOOD SUGAR UP TO  TWICE DAILY AS NEEDED     Endocrinology: Diabetes - Testing Supplies Passed - 10/23/2022 10:40 AM      Passed - Valid encounter within last 12 months    Recent Outpatient Visits           1 week ago Upper respiratory tract infection due to COVID-19 virus   [facility] [name], PA-C   1 month ago Upper respiratory tract infection, unspecified type   [facility] [name], PA-C   1 month ago Acute cough   [place], DO   2 months ago Encounter for examination following treatment at hospital   [facility] [name], PA-C   4 months ago Controlled type 2 diabetes mellitus with other circulatory complication, without long-term current use of insulin [facility])   [facility] [name], PA-C       Future Appointments             In 1 month [name], PA-C [facility], [facility]

## 2022-10-26 NOTE — Telephone Encounter (Signed)
Letter mailed to pt via [employer] asking her to call us back to discuss CT results.

## 2022-10-28 ENCOUNTER — Telehealth: Payer: Self-pay | Admitting: Acute Care

## 2022-10-28 DIAGNOSIS — Z87891 Personal history of nicotine dependence: Secondary | ICD-10-CM

## 2022-10-28 DIAGNOSIS — R911 Solitary pulmonary nodule: Secondary | ICD-10-CM

## 2022-10-28 NOTE — Telephone Encounter (Signed)
See other telephone note from 10/13/22 and 10/28/22.

## 2022-10-28 NOTE — Telephone Encounter (Signed)
I have attempted to call the patient with the results of their  Low Dose CT Chest Lung cancer screening scan. There was no answer. I have left a HIPPA compliant VM requesting the patient call the office for the scan results. I included the office contact information in the message. We will await his return call. If no return call we will continue to call until patient is contacted.   

## 2022-11-01 ENCOUNTER — Encounter: Payer: Self-pay | Admitting: Obstetrics and Gynecology

## 2022-11-01 ENCOUNTER — Other Ambulatory Visit: Payer: Self-pay

## 2022-11-01 DIAGNOSIS — N952 Postmenopausal atrophic vaginitis: Secondary | ICD-10-CM

## 2022-11-01 MED ORDER — CLOBETASOL PROPIONATE 0.05 % EX CREA: TOPICAL_CREAM | CUTANEOUS | 2 refills | Status: DC

## 2022-11-01 NOTE — Telephone Encounter (Signed)
Spoke with pt and reviewed lung screening CT results. I advised pt that there is a new small nodule seen that we want to look at again in 6 months. Pt verbalized understanding and understands that we will call her closer to 6 month to schedule follow up CT. CT results faxed to PCP with f/u plans included. Order placed for 6 mth nodule f/u [facility] CT.

## 2022-11-01 NOTE — Telephone Encounter (Signed)
See telephone note 10/28/2022

## 2022-11-10 DIAGNOSIS — M19072 Primary osteoarthritis, left ankle and foot: Secondary | ICD-10-CM | POA: Diagnosis not present

## 2022-11-22 ENCOUNTER — Encounter: Payer: Self-pay | Admitting: Family Medicine

## 2022-12-01 ENCOUNTER — Telehealth: Payer: Medicare Other

## 2022-12-09 ENCOUNTER — Encounter: Payer: Self-pay | Admitting: Family Medicine

## 2022-12-09 ENCOUNTER — Ambulatory Visit (INDEPENDENT_AMBULATORY_CARE_PROVIDER_SITE_OTHER): Payer: Medicare Other | Admitting: Family Medicine

## 2022-12-09 VITALS — BP 118/80 | HR 94 | Temp 97.7°F | Resp 16 | Ht 68.0 in | Wt 166.0 lb

## 2022-12-09 DIAGNOSIS — Z5181 Encounter for therapeutic drug level monitoring: Secondary | ICD-10-CM | POA: Diagnosis not present

## 2022-12-09 DIAGNOSIS — I10 Essential (primary) hypertension: Secondary | ICD-10-CM

## 2022-12-09 DIAGNOSIS — I700 Atherosclerosis of aorta: Secondary | ICD-10-CM

## 2022-12-09 DIAGNOSIS — E041 Nontoxic single thyroid nodule: Secondary | ICD-10-CM | POA: Diagnosis not present

## 2022-12-09 DIAGNOSIS — J432 Centrilobular emphysema: Secondary | ICD-10-CM | POA: Diagnosis not present

## 2022-12-09 DIAGNOSIS — K219 Gastro-esophageal reflux disease without esophagitis: Secondary | ICD-10-CM | POA: Diagnosis not present

## 2022-12-09 DIAGNOSIS — F419 Anxiety disorder, unspecified: Secondary | ICD-10-CM

## 2022-12-09 DIAGNOSIS — R4586 Emotional lability: Secondary | ICD-10-CM

## 2022-12-09 DIAGNOSIS — E782 Mixed hyperlipidemia: Secondary | ICD-10-CM | POA: Diagnosis not present

## 2022-12-09 DIAGNOSIS — Z1331 Encounter for screening for depression: Secondary | ICD-10-CM

## 2022-12-09 DIAGNOSIS — E1159 Type 2 diabetes mellitus with other circulatory complications: Secondary | ICD-10-CM | POA: Diagnosis not present

## 2022-12-09 HISTORY — DX: Nontoxic single thyroid nodule: E04.1

## 2022-12-09 NOTE — Assessment & Plan Note (Signed)
Very well-controlled on metformin 500 mg only once daily.  She has been on this for years but is having concerns about the medication side effects which she read about.  Her glycemic control is so good that she can do trial of going off metformin and we can recheck her A1c in 3 months

## 2022-12-09 NOTE — Assessment & Plan Note (Signed)
Her GI symptoms are currently well-controlled no change in her appetite or further weight loss.  She is not using PPI medications

## 2022-12-09 NOTE — Assessment & Plan Note (Signed)
Blood pressure at goal today, well-controlled, continue lisinopril 5 mg daily

## 2022-12-09 NOTE — Assessment & Plan Note (Signed)
No current symptoms she is doing lung CT monitoring

## 2022-12-09 NOTE — Assessment & Plan Note (Signed)
Thyroid ultrasound monitoring

## 2022-12-09 NOTE — Assessment & Plan Note (Signed)
On statin, monitoring

## 2022-12-09 NOTE — Progress Notes (Signed)
Name: Felicia Cisneros   MRN: [id number]    DOB: [birth year]   Date:12/09/2022       Progress Note  Chief Complaint  Patient presents with   Follow-up   Diabetes   Hyperlipidemia   Hypertension     Subjective:   Felicia Cisneros is a 72 y.o. female, presents to clinic for f/up on chronic conditions Of note she has a positive depression screening today - much higher than baseline screening  DM:   Pt managing DM with metformin 500 mg once daily- very well controlled - she is worried about metformin SE but she has been on it for years Denies: Polyuria, polydipsia, vision changes, neuropathy, hypoglycemia Recent pertinent labs: Lab Results  Component Value Date   HGBA1C 5.9 (H) 02/25/2022   HGBA1C 5.9 (H) 08/28/2021   HGBA1C 5.8 (H) 02/17/2021   Lab Results  Component Value Date   MICROALBUR 0.5 08/25/2020   LDLCALC 40 12/04/2021   CREATININE 0.69 08/19/2022   Standard of care and health maintenance: Urine Microalbumin:  due today Foot exam:  UTD DM eye exam:  UTD ACEI/ARB:  yes Statin:  yes  HTN on lisinopril 5 mg BP Readings from Last 3 Encounters:  12/09/22 118/80  10/12/22 112/68  09/20/22 110/70   HLD on simvastatin - lipids due for recheck Lab Results  Component Value Date   CHOL 111 12/04/2021   HDL 57 12/04/2021   LDLCALC 40 12/04/2021   TRIG 68 12/04/2021   CHOLHDL 1.9 12/04/2021   Mood down - she reports worse mood and energy after holidays, being sick multiple times and the month of Feb is bad because that the month her late husband died, she had a let down trip in Jan with her sister No recent anxiety episodes She still doesn't want to start any daily medications    12/09/2022    8:45 AM 10/12/2022    9:42 AM 09/02/2022    2:56 PM  Depression screen PHQ 2/9  Decreased Interest 3 1 0  Down, Depressed, Hopeless 3 1 0  PHQ - 2 Score 6 2 0  Altered sleeping 3 1 0  Tired, decreased energy 3 1 0  Change in appetite 3 1 0  Feeling bad or failure  about yourself  0 1 0  Trouble concentrating 0 0 0  Moving slowly or fidgety/restless 0 0 0  Suicidal thoughts 0 0 0  PHQ-9 Score 15 6 0  Difficult doing work/chores Somewhat difficult Not difficult at all Not difficult at all        Current Outpatient Medications:    acetaminophen (TYLENOL) 500 MG tablet, Take 1,000 mg by mouth every 6 (six) hours as needed (for pain.)., Disp: , Rfl:    aspirin EC 81 MG tablet, Take 81 mg by mouth daily. Swallow whole., Disp: , Rfl:    Berberine Chloride (BERBERINE HCI PO), Take by mouth., Disp: , Rfl:    BIOTIN PO, Take by mouth., Disp: , Rfl:    Blood Glucose Monitoring Suppl (ONETOUCH VERIO FLEX SYSTEM) w/Device KIT, CHECK BLOOD SUGAR UP TO  TWICE DAILY AS NEEDED, Disp: 1 kit, Rfl: 1   clobetasol cream (TEMOVATE) 0.05 %, Apply to labia twice daily for 3 weeks then twice weekly as directed, Disp: 60 g, Rfl: 2   Coenzyme Q10-Vitamin E (QUNOL ULTRA COQ10) 100-150 MG-UNIT CAPS, , Disp: , Rfl:    Influenza vac split quadrivalent PF (FLUZONE HIGH-DOSE) 0.5 ML injection, TO BE ADMINISTERED BY PHARMACIST FOR  IMMUNIZATION, Disp: , Rfl:    lisinopril (ZESTRIL) 5 MG tablet, Take 1 tablet (5 mg total) by mouth daily. for blood pressure, Disp: 90 tablet, Rfl: 3   Melatonin 2.5 MG CHEW, Chew 2.5 mg by mouth daily as needed., Disp: , Rfl:    metFORMIN (GLUCOPHAGE-XR) 500 MG 24 hr tablet, TAKE 1 TABLET BY MOUTH  DAILY WITH BREAKFAST, Disp: 90 tablet, Rfl: 3   Misc Natural Products (NEURIVA PO), Take by mouth., Disp: , Rfl:    Multiple Vitamins-Minerals (CENTRUM SILVER 50+WOMEN) TABS, Take 1 tablet by mouth daily., Disp: , Rfl:    OneTouch Delica Lancets 99991111 MISC, 1 application by Does not apply route 3 (three) times a week. Dx:E11.9 LON:99 check BS 3x per week, Disp: 100 each, Rfl: 5   simvastatin (ZOCOR) 40 MG tablet, Take 1 tablet (40 mg total) by mouth at bedtime., Disp: 90 tablet, Rfl: 3   Cobalamin Combinations (NEURIVA PLUS PO), Take 1 tablet by mouth daily.,  Disp: , Rfl:    levocetirizine (XYZAL) 5 MG tablet, Take 1 tablet (5 mg total) by mouth every evening. (Patient not taking: Reported on 12/09/2022), Disp: 30 tablet, Rfl: 0   pantoprazole (PROTONIX) 40 MG tablet, TAKE 1 TABLET BY MOUTH DAILY  BEFORE BREAKFAST (Patient not taking: Reported on 12/09/2022), Disp: 100 tablet, Rfl: 2  Current Facility-Administered Medications:    clobetasol ointment (TEMOVATE) 0.05 %, , Topical, BID, Harlin Heys, MD  Patient Active Problem List   Diagnosis Date Noted   Upper respiratory tract infection due to COVID-19 virus 10/12/2022   Restless leg syndrome 06/07/2022   Retinal detachment with single break, right 12/31/2020   GERD without esophagitis 03/06/2019   Benign neoplasm of sigmoid colon    Centrilobular emphysema ([place]) 12/02/2016   Pulmonary nodules/lesions, multiple 12/02/2016   Aortic atherosclerosis ([place]) 07/08/2016   Osteopenia 05/27/2016   Essential hypertension    Type 2 diabetes mellitus, controlled ([place])    Atrophic vaginitis    History of tobacco use    Hyperlipidemia 07/13/2013    Past Surgical History:  Procedure Laterality Date   colonoscopy     COLONOSCOPY WITH PROPOFOL N/A 02/20/2018   Procedure: COLONOSCOPY WITH PROPOFOL;  Surgeon: Lucilla Lame, MD;  Location: [place];  Service: Endoscopy;  Laterality: N/A;  Diabetic   CYSTOSCOPY WITH BIOPSY N/A 01/17/2017   Procedure: CYSTOSCOPY WITH BIOPSY;  Surgeon: Hollice Espy, MD;  Location: [facility];  Service: Urology;  Laterality: N/A;   POLYPECTOMY  02/20/2018   Procedure: POLYPECTOMY;  Surgeon: Lucilla Lame, MD;  Location: [place];  Service: Endoscopy;;   TMJ ARTHROPLASTY  1984    Family History  Problem Relation Age of Onset   Stroke Mother    Hypertension Mother    Hyperlipidemia Mother    Diabetes Mother        pre-diabetic   Hearing loss Mother    Diabetes Brother    Irregular heart beat Brother    COPD Father    Hearing loss Father     Thyroid disease Sister    Stroke Maternal Grandmother    Hyperlipidemia Maternal Grandmother    Hypertension Maternal Grandmother    Cancer Maternal Grandfather        bone   Cancer Sister        cystic fibroid carcinoma   Ulcerative colitis Sister    Heart disease Paternal Uncle    Heart disease Paternal Uncle    Hyperlipidemia Maternal Aunt    Stroke Maternal  Aunt    Breast cancer Neg Hx    Hematuria Neg Hx    Prostate cancer Neg Hx    Renal cancer Neg Hx     Social History   Tobacco Use   Smoking status: Former    Packs/day: 3.00    Years: 45.00    Total pack years: 135.00    Types: Cigarettes    Quit date: 10/25/2009    Years since quitting: 13.1   Smokeless tobacco: Never   Tobacco comments:    Quit before my husband was diagnosed with small cell cancer.  Vaping Use   Vaping Use: Never used  Substance Use Topics   Alcohol use: Not Currently    Comment: Rarely   Drug use: No     No Known Allergies  Health Maintenance  Topic Date Due   Diabetic kidney evaluation - Urine ACR  08/25/2021   HEMOGLOBIN A1C  08/28/2022   COVID-19 Vaccine (7 - 2023-24 season) 12/25/2022 (Originally 09/15/2022)   FOOT EXAM  06/08/2023   OPHTHALMOLOGY EXAM  06/30/2023   Diabetic kidney evaluation - eGFR measurement  08/20/2023   Medicare Annual Wellness (AWV)  09/10/2023   DEXA SCAN  10/07/2023   MAMMOGRAM  10/08/2023   Lung Cancer Screening  10/09/2023   COLONOSCOPY (Pts 45-6yr Insurance coverage will need to be confirmed)  02/21/2028   DTaP/Tdap/Td (4 - Td or Tdap) 07/10/2029   Pneumonia Vaccine 72 Years old  Completed   INFLUENZA VACCINE  Completed   Hepatitis C Screening  Completed   Zoster Vaccines- Shingrix  Completed   HPV VACCINES  Aged Out    Chart Review Today: I personally reviewed active problem list, medication list, allergies, family history, social history, health maintenance, notes from last encounter, lab results, imaging with the patient/caregiver  today.   Review of Systems  Constitutional: Negative.   HENT: Negative.    Eyes: Negative.   Respiratory: Negative.    Cardiovascular: Negative.   Gastrointestinal: Negative.   Endocrine: Negative.   Genitourinary: Negative.   Musculoskeletal: Negative.   Skin: Negative.   Allergic/Immunologic: Negative.   Neurological: Negative.   Hematological: Negative.   Psychiatric/Behavioral: Negative.    All other systems reviewed and are negative.    Objective:   Vitals:   12/09/22 0849  BP: 118/80  Pulse: 94  Resp: 16  Temp: 97.7 F (36.5 C)  TempSrc: Oral  SpO2: 96%  Weight: 166 lb (75.3 kg)  Height: 5' 8"$  (1.727 m)    Body mass index is 25.24 kg/m.  Physical Exam Vitals and nursing note reviewed.  Constitutional:      General: She is not in acute distress.    Appearance: Normal appearance. She is well-developed. She is not ill-appearing, toxic-appearing or diaphoretic.     Comments: Appears tired, NAD  HENT:     Head: Normocephalic and atraumatic.     Nose: Nose normal.  Eyes:     General:        Right eye: No discharge.        Left eye: No discharge.     Conjunctiva/sclera: Conjunctivae normal.  Neck:     Trachea: No tracheal deviation.  Cardiovascular:     Rate and Rhythm: Normal rate and regular rhythm.     Pulses: Normal pulses.     Heart sounds: Normal heart sounds. No murmur heard.    No friction rub. No gallop.  Pulmonary:     Effort: Pulmonary effort is normal. No respiratory distress.  Breath sounds: No stridor.  Musculoskeletal:        General: Normal range of motion.  Skin:    General: Skin is warm and dry.     Findings: No rash.  Neurological:     Mental Status: She is alert.     Motor: No abnormal muscle tone.     Coordination: Coordination normal.  Psychiatric:        Attention and Perception: Attention and perception normal.        Mood and Affect: Mood is depressed. Mood is not anxious.        Speech: Speech normal.         Behavior: Behavior normal. Behavior is cooperative.        Thought Content: Thought content normal. Thought content does not include suicidal ideation.         Assessment & Plan:   Problem List Items Addressed This Visit       Cardiovascular and Mediastinum   Essential hypertension    Blood pressure at goal today, well-controlled, continue lisinopril 5 mg daily      Relevant Orders   COMPLETE METABOLIC PANEL WITH GFR   Aortic atherosclerosis (HCC)    On statin, monitoring      Relevant Orders   COMPLETE METABOLIC PANEL WITH GFR   Lipid panel     Respiratory   Centrilobular emphysema (HCC)    No current symptoms she is doing lung CT monitoring      Relevant Orders   COMPLETE METABOLIC PANEL WITH GFR   CBC with Differential/Platelet     Digestive   GERD without esophagitis    Her GI symptoms are currently well-controlled no change in her appetite or further weight loss.  She is not using PPI medications        Endocrine   Type 2 diabetes mellitus, controlled ([place]) - Primary    Very well-controlled on metformin 500 mg only once daily.  She has been on this for years but is having concerns about the medication side effects which she read about.  Her glycemic control is so good that she can do trial of going off metformin and we can recheck her A1c in 3 months      Relevant Orders   Microalbumin / creatinine urine ratio   Hemoglobin A1c   COMPLETE METABOLIC PANEL WITH GFR   Thyroid nodule    Thyroid ultrasound monitoring      Relevant Orders   TSH     Other   Hyperlipidemia    Compliant with meds, no SE, no myalgias, fatigue or jaundice Due for labs lipid and CMP       Relevant Orders   COMPLETE METABOLIC PANEL WITH GFR   Lipid panel   Other Visit Diagnoses     Encounter for medication monitoring       Relevant Orders   Hemoglobin A1c   COMPLETE METABOLIC PANEL WITH GFR   Lipid panel   TSH   CBC with Differential/Platelet   Mood changes        mood down - she cites several things that affect mood this time of year, close f/up on mood, r/o hypothyroid   Relevant Orders   TSH   Anxiety disorder, unspecified type       no recent big anxiety episodes, still does not wish to start any meds   Relevant Orders   TSH   Positive depression screening       discussed at length today,  pt declines meds and therapy, she is willing to come back in 3 months to recheck        Return for 3 month f/up in office - mood/energy/DM med changes.   [name], PA-C 12/09/22 8:59 AM

## 2022-12-09 NOTE — Assessment & Plan Note (Signed)
Compliant with meds, no SE, no myalgias, fatigue or jaundice Due for labs lipid and CMP

## 2022-12-14 DIAGNOSIS — H33011 Retinal detachment with single break, right eye: Secondary | ICD-10-CM | POA: Diagnosis not present

## 2022-12-14 DIAGNOSIS — H43812 Vitreous degeneration, left eye: Secondary | ICD-10-CM | POA: Diagnosis not present

## 2022-12-14 DIAGNOSIS — H31002 Unspecified chorioretinal scars, left eye: Secondary | ICD-10-CM | POA: Diagnosis not present

## 2022-12-14 LAB — HM DIABETES EYE EXAM

## 2022-12-15 DIAGNOSIS — E1159 Type 2 diabetes mellitus with other circulatory complications: Secondary | ICD-10-CM | POA: Diagnosis not present

## 2022-12-15 DIAGNOSIS — I10 Essential (primary) hypertension: Secondary | ICD-10-CM | POA: Diagnosis not present

## 2022-12-15 DIAGNOSIS — E782 Mixed hyperlipidemia: Secondary | ICD-10-CM | POA: Diagnosis not present

## 2022-12-15 DIAGNOSIS — E041 Nontoxic single thyroid nodule: Secondary | ICD-10-CM | POA: Diagnosis not present

## 2022-12-16 ENCOUNTER — Other Ambulatory Visit: Payer: Self-pay | Admitting: Family Medicine

## 2022-12-16 LAB — CBC WITH DIFFERENTIAL/PLATELET
Absolute Monocytes: 336 cells/uL (ref 200–950)
Basophils Absolute: 38 cells/uL (ref 0–200)
Basophils Relative: 0.8 %
Eosinophils Absolute: 240 cells/uL (ref 15–500)
Eosinophils Relative: 5 %
HCT: 38.5 % (ref 35.0–45.0)
Hemoglobin: 12.6 g/dL (ref 11.7–15.5)
Lymphs Abs: 1142 cells/uL (ref 850–3900)
MCH: 28.4 pg (ref 27.0–33.0)
MCHC: 32.7 g/dL (ref 32.0–36.0)
MCV: 86.9 fL (ref 80.0–100.0)
MPV: 11.1 fL (ref 7.5–12.5)
Monocytes Relative: 7 %
Neutro Abs: 3043 cells/uL (ref 1500–7800)
Neutrophils Relative %: 63.4 %
Platelets: 244 10*3/uL (ref 140–400)
RBC: 4.43 10*6/uL (ref 3.80–5.10)
RDW: 13.3 % (ref 11.0–15.0)
Total Lymphocyte: 23.8 %
WBC: 4.8 10*3/uL (ref 3.8–10.8)

## 2022-12-16 LAB — COMPLETE METABOLIC PANEL WITH GFR
AG Ratio: 1.8 (calc) (ref 1.0–2.5)
ALT: 14 U/L (ref 6–29)
AST: 16 U/L (ref 10–35)
Albumin: 3.8 g/dL (ref 3.6–5.1)
Alkaline phosphatase (APISO): 81 U/L (ref 37–153)
BUN: 18 mg/dL (ref 7–25)
CO2: 29 mmol/L (ref 20–32)
Calcium: 9.2 mg/dL (ref 8.6–10.4)
Chloride: 109 mmol/L (ref 98–110)
Creat: 0.74 mg/dL (ref 0.60–1.00)
Globulin: 2.1 g/dL (calc) (ref 1.9–3.7)
Glucose, Bld: 100 mg/dL — ABNORMAL HIGH (ref 65–99)
Potassium: 5 mmol/L (ref 3.5–5.3)
Sodium: 144 mmol/L (ref 135–146)
Total Bilirubin: 0.5 mg/dL (ref 0.2–1.2)
Total Protein: 5.9 g/dL — ABNORMAL LOW (ref 6.1–8.1)
eGFR: 86 mL/min/{1.73_m2} (ref >=60)

## 2022-12-16 LAB — MICROALBUMIN / CREATININE URINE RATIO
Creatinine, Urine: 113 mg/dL (ref 20–275)
Microalb Creat Ratio: 404 mcg/mg creat — ABNORMAL HIGH (ref <30)
Microalb, Ur: 45.7 mg/dL

## 2022-12-16 LAB — LIPID PANEL
Cholesterol: 136 mg/dL (ref <200)
HDL: 60 mg/dL (ref >=50)
LDL Cholesterol (Calc): 60 mg/dL (calc)
Non-HDL Cholesterol (Calc): 76 mg/dL (calc) (ref <130)
Total CHOL/HDL Ratio: 2.3 (calc) (ref <5.0)
Triglycerides: 82 mg/dL (ref <150)

## 2022-12-16 LAB — TSH: TSH: 3.35 mIU/L (ref 0.40–4.50)

## 2022-12-16 LAB — HEMOGLOBIN A1C
Hgb A1c MFr Bld: 6.1 % of total Hgb — ABNORMAL HIGH (ref <5.7)
Mean Plasma Glucose: 128 mg/dL
eAG (mmol/L): 7.1 mmol/L

## 2022-12-23 ENCOUNTER — Encounter: Payer: Self-pay | Admitting: Family Medicine

## 2022-12-24 ENCOUNTER — Ambulatory Visit: Payer: Medicare Other | Admitting: Family Medicine

## 2022-12-27 ENCOUNTER — Encounter: Payer: Self-pay | Admitting: Physician Assistant

## 2022-12-27 ENCOUNTER — Ambulatory Visit (INDEPENDENT_AMBULATORY_CARE_PROVIDER_SITE_OTHER): Payer: Medicare Other | Admitting: Physician Assistant

## 2022-12-27 VITALS — BP 120/76 | HR 84 | Temp 98.2°F | Resp 16 | Ht 68.0 in | Wt 167.4 lb

## 2022-12-27 DIAGNOSIS — M25512 Pain in left shoulder: Secondary | ICD-10-CM

## 2022-12-27 NOTE — Patient Instructions (Addendum)
I believe you may have an injury to the biceps muscles or tendons in your left arm/ shoulder joint. I recommend the following at this time to help relieve that discomfort:  Rest Warm compresses to the area (20 minutes on, minimum of 30 minutes off) You can alternate Tylenol and Ibuprofen for pain management but Ibuprofen is typically preferred to reduce inflammation.  Gentle stretches and exercises that I have included in your paperwork Try to reduce excess strain to the area and rest as much as possible     If these measures do not lead to improvement in your symptoms over the next 2-4 weeks please let us know

## 2022-12-27 NOTE — Progress Notes (Signed)
Acute Office Visit   Patient: Felicia Cisneros   DOB: 1951/02/23   72 y.o. Female  MRN: [id number] Visit Date: 12/27/2022  Today's healthcare provider: Dani Gobble Cydnee Fuquay, PA-C  Introduced myself to the patient as a Journalist, newspaper and provided education on APPs in clinical practice.    Chief Complaint  Patient presents with   Shoulder Pain    Left shoulder onset for months hurts to raise it side ways, declines injuring it   Subjective    HPI HPI     Shoulder Pain    Additional comments: Left shoulder onset for months hurts to raise it side ways, declines injuring it      Last edited by Salomon Fick, CMA on 12/27/2022  8:32 AM.         Left shoulder pain  She is right hand dominant   Onset: gradually became worse  Duration: ongoing for a few months  She denies injuries or trauma to the area  Location: over deltoid and biceps  Radiation:  none  Associated symptoms: denies numbness, tingling, or weakness to lower arm or into chest/back Pain level and character: 8/10 achy in nature and constant when moving Interventions:Tylenol  Alleviating: Tylenol seemed to improve it a bit, holding it still  Aggravating: unable to lift left arm to side (abduction) and carrying pocket book on that side hurts if using shoulder strap   She denies repetitive motions or holding a book or table with bent arm for prolonged periods of time    Medications: Outpatient Medications Prior to Visit  Medication Sig   acetaminophen (TYLENOL) 500 MG tablet Take 1,000 mg by mouth every 6 (six) hours as needed (for pain.).   aspirin EC 81 MG tablet Take 81 mg by mouth daily. Swallow whole.   BIOTIN PO Take by mouth.   Blood Glucose Monitoring Suppl (ONETOUCH VERIO FLEX SYSTEM) w/Device KIT CHECK BLOOD SUGAR UP TO  TWICE DAILY AS NEEDED   clobetasol cream (TEMOVATE) 0.05 % Apply to labia twice daily for 3 weeks then twice weekly as directed   Coenzyme Q10-Vitamin E (QUNOL ULTRA COQ10)  100-150 MG-UNIT CAPS    Influenza vac split quadrivalent PF (FLUZONE HIGH-DOSE) 0.5 ML injection TO BE ADMINISTERED BY PHARMACIST FOR IMMUNIZATION   lisinopril (ZESTRIL) 5 MG tablet Take 1 tablet (5 mg total) by mouth daily. for blood pressure   Melatonin 2.5 MG CHEW Chew 2.5 mg by mouth daily as needed.   Misc Natural Products (NEURIVA PO) Take by mouth.   Multiple Vitamins-Minerals (CENTRUM SILVER 50+WOMEN) TABS Take 1 tablet by mouth daily.   OneTouch Delica Lancets 99991111 MISC 1 application by Does not apply route 3 (three) times a week. Dx:E11.9 LON:99 check BS 3x per week   simvastatin (ZOCOR) 40 MG tablet Take 1 tablet (40 mg total) by mouth at bedtime.   metFORMIN (GLUCOPHAGE-XR) 500 MG 24 hr tablet TAKE 1 TABLET BY MOUTH  DAILY WITH BREAKFAST   Facility-Administered Medications Prior to Visit  Medication Dose Route Frequency Provider   clobetasol ointment (TEMOVATE) 0.05 %   Topical BID Harlin Heys, MD    Review of Systems  Musculoskeletal:  Positive for arthralgias and myalgias.       Objective    BP 120/76   Pulse 84   Temp 98.2 F (36.8 C) (Oral)   Resp 16   Ht '5\' 8"'$  (1.727 m)   Wt 167 lb 6.4 oz (75.9 kg)  SpO2 97%   BMI 25.45 kg/m    Physical Exam Vitals reviewed.  Constitutional:      General: She is awake.     Appearance: Normal appearance. She is well-developed and well-groomed.  HENT:     Head: Normocephalic and atraumatic.  Musculoskeletal:     Right shoulder: Normal. No swelling, deformity, effusion, laceration, tenderness or bony tenderness. Normal range of motion. Normal strength.     Left shoulder: Tenderness present. No swelling, deformity, effusion, laceration or bony tenderness. Decreased range of motion. Normal strength.     Left upper arm: Tenderness present.     Comments: Shoulder ROM: External and internal rotation are intact and symmetrical bilaterally Forward flexion and extension are symmetrical and intact bilaterally Right shoulder  and arm ROM intact with abduction and adduction  Left shoulder - abduction is limited to approx 60 degrees and adduction is intact   Positive drop arm test on the left  Mild tenderness to palpation over anterior biceps on left side   Neurological:     Mental Status: She is alert.  Psychiatric:        Attention and Perception: Attention and perception normal.        Mood and Affect: Mood and affect normal.        Speech: Speech normal.        Behavior: Behavior normal. Behavior is cooperative.       No results found for any visits on 12/27/22.  Assessment & Plan      No follow-ups on file.      Problem List Items Addressed This Visit   None Visit Diagnoses     Left anterior shoulder pain    -  Primary Unsure of chronicity but ongoing and not improving with home measures PE is concerning for reduced abduction and positive drop arm test on left side  Given all other ROM measures are intact and symmetrical to right side- suspect biceps tendon injury at this time Recommend conservative management today- warm compresses, tylenol and Ibuprofen, gentle stretches (provided in AVS) and massage.  Recommend she does her weekly exercises without weights to reduce excess strain  Follow up as needed for persistent or progressing symptoms Discussed going to [facility] for further evaluation and management as needed-discussed that no referral is required for them to see her so she can see them as needed or desired.          No follow-ups on file.   I, Duanna Runk E Wanda Cellucci, PA-C, have reviewed all documentation for this visit. The documentation on 12/27/22 for the exam, diagnosis, procedures, and orders are all accurate and complete.   Talitha Givens, MHS, PA-C [facility]

## 2022-12-28 DIAGNOSIS — M7542 Impingement syndrome of left shoulder: Secondary | ICD-10-CM | POA: Diagnosis not present

## 2023-01-02 ENCOUNTER — Encounter: Payer: Self-pay | Admitting: Family Medicine

## 2023-01-03 MED ORDER — PANTOPRAZOLE SODIUM 40 MG PO TBEC: 40.0000 mg | DELAYED_RELEASE_TABLET | Freq: Every day | ORAL | 2 refills | Status: DC | PRN

## 2023-01-07 ENCOUNTER — Other Ambulatory Visit: Payer: Self-pay | Admitting: Family Medicine

## 2023-01-07 DIAGNOSIS — E1159 Type 2 diabetes mellitus with other circulatory complications: Secondary | ICD-10-CM

## 2023-01-07 DIAGNOSIS — I10 Essential (primary) hypertension: Secondary | ICD-10-CM

## 2023-01-31 ENCOUNTER — Encounter: Payer: Self-pay | Admitting: Family Medicine

## 2023-02-06 ENCOUNTER — Other Ambulatory Visit: Payer: Self-pay | Admitting: Internal Medicine

## 2023-02-06 ENCOUNTER — Encounter: Payer: Self-pay | Admitting: Family Medicine

## 2023-02-06 DIAGNOSIS — E1159 Type 2 diabetes mellitus with other circulatory complications: Secondary | ICD-10-CM

## 2023-02-08 NOTE — Telephone Encounter (Signed)
Requested Prescriptions  Pending Prescriptions Disp Refills   metFORMIN (GLUCOPHAGE-XR) 500 MG 24 hr tablet [Pharmacy Med Name: metFORMIN HCl ER 500 MG Oral Tablet Extended Release 24 Hour] 100 tablet 1    Sig: TAKE 1 TABLET BY MOUTH DAILY  WITH BREAKFAST     Endocrinology:  Diabetes - Biguanides Failed - 02/06/2023 10:35 PM      Failed - B12 Level in normal range and within 720 days    Vitamin B-12  Date Value Ref Range Status  08/28/2021 1,107 (H) 200 - 1,100 pg/mL Final         Passed - Cr in normal range and within 360 days    Creat  Date Value Ref Range Status  12/15/2022 0.74 0.60 - 1.00 mg/dL Final   Creatinine, Urine  Date Value Ref Range Status  12/15/2022 113 20 - 275 mg/dL Final         Passed - HBA1C is between 0 and 7.9 and within 180 days    Hgb A1c MFr Bld  Date Value Ref Range Status  12/15/2022 6.1 (H) <5.7 % of total Hgb Final    Comment:    For someone without known diabetes, a hemoglobin  A1c value between 5.7% and 6.4% is consistent with prediabetes and should be confirmed with a  follow-up test. . For someone with known diabetes, a value <7% indicates that their diabetes is well controlled. A1c targets should be individualized based on duration of diabetes, age, comorbid conditions, and other considerations. . This assay result is consistent with an increased risk of diabetes. . Currently, no consensus exists regarding use of hemoglobin A1c for diagnosis of diabetes for children. .          Passed - eGFR in normal range and within 360 days    GFR, Est African American  Date Value Ref Range Status  02/17/2021 88 > OR = 60 mL/min/1.87m2 Final   GFR, Est Non African American  Date Value Ref Range Status  02/17/2021 76 > OR = 60 mL/min/1.11m2 Final   GFR, Estimated  Date Value Ref Range Status  08/19/2022 >60 >60 mL/min Final    Comment:    (NOTE) Calculated using the CKD-EPI Creatinine Equation (2021)    eGFR  Date Value Ref Range  Status  12/15/2022 86 > OR = 60 mL/min/1.64m2 Final         Passed - Valid encounter within last 6 months    Recent Outpatient Visits           1 month ago Left anterior shoulder pain   [facility] Mecum, Erin E, PA-C   2 months ago Controlled type 2 diabetes mellitus with other circulatory complication, without long-term current use of insulin [facility])   [facility] Danelle Berry, PA-C   3 months ago Upper respiratory tract infection due to COVID-19 virus   [facility] Danelle Berry, PA-C   5 months ago Upper respiratory tract infection, unspecified type   [facility] Danelle Berry, PA-C   5 months ago Acute cough   [facility] Margarita Mail, DO       Future Appointments             In 1 month Danelle Berry, PA-C [facility] [facility]            Passed - CBC within normal limits and completed in the last 12 months  WBC  Date Value Ref Range Status  12/15/2022 4.8 3.8 - 10.8 Thousand/uL Final   RBC  Date Value Ref Range Status  12/15/2022 4.43 3.80 - 5.10 Million/uL Final   Hemoglobin  Date Value Ref Range Status  12/15/2022 12.6 11.7 - 15.5 g/dL Final  11/91/4782 95.6 11.1 - 15.9 g/dL Final   HCT  Date Value Ref Range Status  12/15/2022 38.5 35.0 - 45.0 % Final   Hematocrit  Date Value Ref Range Status  08/22/2015 42.4 34.0 - 46.6 % Final   MCHC  Date Value Ref Range Status  12/15/2022 32.7 32.0 - 36.0 g/dL Final   [facility]  Date Value Ref Range Status  12/15/2022 28.4 27.0 - 33.0 pg Final   MCV  Date Value Ref Range Status  12/15/2022 86.9 80.0 - 100.0 fL Final  08/22/2015 90 79 - 97 fL Final  07/10/2013 90 80 - 100 fL Final   No results found for: "PLTCOUNTKUC", "LABPLAT", "POCPLA" RDW  Date Value Ref Range Status  12/15/2022 13.3 11.0 - 15.0 % Final  08/22/2015 13.3 12.3 - 15.4 % Final   07/10/2013 13.7 11.5 - 14.5 % Final

## 2023-03-17 ENCOUNTER — Ambulatory Visit: Payer: Medicare Other | Admitting: Family Medicine

## 2023-03-24 ENCOUNTER — Ambulatory Visit: Payer: Medicare Other | Admitting: Family Medicine

## 2023-03-24 DIAGNOSIS — R4586 Emotional lability: Secondary | ICD-10-CM

## 2023-03-24 DIAGNOSIS — E1159 Type 2 diabetes mellitus with other circulatory complications: Secondary | ICD-10-CM

## 2023-03-28 ENCOUNTER — Encounter: Payer: Self-pay | Admitting: Family Medicine

## 2023-03-28 ENCOUNTER — Ambulatory Visit (INDEPENDENT_AMBULATORY_CARE_PROVIDER_SITE_OTHER): Payer: Medicare Other | Admitting: Family Medicine

## 2023-03-28 VITALS — BP 128/82 | HR 86 | Temp 97.7°F | Resp 16 | Ht 68.0 in | Wt 172.3 lb

## 2023-03-28 DIAGNOSIS — K219 Gastro-esophageal reflux disease without esophagitis: Secondary | ICD-10-CM

## 2023-03-28 DIAGNOSIS — E1159 Type 2 diabetes mellitus with other circulatory complications: Secondary | ICD-10-CM

## 2023-03-28 DIAGNOSIS — R4586 Emotional lability: Secondary | ICD-10-CM

## 2023-03-28 DIAGNOSIS — Z7984 Long term (current) use of oral hypoglycemic drugs: Secondary | ICD-10-CM | POA: Diagnosis not present

## 2023-03-28 DIAGNOSIS — I10 Essential (primary) hypertension: Secondary | ICD-10-CM | POA: Diagnosis not present

## 2023-03-28 DIAGNOSIS — F419 Anxiety disorder, unspecified: Secondary | ICD-10-CM

## 2023-03-28 DIAGNOSIS — R918 Other nonspecific abnormal finding of lung field: Secondary | ICD-10-CM

## 2023-03-28 DIAGNOSIS — Z5181 Encounter for therapeutic drug level monitoring: Secondary | ICD-10-CM | POA: Diagnosis not present

## 2023-03-28 NOTE — Assessment & Plan Note (Signed)
Very easily triggered by foods, using tums often and intermittently using doses of PPI, she endorses years of symptoms, I have explained to her that long term sx and need for meds pt should have a GI consult and an EGD, she declines at this time

## 2023-03-28 NOTE — Assessment & Plan Note (Signed)
  Well controlled, at last OV we d/c metformin to simplify management, she was encouraged to decrease sugar checks and diet changes based on morning sugars since this seemed to cause unneccesary stress and she has been well controlled, recheck labs today, expect slight A1C increase - reviewed with pt that 7.0 and under is well controlled and she may only want to restart metformin if over 7.0 Recent pertinent labs: Lab Results  Component Value Date   HGBA1C 6.1 (H) 12/15/2022   HGBA1C 5.9 (H) 02/25/2022   HGBA1C 5.9 (H) 08/28/2021

## 2023-03-28 NOTE — Assessment & Plan Note (Signed)
F/up CT scan is scheduled, no pulm sx right now

## 2023-03-28 NOTE — Assessment & Plan Note (Signed)
BP still well controlled and at goal  BP Readings from Last 3 Encounters:  03/28/23 128/82  12/27/22 120/76  12/09/22 118/80  Continue lisinopril

## 2023-03-28 NOTE — Progress Notes (Signed)
Name: Felicia Cisneros   MRN: [id number]    DOB: 1951-02-17   Date:03/28/2023       Progress Note  Chief Complaint  Patient presents with   Follow-up   Anxiety   Diabetes     Subjective:   Felicia Cisneros is a 72 y.o. female, presents to clinic for routine f/up on chronic conditions  GERD - spicy food flare up GI sx, using tums and then protonix prn   T2DM well controlled, pt was overly worried about sugars and diet changes, so last labs were good and advised pt to hold metformin Lab Results  Component Value Date   HGBA1C 6.1 (H) 12/15/2022    Simvastatin lipids well controlled Lab Results  Component Value Date   CHOL 136 12/15/2022   HDL 60 12/15/2022   LDLCALC 60 12/15/2022   TRIG 82 12/15/2022   CHOLHDL 2.3 12/15/2022   Hypertension:  Currently managed on lisinopril 5 mg  Pt reports good med compliance and denies any SE.   Blood pressure today is well controlled. BP Readings from Last 3 Encounters:  03/28/23 128/82  12/27/22 120/76  12/09/22 118/80   Pt denies CP, SOB, exertional sx, LE edema, palpitation, Ha's, visual disturbances, lightheadedness, hypotension, syncope.   Mood/anxiety phq is normal today, anxiety she has severe episodes, but very rarely - not feeling like she needs any daily medications    03/28/2023    9:32 AM 12/27/2022    8:26 AM 12/09/2022    8:45 AM  Depression screen PHQ 2/9  Decreased Interest 0 0 3  Down, Depressed, Hopeless 0 0 3  PHQ - 2 Score 0 0 6  Altered sleeping 0 0 3  Tired, decreased energy 0 0 3  Change in appetite 0 0 3  Feeling bad or failure about yourself  0 0 0  Trouble concentrating 0 0 0  Moving slowly or fidgety/restless 0 0 0  Suicidal thoughts 0 0 0  PHQ-9 Score 0 0 15  Difficult doing work/chores Not difficult at all Not difficult at all Somewhat difficult      03/28/2023    9:37 AM 12/27/2022    8:27 AM 12/09/2022    8:57 AM 10/12/2022   10:18 AM  GAD 7 : Generalized Anxiety Score  Nervous, Anxious, on  Edge 0 0 1 0  Control/stop worrying 0 0 1 1  Worry too much - different things 0 0 1 1  Trouble relaxing 0 0 0 1  Restless 0 0 0 0  Easily annoyed or irritable 0 0 0 0  Afraid - awful might happen 0 0 0 0  Total GAD 7 Score 0 0 3 3  Anxiety Difficulty Not difficult at all Not difficult at all Somewhat difficult Not difficult at all        Current Outpatient Medications:    acetaminophen (TYLENOL) 500 MG tablet, Take 1,000 mg by mouth every 6 (six) hours as needed (for pain.)., Disp: , Rfl:    aspirin EC 81 MG tablet, Take 81 mg by mouth daily. Swallow whole., Disp: , Rfl:    BIOTIN PO, Take by mouth., Disp: , Rfl:    clobetasol cream (TEMOVATE) 0.05 %, Apply to labia twice daily for 3 weeks then twice weekly as directed, Disp: 60 g, Rfl: 2   Coenzyme Q10-Vitamin E (QUNOL ULTRA COQ10) 100-150 MG-UNIT CAPS, , Disp: , Rfl:    Influenza vac split quadrivalent PF (FLUZONE HIGH-DOSE) 0.5 ML injection, TO BE ADMINISTERED BY  PHARMACIST FOR IMMUNIZATION, Disp: , Rfl:    lisinopril (ZESTRIL) 5 MG tablet, TAKE 1 TABLET BY MOUTH DAILY FOR BLOOD PRESSURE, Disp: 100 tablet, Rfl: 2   Melatonin 2.5 MG CHEW, Chew 2.5 mg by mouth daily as needed., Disp: , Rfl:    meloxicam (MOBIC) 15 MG tablet, Take 1 tablet by mouth daily., Disp: , Rfl:    Misc Natural Products (NEURIVA PO), Take by mouth., Disp: , Rfl:    Multiple Vitamins-Minerals (CENTRUM SILVER 50+WOMEN) TABS, Take 1 tablet by mouth daily., Disp: , Rfl:    simvastatin (ZOCOR) 40 MG tablet, Take 1 tablet (40 mg total) by mouth at bedtime., Disp: 90 tablet, Rfl: 3   Blood Glucose Monitoring Suppl (ONETOUCH VERIO FLEX SYSTEM) w/Device KIT, CHECK BLOOD SUGAR UP TO  TWICE DAILY AS NEEDED, Disp: 1 kit, Rfl: 1   metFORMIN (GLUCOPHAGE-XR) 500 MG 24 hr tablet, TAKE 1 TABLET BY MOUTH DAILY  WITH BREAKFAST, Disp: 100 tablet, Rfl: 1   OneTouch Delica Lancets 30G MISC, 1 application by Does not apply route 3 (three) times a week. Dx:E11.9 LON:99 check BS 3x per  week, Disp: 100 each, Rfl: 5   pantoprazole (PROTONIX) 40 MG tablet, Take 1 tablet (40 mg total) by mouth daily as needed (take for 2 to 4 weeks at a time for GI sx - bloating, reflux, indigestion, early fullness)., Disp: 90 tablet, Rfl: 2  Current Facility-Administered Medications:    clobetasol ointment (TEMOVATE) 0.05 %, , Topical, BID, Linzie Collin, MD  Patient Active Problem List   Diagnosis Date Noted   Thyroid nodule 12/09/2022   Upper respiratory tract infection due to COVID-19 virus 10/12/2022   Retinal detachment with single break, right 12/31/2020   GERD without esophagitis 03/06/2019   Benign neoplasm of sigmoid colon    Centrilobular emphysema (HCC) 12/02/2016   Pulmonary nodules/lesions, multiple 12/02/2016   Aortic atherosclerosis (HCC) 07/08/2016   Osteopenia 05/27/2016   Essential hypertension    Type 2 diabetes mellitus, controlled (HCC)    Atrophic vaginitis    History of tobacco use    Hyperlipidemia 07/13/2013    Past Surgical History:  Procedure Laterality Date   colonoscopy     COLONOSCOPY WITH PROPOFOL N/A 02/20/2018   Procedure: COLONOSCOPY WITH PROPOFOL;  Surgeon: Midge Minium, MD;  Location: [facility];  Service: Endoscopy;  Laterality: N/A;  Diabetic   CYSTOSCOPY WITH BIOPSY N/A 01/17/2017   Procedure: CYSTOSCOPY WITH BIOPSY;  Surgeon: Vanna Scotland, MD;  Location: [facility];  Service: Urology;  Laterality: N/A;   POLYPECTOMY  02/20/2018   Procedure: POLYPECTOMY;  Surgeon: Midge Minium, MD;  Location: [facility];  Service: Endoscopy;;   TMJ ARTHROPLASTY  1984    Family History  Problem Relation Age of Onset   Stroke Mother    Hypertension Mother    Hyperlipidemia Mother    Diabetes Mother        pre-diabetic   Hearing loss Mother    Diabetes Brother    Irregular heart beat Brother    COPD Father    Hearing loss Father    Thyroid disease Sister    Stroke Maternal Grandmother    Hyperlipidemia Maternal Grandmother     Hypertension Maternal Grandmother    Cancer Maternal Grandfather        bone   Cancer Sister        cystic fibroid carcinoma   Ulcerative colitis Sister    Heart disease Paternal Uncle    Heart disease Paternal Uncle  Hyperlipidemia Maternal Aunt    Stroke Maternal Aunt    Breast cancer Neg Hx    Hematuria Neg Hx    Prostate cancer Neg Hx    Renal cancer Neg Hx     Social History   Tobacco Use   Smoking status: Former    Packs/day: 3.00    Years: 45.00    Additional pack years: 0.00    Total pack years: 135.00    Types: Cigarettes    Quit date: 10/25/2009    Years since quitting: 13.4   Smokeless tobacco: Never   Tobacco comments:    Quit before my husband was diagnosed with small cell cancer.  Vaping Use   Vaping Use: Never used  Substance Use Topics   Alcohol use: Not Currently    Comment: Rarely   Drug use: No     No Known Allergies  Health Maintenance  Topic Date Due   COVID-19 Vaccine (8 - 2023-24 season) 04/13/2023 (Originally 03/02/2023)   INFLUENZA VACCINE  05/26/2023   FOOT EXAM  06/08/2023   HEMOGLOBIN A1C  06/15/2023   Medicare Annual Wellness (AWV)  09/10/2023   DEXA SCAN  10/07/2023   MAMMOGRAM  10/08/2023   Lung Cancer Screening  10/09/2023   OPHTHALMOLOGY EXAM  12/15/2023   Diabetic kidney evaluation - eGFR measurement  12/16/2023   Diabetic kidney evaluation - Urine ACR  12/16/2023   Colonoscopy  02/21/2028   DTaP/Tdap/Td (4 - Td or Tdap) 07/10/2029   Pneumonia Vaccine 3+ Years old  Completed   Hepatitis C Screening  Completed   Zoster Vaccines- Shingrix  Completed   HPV VACCINES  Aged Out    Chart Review Today: I personally reviewed active problem list, medication list, allergies, family history, social history, health maintenance, notes from last encounter, lab results, imaging with the patient/caregiver today.   Review of Systems  Constitutional: Negative.   HENT: Negative.    Eyes: Negative.   Respiratory: Negative.     Cardiovascular: Negative.   Gastrointestinal: Negative.   Endocrine: Negative.   Genitourinary: Negative.   Musculoskeletal: Negative.   Skin: Negative.   Allergic/Immunologic: Negative.   Neurological: Negative.   Hematological: Negative.   Psychiatric/Behavioral: Negative.    All other systems reviewed and are negative.    Objective:   Vitals:   03/28/23 0932  BP: 128/82  Pulse: 86  Resp: 16  Temp: 97.7 F (36.5 C)  TempSrc: Oral  SpO2: 98%  Weight: 172 lb 4.8 oz (78.2 kg)  Height: 5\' 8"  (1.727 m)    Body mass index is 26.2 kg/m.  Physical Exam Vitals and nursing note reviewed.  Constitutional:      General: She is not in acute distress.    Appearance: Normal appearance. She is well-developed, well-groomed and normal weight. She is not ill-appearing, toxic-appearing or diaphoretic.  HENT:     Head: Normocephalic and atraumatic.     Right Ear: External ear normal.     Left Ear: External ear normal.     Nose: Nose normal.     Mouth/Throat:     Mouth: Mucous membranes are moist.  Eyes:     General: Lids are normal. No scleral icterus.       Right eye: No discharge.        Left eye: No discharge.     Conjunctiva/sclera: Conjunctivae normal.  Neck:     Trachea: Phonation normal. No tracheal deviation.  Cardiovascular:     Rate and Rhythm: Normal rate and regular  rhythm.     Chest Wall: PMI is not displaced.     Pulses: Normal pulses.          Radial pulses are 2+ on the right side and 2+ on the left side.       Posterior tibial pulses are 2+ on the right side and 2+ on the left side.     Heart sounds: Normal heart sounds. No murmur heard.    No friction rub. No gallop.     Comments: Non-pitting pedal edema bilaterally Pulmonary:     Effort: Pulmonary effort is normal. No respiratory distress.     Breath sounds: Normal breath sounds. No stridor. No wheezing, rhonchi or rales.  Chest:     Chest wall: No tenderness.  Abdominal:     General: Bowel sounds  are normal. There is no distension.     Palpations: Abdomen is soft.     Tenderness: There is no abdominal tenderness.  Musculoskeletal:        General: No deformity. Normal range of motion.     Cervical back: Normal range of motion.     Right lower leg: No edema.     Left lower leg: No edema.  Skin:    General: Skin is warm and dry.     Capillary Refill: Capillary refill takes less than 2 seconds.     Coloration: Skin is not jaundiced (left upper arm) or pale.     Findings: Bruising present. No rash.  Neurological:     Mental Status: She is alert. Mental status is at baseline.     Motor: No abnormal muscle tone.     Gait: Gait normal.  Psychiatric:        Mood and Affect: Mood normal.        Speech: Speech normal.        Behavior: Behavior normal. Behavior is cooperative.         Assessment & Plan:   Problem List Items Addressed This Visit       Cardiovascular and Mediastinum   Essential hypertension    BP still well controlled and at goal  BP Readings from Last 3 Encounters:  03/28/23 128/82  12/27/22 120/76  12/09/22 118/80  Continue lisinopril          Respiratory   Pulmonary nodules/lesions, multiple    F/up CT scan is scheduled, no pulm sx right now        Digestive   GERD without esophagitis    Very easily triggered by foods, using tums often and intermittently using doses of PPI, she endorses years of symptoms, I have explained to her that long term sx and need for meds pt should have a GI consult and an EGD, she declines at this time        Endocrine   Type 2 diabetes mellitus, controlled (HCC) - Primary     Well controlled, at last OV we d/c metformin to simplify management, she was encouraged to decrease sugar checks and diet changes based on morning sugars since this seemed to cause unneccesary stress and she has been well controlled, recheck labs today, expect slight A1C increase - reviewed with pt that 7.0 and under is well controlled and she  may only want to restart metformin if over 7.0 Recent pertinent labs: Lab Results  Component Value Date   HGBA1C 6.1 (H) 12/15/2022   HGBA1C 5.9 (H) 02/25/2022   HGBA1C 5.9 (H) 08/28/2021  Relevant Orders   COMPLETE METABOLIC PANEL WITH GFR   Hemoglobin A1c   Other Visit Diagnoses     Encounter for medication monitoring       Relevant Orders   COMPLETE METABOLIC PANEL WITH GFR   Hemoglobin A1c   Mood changes       pts mood is much better, closer to her baseline phq and gad 7 reviewed and neg today   Anxiety disorder, unspecified type       no recent anxiety attacks, feels good       F/up appt depending on A1C result 3-6 month f/up appt - we will determine once labs result  Danelle Berry, PA-C 03/28/23 10:00 AM

## 2023-03-29 LAB — COMPLETE METABOLIC PANEL WITH GFR
AG Ratio: 2.2 (calc) (ref 1.0–2.5)
ALT: 13 U/L (ref 6–29)
AST: 15 U/L (ref 10–35)
Albumin: 4.1 g/dL (ref 3.6–5.1)
Alkaline phosphatase (APISO): 86 U/L (ref 37–153)
BUN/Creatinine Ratio: 38 (calc) — ABNORMAL HIGH (ref 6–22)
BUN: 26 mg/dL — ABNORMAL HIGH (ref 7–25)
CO2: 27 mmol/L (ref 20–32)
Calcium: 9.1 mg/dL (ref 8.6–10.4)
Chloride: 109 mmol/L (ref 98–110)
Creat: 0.69 mg/dL (ref 0.60–1.00)
Globulin: 1.9 g/dL (calc) (ref 1.9–3.7)
Glucose, Bld: 90 mg/dL (ref 65–99)
Potassium: 5.2 mmol/L (ref 3.5–5.3)
Sodium: 144 mmol/L (ref 135–146)
Total Bilirubin: 0.5 mg/dL (ref 0.2–1.2)
Total Protein: 6 g/dL — ABNORMAL LOW (ref 6.1–8.1)
eGFR: 92 mL/min/{1.73_m2} (ref >=60)

## 2023-03-29 LAB — HEMOGLOBIN A1C
Hgb A1c MFr Bld: 6.3 % of total Hgb — ABNORMAL HIGH (ref <5.7)
Mean Plasma Glucose: 134 mg/dL
eAG (mmol/L): 7.4 mmol/L

## 2023-03-30 ENCOUNTER — Telehealth: Payer: Self-pay | Admitting: Family Medicine

## 2023-03-30 DIAGNOSIS — D2261 Melanocytic nevi of right upper limb, including shoulder: Secondary | ICD-10-CM | POA: Diagnosis not present

## 2023-03-30 DIAGNOSIS — L814 Other melanin hyperpigmentation: Secondary | ICD-10-CM | POA: Diagnosis not present

## 2023-03-30 DIAGNOSIS — D225 Melanocytic nevi of trunk: Secondary | ICD-10-CM | POA: Diagnosis not present

## 2023-03-30 DIAGNOSIS — S20469A Insect bite (nonvenomous) of unspecified back wall of thorax, initial encounter: Secondary | ICD-10-CM | POA: Diagnosis not present

## 2023-03-30 DIAGNOSIS — D2272 Melanocytic nevi of left lower limb, including hip: Secondary | ICD-10-CM | POA: Diagnosis not present

## 2023-03-30 DIAGNOSIS — L738 Other specified follicular disorders: Secondary | ICD-10-CM | POA: Diagnosis not present

## 2023-03-30 DIAGNOSIS — L821 Other seborrheic keratosis: Secondary | ICD-10-CM | POA: Diagnosis not present

## 2023-03-30 DIAGNOSIS — D2262 Melanocytic nevi of left upper limb, including shoulder: Secondary | ICD-10-CM | POA: Diagnosis not present

## 2023-03-30 DIAGNOSIS — D2271 Melanocytic nevi of right lower limb, including hip: Secondary | ICD-10-CM | POA: Diagnosis not present

## 2023-03-30 NOTE — Telephone Encounter (Signed)
Pt. Given lab results and instructions, verbalizes understanding. Follow up appointment made.

## 2023-04-09 ENCOUNTER — Other Ambulatory Visit: Payer: Self-pay | Admitting: Family Medicine

## 2023-04-09 DIAGNOSIS — I700 Atherosclerosis of aorta: Secondary | ICD-10-CM

## 2023-04-09 DIAGNOSIS — E782 Mixed hyperlipidemia: Secondary | ICD-10-CM

## 2023-04-11 ENCOUNTER — Ambulatory Visit: Admission: RE | Admit: 2023-04-11 | Payer: Medicare Other | Source: Ambulatory Visit

## 2023-04-11 ENCOUNTER — Ambulatory Visit
Admission: RE | Admit: 2023-04-11 | Discharge: 2023-04-11 | Disposition: A | Discharge status: Home / Self Care | Payer: Medicare Other | Source: Ambulatory Visit | Attending: Acute Care | Admitting: Acute Care

## 2023-04-11 DIAGNOSIS — R911 Solitary pulmonary nodule: Secondary | ICD-10-CM | POA: Insufficient documentation

## 2023-04-11 DIAGNOSIS — I700 Atherosclerosis of aorta: Secondary | ICD-10-CM | POA: Diagnosis not present

## 2023-04-11 DIAGNOSIS — J439 Emphysema, unspecified: Secondary | ICD-10-CM | POA: Diagnosis not present

## 2023-04-11 DIAGNOSIS — Z87891 Personal history of nicotine dependence: Secondary | ICD-10-CM | POA: Diagnosis not present

## 2023-04-19 ENCOUNTER — Other Ambulatory Visit: Payer: Self-pay

## 2023-04-19 DIAGNOSIS — Z87891 Personal history of nicotine dependence: Secondary | ICD-10-CM

## 2023-04-19 DIAGNOSIS — Z122 Encounter for screening for malignant neoplasm of respiratory organs: Secondary | ICD-10-CM

## 2023-05-12 DIAGNOSIS — Z961 Presence of intraocular lens: Secondary | ICD-10-CM | POA: Diagnosis not present

## 2023-05-12 DIAGNOSIS — H524 Presbyopia: Secondary | ICD-10-CM | POA: Diagnosis not present

## 2023-05-12 DIAGNOSIS — H43813 Vitreous degeneration, bilateral: Secondary | ICD-10-CM | POA: Diagnosis not present

## 2023-05-23 ENCOUNTER — Emergency Department: Payer: Medicare Other

## 2023-05-23 ENCOUNTER — Emergency Department
Admission: EM | Admit: 2023-05-23 | Discharge: 2023-05-23 | Disposition: A | Discharge status: Home / Self Care | Payer: Medicare Other | Source: Home / Self Care | Attending: Emergency Medicine | Admitting: Emergency Medicine

## 2023-05-23 ENCOUNTER — Other Ambulatory Visit: Payer: Self-pay

## 2023-05-23 ENCOUNTER — Encounter: Payer: Self-pay | Admitting: Radiology

## 2023-05-23 DIAGNOSIS — R42 Dizziness and giddiness: Secondary | ICD-10-CM | POA: Insufficient documentation

## 2023-05-23 DIAGNOSIS — R6889 Other general symptoms and signs: Secondary | ICD-10-CM | POA: Diagnosis not present

## 2023-05-23 DIAGNOSIS — R269 Unspecified abnormalities of gait and mobility: Secondary | ICD-10-CM | POA: Diagnosis not present

## 2023-05-23 DIAGNOSIS — I771 Stricture of artery: Secondary | ICD-10-CM | POA: Diagnosis not present

## 2023-05-23 DIAGNOSIS — R404 Transient alteration of awareness: Secondary | ICD-10-CM | POA: Diagnosis not present

## 2023-05-23 DIAGNOSIS — W010XXA Fall on same level from slipping, tripping and stumbling without subsequent striking against object, initial encounter: Secondary | ICD-10-CM | POA: Diagnosis not present

## 2023-05-23 DIAGNOSIS — Z743 Need for continuous supervision: Secondary | ICD-10-CM | POA: Diagnosis not present

## 2023-05-23 DIAGNOSIS — R2689 Other abnormalities of gait and mobility: Secondary | ICD-10-CM | POA: Insufficient documentation

## 2023-05-23 LAB — COMPREHENSIVE METABOLIC PANEL
ALT: 18 U/L (ref 0–44)
AST: 21 U/L (ref 15–41)
Albumin: 4.2 g/dL (ref 3.5–5.0)
Alkaline Phosphatase: 76 U/L (ref 38–126)
Anion gap: 8 (ref 5–15)
BUN: 24 mg/dL — ABNORMAL HIGH (ref 8–23)
CO2: 24 mmol/L (ref 22–32)
Calcium: 8.8 mg/dL — ABNORMAL LOW (ref 8.9–10.3)
Chloride: 108 mmol/L (ref 98–111)
Creatinine, Ser: 0.74 mg/dL (ref 0.44–1.00)
GFR, Estimated: >60 mL/min (ref >60)
Glucose, Bld: 114 mg/dL — ABNORMAL HIGH (ref 70–99)
Potassium: 3.4 mmol/L — ABNORMAL LOW (ref 3.5–5.1)
Sodium: 140 mmol/L (ref 135–145)
Total Bilirubin: 0.7 mg/dL (ref 0.3–1.2)
Total Protein: 6.7 g/dL (ref 6.5–8.1)

## 2023-05-23 LAB — DIFFERENTIAL
Abs Immature Granulocytes: 0.01 10*3/uL (ref 0.00–0.07)
Basophils Absolute: 0.1 10*3/uL (ref 0.0–0.1)
Basophils Relative: 1 %
Eosinophils Absolute: 0.4 10*3/uL (ref 0.0–0.5)
Eosinophils Relative: 7 %
Immature Granulocytes: 0 %
Lymphocytes Relative: 28 %
Lymphs Abs: 1.4 10*3/uL (ref 0.7–4.0)
Monocytes Absolute: 0.3 10*3/uL (ref 0.1–1.0)
Monocytes Relative: 6 %
Neutro Abs: 2.9 10*3/uL (ref 1.7–7.7)
Neutrophils Relative %: 58 %

## 2023-05-23 LAB — CBC
HCT: 43.8 % (ref 36.0–46.0)
Hemoglobin: 14.6 g/dL (ref 12.0–15.0)
MCH: 29.1 pg (ref 26.0–34.0)
MCHC: 33.3 g/dL (ref 30.0–36.0)
MCV: 87.3 fL (ref 80.0–100.0)
Platelets: 195 10*3/uL (ref 150–400)
RBC: 5.02 MIL/uL (ref 3.87–5.11)
RDW: 14.2 % (ref 11.5–15.5)
WBC: 5 10*3/uL (ref 4.0–10.5)
nRBC: 0 % (ref 0.0–0.2)

## 2023-05-23 LAB — PROTIME-INR
INR: 1 (ref 0.8–1.2)
Prothrombin Time: 13.2 seconds (ref 11.4–15.2)

## 2023-05-23 LAB — APTT: aPTT: 27 seconds (ref 24–36)

## 2023-05-23 MED ORDER — MECLIZINE HCL 25 MG PO TABS
25.0000 mg | ORAL_TABLET | Freq: Once | ORAL | Status: AC
  Administered 2023-05-23: 25 mg via ORAL
  Filled 2023-05-23: qty 1

## 2023-05-23 MED ORDER — MECLIZINE HCL 25 MG PO TABS: 25.0000 mg | ORAL_TABLET | Freq: Three times a day (TID) | ORAL | 0 refills | Status: DC | PRN

## 2023-05-23 MED ORDER — GADOBUTROL 1 MMOL/ML IV SOLN
8.0000 mL | Freq: Once | INTRAVENOUS | Status: AC | PRN
  Administered 2023-05-23: 8 mL via INTRAVENOUS

## 2023-05-23 NOTE — ED Provider Notes (Addendum)
[facility] Provider Note    Event Date/Time   First MD Initiated Contact with Patient 05/23/23 [phone]     (approximate)   History   Dizziness   HPI Felicia Cisneros is a 72 y.o. female who presents for evaluation of acute onset imbalance.  She states that she was normal and without difficulty until about 1 AM when she went to bed.  She woke up about 3 hours later at 4 AM to go to the bathroom.  She states that she had a very difficult time ambulating because she kept losing her balance and falling into a wall.  She said that she did not feel like the room or herself were spinning, and she had no nausea nor vomiting, she just could not maintain her balance.  This is never happened before.  She had no difficulty with expressing herself including speaking or understanding anyone.  She does not feel like she is going to pass out.  She has no chest pain, difficulty swallowing, shortness of breath, or infectious symptoms.  He is having no memory difficulties of which she is aware.     Physical Exam   Triage Vital Signs: ED Triage Vitals  Encounter Vitals Group     BP 05/23/23 0558 (!) 159/88     Systolic BP Percentile --      Diastolic BP Percentile --      Pulse Rate 05/23/23 0558 72     Resp 05/23/23 0558 16     Temp 05/23/23 0558 98.3 F (36.8 C)     Temp Source 05/23/23 0558 Oral     SpO2 05/23/23 0558 100 %     Weight 05/23/23 0559 77.1 kg (170 lb)     Height 05/23/23 0559 1.702 m (5\' 7" )     Head Circumference --      Peak Flow --      Pain Score 05/23/23 0558 0     Pain Loc --      Pain Education --      Exclude from Growth Chart --     Most recent vital signs: Vitals:   05/23/23 0558  BP: (!) 159/88  Pulse: 72  Resp: 16  Temp: 98.3 F (36.8 C)  SpO2: 100%    General: Awake, alert, appropriately communicative, no distress.  CV:  Good peripheral perfusion.  Regular rate and rhythm. Resp:  Normal effort. Speaking easily and comfortably,  no accessory muscle usage nor intercostal retractions.   Abd:  No distention.  Other:  Patient has no evidence of any focal neurological deficits.  She has an NIH stroke scale of 0.  No facial droop, dysarthria, aphasia.  No confusion, sharp, making jokes with me, no dysmetria on finger-to-nose testing, but feels that she cannot stand up safely or walk safely.   ED Results / Procedures / Treatments   Labs (all labs ordered are listed, but only abnormal results are displayed) Labs Reviewed  COMPREHENSIVE METABOLIC PANEL - Abnormal; Notable for the following components:      Result Value   Potassium 3.4 (*)    Glucose, Bld 114 (*)    BUN 24 (*)    Calcium 8.8 (*)    All other components within normal limits  PROTIME-INR  APTT  CBC  DIFFERENTIAL     EKG  ED ECG REPORT I, Loleta Rose, the attending physician, personally viewed and interpreted this ECG.  Date: 05/23/2023 EKG Time: 6:12 AM Rate: 73 Rhythm: normal sinus  rhythm QRS Axis: normal Intervals: normal ST/T Wave abnormalities: normal Narrative Interpretation: no evidence of acute ischemia    RADIOLOGY The patient's MR brain, MR angio head, and MR angio neck are all pending at the time of transfer of care to Dr. Derrill Kay   PROCEDURES:  Critical Care performed: No  Procedures    IMPRESSION / MDM / ASSESSMENT AND PLAN / ED COURSE  I reviewed the triage vital signs and the nursing notes.                              Differential diagnosis includes, but is not limited to, CVA, TIA, posterior circulation impairment, electrolyte or metabolic abnormality.  Patient's presentation is most consistent with acute presentation with potential threat to life or bodily function.  Labs/studies ordered: MR brain without contrast, MRA head without contrast, MRA neck with and without contrast, CBC, differential, pro time-INR and APTT to evaluate coagulation/anticoagulation in the setting of possible acute CVA, CMP.  **  Protime-INR and APTT were ordered given concerns about possible CVA (ischemic vs hemorrhagic) and the possible need for anticoagulation or anticoagulation reversal (in the case of hemorrhage).  MR brain and MRA head/neck were ordered to rule out acute ischemic stroke, acute hemorrhagic stroke, and arterial dissection of the head and neck, all of which could case the symptoms the patient described. **  Interventions/Medications given:  Medications  meclizine (ANTIVERT) tablet 25 mg (has no administration in time range)  gadobutrol (GADAVIST) 1 MMOL/ML injection 8 mL (8 mLs Intravenous Contrast Given 05/23/23 0650)    (Note:  hospital course my include additional interventions and/or labs/studies not listed above.)   Patient's last known normal was approximately 5 hours ago.  However, she has an NIH stroke scale of 0 with only symptom being imbalance.  I feel that a CT head would be a very minimal benefit and would only waste time.  I talked with the patient and we agreed to proceed with MRIs/MRAs as able more definitively give [place] a diagnosis even as the TNKase time limit approaches.  Patient's vital signs are stable other than some mild hypertension.  Lab work is reassuring and within normal limits.  Imaging pending.  Clinical Course as of 05/23/23 0747  Mon May 23, 2023  3244 Transferring ED care to Dr. Derrill Kay to follow-up on MRIs and dispo appropriately [CF]  [id number] Viewed and interpreted the patient's MRIs/MRAs.  There is no evidence of acute ischemia nor obstruction of cranial circulation. [CF]  [phone] Patient says she does not feel any better, like she is still off balance but without a rotational component.  We will try dose of meclizine.  Transferring ED care to Dr. Derrill Kay to reassess the patient and see if she feels better. [CF]    Clinical Course User Index [CF] Loleta Rose, MD     FINAL CLINICAL IMPRESSION(S) / ED DIAGNOSES   Final diagnoses:  Imbalance     Rx / DC Orders    ED Discharge Orders     None        Note:  This document was prepared using Dragon voice recognition software and may include unintentional dictation errors.   Loleta Rose, MD 05/23/23 [id number]    Loleta Rose, MD 06/03/23 [phone]

## 2023-05-23 NOTE — ED Notes (Signed)
Pt ambulated around nursing station unassisted, still felt a little dizzy but better after medication. No change in dizziness from laying to walking.

## 2023-05-23 NOTE — ED Triage Notes (Signed)
Pt from home via EMS for dizziness.  Pt went to bed at 0100 woke at 0400.  Pt states she became dizzy while walking to the bathroom and it never stopped.

## 2023-05-23 NOTE — Discharge Instructions (Signed)
Please seek medical attention for any high fevers, chest pain, shortness of breath, change in behavior, persistent vomiting, bloody stool or any other new or concerning symptoms.  

## 2023-05-23 NOTE — ED Provider Notes (Signed)
Patient did feel better after medication.  She was able to ambulate around the department.  Still had some slight dizziness but at this time do not feel it is dangerous for patient to be discharged home.  Discussed with patient concern for inner ear problem.  Will send prescription for meclizine and give ENT follow-up.   Phineas Semen, MD 05/23/23 [phone]

## 2023-05-30 ENCOUNTER — Telehealth: Payer: Self-pay | Admitting: *Deleted

## 2023-05-30 NOTE — Telephone Encounter (Signed)
Transition Care Management Follow-up Telephone Call Date of discharge and from where: [facility] 05/23/2023 How have you been since you were released from the hospital? Feeling much better will see ent dr tomorrow  Any questions or concerns? Yes  Items Reviewed: Did the pt receive and understand the discharge instructions provided? Yes  Medications obtained and verified? No  Other? No  Any new allergies since your discharge? No  Dietary orders reviewed? No Do you have support at home? Yes     Follow up appointments reviewed:  [facility] f/u appt confirmed? Yes  Scheduled to see Ent  on 05/31/2023 Are transportation arrangements needed? No  If their condition worsens, is the pt aware to call PCP or go to the Emergency Dept.? Yes Was the patient provided with contact information for the PCP's office or ED? Yes Was to pt encouraged to call back with questions or concerns? Yes   Alois Cliche -Berneda Rose [facility] [place], Population Health [phone] [address] [place] , [place] [address] Email : [email or web address]

## 2023-05-31 DIAGNOSIS — R42 Dizziness and giddiness: Secondary | ICD-10-CM | POA: Diagnosis not present

## 2023-06-15 ENCOUNTER — Encounter: Payer: Self-pay | Admitting: Nurse Practitioner

## 2023-06-15 ENCOUNTER — Telehealth: Payer: Medicare Other | Admitting: Nurse Practitioner

## 2023-06-15 ENCOUNTER — Ambulatory Visit: Payer: Self-pay

## 2023-06-15 DIAGNOSIS — U071 COVID-19: Secondary | ICD-10-CM

## 2023-06-15 MED ORDER — NIRMATRELVIR/RITONAVIR (PAXLOVID)TABLET
3.0000 | ORAL_TABLET | Freq: Two times a day (BID) | ORAL | 0 refills | Status: AC
Start: 2023-06-15 — End: 2023-06-20

## 2023-06-15 NOTE — Patient Instructions (Addendum)
Hold simvastatin while on Paxlovid medication

## 2023-06-15 NOTE — Progress Notes (Signed)
Virtual Visit Consent   Felicia Cisneros, you are scheduled for a virtual visit with a [facility] provider today. Just as with appointments in the office, your consent must be obtained to participate. Your consent will be active for this visit and any virtual visit you may have with one of our providers in the next 365 days. If you have a MyChart account, a copy of this consent can be sent to you electronically.  As this is a virtual visit, video technology does not allow for your provider to perform a traditional examination. This may limit your provider's ability to fully assess your condition. If your provider identifies any concerns that need to be evaluated in person or the need to arrange testing (such as labs, EKG, etc.), we will make arrangements to do so. Although advances in technology are sophisticated, we cannot ensure that it will always work on either your end or our end. If the connection with a video visit is poor, the visit may have to be switched to a telephone visit. With either a video or telephone visit, we are not always able to ensure that we have a secure connection.  By engaging in this virtual visit, you consent to the provision of healthcare and authorize for your insurance to be billed (if applicable) for the services provided during this visit. Depending on your insurance coverage, you may receive a charge related to this service.  I need to obtain your verbal consent now. Are you willing to proceed with your visit today? Felicia DURRETTE has provided verbal consent on 06/15/2023 for a virtual visit (video or telephone). Viviano Simas, FNP  Date: 06/15/2023 2:23 PM  Virtual Visit via Video Note   I, Viviano Simas, connected with  Felicia Cisneros  ([id number], [birth year]) on 06/15/23 at  2:30 PM EDT by a video-enabled telemedicine application and verified that I am speaking with the correct person using two identifiers.  Location: Patient: Virtual Visit Location Patient:  Home Provider: Virtual Visit Location Provider: Home Office   I discussed the limitations of evaluation and management by telemedicine and the availability of in person appointments. The patient expressed understanding and agreed to proceed.    History of Present Illness: Felicia Cisneros is a 72 y.o. who identifies as a female who was assigned female at birth, and is being seen today after testing positive for COVID-19 today at home.   Symptom onset was three days ago  Two days ago she tested negative  She tested again today and that test was positive   Symptoms include: nasal congestion, sneezing, coughing,   She has had COVID twice in the past  She has taken Paxlovid in the past without SE  She has been vaccinated for COVID including recent booster   Medical history includes HTN  Denies history of asthma  She is in a lung CA study and is followed through CT scans  She does have noted emphysema but requires no treatment and is non symptomatic   GFR > 60 in July of this year    Problems:  Patient Active Problem List   Diagnosis Date Noted   Thyroid nodule 12/09/2022   Upper respiratory tract infection due to COVID-19 virus 10/12/2022   Retinal detachment with single break, right 12/31/2020   GERD without esophagitis 03/06/2019   Benign neoplasm of sigmoid colon    Centrilobular emphysema (HCC) 12/02/2016   Pulmonary nodules/lesions, multiple 12/02/2016   Aortic atherosclerosis (HCC) 07/08/2016   Osteopenia 05/27/2016  Essential hypertension    Type 2 diabetes mellitus, controlled (HCC)    Atrophic vaginitis    History of tobacco use    Hyperlipidemia 07/13/2013    Allergies: No Known Allergies Medications:  Current Outpatient Medications:    acetaminophen (TYLENOL) 500 MG tablet, Take 1,000 mg by mouth every 6 (six) hours as needed (for pain.)., Disp: , Rfl:    aspirin EC 81 MG tablet, Take 81 mg by mouth daily. Swallow whole., Disp: , Rfl:    BIOTIN PO, Take by  mouth., Disp: , Rfl:    Blood Glucose Monitoring Suppl (ONETOUCH VERIO FLEX SYSTEM) w/Device KIT, CHECK BLOOD SUGAR UP TO  TWICE DAILY AS NEEDED, Disp: 1 kit, Rfl: 1   clobetasol cream (TEMOVATE) 0.05 %, Apply to labia twice daily for 3 weeks then twice weekly as directed, Disp: 60 g, Rfl: 2   Coenzyme Q10-Vitamin E (QUNOL ULTRA COQ10) 100-150 MG-UNIT CAPS, , Disp: , Rfl:    Influenza vac split quadrivalent PF (FLUZONE HIGH-DOSE) 0.5 ML injection, TO BE ADMINISTERED BY PHARMACIST FOR IMMUNIZATION, Disp: , Rfl:    lisinopril (ZESTRIL) 5 MG tablet, TAKE 1 TABLET BY MOUTH DAILY FOR BLOOD PRESSURE, Disp: 100 tablet, Rfl: 2   meclizine (ANTIVERT) 25 MG tablet, Take 1 tablet (25 mg total) by mouth 3 (three) times daily as needed for dizziness., Disp: 30 tablet, Rfl: 0   Melatonin 2.5 MG CHEW, Chew 2.5 mg by mouth daily as needed., Disp: , Rfl:    meloxicam (MOBIC) 15 MG tablet, Take 1 tablet by mouth daily., Disp: , Rfl:    metFORMIN (GLUCOPHAGE-XR) 500 MG 24 hr tablet, TAKE 1 TABLET BY MOUTH DAILY  WITH BREAKFAST, Disp: 100 tablet, Rfl: 1   Misc Natural Products (NEURIVA PO), Take by mouth., Disp: , Rfl:    Multiple Vitamins-Minerals (CENTRUM SILVER 50+WOMEN) TABS, Take 1 tablet by mouth daily., Disp: , Rfl:    OneTouch Delica Lancets 30G MISC, 1 application by Does not apply route 3 (three) times a week. Dx:E11.9 LON:99 check BS 3x per week, Disp: 100 each, Rfl: 5   pantoprazole (PROTONIX) 40 MG tablet, Take 1 tablet (40 mg total) by mouth daily as needed (take for 2 to 4 weeks at a time for GI sx - bloating, reflux, indigestion, early fullness)., Disp: 90 tablet, Rfl: 2   simvastatin (ZOCOR) 40 MG tablet, TAKE 1 TABLET BY MOUTH AT  BEDTIME, Disp: 100 tablet, Rfl: 2  Current Facility-Administered Medications:    clobetasol ointment (TEMOVATE) 0.05 %, , Topical, BID, Logan Bores Ellsworth Lennox, MD  Observations/Objective:  Resting comfortably  at home.   No labored breathing.  Speech is clear and coherent  with logical content.  Patient is alert and oriented at baseline.    Assessment and Plan:  1. COVID-19:  Hold Simvastatin while on anti-viral medication   Take anti-viral medication with food  Treat symptoms with over the counter medication as discussed  (Coricidin HBP as needed)   Meds ordered this encounter  Medications   nirmatrelvir/ritonavir (PAXLOVID) 20 x 150 MG & 10 x 100MG  TABS    Sig: Take 3 tablets by mouth 2 (two) times daily for 5 days. (Take nirmatrelvir 150 mg two tablets twice daily for 5 days and ritonavir 100 mg one tablet twice daily for 5 days) Patient GFR is >60    Dispense:  30 tablet    Refill:  0    Isolation precautions per CDC guidelines discussed with patient     Follow Up Instructions:  I discussed the assessment and treatment plan with the patient. The patient was provided an opportunity to ask questions and all were answered. The patient agreed with the plan and demonstrated an understanding of the instructions.  A copy of instructions were sent to the patient via MyChart unless otherwise noted below.    The patient was advised to call back or seek an in-person evaluation if the symptoms worsen or if the condition fails to improve as anticipated.  Time:  I spent 12 minutes with the patient via telehealth technology discussing the above problems/concerns.    Viviano Simas, FNP

## 2023-06-15 NOTE — Telephone Encounter (Signed)
Message from [place] sent at 06/15/2023  1:03 PM EDT  Summary: cold and flu like symptoms / rx req   The patient has tested positive for COVID 19 via an at home test  The patient shares that they have experienced cold like symptoms for roughly 3 days  The patient would like to be prescribed something for their symptoms (runny nose, congestion and sneezing)  Please contact further when possible         Chief Complaint: covid positive- asking for Paxlovid Symptoms: day zero was Sunday Frequency: Sunday Pertinent Negatives: Patient denies SOB or fever Disposition: []ED /[]Urgent Care (no appt availability in office) / []Appointment(In office/virtual)/ [x] [facility]/ []Home Care/ []Refused Recommended Disposition /[][facility]/ [] Follow-up with PCP Additional Notes: no appts in office- appt made for pt virtual  Reason for Disposition  [1] HIGH RISK patient (e.g., weak immune system, age > 64 years, obesity with BMI 30 or higher, pregnant, chronic lung disease or other chronic medical condition) AND [2] COVID symptoms (e.g., cough, fever)  (Exceptions: Already seen by PCP and no new or worsening symptoms.)  Answer Assessment - Initial Assessment Questions 1. COVID-19 DIAGNOSIS: "How do you know that you have COVID?" (e.g., positive lab test or self-test, diagnosed by doctor or NP/PA, symptoms after exposure).     Self test 2. COVID-19 EXPOSURE: "Was there any known exposure to COVID before the symptoms began?" CDC Definition of close contact: within 6 feet (2 meters) for a total of 15 minutes or more over a 24-hour period.      unknown 3. ONSET: "When did the COVID-19 symptoms start?"      Sunday  5. COUGH: "Do you have a cough?" If Yes, ask: "How bad is the cough?"       Mild  6. FEVER: "Do you have a fever?" If Yes, ask: "What is your temperature, how was it measured, and when did it start?"     98 .2 7. RESPIRATORY STATUS: "Describe your breathing?"  (e.g., normal; shortness of breath, wheezing, unable to speak)      no 9. OTHER SYMPTOMS: "Do you have any other symptoms?"  (e.g., chills, fatigue, headache, loss of smell or taste, muscle pain, sore throat)     Sneezing, runny nose,tired 10. HIGH RISK DISEASE: "Do you have any chronic medical problems?" (e.g., asthma, heart or lung disease, weak immune system, obesity, etc.)       yes  Protocols used: Coronavirus (COVID-19) Diagnosed or Suspected-A-AH

## 2023-06-16 ENCOUNTER — Encounter: Payer: Self-pay | Admitting: Family Medicine

## 2023-06-26 ENCOUNTER — Encounter: Payer: Self-pay | Admitting: Family Medicine

## 2023-06-29 ENCOUNTER — Encounter: Payer: Self-pay | Admitting: Family Medicine

## 2023-06-29 ENCOUNTER — Ambulatory Visit (INDEPENDENT_AMBULATORY_CARE_PROVIDER_SITE_OTHER): Payer: Medicare Other | Admitting: Family Medicine

## 2023-06-29 VITALS — BP 114/76 | HR 86 | Temp 98.0°F | Resp 16 | Ht 68.0 in | Wt 175.8 lb

## 2023-06-29 DIAGNOSIS — M25512 Pain in left shoulder: Secondary | ICD-10-CM

## 2023-06-29 DIAGNOSIS — M279 Disease of jaws, unspecified: Secondary | ICD-10-CM | POA: Diagnosis not present

## 2023-06-29 DIAGNOSIS — J069 Acute upper respiratory infection, unspecified: Secondary | ICD-10-CM | POA: Diagnosis not present

## 2023-06-29 DIAGNOSIS — U071 COVID-19: Secondary | ICD-10-CM | POA: Diagnosis not present

## 2023-06-29 NOTE — Progress Notes (Signed)
Patient ID: Felicia Cisneros, female    DOB: January 17, 1951, 72 y.o.   MRN: [id number]  PCP: Danelle Berry, PA-C  Chief Complaint  Patient presents with   Advice Only    Paxlovid   Shoulder Pain    Left onset for a while   Jaw Pain    Subjective:   Felicia Cisneros is a 72 y.o. 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, injections, oral meds, mouth guards  Questions about covid vaccines and also taking paxlovid for 10 days instead of 5 due to the recommendation of a friend regarding her recent covid infection which she has recovered from   Patient Active Problem List   Diagnosis Date Noted   Thyroid nodule 12/09/2022   Upper respiratory tract infection due to COVID-19 virus 10/12/2022   Retinal detachment with single break, right 12/31/2020   GERD without esophagitis 03/06/2019   Benign neoplasm of sigmoid colon    Centrilobular emphysema (HCC) 12/02/2016   Pulmonary nodules/lesions, multiple 12/02/2016   Aortic atherosclerosis (HCC) 07/08/2016   Osteopenia 05/27/2016   Essential hypertension    Type 2 diabetes mellitus, controlled (HCC)    Atrophic vaginitis    History of tobacco use    Hyperlipidemia 07/13/2013      Current Outpatient Medications:    acetaminophen (TYLENOL) 500 MG tablet, Take 1,000 mg by mouth every 6 (six) hours as needed (for pain.)., Disp: , Rfl:    aspirin EC 81 MG tablet, Take 81 mg by mouth daily. Swallow whole., Disp: , Rfl:    BIOTIN PO, Take by mouth., Disp: , Rfl:    clobetasol cream (TEMOVATE) 0.05 %, Apply to labia twice daily for 3 weeks then twice weekly as directed, Disp: 60 g,  Rfl: 2   Coenzyme Q10-Vitamin E (QUNOL ULTRA COQ10) 100-150 MG-UNIT CAPS, , Disp: , Rfl:    Influenza vac split quadrivalent PF (FLUZONE HIGH-DOSE) 0.5 ML injection, TO BE ADMINISTERED BY PHARMACIST FOR IMMUNIZATION, Disp: , Rfl:    lisinopril (ZESTRIL) 5 MG tablet, TAKE 1 TABLET BY MOUTH DAILY FOR BLOOD PRESSURE, Disp: 100 tablet, Rfl: 2   meclizine (ANTIVERT) 25 MG tablet, Take 1 tablet (25 mg total) by mouth 3 (three) times daily as needed for dizziness., Disp: 30 tablet, Rfl: 0   Melatonin 2.5 MG CHEW, Chew 2.5 mg by mouth daily as needed., Disp: , Rfl:    meloxicam (MOBIC) 15 MG tablet, Take 1 tablet by mouth daily., Disp: , Rfl:    Misc Natural Products (NEURIVA PO), Take by mouth., Disp: , Rfl:    Multiple Vitamins-Minerals (CENTRUM SILVER 50+WOMEN) TABS, Take 1 tablet by mouth daily., Disp: , Rfl:    simvastatin (ZOCOR) 40 MG tablet, TAKE 1 TABLET BY MOUTH AT  BEDTIME, Disp: 100 tablet, Rfl: 2   Blood Glucose Monitoring Suppl (ONETOUCH VERIO FLEX SYSTEM) w/Device KIT, CHECK BLOOD SUGAR UP TO  TWICE DAILY AS NEEDED, Disp: 1 kit, Rfl: 1   metFORMIN (GLUCOPHAGE-XR) 500 MG 24 hr tablet, TAKE 1 TABLET BY MOUTH DAILY  WITH BREAKFAST, Disp: 100 tablet, Rfl: 1   OneTouch Delica Lancets  30G MISC, 1 application by Does not apply route 3 (three) times a week. Dx:E11.9 LON:99 check BS 3x per week, Disp: 100 each, Rfl: 5   pantoprazole (PROTONIX) 40 MG tablet, Take 1 tablet (40 mg total) by mouth daily as needed (take for 2 to 4 weeks at a time for GI sx - bloating, reflux, indigestion, early fullness)., Disp: 90 tablet, Rfl: 2  Current Facility-Administered Medications:    clobetasol ointment (TEMOVATE) 0.05 %, , Topical, BID, Logan Bores, Ellsworth Lennox, MD   No Known Allergies   Social History   Tobacco Use   Smoking status: Former    Current packs/day: 0.00    Average packs/day: 3.0 packs/day for 45.0 years (135.0 ttl pk-yrs)    Types: Cigarettes    Start date: 10/25/1964    Quit date: 10/25/2009     Years since quitting: 13.6   Smokeless tobacco: Never   Tobacco comments:    Quit before my husband was diagnosed with small cell cancer.  Vaping Use   Vaping status: Never Used  Substance Use Topics   Alcohol use: Not Currently    Comment: Rarely   Drug use: No      Chart Review Today: I personally reviewed active problem list, medication list, allergies, family history, social history, health maintenance, notes from last encounter, lab results, imaging with the patient/caregiver today.   Review of Systems  Constitutional: Negative.   HENT: Negative.    Eyes: Negative.   Respiratory: Negative.    Cardiovascular: Negative.   Gastrointestinal: Negative.   Endocrine: Negative.   Genitourinary: Negative.   Musculoskeletal: Negative.   Skin: Negative.   Allergic/Immunologic: Negative.   Neurological: Negative.   Hematological: Negative.   Psychiatric/Behavioral: Negative.    All other systems reviewed and are negative.      Objective:   Vitals:   06/29/23 1428  BP: 114/76  Pulse: 86  Resp: 16  Temp: 98 F (36.7 C)  TempSrc: Oral  SpO2: 96%  Weight: 175 lb 12.8 oz (79.7 kg)  Height: 5\' 8"  (1.727 m)    Body mass index is 26.73 kg/m.  Physical Exam Vitals and nursing note reviewed.  Constitutional:      General: She is not in acute distress.    Appearance: Normal appearance. She is well-developed. She is not ill-appearing, toxic-appearing or diaphoretic.  HENT:     Head: Normocephalic and atraumatic.     Nose: Nose normal.  Eyes:     General:        Right eye: No discharge.        Left eye: No discharge.     Conjunctiva/sclera: Conjunctivae normal.  Neck:     Trachea: No tracheal deviation.  Cardiovascular:     Rate and Rhythm: Normal rate and regular rhythm.  Pulmonary:     Effort: Pulmonary effort is normal. No respiratory distress.     Breath sounds: No stridor.  Musculoskeletal:        General: Normal range of motion.  Skin:    General: Skin is  warm and dry.     Findings: No rash.  Neurological:     Mental Status: She is alert.     Motor: No abnormal muscle tone.     Coordination: Coordination normal.  Psychiatric:        Mood and Affect: Mood normal.        Behavior: Behavior normal.      Results for orders placed or performed during the hospital encounter of 05/23/23  Protime-INR  Result Value Ref Range   Prothrombin Time 13.2 11.4 - 15.2 seconds   INR 1.0 0.8 - 1.2  APTT  Result Value Ref Range   aPTT 27 24 - 36 seconds  CBC  Result Value Ref Range   WBC 5.0 4.0 - 10.5 K/uL   RBC 5.02 3.87 - 5.11 MIL/uL   Hemoglobin 14.6 12.0 - 15.0 g/dL   HCT 47.8 29.5 - 62.1 %   MCV 87.3 80.0 - 100.0 fL   MCH 29.1 26.0 - 34.0 pg   MCHC 33.3 30.0 - 36.0 g/dL   RDW 30.8 65.7 - 84.6 %   Platelets 195 150 - 400 K/uL   nRBC 0.0 0.0 - 0.2 %  Differential  Result Value Ref Range   Neutrophils Relative % 58 %   Neutro Abs 2.9 1.7 - 7.7 K/uL   Lymphocytes Relative 28 %   Lymphs Abs 1.4 0.7 - 4.0 K/uL   Monocytes Relative 6 %   Monocytes Absolute 0.3 0.1 - 1.0 K/uL   Eosinophils Relative 7 %   Eosinophils Absolute 0.4 0.0 - 0.5 K/uL   Basophils Relative 1 %   Basophils Absolute 0.1 0.0 - 0.1 K/uL   Immature Granulocytes 0 %   Abs Immature Granulocytes 0.01 0.00 - 0.07 K/uL  Comprehensive metabolic panel  Result Value Ref Range   Sodium 140 135 - 145 mmol/L   Potassium 3.4 (L) 3.5 - 5.1 mmol/L   Chloride 108 98 - 111 mmol/L   CO2 24 22 - 32 mmol/L   Glucose, Bld 114 (H) 70 - 99 mg/dL   BUN 24 (H) 8 - 23 mg/dL   Creatinine, Ser 9.62 0.44 - 1.00 mg/dL   Calcium 8.8 (L) 8.9 - 10.3 mg/dL   Total Protein 6.7 6.5 - 8.1 g/dL   Albumin 4.2 3.5 - 5.0 g/dL   AST 21 15 - 41 U/L   ALT 18 0 - 44 U/L   Alkaline Phosphatase 76 38 - 126 U/L   Total Bilirubin 0.7 0.3 - 1.2 mg/dL   GFR, Estimated >95 >28 mL/min   Anion gap 8 5 - 15       Assessment & Plan:   1. Upper respiratory tract infection due to COVID-19 virus Remote  infection, pt largely recovered, she has several questions about meds and vaccines I have explained that FDA still only recommends paxlovid x 5 d for acute covid infection There is not a updated covid booster available and we are waiting for details and availability  2. 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 Left anterior shoulder pain She was encouraged to f/up with her ortho specialists who recently evaluated and tx her, shot has worn off     Danelle Berry, PA-C 06/29/23 2:45 PM

## 2023-07-13 ENCOUNTER — Ambulatory Visit: Payer: Self-pay

## 2023-07-13 ENCOUNTER — Encounter: Payer: Self-pay | Admitting: Family Medicine

## 2023-07-13 NOTE — Telephone Encounter (Signed)
Reason for Disposition . [1] Chest pain lasts > 5 minutes AND [2] occurred > 3 days ago (72 hours) AND [3] NO chest pain or cardiac symptoms now  Answer Assessment - Initial Assessment Questions 1. LOCATION: "Where does it hurt?"       Right side above breast 2. RADIATION: "Does the pain go anywhere else?" (e.g., into neck, jaw, arms, back)     No 3. ONSET: "When did the chest pain begin?" (Minutes, hours or days)      Friday 4. PATTERN: "Does the pain come and go, or has it been constant since it started?"  "Does it get worse with exertion?"      Constant 5. DURATION: "How long does it last" (e.g., seconds, minutes, hours)     Eases off 6. SEVERITY: "How bad is the pain?"  (e.g., Scale 1-10; mild, moderate, or severe)    - MILD (1-3): doesn't interfere with normal activities     - MODERATE (4-7): interferes with normal activities or awakens from sleep    - SEVERE (8-10): excruciating pain, unable to do any normal activities       6 7. CARDIAC RISK FACTORS: "Do you have any history of heart problems or risk factors for heart disease?" (e.g., angina, prior heart attack; diabetes, high blood pressure, high cholesterol, smoker, or strong family history of heart disease)     No 8. PULMONARY RISK FACTORS: "Do you have any history of lung disease?"  (e.g., blood clots in lung, asthma, emphysema, birth control pills)     No 9. CAUSE: "What do you think is causing the chest pain?"     Ribs 10. OTHER SYMPTOMS: "Do you have any other symptoms?" (e.g., dizziness, nausea, vomiting, sweating, fever, difficulty breathing, cough)       No 11. PREGNANCY: "Is there any chance you are pregnant?" "When was your last menstrual period?"       No  Protocols used: Chest Pain-A-AH

## 2023-07-13 NOTE — Telephone Encounter (Signed)
Lvm to call us back and we will try and sch appt.

## 2023-07-13 NOTE — Telephone Encounter (Signed)
    Chief Complaint: Right side rib pain Symptoms: Above Frequency: Friday Pertinent Negatives: Patient denies  Disposition: [] ED /[x] Urgent Care (no appt availability in office) / [] Appointment(In office/virtual)/ []  [facility]/ [] Home Care/ [] Refused Recommended Disposition /[] [facility]/ []  Follow-up with PCP Additional Notes: No availability today. Pt. Will go to UC.  Reason for Disposition  [1] MODERATE pain (e.g., interferes with normal activities) AND [2] high-risk adult (e.g., age > 60 years, osteoporosis, chronic steroid use)  Answer Assessment - Initial Assessment Questions 1. MECHANISM: "How did the injury happen?"     Lifting a chair 2. ONSET: "When did the injury happen?" (Minutes or hours ago)     Friday 3. LOCATION: "Where on the chest is the injury located?"     Right side at breast 4. APPEARANCE: "What does the injury look like?"     Fine 5. BLEEDING: "Is there any bleeding now? If Yes, ask: How long has it been bleeding?"     No 6. SEVERITY: "Any difficulty with breathing?"     None 7. SIZE: For cuts, bruises, or swelling, ask: "How large is it?" (e.g., inches or centimeters)     N/A 8. PAIN: "Is there pain?" If Yes, ask: "How bad is the pain?"   (e.g., Scale 1-10; or mild, moderate, severe)     N/A 9. TETANUS: For any breaks in the skin, ask: "When was the last tetanus booster?"     N/A 10. PREGNANCY: "Is there any chance you are pregnant?" "When was your last menstrual period?"       nO  Protocols used: Chest Injury-A-AH

## 2023-07-31 ENCOUNTER — Encounter: Payer: Self-pay | Admitting: Family Medicine

## 2023-08-01 ENCOUNTER — Other Ambulatory Visit: Payer: Self-pay

## 2023-08-01 ENCOUNTER — Encounter: Payer: Self-pay | Admitting: Nurse Practitioner

## 2023-08-01 ENCOUNTER — Ambulatory Visit
Admission: RE | Admit: 2023-08-01 | Discharge: 2023-08-01 | Disposition: A | Discharge status: Home / Self Care | Payer: Medicare Other | Attending: Nurse Practitioner | Admitting: Nurse Practitioner

## 2023-08-01 ENCOUNTER — Ambulatory Visit
Admission: RE | Admit: 2023-08-01 | Discharge: 2023-08-01 | Disposition: A | Discharge status: Home / Self Care | Payer: Medicare Other | Source: Ambulatory Visit | Attending: Nurse Practitioner | Admitting: Nurse Practitioner

## 2023-08-01 ENCOUNTER — Ambulatory Visit (INDEPENDENT_AMBULATORY_CARE_PROVIDER_SITE_OTHER): Payer: Medicare Other | Admitting: Nurse Practitioner

## 2023-08-01 VITALS — BP 130/80 | HR 95 | Temp 98.1°F | Resp 16 | Ht 68.0 in | Wt 172.7 lb

## 2023-08-01 DIAGNOSIS — R0781 Pleurodynia: Secondary | ICD-10-CM | POA: Insufficient documentation

## 2023-08-01 MED ORDER — TIZANIDINE HCL 4 MG PO TABS
2.0000 mg | ORAL_TABLET | Freq: Three times a day (TID) | ORAL | 0 refills | Status: DC | PRN
Start: 2023-08-01 — End: 2023-09-21

## 2023-08-01 MED ORDER — MELOXICAM 15 MG PO TABS
15.0000 mg | ORAL_TABLET | Freq: Every day | ORAL | 0 refills | Status: DC
Start: 2023-08-01 — End: 2023-09-29

## 2023-08-01 NOTE — Progress Notes (Signed)
BP 130/80   Pulse 95   Temp 98.1 F (36.7 C) (Oral)   Resp 16   Ht 5\' 8"  (1.727 m)   Wt 172 lb 11.2 oz (78.3 kg)   SpO2 98%   BMI 26.26 kg/m    Subjective:    Patient ID: Felicia Cisneros, female    DOB: 1950/11/03, 72 y.o.   MRN: [id number]  HPI: Felicia Cisneros is a 72 y.o. female  Chief Complaint  Patient presents with   Flank Pain    Right side pain. Was lifting a recliner and felt something pop x 1 month   right side rib pain: patient reports that she has had this nagging pain since trying to move a recliner about a month ago. She says when she went to set the recliner down she heard a pop.  She says she has tried over the counter voltaren gel and tylenol for pain,  but she is still having pain.  She says the pain increases with movement.  She does have some tenderness in the lower right rib area. Will get rib series.  Start meloxicam and tizanidine. Also recommend heat therapy.   Relevant past medical, surgical, family and social history reviewed and updated as indicated. Interim medical history since our last visit reviewed. Allergies and medications reviewed and updated.  Review of Systems  Constitutional: Negative for fever or weight change.  Respiratory: Negative for cough and shortness of breath.   Cardiovascular: Negative for chest pain or palpitations.  Gastrointestinal: Negative for abdominal pain, no bowel changes.  Musculoskeletal: Negative for gait problem or joint swelling. Positive for right rib pain Skin: Negative for rash.  Neurological: Negative for dizziness or headache.  No other specific complaints in a complete review of systems (except as listed in HPI above).      Objective:    BP 130/80   Pulse 95   Temp 98.1 F (36.7 C) (Oral)   Resp 16   Ht 5\' 8"  (1.727 m)   Wt 172 lb 11.2 oz (78.3 kg)   SpO2 98%   BMI 26.26 kg/m   Wt Readings from Last 3 Encounters:  08/01/23 172 lb 11.2 oz (78.3 kg)  06/29/23 175 lb 12.8 oz (79.7 kg)  05/23/23  170 lb (77.1 kg)    Physical Exam  Constitutional: Patient appears well-developed and well-nourished. No distress.  HEENT: head atraumatic, normocephalic, pupils equal and reactive to light, neck supple, throat within normal limits Cardiovascular: Normal rate, regular rhythm and normal heart sounds.  No murmur heard. No BLE edema. Pulmonary/Chest: Effort normal and breath sounds normal. No respiratory distress. Abdominal: Soft.  There is no tenderness. MSK: right lower rib tenderness Psychiatric: Patient has a normal mood and affect. behavior is normal. Judgment and thought content normal.  Results for orders placed or performed during the hospital encounter of 05/23/23  Protime-INR  Result Value Ref Range   Prothrombin Time 13.2 11.4 - 15.2 seconds   INR 1.0 0.8 - 1.2  APTT  Result Value Ref Range   aPTT 27 24 - 36 seconds  CBC  Result Value Ref Range   WBC 5.0 4.0 - 10.5 K/uL   RBC 5.02 3.87 - 5.11 MIL/uL   Hemoglobin 14.6 12.0 - 15.0 g/dL   HCT 40.9 81.1 - 91.4 %   MCV 87.3 80.0 - 100.0 fL   MCH 29.1 26.0 - 34.0 pg   MCHC 33.3 30.0 - 36.0 g/dL   RDW 78.2 95.6 - 21.3 %  Platelets 195 150 - 400 K/uL   nRBC 0.0 0.0 - 0.2 %  Differential  Result Value Ref Range   Neutrophils Relative % 58 %   Neutro Abs 2.9 1.7 - 7.7 K/uL   Lymphocytes Relative 28 %   Lymphs Abs 1.4 0.7 - 4.0 K/uL   Monocytes Relative 6 %   Monocytes Absolute 0.3 0.1 - 1.0 K/uL   Eosinophils Relative 7 %   Eosinophils Absolute 0.4 0.0 - 0.5 K/uL   Basophils Relative 1 %   Basophils Absolute 0.1 0.0 - 0.1 K/uL   Immature Granulocytes 0 %   Abs Immature Granulocytes 0.01 0.00 - 0.07 K/uL  Comprehensive metabolic panel  Result Value Ref Range   Sodium 140 135 - 145 mmol/L   Potassium 3.4 (L) 3.5 - 5.1 mmol/L   Chloride 108 98 - 111 mmol/L   CO2 24 22 - 32 mmol/L   Glucose, Bld 114 (H) 70 - 99 mg/dL   BUN 24 (H) 8 - 23 mg/dL   Creatinine, Ser 7.42 0.44 - 1.00 mg/dL   Calcium 8.8 (L) 8.9 - 10.3 mg/dL    Total Protein 6.7 6.5 - 8.1 g/dL   Albumin 4.2 3.5 - 5.0 g/dL   AST 21 15 - 41 U/L   ALT 18 0 - 44 U/L   Alkaline Phosphatase 76 38 - 126 U/L   Total Bilirubin 0.7 0.3 - 1.2 mg/dL   GFR, Estimated >59 >56 mL/min   Anion gap 8 5 - 15      Assessment & Plan:   Problem List Items Addressed This Visit   None Visit Diagnoses     Rib pain on right side    -  Primary   rib series, heat therapy, tizanadine and meloxicam   Relevant Medications   meloxicam (MOBIC) 15 MG tablet   tiZANidine (ZANAFLEX) 4 MG tablet   Other Relevant Orders   DG Ribs Unilateral Right        Follow up plan: Return if symptoms worsen or fail to improve.

## 2023-08-04 ENCOUNTER — Encounter: Payer: Self-pay | Admitting: Nurse Practitioner

## 2023-08-16 ENCOUNTER — Encounter: Payer: Self-pay | Admitting: Family Medicine

## 2023-08-16 MED ORDER — FAMOTIDINE 20 MG PO TABS: 20.0000 mg | ORAL_TABLET | Freq: Two times a day (BID) | ORAL | 1 refills | Status: DC | PRN

## 2023-08-19 ENCOUNTER — Encounter: Payer: Self-pay | Admitting: Family Medicine

## 2023-08-24 ENCOUNTER — Encounter: Payer: Self-pay | Admitting: Family Medicine

## 2023-08-25 ENCOUNTER — Other Ambulatory Visit: Payer: Self-pay | Admitting: Family Medicine

## 2023-08-25 ENCOUNTER — Encounter: Payer: Self-pay | Admitting: Cardiovascular Disease

## 2023-08-25 DIAGNOSIS — Z1231 Encounter for screening mammogram for malignant neoplasm of breast: Secondary | ICD-10-CM

## 2023-09-17 ENCOUNTER — Encounter: Payer: Self-pay | Admitting: Cardiovascular Disease

## 2023-09-19 ENCOUNTER — Telehealth: Payer: Self-pay | Admitting: Cardiovascular Disease

## 2023-09-19 NOTE — Telephone Encounter (Signed)
Left voicemail message to call back.  

## 2023-09-19 NOTE — Telephone Encounter (Signed)
Pt states that she received a message on her phone and watch on Saturday that she was in A-Fib. Pt states that she feels fine but just concerned. Pt has scheduled 1 yr f/u for this Wednesday with NP. Please advise

## 2023-09-20 NOTE — Telephone Encounter (Signed)
Attempted to contact pt x 3 Left a message for the patient to call back.

## 2023-09-20 NOTE — Telephone Encounter (Signed)
Left voicemail message to call back  

## 2023-09-21 ENCOUNTER — Ambulatory Visit: Payer: Medicare Other | Attending: Student | Admitting: Student

## 2023-09-21 ENCOUNTER — Encounter: Payer: Self-pay | Admitting: Student

## 2023-09-21 VITALS — BP 126/83 | HR 68 | Ht 68.0 in | Wt 176.2 lb

## 2023-09-21 DIAGNOSIS — R600 Localized edema: Secondary | ICD-10-CM | POA: Diagnosis not present

## 2023-09-21 DIAGNOSIS — I10 Essential (primary) hypertension: Secondary | ICD-10-CM | POA: Diagnosis not present

## 2023-09-21 DIAGNOSIS — I4891 Unspecified atrial fibrillation: Secondary | ICD-10-CM | POA: Diagnosis not present

## 2023-09-21 DIAGNOSIS — E785 Hyperlipidemia, unspecified: Secondary | ICD-10-CM

## 2023-09-21 NOTE — Progress Notes (Signed)
Cardiology Clinic Note   Date: 09/21/2023 ID: Felicia Cisneros, DOB Jan 16, 1951, MRN [id number]  Primary Cardiologist:  Julien Nordmann, MD  Patient Profile    Felicia Cisneros is a 72 y.o. female who presents to the clinic today for yearly follow up and to discuss possible episode of afib.     Past medical history significant for: Chest pain. Nuclear stress test 08/18/2018: No significant ischemia.  EF 83%.  No EKG changes concerning for ischemia at peak stress or recovery.  Target heart rate achieved.  Low risk study.  Hypertension.  Hyperlipidemia.  Lipid panel 01/13/2023: LDL 60, HDL 60, TG 82, total 136. GERD.  T2DM.  COPD. History of tobacco abuse.   In summary, patient was first evaluated by Dr. Mariah Milling on 07/13/2013 for abnormal EKG performed in ED.  Patient was evaluated in the ED for allover numbness that occurred while eating along with dizziness that lasted an hour.  Head CT was normal.  Potassium was 3.3, troponin negative.  EKG showed inferior infarct and she was referred to cardiology.  EKG at the time of her visit showed NSR, 74 bpm.  Review of hospital EKG suggested inferior infarct that was felt to likely be artifact or lead placement.  In October 2019 she complained of episode of sharp substernal pain radiating to right lasting about 15 minutes.  She was evaluated in the ED with unremarkable workup.  EKG normal, troponin negative x 2, CTA negative for PE.  She underwent outpatient exercise stress test as above.     History of Present Illness    Felicia Cisneros is followed by Dr. Mariah Milling for the above outlined history.   Patient was last seen in the office by Dr. Mariah Milling on 09/20/2022 for routine follow-up.  She was doing well at that time and no changes were made.  Patient contacted the office on 09/19/2023 with reports that her smart watch indicated she was in A-fib 2 days prior.  It appears triage was unable to contact patient for further information.  She was already  scheduled for routine follow-up.  Today, patient presents alone for yearly follow up and an alert for possible afib on her smart watch. She reports she was woken from sleep with an alert from her watch saying she was in afib. She denies palpitations, shortness of breath, or chest pain. She states she normally does not wear her watch to bed and has not worn it since. She has never been alerted during the day. Family history includes her mom requiring a pacemaker and what sounds like afib in her brother, as she states "he has to get shocked every now and then."  She is otherwise doing well. She has occasional lower extremity edema that is at its best in the morning and progresses throughout the day. She manages it with compression socks. She limits her sodium intake. She typically sits with her legs in a dependent position.       ROS: All other systems reviewed and are otherwise negative except as noted in History of Present Illness.  Studies Reviewed    EKG Interpretation Date/Time:  Wednesday September 21 2023 16:10:59 EST Ventricular Rate:  68 PR Interval:  182 QRS Duration:  74 QT Interval:  422 QTC Calculation: 448 R Axis:   -15  Text Interpretation: Normal sinus rhythm Possible Left atrial enlargement Nonspecific ST and T wave abnormality When compared with ECG of 23-May-2023 06:12, No significant change Confirmed by Carlos Levering [phone]) on 09/21/2023 4:15:05  PM           Physical Exam    VS:  BP 126/83 (BP Location: Left Arm, Patient Position: Sitting, Cuff Size: Normal)   Pulse 68   Ht 5\' 8"  (1.727 m)   Wt 176 lb 3.2 oz (79.9 kg)   SpO2 98%   BMI 26.79 kg/m  , BMI Body mass index is 26.79 kg/m.  GEN: Well nourished, well developed, in no acute distress. Neck: No JVD or carotid bruits. Cardiac:  RRR. No murmurs. No rubs or gallops.   Respiratory:  Respirations regular and unlabored. Clear to auscultation without rales, wheezing or rhonchi. GI: Soft, nontender,  nondistended. Extremities: Radials/DP/PT 2+ and equal bilaterally. No clubbing or cyanosis. Mild non-pitting edema bilateral lower extremities.   Skin: Warm and dry, no rash. Neuro: Strength intact.  Assessment & Plan   ?  A-fib Patient reports receiving an afib alert from her apple watch that woke her from sleep about 1 week ago. She does not usually wear her watch at night and has not since. She has not been alerted during the day. She denies palpitations. EKG shows NSR. RRR on exam today. Discussed options for outpatient monitoring. Patient would like to hold off on ordering an outpatient Zio for now. She will continue to wear her smart watch. If she gets another alert she will contact the office and a Zio can be ordered at that time.  -Consider 2 week Zio if recurrent alerts from smart watch.  -She has a PCP visit next week and will get labs at that time.   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 Mild nonpitting lower extremity edema on exam today.  -Continue current management.   Hypertension BP today 126/83. No report of headaches or dizziness.  -Continue lisinopril.  Hyperlipidemia LDL March 2024 60, at goal. -Continue simvastatin.  Disposition: Return in 6 months or sooner as needed.          Signed, Etta Grandchild. Delores Thelen, DNP, NP-C

## 2023-09-21 NOTE — Patient Instructions (Signed)
Medication Instructions:  - No changes *If you need a refill on your cardiac medications before your next appointment, please call your pharmacy*  Lab Work: - None ordered  Testing/Procedures: - None ordered  Follow-Up: At [facility], you and your health needs are our priority.  As part of our continuing mission to provide you with exceptional heart care, we have created designated Provider Care Teams.  These Care Teams include your primary Cardiologist (physician) and Advanced Practice Providers (APPs -  Physician Assistants and Nurse Practitioners) who all work together to provide you with the care you need, when you need it.  Your next appointment:   6 month(s)  Provider:   Carlos Levering, NP

## 2023-09-22 ENCOUNTER — Other Ambulatory Visit: Payer: Self-pay | Admitting: Family Medicine

## 2023-09-22 ENCOUNTER — Encounter: Payer: Self-pay | Admitting: Physician Assistant

## 2023-09-22 DIAGNOSIS — I10 Essential (primary) hypertension: Secondary | ICD-10-CM

## 2023-09-22 DIAGNOSIS — E1159 Type 2 diabetes mellitus with other circulatory complications: Secondary | ICD-10-CM

## 2023-09-29 ENCOUNTER — Encounter: Payer: Self-pay | Admitting: Physician Assistant

## 2023-09-29 ENCOUNTER — Ambulatory Visit (INDEPENDENT_AMBULATORY_CARE_PROVIDER_SITE_OTHER): Payer: Medicare Other | Admitting: Physician Assistant

## 2023-09-29 VITALS — BP 120/68 | HR 89 | Resp 16 | Ht 68.0 in | Wt 177.0 lb

## 2023-09-29 DIAGNOSIS — M8589 Other specified disorders of bone density and structure, multiple sites: Secondary | ICD-10-CM

## 2023-09-29 DIAGNOSIS — E041 Nontoxic single thyroid nodule: Secondary | ICD-10-CM

## 2023-09-29 DIAGNOSIS — E785 Hyperlipidemia, unspecified: Secondary | ICD-10-CM | POA: Diagnosis not present

## 2023-09-29 DIAGNOSIS — E119 Type 2 diabetes mellitus without complications: Secondary | ICD-10-CM | POA: Diagnosis not present

## 2023-09-29 DIAGNOSIS — I700 Atherosclerosis of aorta: Secondary | ICD-10-CM

## 2023-09-29 DIAGNOSIS — K219 Gastro-esophageal reflux disease without esophagitis: Secondary | ICD-10-CM

## 2023-09-29 DIAGNOSIS — E1159 Type 2 diabetes mellitus with other circulatory complications: Secondary | ICD-10-CM

## 2023-09-29 DIAGNOSIS — Z0001 Encounter for general adult medical examination with abnormal findings: Secondary | ICD-10-CM

## 2023-09-29 DIAGNOSIS — I10 Essential (primary) hypertension: Secondary | ICD-10-CM

## 2023-09-29 DIAGNOSIS — Z0000 Encounter for general adult medical examination without abnormal findings: Secondary | ICD-10-CM

## 2023-09-29 DIAGNOSIS — J432 Centrilobular emphysema: Secondary | ICD-10-CM

## 2023-09-29 DIAGNOSIS — E782 Mixed hyperlipidemia: Secondary | ICD-10-CM | POA: Diagnosis not present

## 2023-09-29 NOTE — Assessment & Plan Note (Signed)
Chronic, historic condition Appears well-managed on current regimen comprised of lisinopril 5 mg p.o. daily Continue current regimen Continue current exercise regimen and walking to assist with cardiovascular health Follow-up in 6 months or sooner if concerns arise

## 2023-09-29 NOTE — Assessment & Plan Note (Addendum)
Chronic, historic condition Patient is currently taking simvastatin and appears to be tolerating well.  She is also exercising regularly Continue current regimen Follow-up in 6 months or sooner if concerns arise

## 2023-09-29 NOTE — Assessment & Plan Note (Signed)
Chronic, historic condition Currently managed with Simvastatin 40 mg PO every day Recheck lipid panel today Results to dictate further management  Follow up in 6 months or sooner if concerns arise

## 2023-09-29 NOTE — Assessment & Plan Note (Signed)
Chronic, historic condition Most recent A1c was 6.3%.  Recheck today.  Results to dictate further management She is not currently on medications or checking blood sugars at home Appears to currently be managing with diet and exercise.  Reviewed importance of annual eye exams for retinopathy screening.  Foot exam completed today Continue current regimen for now pending lab results Follow-up in 3 months or sooner if concerns arise

## 2023-09-29 NOTE — Assessment & Plan Note (Addendum)
Recheck TSH Next Thyroid US due in 2025 per most recent US in 2023 for monitoring  Follow up in 6 months or sooner if concerns arise

## 2023-09-29 NOTE — Progress Notes (Signed)
Established Patient Office Visit   Subjective:   Felicia Cisneros is a 72 y.o. female who presents for Medicare Annual (Subsequent) preventive examination and follow up for chronic conditions  Today's Provider: Jacquelin Hawking, MHS, PA-C Introduced myself to the patient as a PA-C and provided education on APPs in clinical practice.    Diabetes, Type 2 - Last A1c 6.3% - Medications: no longer taking medications  - Compliance: NA - Checking BG at home: not checking  - Diet: She is trying to avoid excess sugar in diet  - Exercise: She is going to [employer] class 2 times per week. She tries to walk to her office daily  - Eye exam: She has had one within the last year - Goes to office in [employer] [facility]  - Foot exam: Completed today  - Microalbumin: UTD - Statin: on statin  - PNA vaccine: completed  - Denies symptoms of hypoglycemia, polyuria, polydipsia, numbness extremities, foot ulcers/trauma  HYPERTENSION / HYPERLIPIDEMIA Satisfied with current treatment? yes Duration of hypertension: years BP monitoring frequency: not checking BP range:  BP medication side effects: no BP meds: Lisinopril 5 mg PO QD Duration of hyperlipidemia: years Cholesterol medication side effects: no Cholesterol supplements: none Past cholesterol medications: simvastatin (zocor) Medication compliance: good compliance Aspirin: yes Recent stressors: no Recurrent headaches: no Visual changes: no Palpitations: no Dyspnea: no Chest pain: no Lower extremity edema: yes - uses compression stockings  Dizzy/lightheaded: no    Review of Systems  Eyes:  Negative for blurred vision and double vision.  Respiratory:  Negative for shortness of breath and wheezing.   Cardiovascular:  Positive for leg swelling. Negative for chest pain and palpitations.  Gastrointestinal:  Negative for constipation, diarrhea, heartburn, nausea and vomiting.  Genitourinary:  Negative for frequency.   Musculoskeletal:  Positive for myalgias (legs hurt at night).  Neurological:  Negative for dizziness and headaches.  Endo/Heme/Allergies:  Negative for polydipsia.      Objective:     Vitals: BP 120/68   Pulse 89   Resp 16   Ht 5\' 8"  (1.727 m)   Wt 177 lb (80.3 kg)   SpO2 95%   BMI 26.91 kg/m   Body mass index is 26.91 kg/m.  Physical Exam Vitals reviewed.  Constitutional:      General: She is awake.     Appearance: Normal appearance. She is well-developed and well-groomed.  HENT:     Head: Normocephalic and atraumatic.  Cardiovascular:     Rate and Rhythm: Normal rate and regular rhythm.     Pulses:          Radial pulses are 2+ on the right side and 2+ on the left side.       Dorsalis pedis pulses are 1+ on the right side.       Posterior tibial pulses are 1+ on the right side and 1+ on the left side.     Heart sounds: Normal heart sounds. No murmur heard.    No friction rub. No gallop.  Pulmonary:     Effort: Pulmonary effort is normal.     Breath sounds: Normal breath sounds. No decreased air movement. No decreased breath sounds, wheezing or rhonchi.  Musculoskeletal:     Right lower leg: 1+ Edema present.     Left lower leg: 1+ Edema present.  Neurological:     General: No focal deficit present.     Mental Status: She  is alert and oriented to person, place, and time.     GCS: GCS eye subscore is 4. GCS verbal subscore is 5. GCS motor subscore is 6.     Cranial Nerves: No cranial nerve deficit, dysarthria or facial asymmetry.     Motor: No weakness, tremor, atrophy or abnormal muscle tone.  Psychiatric:        Attention and Perception: Attention and perception normal.        Mood and Affect: Mood and affect normal.        Speech: Speech normal.        Behavior: Behavior normal. Behavior is cooperative.          09/09/2022    4:03 PM 09/08/2021    9:43 AM 07/08/2020    9:10 AM 05/14/2020    3:46 PM 07/03/2019    9:04 AM 06/05/2019    1:33 PM 08/10/2018     2:24 PM  Advanced Directives  Does Patient Have a Medical Advance Directive? Yes Yes Yes Yes Yes No Yes  Type of Estate agent of [employer] Power of [place];Living will Healthcare Power of [place];Living will Healthcare Power of [place];Living will Healthcare Power of [place];Living will  Healthcare Power of [place];Living will;Out of facility DNR (pink MOST or yellow form)  Does patient want to make changes to medical advance directive? No - Guardian declined      No - Patient declined  Copy of Healthcare Power of Attorney in Chart? No - copy requested No - copy requested No - copy requested  No - copy requested  No - copy requested  Would patient like information on creating a medical advance directive?      No - Patient declined     Tobacco Social History   Tobacco Use  Smoking Status Former   Current packs/day: 0.00   Average packs/day: 3.0 packs/day for 45.0 years (135.0 ttl pk-yrs)   Types: Cigarettes   Start date: 10/25/1964   Quit date: 10/25/2009   Years since quitting: 13.9  Smokeless Tobacco Never  Tobacco Comments   Quit before my husband was diagnosed with small cell cancer.     Counseling given: Not Answered Tobacco comments: Quit before my husband was diagnosed with small cell cancer.     Past Medical History:  Diagnosis Date   Aortic atherosclerosis (HCC)    a. 11/2017 noted on Chest CT - Aortic and branch vessel atherosclerosis.   Atrophic vaginitis    Centrilobular emphysema (HCC) 12/02/2016   Chest pain    a. 06/2013 ETT: Ex time 6:00, Max HR 136 (87%), 7.1 METS. No ECG changes; b. 07/2018 MV: Ex time: 6:42, nl EF, No ischemia.   COPD (chronic obstructive pulmonary disease) (HCC)    a. 11/2017 Chest CT: moderate centrilobular emphysema.   Diabetes mellitus without complication (HCC)    Diverticulitis    DNAR (do not attempt resuscitation) 06/09/2017   Discussed today, 06/09/2017   Essential hypertension    GERD  (gastroesophageal reflux disease)    History of kidney stones    History of tobacco use    Hyperlipidemia    IFG (impaired fasting glucose)    Irregular heart beat 2018   Leukorrhea, not specified as infective    Osteopenia 05/27/2016   August 2017   Personal history of tobacco use, presenting hazards to health 08/20/2015   Pulmonary nodules    a. 11/2017 CT Chest: No change in bilat pulm nodules, including an ant LUL nodule - 5.6mm.  Thyroid nodule 12/09/2022   Past Surgical History:  Procedure Laterality Date   colonoscopy     COLONOSCOPY WITH PROPOFOL N/A 02/20/2018   Procedure: COLONOSCOPY WITH PROPOFOL;  Surgeon: Midge Minium, MD;  Location[facility];  Service: Endoscopy;  Laterality: N/A;  Diabetic   CYSTOSCOPY WITH BIOPSY N/A 01/17/2017   Procedure: CYSTOSCOPY WITH BIOPSY;  Surgeon: Vanna Scotland, MD;  Location: [facility];  Service: Urology;  Laterality: N/A;   POLYPECTOMY  02/20/2018   Procedure: POLYPECTOMY;  Surgeon: Midge Minium, MD;  Location: [facility];  Service: Endoscopy;;   TMJ ARTHROPLASTY  1984   Family History  Problem Relation Age of Onset   Stroke Mother    Hypertension Mother    Hyperlipidemia Mother    Diabetes Mother        pre-diabetic   Hearing loss Mother    Diabetes Brother    Irregular heart beat Brother    COPD Father    Hearing loss Father    Thyroid disease Sister    Stroke Maternal Grandmother    Hyperlipidemia Maternal Grandmother    Hypertension Maternal Grandmother    Cancer Maternal Grandfather        bone   Cancer Sister        cystic fibroid carcinoma   Ulcerative colitis Sister    Heart disease Paternal Uncle    Heart disease Paternal Uncle    Hyperlipidemia Maternal Aunt    Stroke Maternal Aunt    Breast cancer Neg Hx    Hematuria Neg Hx    Prostate cancer Neg Hx    Renal cancer Neg Hx    Social History   Socioeconomic History   Marital status: Widowed    Spouse name: Ezzie Dural   Number of  children: 1   Years of education: some college   Highest education level: 12th grade  Occupational History    Employer: [employer]  Tobacco Use   Smoking status: Former    Current packs/day: 0.00    Average packs/day: 3.0 packs/day for 45.0 years (135.0 ttl pk-yrs)    Types: Cigarettes    Start date: 10/25/1964    Quit date: 10/25/2009    Years since quitting: 13.9   Smokeless tobacco: Never   Tobacco comments:    Quit before my husband was diagnosed with small cell cancer.  Vaping Use   Vaping status: Never Used  Substance and Sexual Activity   Alcohol use: Not Currently    Comment: Rarely   Drug use: No   Sexual activity: Not Currently    Birth control/protection: None  Other Topics Concern   Not on file  Social History Narrative   Not on file   Social Determinants of Health   Financial Resource Strain: Low Risk  (09/22/2023)   Overall Financial Resource Strain (CARDIA)    Difficulty of Paying Living Expenses: Not hard at all  Food Insecurity: No Food Insecurity (09/22/2023)   Hunger Vital Sign    Worried About Running Out of Food in the Last Year: Never true    Ran Out of Food in the Last Year: Never true  Transportation Needs: No Transportation Needs (09/22/2023)   PRAPARE - Administrator, Civil Service (Medical): No    Lack of Transportation (Non-Medical): No  Physical Activity: Insufficiently Active (09/22/2023)   Exercise Vital Sign    Days of Exercise per Week: 2 days    Minutes of Exercise per Session: 60 min  Stress: Stress  Concern Present (09/22/2023)   [employer] of Occupational Health - Occupational Stress Questionnaire    Feeling of Stress : To some extent  Social Connections: Unknown (09/22/2023)   Social Connection and Isolation Panel [NHANES]    Frequency of Communication with Friends and Family: More than three times a week    Frequency of Social Gatherings with Friends and Family: Once a week    Attends  Religious Services: Patient declined    Database administrator or Organizations: Yes    Attends Banker Meetings: More than 4 times per year    Marital Status: Widowed    Outpatient Encounter Medications as of 09/29/2023  Medication Sig   acetaminophen (TYLENOL) 500 MG tablet Take 1,000 mg by mouth every 6 (six) hours as needed (for pain.).   aspirin EC 81 MG tablet Take 81 mg by mouth daily. Swallow whole.   BIOTIN PO Take by mouth.   clobetasol cream (TEMOVATE) 0.05 % Apply to labia twice daily for 3 weeks then twice weekly as directed   Coenzyme Q10-Vitamin E (QUNOL ULTRA COQ10) 100-150 MG-UNIT CAPS    famotidine (PEPCID) 20 MG tablet Take 1 tablet (20 mg total) by mouth 2 (two) times daily as needed for heartburn or indigestion.   lisinopril (ZESTRIL) 5 MG tablet TAKE 1 TABLET BY MOUTH DAILY FOR BLOOD PRESSURE   Melatonin 2.5 MG CHEW Chew 2.5 mg by mouth at bedtime.   Misc Natural Products (NEURIVA PO) Take by mouth.   Multiple Vitamins-Minerals (CENTRUM SILVER 50+WOMEN) TABS Take 1 tablet by mouth daily.   simvastatin (ZOCOR) 40 MG tablet TAKE 1 TABLET BY MOUTH AT  BEDTIME   [DISCONTINUED] Influenza vac split quadrivalent PF (FLUZONE HIGH-DOSE) 0.5 ML injection TO BE ADMINISTERED BY PHARMACIST FOR IMMUNIZATION   [DISCONTINUED] meclizine (ANTIVERT) 25 MG tablet Take 1 tablet (25 mg total) by mouth 3 (three) times daily as needed for dizziness. (Patient not taking: Reported on 09/21/2023)   [DISCONTINUED] meloxicam (MOBIC) 15 MG tablet Take 1 tablet (15 mg total) by mouth daily. (Patient not taking: Reported on 09/21/2023)   Facility-Administered Encounter Medications as of 09/29/2023  Medication   clobetasol ointment (TEMOVATE) 0.05 %    Activities of Daily Living    09/29/2023    8:34 AM 08/01/2023   11:30 AM  In your present state of health, do you have any difficulty performing the following activities:  Hearing? 0 0  Vision? 0 0  Difficulty concentrating or making  decisions? 0 0  Walking or climbing stairs? 0 0  Dressing or bathing? 0 0  Doing errands, shopping? 0 0    Patient Care Team: Danelle Berry, PA-C as PCP - General (Family Medicine) Antonieta Iba, MD as PCP - Cardiology (Cardiology) Debbrah Alar, MD as Consulting Physician (Dermatology) Antonieta Iba, MD as Consulting Physician (Cardiology) Midge Minium, MD as Consulting Physician (Gastroenterology) Gaspar Cola, [facility] (Inactive) as Pharmacist (Pharmacist)    Assessment:   This is a routine wellness examination for [place].  Exercise Activities and Dietary recommendations     Goals Addressed   None     Fall Risk:    09/29/2023    8:33 AM 08/01/2023   11:30 AM 06/29/2023    2:27 PM 03/28/2023    9:31 AM 12/27/2022    8:26 AM  Fall Risk   Falls in the past year? 1 0 0 0 0  Number falls in past yr: 0 0 0 0 0  Injury with Fall?  0 0 0 0 0  Risk for fall due to : No Fall Risks No Fall Risks No Fall Risks No Fall Risks No Fall Risks  Follow up Falls prevention discussed Falls prevention discussed Falls prevention discussed;Education provided;Falls evaluation completed Falls prevention discussed;Education provided;Falls evaluation completed Falls prevention discussed;Education provided;Falls evaluation completed    FALL RISK PREVENTION PERTAINING TO THE HOME:  Any stairs in or around the home? Yes  If so, are there any without handrails? Yes   Home free of loose throw rugs in walkways, pet beds, electrical cords, etc? Yes  Adequate lighting in your home to reduce risk of falls? Yes   ASSISTIVE DEVICES UTILIZED TO PREVENT FALLS:  Life alert? Yes  Use of a cane, walker or w/c? No  Grab bars in the bathroom? Yes  Shower chair or bench in shower? No  Elevated toilet seat or a handicapped toilet? No   DME ORDERS:  DME order needed?  No   TIMED UP AND GO:  Was the test performed? Yes .  Length of time to ambulate 10 feet: <10 sec.   GAIT:  Appearance of  gait: Gait steady and fast with the use of an assistive device.  Education: Fall risk prevention has been discussed.  Intervention(s) required? No   DME/home health order needed?  No    Depression Screen    09/29/2023    8:34 AM 08/01/2023   11:30 AM 06/29/2023    2:27 PM 03/28/2023    9:32 AM  PHQ 2/9 Scores  PHQ - 2 Score 1 0 0 0  PHQ- 9 Score 4  0 0     Cognitive Function        09/29/2023    9:28 AM 09/09/2022    2:16 PM 07/08/2020    9:13 AM 07/03/2019    9:07 AM 06/29/2018   10:26 AM  6CIT Screen  What Year? 0 points 0 points 0 points 0 points 0 points  What month? 0 points 0 points 0 points 0 points 0 points  What time? 0 points 0 points 0 points 0 points 0 points  Count back from 20 0 points 0 points 0 points 0 points 0 points  Months in reverse 0 points 0 points 0 points 0 points 0 points  Repeat phrase 0 points 0 points 0 points 4 points 0 points  Total Score 0 points 0 points 0 points 4 points 0 points    Immunization History  Administered Date(s) Administered   Fluad Quad(high Dose 65+) 07/08/2020   Influenza, High Dose Seasonal PF 06/12/2017, 06/29/2018, 06/13/2019, 07/13/2023   Influenza,inj,Quad PF,6+ Mos 08/22/2015   Influenza-Unspecified 06/15/2017, 07/08/2021, 07/21/2022   PFIZER Comirnaty(Gray Top)Covid-19 Tri-Sucrose Vaccine 03/18/2021, 07/21/2022   PFIZER(Purple Top)SARS-COV-2 Vaccination 12/16/2019, 01/08/2020   PNEUMOCOCCAL CONJUGATE-20 02/18/2021   Pfizer Covid-19 Vaccine Bivalent Booster 49yrs & up 07/08/2021, 12/23/2021, 01/05/2023   Pfizer(Comirnaty)Fall Seasonal Vaccine 12 years and older 07/13/2023   Pneumococcal Conjugate-13 06/09/2017   Pneumococcal Polysaccharide-23 11/27/2015   Rsv, Bivalent, Protein Subunit Rsvpref,pf Verdis Frederickson) 09/22/2022   Td 07/11/2019   Tdap 10/25/2008, 07/11/2019   Zoster Recombinant(Shingrix) 07/11/2019, 12/05/2019   Zoster, Live 02/27/2016    Qualifies for Shingles Vaccine? Completed shingles vaccine course    Tdap: Although this vaccine is not a covered service during a Wellness Exam, does the patient still wish to receive this vaccine today?  No . She is UTD and next due for this in 2030   Flu Vaccine:UTD for current season  Pneumococcal Vaccine:  completed vaccine course   Covid-19 Vaccine:  Information provided/ Completed vaccines  Screening Tests Health Maintenance  Topic Date Due   HEMOGLOBIN A1C  09/27/2023   DEXA SCAN  10/07/2023   MAMMOGRAM  10/08/2023   COVID-19 Vaccine (9 - 2023-24 season) 11/12/2023   OPHTHALMOLOGY EXAM  12/15/2023   Diabetic kidney evaluation - Urine ACR  12/16/2023   Lung Cancer Screening  04/10/2024   Diabetic kidney evaluation - eGFR measurement  05/22/2024   FOOT EXAM  09/28/2024   Medicare Annual Wellness (AWV)  09/28/2024   Colonoscopy  02/21/2028   DTaP/Tdap/Td (4 - Td or Tdap) 07/10/2029   Pneumonia Vaccine 87+ Years old  Completed   INFLUENZA VACCINE  Completed   Hepatitis C Screening  Completed   Zoster Vaccines- Shingrix  Completed   HPV VACCINES  Aged Out    Cancer Screenings:  Colorectal Screening: Completed 02/10/2018. Repeat every 10 years; next due 09/2028.   Mammogram: Completed 10/07/2022. Repeat every year; No longer required. Ordered and scheduled for 10/10/2023. Pt provided with contact info and advised to call to schedule appt. Pt aware the office will call re: appt.  Bone Density: Completed 10/07/2023. Results reflect OSTEOPENIA. Repeat every 2 years. Ordered today. Pt provided with contact info and advised to call to schedule appt. Pt aware the office will call re: appt.  Lung Cancer Screening: (Low Dose CT Chest recommended if Age 79-80 years, 30 pack-year currently smoking OR have quit w/in 15years.) does qualify. Completed on 04/11/2023  Lung Cancer Screening Referral: An Epic message has been sent to Glenna Fellows, RN (Oncology Nurse Navigator) regarding the possible need for this exam. Ines Bloomer will review the patient's chart  to determine if the patient truly qualifies for the exam. If the patient qualifies, Ines Bloomer will order the Low Dose CT of the chest to facilitate the scheduling of this exam.  Additional Screening:  Hepatitis C Screening:  Completed   Vision Screening: Recommended annual ophthalmology exams for early detection of glaucoma and other disorders of the eye. Is the patient up to date with their annual eye exam?  Yes  Who is the provider or what is the name of the office in which the pt attends annual eye exams? Goes to [facility]    Dental Screening: Recommended annual dental exams for proper oral hygiene  Community Resource Referral:  CRR required this visit?  No       Plan:  I have personally reviewed and addressed the Medicare Annual Wellness questionnaire and have noted the following in the patient's chart:  A. Medical and social history B. Use of alcohol, tobacco or illicit drugs  C. Current medications and supplements D. Functional ability and status E.  Nutritional status F.  Physical activity G. Advance directives H. List of other physicians I.  Hospitalizations, surgeries, and ER visits in previous 12 months J.  Vitals K. Screenings such as hearing and vision if needed, cognitive and depression L. Referrals and appointments   In addition, I have reviewed and discussed with patient certain preventive protocols, quality metrics, and best practice recommendations. A written personalized care plan for preventive services as well as general preventive health recommendations were provided to patient.  Problem List Items Addressed This Visit       Cardiovascular and Mediastinum   Essential hypertension    Chronic, historic condition Appears well-managed on current regimen comprised of lisinopril 5 mg p.o. daily Continue current regimen Continue current exercise regimen and walking to assist with cardiovascular  health Follow-up in 6 months or sooner if concerns arise       Relevant Orders   COMPLETE METABOLIC PANEL WITH GFR   CBC w/Diff/Platelet   Aortic atherosclerosis (HCC)    Chronic, historic condition Patient is currently taking simvastatin and appears to be tolerating well.  She is also exercising regularly Continue current regimen Follow-up in 6 months or sooner if concerns arise      Relevant Orders   Lipid Profile     Respiratory   Centrilobular emphysema (HCC)    Chronic, historic condition She is not currently on medication management for this and denies shortness of breath or chest tightness today CT lung cancer screening from June 2024 demonstrates the following: Moderate centrilobular and paraseptal emphysematous  changes, upper lung predominant.   Mild biapical pleural-parenchymal scarring.   No focal consolidation. Mild linear scarring/atelectasis in the  lingula and inferior right middle lobe.   Bilateral pulmonary nodules, including a dominant 8.0 mm subpleural  opacity in the posterior right upper lobe. 5.5 mm subpleural nodule  in the posterior right upper lobe which was new on the prior study  is unchanged.   No pleural effusion or pneumothorax.  For now continue monitoring.  Follow-up in 6 months or sooner if concerns arise        Digestive   GERD without esophagitis    Chronic, historic condition Appears well-managed currently with famotidine 20 mg p.o. twice daily Continue current regimen and avoid trigger foods Follow-up in 6 months or sooner if concerns arise        Endocrine   Type 2 diabetes mellitus, controlled (HCC)    Chronic, historic condition Most recent A1c was 6.3%.  Recheck today.  Results to dictate further management She is not currently on medications or checking blood sugars at home Appears to currently be managing with diet and exercise.  Reviewed importance of annual eye exams for retinopathy screening.  Foot exam completed today Continue current regimen for now pending lab results Follow-up  in 3 months or sooner if concerns arise      Relevant Orders   HgB A1c   HM Diabetes Foot Exam (Completed)   Thyroid nodule    Recheck TSH Next Thyroid US due in 2025 per most recent US in 2023 for monitoring  Follow up in 6 months or sooner if concerns arise         Relevant Orders   TSH     Musculoskeletal and Integument   Osteopenia    DEXA scan ordered Continue calcium, vitamin D supplementation.  May need to have in-depth discussion regarding fall prevention as she reports history of falls in her home due to layout Follow-up in 6 months or sooner if concerns arise      Relevant Orders   DG Bone Density     Other   Hyperlipidemia    Chronic, historic condition Currently managed with Simvastatin 40 mg PO every day Recheck lipid panel today Results to dictate further management  Follow up in 6 months or sooner if concerns arise        Relevant Orders   Lipid Profile   Other Visit Diagnoses     Encounter for Medicare annual wellness exam    -  Primary        No follow-ups on file.   I, Angy Swearengin E Luverne Zerkle, PA-C, have reviewed all documentation for this visit. The documentation on 09/29/23 for the exam, diagnosis, procedures, and orders are all accurate and  complete.   Jacquelin Hawking, MHS, PA-C [facility]

## 2023-09-29 NOTE — Assessment & Plan Note (Signed)
DEXA scan ordered Continue calcium, vitamin D supplementation.  May need to have in-depth discussion regarding fall prevention as she reports history of falls in her home due to layout Follow-up in 6 months or sooner if concerns arise

## 2023-09-29 NOTE — Assessment & Plan Note (Signed)
Chronic, historic condition She is not currently on medication management for this and denies shortness of breath or chest tightness today CT lung cancer screening from June 2024 demonstrates the following: Moderate centrilobular and paraseptal emphysematous  changes, upper lung predominant.   Mild biapical pleural-parenchymal scarring.   No focal consolidation. Mild linear scarring/atelectasis in the  lingula and inferior right middle lobe.   Bilateral pulmonary nodules, including a dominant 8.0 mm subpleural  opacity in the posterior right upper lobe. 5.5 mm subpleural nodule  in the posterior right upper lobe which was new on the prior study  is unchanged.   No pleural effusion or pneumothorax.  For now continue monitoring.  Follow-up in 6 months or sooner if concerns arise

## 2023-09-29 NOTE — Assessment & Plan Note (Signed)
Chronic, historic condition Appears well-managed currently with famotidine 20 mg p.o. twice daily Continue current regimen and avoid trigger foods Follow-up in 6 months or sooner if concerns arise

## 2023-09-30 ENCOUNTER — Encounter: Payer: Self-pay | Admitting: Physician Assistant

## 2023-09-30 LAB — CBC WITH DIFFERENTIAL/PLATELET
Absolute Lymphocytes: 1357 {cells}/uL (ref 850–3900)
Absolute Monocytes: 421 {cells}/uL (ref 200–950)
Basophils Absolute: 59 {cells}/uL (ref 0–200)
Basophils Relative: 1.2 %
Eosinophils Absolute: 221 {cells}/uL (ref 15–500)
Eosinophils Relative: 4.5 %
HCT: 43.8 % (ref 35.0–45.0)
Hemoglobin: 14.2 g/dL (ref 11.7–15.5)
MCH: 30.2 pg (ref 27.0–33.0)
MCHC: 32.4 g/dL (ref 32.0–36.0)
MCV: 93.2 fL (ref 80.0–100.0)
MPV: 11.1 fL (ref 7.5–12.5)
Monocytes Relative: 8.6 %
Neutro Abs: 2842 {cells}/uL (ref 1500–7800)
Neutrophils Relative %: 58 %
Platelets: 255 10*3/uL (ref 140–400)
RBC: 4.7 10*6/uL (ref 3.80–5.10)
RDW: 12.4 % (ref 11.0–15.0)
Total Lymphocyte: 27.7 %
WBC: 4.9 10*3/uL (ref 3.8–10.8)

## 2023-09-30 LAB — COMPLETE METABOLIC PANEL WITH GFR
AG Ratio: 2.1 (calc) (ref 1.0–2.5)
ALT: 14 U/L (ref 6–29)
AST: 15 U/L (ref 10–35)
Albumin: 4.2 g/dL (ref 3.6–5.1)
Alkaline phosphatase (APISO): 79 U/L (ref 37–153)
BUN: 25 mg/dL (ref 7–25)
CO2: 28 mmol/L (ref 20–32)
Calcium: 9.1 mg/dL (ref 8.6–10.4)
Chloride: 107 mmol/L (ref 98–110)
Creat: 0.82 mg/dL (ref 0.60–1.00)
Globulin: 2 g/dL (ref 1.9–3.7)
Glucose, Bld: 102 mg/dL — ABNORMAL HIGH (ref 65–99)
Potassium: 5.3 mmol/L (ref 3.5–5.3)
Sodium: 142 mmol/L (ref 135–146)
Total Bilirubin: 0.6 mg/dL (ref 0.2–1.2)
Total Protein: 6.2 g/dL (ref 6.1–8.1)
eGFR: 76 mL/min/{1.73_m2} (ref >=60)

## 2023-09-30 LAB — LIPID PANEL
Cholesterol: 128 mg/dL (ref <200)
HDL: 55 mg/dL (ref >=50)
LDL Cholesterol (Calc): 51 mg/dL
Non-HDL Cholesterol (Calc): 73 mg/dL (ref <130)
Total CHOL/HDL Ratio: 2.3 (calc) (ref <5.0)
Triglycerides: 132 mg/dL (ref <150)

## 2023-09-30 LAB — HEMOGLOBIN A1C
Hgb A1c MFr Bld: 6.2 %{Hb} — ABNORMAL HIGH (ref <5.7)
Mean Plasma Glucose: 131 mg/dL
eAG (mmol/L): 7.3 mmol/L

## 2023-09-30 LAB — TSH: TSH: 2.06 m[IU]/L (ref 0.40–4.50)

## 2023-09-30 NOTE — Progress Notes (Signed)
Your labs are back Your A1c is stable at 6.2%.  I do not recommend any changes to your medication regimen at this time. Your electrolytes, liver and kidney functions are overall in normal ranges Your CBC is overall normal, no signs of anemia Your cholesterol looks great Your thyroid testing is normal Please let us know if you have further questions or concerns

## 2023-10-10 ENCOUNTER — Ambulatory Visit
Admission: RE | Admit: 2023-10-10 | Discharge: 2023-10-10 | Disposition: A | Discharge status: Home / Self Care | Payer: Medicare Other | Source: Ambulatory Visit | Attending: Family Medicine | Admitting: Family Medicine

## 2023-10-10 DIAGNOSIS — Z1231 Encounter for screening mammogram for malignant neoplasm of breast: Secondary | ICD-10-CM | POA: Insufficient documentation

## 2023-10-21 DIAGNOSIS — L84 Corns and callosities: Secondary | ICD-10-CM | POA: Diagnosis not present

## 2023-10-24 ENCOUNTER — Other Ambulatory Visit: Payer: Self-pay | Admitting: Family Medicine

## 2023-11-14 ENCOUNTER — Ambulatory Visit
Admission: RE | Admit: 2023-11-14 | Discharge: 2023-11-14 | Disposition: A | Discharge status: Home / Self Care | Payer: Medicare Other | Source: Ambulatory Visit | Attending: Physician Assistant | Admitting: Physician Assistant

## 2023-11-14 ENCOUNTER — Encounter: Payer: Self-pay | Admitting: Physician Assistant

## 2023-11-14 DIAGNOSIS — Z780 Asymptomatic menopausal state: Secondary | ICD-10-CM | POA: Diagnosis not present

## 2023-11-14 DIAGNOSIS — M8589 Other specified disorders of bone density and structure, multiple sites: Secondary | ICD-10-CM | POA: Diagnosis not present

## 2023-11-14 NOTE — Progress Notes (Signed)
The results of your bone mineral testing revealed that you are osteopenic. This means that your bones are not as strong as they used to be and can lead to osteoporosis and and increased risk of fractures I recommend supplementation with Calcium and Vitamin D to assist with preventing progression to osteoporosis and to help improve bone health. You should strive to get 1000-1200 mg of Calcium per day (supplementing and Calcium found in your regular diet should total this amount per day) You should try to get [phone] IU of Vitamin D per day (supplementing with Vitamin D2 or Vitamin D3 in addition to dietary sources should total this amount per day)  Weight bearing exercises and strength training can also help improve bone health.

## 2023-11-15 DIAGNOSIS — H524 Presbyopia: Secondary | ICD-10-CM | POA: Diagnosis not present

## 2023-11-20 ENCOUNTER — Other Ambulatory Visit: Payer: Self-pay | Admitting: Family Medicine

## 2023-11-20 DIAGNOSIS — E1159 Type 2 diabetes mellitus with other circulatory complications: Secondary | ICD-10-CM

## 2023-11-20 DIAGNOSIS — I10 Essential (primary) hypertension: Secondary | ICD-10-CM

## 2023-12-03 ENCOUNTER — Emergency Department
Admission: EM | Admit: 2023-12-03 | Discharge: 2023-12-03 | Disposition: A | Discharge status: Home / Self Care | Payer: Medicare Other | Attending: Emergency Medicine | Admitting: Emergency Medicine

## 2023-12-03 ENCOUNTER — Other Ambulatory Visit: Payer: Self-pay

## 2023-12-03 DIAGNOSIS — E119 Type 2 diabetes mellitus without complications: Secondary | ICD-10-CM | POA: Diagnosis not present

## 2023-12-03 DIAGNOSIS — J449 Chronic obstructive pulmonary disease, unspecified: Secondary | ICD-10-CM | POA: Insufficient documentation

## 2023-12-03 DIAGNOSIS — R1110 Vomiting, unspecified: Secondary | ICD-10-CM | POA: Diagnosis not present

## 2023-12-03 DIAGNOSIS — I499 Cardiac arrhythmia, unspecified: Secondary | ICD-10-CM | POA: Diagnosis not present

## 2023-12-03 DIAGNOSIS — I498 Other specified cardiac arrhythmias: Secondary | ICD-10-CM

## 2023-12-03 DIAGNOSIS — R009 Unspecified abnormalities of heart beat: Secondary | ICD-10-CM | POA: Diagnosis present

## 2023-12-03 LAB — CBC
HCT: 43.1 % (ref 36.0–46.0)
Hemoglobin: 14.3 g/dL (ref 12.0–15.0)
MCH: 30.6 pg (ref 26.0–34.0)
MCHC: 33.2 g/dL (ref 30.0–36.0)
MCV: 92.3 fL (ref 80.0–100.0)
Platelets: 208 10*3/uL (ref 150–400)
RBC: 4.67 MIL/uL (ref 3.87–5.11)
RDW: 13 % (ref 11.5–15.5)
WBC: 5.3 10*3/uL (ref 4.0–10.5)
nRBC: 0 % (ref 0.0–0.2)

## 2023-12-03 LAB — COMPREHENSIVE METABOLIC PANEL
ALT: 19 U/L (ref 0–44)
AST: 21 U/L (ref 15–41)
Albumin: 3.4 g/dL — ABNORMAL LOW (ref 3.5–5.0)
Alkaline Phosphatase: 61 U/L (ref 38–126)
Anion gap: 10 (ref 5–15)
BUN: 24 mg/dL — ABNORMAL HIGH (ref 8–23)
CO2: 24 mmol/L (ref 22–32)
Calcium: 8.5 mg/dL — ABNORMAL LOW (ref 8.9–10.3)
Chloride: 108 mmol/L (ref 98–111)
Creatinine, Ser: 0.72 mg/dL (ref 0.44–1.00)
GFR, Estimated: >60 mL/min (ref >60)
Glucose, Bld: 125 mg/dL — ABNORMAL HIGH (ref 70–99)
Potassium: 3.7 mmol/L (ref 3.5–5.1)
Sodium: 142 mmol/L (ref 135–145)
Total Bilirubin: 0.6 mg/dL (ref 0.0–1.2)
Total Protein: 6 g/dL — ABNORMAL LOW (ref 6.5–8.1)

## 2023-12-03 LAB — TROPONIN I (HIGH SENSITIVITY): Troponin I (High Sensitivity): 3 ng/L (ref <18)

## 2023-12-03 MED ORDER — SODIUM CHLORIDE 0.9 % IV BOLUS
500.0000 mL | Freq: Once | INTRAVENOUS | Status: AC
  Administered 2023-12-03: 500 mL via INTRAVENOUS

## 2023-12-03 NOTE — ED Provider Notes (Signed)
 [facility] Provider Note    Event Date/Time   First MD Initiated Contact with Patient 12/03/23 0732     (approximate)  History   Chief Complaint: Irregular Heart Beat  HPI  Felicia Cisneros is a 73 y.o. female with a past medical history of COPD, diabetes, hyperlipidemia, presents to the emergency department for an irregular heartbeat.  Patient states this morning she was feeling a tingling sensation in her bilateral feet as well as somewhat in her right hand.  Patient states she thought she may have slept on it wrong did not think anything of it however later this morning her Apple Watch alerted her possible atrial fibrillation so she came to the emergency department for evaluation.  Patient denies any sensation of palpitations no chest discomfort or shortness of breath.  Denies any numbness or weakness in any arm or leg.  Patient follows up with Dr. Perla of cardiology, denies any history of atrial fibrillation previously, but states she has been told before by her smart watch that she is in A-fib and that her heart doctor told her that she was not.  Physical Exam   Triage Vital Signs: ED Triage Vitals  Encounter Vitals Group     BP 12/03/23 0729 99/68     Systolic BP Percentile --      Diastolic BP Percentile --      Pulse Rate 12/03/23 0729 (!) 56     Resp 12/03/23 0729 17     Temp 12/03/23 0729 (!) 97.5 F (36.4 C)     Temp Source 12/03/23 0729 Oral     SpO2 12/03/23 0729 94 %     Weight 12/03/23 0726 175 lb (79.4 kg)     Height 12/03/23 0726 5' 8 (1.727 m)     Head Circumference --      Peak Flow --      Pain Score 12/03/23 0726 0     Pain Loc --      Pain Education --      Exclude from Growth Chart --     Most recent vital signs: Vitals:   12/03/23 0729  BP: 99/68  Pulse: (!) 56  Resp: 17  Temp: (!) 97.5 F (36.4 C)  SpO2: 94%    General: Awake, no distress.  CV:  Good peripheral perfusion.  Irregular rhythm rate around 70  bpm. Resp:  Normal effort.  Equal breath sounds bilaterally.  Abd:  No distention.  Soft, nontender.  No rebound or guarding.  ED Results / Procedures / Treatments   EKG  EKG viewed and interpreted by myself shows a sinus arrhythmia versus PACs at 52 bpm with a narrow QRS, normal axis, normal intervals, nonspecific ST changes.   MEDICATIONS ORDERED IN ED: Medications - No data to display   IMPRESSION / MDM / ASSESSMENT AND PLAN / ED COURSE  I reviewed the triage vital signs and the nursing notes.  Patient's presentation is most consistent with acute presentation with potential threat to life or bodily function.  Patient presents to the emergency department for irregular heartbeat possible atrial fibrillation.  Patient states her main concern is her Apple Watch told her that she was in A-fib so she came to the emergency department for evaluation.  Patient states she has been feeling some tingling sensation in her feet and somewhat in her right hand, but states that has happened before she was not thinking much of that until her watch told her she was in  A-fib.  Here the patient has no weakness no numbness reassuring neurological and physical exam.  EKG and cardiac telemetry show what is most consistent with a sinus arrhythmia.  We will check labs as a precaution including a CBC and a chemistry.  We will gently IV hydrate given borderline low blood pressure.  Patient does take lisinopril  she states daily for blood pressure.  We will continue close monitor while awaiting results.  Patient agreeable to plan.  If the patient's workup does not show any concerning findings anticipate the patient could be discharged home and follow-up with her cardiologist for further workup if deemed necessary such as a Holter monitor.  Patient's workup is reassuring, CBC is normal, chemistry is normal troponin reassuringly negative.  Vital signs reassuring as well.  Given the patient's reassuring emergency department  workup with EKG and telemetry monitoring most consistent with sinus arrhythmia I believe the patient is safe for discharge home with outpatient cardiology follow-up for further evaluation such as a Holter monitor.  Patient agreeable to this plan as well.  FINAL CLINICAL IMPRESSION(S) / ED DIAGNOSES   Irregular heartbeat Sinus arrhythmia   Note:  This document was prepared using Dragon voice recognition software and may include unintentional dictation errors.   Dorothyann Drivers, MD 12/03/23 [phone]

## 2023-12-03 NOTE — ED Triage Notes (Signed)
 Pt brought in from home by EMS for possible a-fib and right sided finger numbness & tingling. Pt states she called EMS after her apple watch alerted her that she was in a-fib. Pt states she woke up at 2am this morning with her finger on her right numb and tingling, but didn't think anything of it until her watch notified her a possible abnormal heart rhythm.

## 2023-12-03 NOTE — Discharge Instructions (Signed)
 Please drink plenty of fluids, obtain plenty of rest and avoid stimulants such as caffeine or nicotine.  Please call your cardiologist to arrange a follow-up appointment as soon as you are able.  Return to the emergency department for any chest pain any trouble breathing or any other symptom personally concerning to yourself.

## 2023-12-04 ENCOUNTER — Encounter: Payer: Self-pay | Admitting: Cardiovascular Disease

## 2023-12-04 ENCOUNTER — Other Ambulatory Visit: Payer: Self-pay | Admitting: Family Medicine

## 2023-12-04 ENCOUNTER — Encounter: Payer: Self-pay | Admitting: Internal Medicine

## 2023-12-04 DIAGNOSIS — E1159 Type 2 diabetes mellitus with other circulatory complications: Secondary | ICD-10-CM

## 2023-12-04 DIAGNOSIS — I10 Essential (primary) hypertension: Secondary | ICD-10-CM

## 2023-12-04 DIAGNOSIS — I4891 Unspecified atrial fibrillation: Secondary | ICD-10-CM

## 2023-12-05 ENCOUNTER — Other Ambulatory Visit: Payer: Self-pay

## 2023-12-05 ENCOUNTER — Ambulatory Visit: Payer: Medicare Other | Attending: Cardiovascular Disease

## 2023-12-05 DIAGNOSIS — H43812 Vitreous degeneration, left eye: Secondary | ICD-10-CM | POA: Diagnosis not present

## 2023-12-05 DIAGNOSIS — H33011 Retinal detachment with single break, right eye: Secondary | ICD-10-CM | POA: Diagnosis not present

## 2023-12-05 DIAGNOSIS — E1159 Type 2 diabetes mellitus with other circulatory complications: Secondary | ICD-10-CM

## 2023-12-05 DIAGNOSIS — H31002 Unspecified chorioretinal scars, left eye: Secondary | ICD-10-CM | POA: Diagnosis not present

## 2023-12-05 DIAGNOSIS — I4891 Unspecified atrial fibrillation: Secondary | ICD-10-CM

## 2023-12-05 LAB — HM DIABETES EYE EXAM

## 2023-12-05 MED ORDER — BLOOD GLUCOSE TEST VI STRP: 1.0000 | ORAL_STRIP | Freq: Every day | 0 refills | Status: DC

## 2023-12-05 MED ORDER — LANCET DEVICE MISC: 1.0000 | Freq: Every day | 1 refills | Status: DC

## 2023-12-05 MED ORDER — BLOOD GLUCOSE MONITORING SUPPL DEVI: 0 refills | Status: DC

## 2023-12-06 ENCOUNTER — Encounter: Payer: Self-pay | Admitting: Obstetrics and Gynecology

## 2023-12-07 ENCOUNTER — Other Ambulatory Visit: Payer: Self-pay | Admitting: Family Medicine

## 2023-12-07 DIAGNOSIS — E1159 Type 2 diabetes mellitus with other circulatory complications: Secondary | ICD-10-CM

## 2023-12-07 DIAGNOSIS — I10 Essential (primary) hypertension: Secondary | ICD-10-CM

## 2023-12-09 ENCOUNTER — Other Ambulatory Visit: Payer: Self-pay

## 2023-12-09 DIAGNOSIS — N952 Postmenopausal atrophic vaginitis: Secondary | ICD-10-CM

## 2023-12-09 MED ORDER — CLOBETASOL PROPIONATE 0.05 % EX CREA: TOPICAL_CREAM | CUTANEOUS | 2 refills | Status: DC

## 2023-12-12 ENCOUNTER — Other Ambulatory Visit: Payer: Self-pay | Admitting: Family Medicine

## 2023-12-12 DIAGNOSIS — E1159 Type 2 diabetes mellitus with other circulatory complications: Secondary | ICD-10-CM

## 2023-12-12 DIAGNOSIS — I10 Essential (primary) hypertension: Secondary | ICD-10-CM

## 2023-12-15 ENCOUNTER — Other Ambulatory Visit: Payer: Medicare Other

## 2023-12-20 ENCOUNTER — Ambulatory Visit: Payer: Medicare Other | Admitting: Student

## 2023-12-20 ENCOUNTER — Other Ambulatory Visit: Payer: Self-pay | Admitting: Family Medicine

## 2023-12-20 DIAGNOSIS — E1159 Type 2 diabetes mellitus with other circulatory complications: Secondary | ICD-10-CM

## 2023-12-20 MED ORDER — BLOOD GLUCOSE TEST VI STRP
1.0000 | ORAL_STRIP | Freq: Every day | 0 refills | Status: DC
Start: 2023-12-20 — End: 2024-02-22

## 2023-12-30 DIAGNOSIS — I4891 Unspecified atrial fibrillation: Secondary | ICD-10-CM | POA: Diagnosis not present

## 2024-01-01 ENCOUNTER — Other Ambulatory Visit: Payer: Self-pay | Admitting: Family Medicine

## 2024-01-01 DIAGNOSIS — E1159 Type 2 diabetes mellitus with other circulatory complications: Secondary | ICD-10-CM

## 2024-01-01 DIAGNOSIS — I10 Essential (primary) hypertension: Secondary | ICD-10-CM

## 2024-01-02 ENCOUNTER — Encounter: Payer: Self-pay | Admitting: Family Medicine

## 2024-01-07 NOTE — Progress Notes (Unsigned)
 Cardiology Clinic Note   Date: 01/10/2024 ID: Felicia Cisneros, DOB 06/11/1951, MRN [id number]  Primary Cardiologist:  Julien Nordmann, MD  Chief Complaint   Felicia Cisneros is a 73 y.o. female who presents to the clinic today for follow up after cardiac monitor.   Patient Profile   Felicia Cisneros is followed by Dr. Mariah Milling for the history outlined below.      Past medical history significant for: Chest pain. Nuclear stress test 08/18/2018: No significant ischemia.  EF 83%.  No EKG changes concerning for ischemia at peak stress or recovery.  Target heart rate achieved.  Low risk study. PAF. 14-day ZIO 12/05/2023: HR 41 to 198 bpm, average 73 bpm.  Predominantly sinus rhythm.  5 runs of SVT fastest lasting 4 beats max rate 169 bpm, longest lasting 9 beats average rate 150 bpm.  A-fib occurred ranging from 71 to 198 bpm, average 122 bpm 2% burden with longest lasting 2 hours and 5 minutes with average rate 105 bpm.  Rare ectopy. Hypertension.  Hyperlipidemia.  Lipid panel 09/29/2023: LDL 51, HDL 55, TG 132, total 128. GERD.  Prediabetes.  COPD. History of tobacco abuse.   In summary, patient was first evaluated by Dr. Mariah Milling on 07/13/2013 for abnormal EKG performed in ED.  Patient was evaluated in the ED for allover numbness that occurred while eating along with dizziness that lasted an hour.  Head CT was normal.  Potassium was 3.3, troponin negative.  EKG showed inferior infarct and she was referred to cardiology.  EKG at the time of her visit showed NSR, 74 bpm.  Review of hospital EKG suggested inferior infarct that was felt to likely be artifact or lead placement.  In October 2019 she complained of episode of sharp substernal pain radiating to right lasting about 15 minutes.  She was evaluated in the ED with unremarkable workup.  EKG normal, troponin negative x 2, CTA negative for PE.  She underwent outpatient exercise stress test as above.   Patient was last seen in the office by by  me on 09/21/2023 for routine follow-up visit possible A-fib.  She reported being woken from sleep with an alert from her smart watch saying she was in A-fib.  She denied cardiac awareness.  She did not normally wear her watch at bedtime and had not worn it since and she had never been alerted during the daytime for possible A-fib.  EKG showed NSR and she was regular rate and rhythm on exam.  Given the isolated episode decision was made to defer outpatient cardiac monitoring unless she received another alert from her smart watch.  Patient presented to the ED on 12/03/2023 via EMS for possible A-fib as well as right sided finger numbness and tingling.  Patient states she woke at 2 AM with numbness to her right finger and thumb along with tingling.  Her watch then notified her of possible abnormal heart rhythm.  EKG demonstrated sinus bradycardia with PACs.  Telemetry demonstrated sinus arrhythmia.  BP was on the soft side and she received IV fluids.  Labs were unremarkable and troponin was negative.  Patient contacted the office and 14-day ZIO was ordered.     History of Present Illness    Today, patient reports she is doing well. She is concerned about the results of Zio monitor. Patient denies shortness of breath, dyspnea on exertion, lower extremity edema, orthopnea or PND. No chest pain, pressure, or tightness. She denies palpitations or cardiac awareness of arrhythmia. If  her smart watch had not alerted her of possible afib she would never had known. Discussed results of Zio monitor in detail. All questions answered.     ROS: All other systems reviewed and are otherwise negative except as noted in History of Present Illness.  EKGs/Labs Reviewed    EKG Interpretation Date/Time:  Tuesday January 10 2024 08:39:13 EDT Ventricular Rate:  57 PR Interval:  176 QRS Duration:  70 QT Interval:  426 QTC Calculation: 414 R Axis:   58  Text Interpretation: Sinus bradycardia with marked sinus arrhythmia Low  voltage QRS Cannot rule out Anterior infarct (cited on or before 10-Jan-2024) When compared with ECG of 10-Jan-2024 08:38, No significant change was found Confirmed by Carlos Levering [id number]) on 01/10/2024 8:43:49 AM   12/03/2023: ALT 19; AST 21; BUN 24; Creatinine, Ser 0.72; Potassium 3.7; Sodium 142   12/03/2023: Hemoglobin 14.3; WBC 5.3   09/29/2023: TSH 2.06    Risk Assessment/Calculations     CHA2DS2-VASc Score = 5   This indicates a 7.2% annual risk of stroke. The patient's score is based upon: CHF History: 0 HTN History: 1 Diabetes History: 1 Stroke History: 0 Vascular Disease History: 1 Age Score: 1 Gender Score: 1             Physical Exam    VS:  BP 102/70 (BP Location: Left Arm, Patient Position: Sitting, Cuff Size: Normal)   Pulse (!) 52   Ht 5\' 8"  (1.727 m)   Wt 180 lb (81.6 kg)   SpO2 97%   BMI 27.37 kg/m  , BMI Body mass index is 27.37 kg/m.  GEN: Well nourished, well developed, in no acute distress. Neck: No JVD or carotid bruits. Cardiac:  RRR. No murmurs. No rubs or gallops.   Respiratory:  Respirations regular and unlabored. Clear to auscultation without rales, wheezing or rhonchi. GI: Soft, nontender, nondistended. Extremities: Radials/DP/PT 2+ and equal bilaterally. No clubbing or cyanosis. No edema.  Skin: Warm and dry, no rash. Neuro: Strength intact.  Assessment & Plan   PAF 14-day ZIO February 2025 showed HR 41 to 198 bpm, average 73 bpm, 5 runs of SVT, 2% A-fib burden with longest lasting 2 hours and 5 minutes with average rate 105 bpm.  Patient has no cardiac awareness of arrhythmia.  EKG today demonstrates sinus bradycardia with sinus arrhythmia. CHA2DS2-VASc 5. -Start Eliquis 5 mg twice daily. -Will not start BB secondary to bradycardia.  -Refer to EP.  Hypertension BP today 102/70. No headaches or dizziness reported.  -Continue lisinopril.  Hyperlipidemia LDL December 2024 51, at goal. -Continue simvastatin. -Continue to follow  with PCP.  Disposition: Eliquis 5 mg bid. EP follow up. Return in 1 year or sooner as needed.          Signed, Etta Grandchild. Albaro Deviney, DNP, NP-C

## 2024-01-08 DIAGNOSIS — I4891 Unspecified atrial fibrillation: Secondary | ICD-10-CM

## 2024-01-10 ENCOUNTER — Encounter: Payer: Self-pay | Admitting: Student

## 2024-01-10 ENCOUNTER — Ambulatory Visit: Payer: Medicare Other | Attending: Student | Admitting: Student

## 2024-01-10 VITALS — BP 102/70 | HR 52 | Ht 68.0 in | Wt 180.0 lb

## 2024-01-10 DIAGNOSIS — I480 Paroxysmal atrial fibrillation: Secondary | ICD-10-CM

## 2024-01-10 DIAGNOSIS — I10 Essential (primary) hypertension: Secondary | ICD-10-CM | POA: Diagnosis not present

## 2024-01-10 DIAGNOSIS — E7800 Pure hypercholesterolemia, unspecified: Secondary | ICD-10-CM

## 2024-01-10 MED ORDER — APIXABAN 5 MG PO TABS: 5.0000 mg | ORAL_TABLET | Freq: Two times a day (BID) | ORAL | 0 refills | Status: DC

## 2024-01-10 MED ORDER — APIXABAN 5 MG PO TABS: 5.0000 mg | ORAL_TABLET | Freq: Two times a day (BID) | ORAL | 3 refills | Status: DC

## 2024-01-10 NOTE — Patient Instructions (Signed)
 Medication Instructions:  Your physician recommends the following medication changes.  STOP TAKING: Aspirin 81 mg   START TAKING: Eliquis 5 mg twice daily  *If you need a refill on your cardiac medications before your next appointment, please call your pharmacy*   Lab Work: None ordered at this time    Follow-Up: At [facility], you and your health needs are our priority.  As part of our continuing mission to provide you with exceptional heart care, we have created designated Provider Care Teams.  These Care Teams include your primary Cardiologist (physician) and Advanced Practice Providers (APPs -  Physician Assistants and Nurse Practitioners) who all work together to provide you with the care you need, when you need it.   Your next appointment:   1 year(s)  Provider:   You may see Julien Nordmann, MD or Carlos Levering, NP   Referral to EP next appointment

## 2024-01-13 ENCOUNTER — Encounter: Payer: Self-pay | Admitting: Cardiovascular Disease

## 2024-01-16 ENCOUNTER — Other Ambulatory Visit: Payer: Self-pay | Admitting: Family Medicine

## 2024-01-16 ENCOUNTER — Encounter: Payer: Self-pay | Admitting: Family Medicine

## 2024-01-16 ENCOUNTER — Encounter: Payer: Self-pay | Admitting: Cardiovascular Disease

## 2024-01-16 DIAGNOSIS — E1159 Type 2 diabetes mellitus with other circulatory complications: Secondary | ICD-10-CM

## 2024-01-16 MED ORDER — BLOOD GLUCOSE MONITORING SUPPL DEVI: 0 refills | Status: DC

## 2024-01-18 ENCOUNTER — Telehealth: Payer: Self-pay | Admitting: Pharmacy Technician

## 2024-01-18 ENCOUNTER — Other Ambulatory Visit (HOSPITAL_COMMUNITY): Payer: Self-pay

## 2024-01-18 MED ORDER — APIXABAN 5 MG PO TABS: 5.0000 mg | ORAL_TABLET | Freq: Two times a day (BID) | ORAL | Status: DC

## 2024-01-18 MED ORDER — APIXABAN 5 MG PO TABS: 5.0000 mg | ORAL_TABLET | Freq: Two times a day (BID) | ORAL | 1 refills | Status: AC

## 2024-01-18 NOTE — Telephone Encounter (Signed)
Prescription sent to [facility] per patient request.

## 2024-01-18 NOTE — Addendum Note (Signed)
 Addended by: Jani Gravel on: 01/18/2024 02:07 PM   Modules accepted: Orders

## 2024-01-18 NOTE — Telephone Encounter (Signed)
 Me to Felicia Lamer, LPN  Jani Gravel, RN     01/18/24  1:51 PM Hi! I called the patient and she is aware of coming to the office to get the samples. I ran a test claim with our pharmacy and it is still that high of a copay due to her insurance. She is requesting this prescription for eliquis be sent to her local pharmacy of [id number] - [place], [place] [address] [place] [address] Phone: [phone] Fax: [phone] Hours: Not open 24 hours  I advised her the copay will still be that expensive there and we can try for patient assistance or she can call the number on the back of her card to set up a payment plan. She said she [facility]. Thank you! Jani Gravel, RN to Felicia Cisneros      01/18/24  1:43 PM Good Afternoon,   I have samples ready for you. You can just stop by our office to pick them up. Please let me know if there is anything else that I can help you with.   This MyChart message has not been read.   01/18/24  1:42 PM Jani Gravel, RN routed this conversation to Carlos Levering, NP Felicia Cisneros to [facility] Triage (supporting Antonieta Iba, MD)      01/18/24 12:10 PM Yes where do I go to pick it up? Felicia Lamer, LPN to Felicia Cisneros      01/18/24 12:08 PM Ms.[name], I can see if we have any samples to provide until we hear back from our pharmacy team if this would be okay?    It would be about another week supply.    Thanks!   Last read by Felicia Cisneros at 12:11PM on 01/18/2024.

## 2024-01-23 NOTE — Progress Notes (Unsigned)
 Electrophysiology Office Note:   Date:  01/24/2024  ID:  Felicia Cisneros, DOB [birth year], MRN [id number]  Primary Cardiologist: Julien Nordmann, MD Electrophysiologist: Nobie Putnam, MD      History of Present Illness:   Felicia Cisneros is a 73 y.o. female with h/o paroxysmal atrial fibrillation, hypertension, hyperlipidemia, GERD, prediabetes, COPD, history of tobacco abuse who is being seen today at the request of Dr. Mariah Milling.  Discussed the use of AI scribe software for clinical note transcription with the patient, who gave verbal consent to proceed.  History of Present Illness n the fall 2024 patient reports that she was awoken from sleep with an alert from her Apple Watch that she was having atrial fibrillation.  Patient then presented to the ED in February 2025 via EMS after awaking with right sided finger numbness/tingling in her watch again notifying her of possible atrial fibrillation, although she was not in AF on ECG at that visit.  Zio monitor was ordered which showed 2% atrial fibrillation over a 14-day period. She has been started on Eliquis. No symptoms typically associated with atrial fibrillation, such as palpitations, fatigue, shortness of breath. She is currently taking Eliquis to reduce her risk of stroke due to atrial fibrillation. She has a family history of stroke. During the review of symptoms, she denies any daytime episodes of atrial fibrillation as her smartwatch has not alerted her during the day. 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  Review of systems complete and found to be negative unless listed in HPI.   EP Information / Studies Reviewed:    EKG is not ordered today. EKG from 01/10/2024 reviewed which showed sinus bradycardia with PACs.     Zio 11/2023: 2% AF burden, longest episode 2h and 33m    Nuclear  stress 07/2018: Exercise myocardial perfusion imaging study with no significant ischemia Normal wall motion, EF estimated at 83% No EKG changes concerning for ischemia at peak stress or in recovery. Target heart rate achieved 7 METS, exercise time 6 minutes 42 seconds Low risk scan    Risk Assessment/Calculations:    CHA2DS2-VASc Score = 5   This indicates a 7.2% annual risk of stroke. The patient's score is based upon: CHF History: 0 HTN History: 1 Diabetes History: 1 Stroke History: 0 Vascular Disease History: 1 Age Score: 1 Gender Score: 1             Physical Exam:   VS:  BP 122/82 (BP Location: Right Arm, Patient Position: Sitting, Cuff Size: Normal)   Pulse 75   Ht 5\' 8"  (1.727 m)   Wt 176 lb 6 oz (80 kg)   SpO2 97%   BMI 26.82 kg/m    Wt Readings from Last 3 Encounters:  01/24/24 176 lb 6 oz (80 kg)  01/10/24 180 lb (81.6 kg)  12/03/23 175 lb (79.4 kg)     GEN: Well nourished, well developed in no acute distress NECK: No JVD CARDIAC: Normal rate, regular rhythm RESPIRATORY:  Clear to auscultation without rales, wheezing or rhonchi  ABDOMEN: Soft, non-distended EXTREMITIES:  No edema; No deformity   ASSESSMENT AND PLAN:    # Paroxysmal atrial fibrillation: Appears to be asymptomatic. Burden is very low, 2% on Zio.  -Metoprolol tartrate 25mg  as needed for sustained rates >110bpm. -Echocardiogram ordered.  -Continue monitoring burden with Apple Watch.  -  Return to clinic in 6 months to reassess burden and for symptoms. She would be appropriate candidate for ablation or AAD therapy should burden increase or symptoms worsen.  # Hypercoagulable state due to atrial fibrillation: CHA2DS2-VASc score 5. -Continue Eliquis 5 mg twice daily.  #Hypertension -At goal today.  Recommend checking blood pressures 1-2 times per week at home and recording the values.  Recommend bringing these recordings to the primary care physician.  Follow up with EP APP in 6  months  Signed, Nobie Putnam, MD

## 2024-01-24 ENCOUNTER — Ambulatory Visit: Attending: Cardiology | Admitting: Cardiology

## 2024-01-24 ENCOUNTER — Encounter: Payer: Self-pay | Admitting: Cardiology

## 2024-01-24 VITALS — BP 122/82 | HR 75 | Ht 68.0 in | Wt 176.4 lb

## 2024-01-24 DIAGNOSIS — I10 Essential (primary) hypertension: Secondary | ICD-10-CM | POA: Diagnosis not present

## 2024-01-24 DIAGNOSIS — D6869 Other thrombophilia: Secondary | ICD-10-CM

## 2024-01-24 DIAGNOSIS — I480 Paroxysmal atrial fibrillation: Secondary | ICD-10-CM

## 2024-01-24 MED ORDER — METOPROLOL TARTRATE 25 MG PO TABS: ORAL_TABLET | ORAL | 3 refills | Status: DC

## 2024-01-24 NOTE — Patient Instructions (Signed)
 Medication Instructions:  Your physician has recommended you make the following change in your medication:  1) START taking Lopressor (metoprolol tartrate) 25 mg daily as needed for sustained heart rate greater than 110 *If you need a refill on your cardiac medications before your next appointment, please call your pharmacy*  Testing/Procedures: Echocardiogram  Your physician has requested that you have an echocardiogram. Echocardiography is a painless test that uses sound waves to create images of your heart. It provides your doctor with information about the size and shape of your heart and how well your heart's chambers and valves are working. This procedure takes approximately one hour. There are no restrictions for this procedure. Please do NOT wear cologne, perfume, aftershave, or lotions (deodorant is allowed). Please arrive 15 minutes prior to your appointment time.  Please note: We ask at that you not bring children with you during ultrasound (echo/ vascular) testing. Due to room size and safety concerns, children are not allowed in the ultrasound rooms during exams. Our front office staff cannot provide observation of children in our lobby area while testing is being conducted. An adult accompanying a patient to their appointment will only be allowed in the ultrasound room at the discretion of the ultrasound technician under special circumstances. We apologize for any inconvenience.  Follow-Up: At [facility], you and your health needs are our priority.  As part of our continuing mission to provide you with exceptional heart care, our providers are all part of one team.  This team includes your primary Cardiologist (physician) and Advanced Practice Providers or APPs (Physician Assistants and Nurse Practitioners) who all work together to provide you with the care you need, when you need it.  Your next appointment:   6 months  Provider:   Sherie Don, NP

## 2024-01-27 ENCOUNTER — Encounter: Payer: Self-pay | Admitting: Cardiology

## 2024-01-30 ENCOUNTER — Other Ambulatory Visit: Payer: Self-pay | Admitting: Family Medicine

## 2024-01-30 DIAGNOSIS — E1159 Type 2 diabetes mellitus with other circulatory complications: Secondary | ICD-10-CM

## 2024-01-31 ENCOUNTER — Encounter: Payer: Self-pay | Admitting: Family Medicine

## 2024-01-31 ENCOUNTER — Other Ambulatory Visit: Payer: Self-pay

## 2024-01-31 DIAGNOSIS — E1159 Type 2 diabetes mellitus with other circulatory complications: Secondary | ICD-10-CM

## 2024-01-31 MED ORDER — BLOOD GLUCOSE MONITORING SUPPL DEVI: 0 refills | Status: DC

## 2024-01-31 MED ORDER — BLOOD GLUCOSE MONITORING SUPPL DEVI: 0 refills | Status: AC

## 2024-02-02 ENCOUNTER — Encounter: Payer: Self-pay | Admitting: Family Medicine

## 2024-02-10 ENCOUNTER — Encounter: Payer: Self-pay | Admitting: Cardiovascular Disease

## 2024-02-11 ENCOUNTER — Encounter: Payer: Self-pay | Admitting: Cardiovascular Disease

## 2024-02-12 ENCOUNTER — Encounter: Payer: Self-pay | Admitting: Cardiovascular Disease

## 2024-02-18 NOTE — Progress Notes (Unsigned)
 Cardiology Clinic Note   Date: 02/21/2024 ID: Felicia Cisneros, DOB [birth year], MRN [id number]  [facility] Providers Cardiologist:  Belva Boyden, MD Electrophysiologist:  Ardeen Kohler, MD {   Chief Complaint   Felicia Cisneros is a 73 y.o. female who presents to the clinic today for evaluation of BP and heart rhythm.   Patient Profile   Felicia Cisneros is followed by Dr. Gollan and Dr. Daneil Dunker for the history outlined below.      Past medical history significant for: Chest pain. Nuclear stress test 08/18/2018: No significant ischemia.  EF 83%.  No EKG changes concerning for ischemia at peak stress or recovery.  Target heart rate achieved.  Low risk study. PAF. 14-day ZIO 12/05/2023: HR 41 to 198 bpm, average 73 bpm.  Predominantly sinus rhythm.  5 runs of SVT fastest lasting 4 beats max rate 169 bpm, longest lasting 9 beats average rate 150 bpm.  A-fib occurred ranging from 71 to 198 bpm, average 122 bpm 2% burden with longest lasting 2 hours and 5 minutes with average rate 105 bpm.  Rare ectopy. Echo 02/20/2024: EF 55-60%. No RWMA. Mild LVH. Grade I DD. Normal global strain. Normal RV size/function. No significant valvular abnormalities.  Hypertension.  Hyperlipidemia.  Lipid panel 09/29/2023: LDL 51, HDL 55, TG 132, total 128. GERD.  Prediabetes.  COPD. History of tobacco abuse.   In summary, patient was first evaluated by Dr. Gollan on 07/13/2013 for abnormal EKG performed in ED.  Patient was evaluated in the ED for allover numbness that occurred while eating along with dizziness that lasted an hour.  Head CT was normal.  Potassium was 3.3, troponin negative.  EKG showed inferior infarct and she was referred to cardiology.  EKG at the time of her visit showed NSR, 74 bpm.  Review of hospital EKG suggested inferior infarct that was felt to likely be artifact or lead placement.  In October 2019 she complained of episode of sharp substernal pain radiating to right lasting  about 15 minutes.  She was evaluated in the ED with unremarkable workup.  EKG normal, troponin negative x 2, CTA negative for PE.  She underwent outpatient exercise stress test as above.   Patient was last seen in the office by by me on 09/21/2023 for routine follow-up visit possible A-fib.  She reported being woken from sleep with an alert from her smart watch saying she was in A-fib.  She denied cardiac awareness.  She did not normally wear her watch at bedtime and had not worn it since and she had never been alerted during the daytime for possible A-fib.  EKG showed NSR and she was regular rate and rhythm on exam.  Given the isolated episode decision was made to defer outpatient cardiac monitoring unless she received another alert from her smart watch.  Patient presented to the ED on 12/03/2023 via EMS for possible A-fib as well as right sided finger numbness and tingling.  Patient states she woke at 2 AM with numbness to her right finger and thumb along with tingling.  Her watch then notified her of possible abnormal heart rhythm.  EKG demonstrated sinus bradycardia with PACs.  Telemetry demonstrated sinus arrhythmia.  BP was on the soft side and she received IV fluids.  Labs were unremarkable and troponin was negative.  Patient contacted the office and 14-day ZIO was ordered.   Patient was last seen in the office by me on 01/10/2024 for follow-up after testing.  She was  doing well at that time with well-controlled BP.  She was started on Eliquis  for PAF found on heart monitor.  Beta-blocker was not started secondary to baseline bradycardia.  Patient was evaluated by Dr. Daneil Dunker on 01/24/2024.  Decision was made to reassess A-fib burden in 6 months and potentially proceed with ablation.     History of Present Illness    Today, patient is concerned about home BP cuff and using [employer] mobile. She brings all of her equipment today. She states "I thought I was going to die last night." She reported feeling like  her heart was racing and she was "numb all over." She has metoprolol  to take if her heart rate gets over >110 but her BP cuff is downstairs from her bedroom and she was afraid to go down the stairs to get it. She denies chest pain, pressure or tightness. No shortness of breath. Discussed results of echo in detail and all questions answered.     ROS: All other systems reviewed and are otherwise negative except as noted in History of Present Illness.  EKGs/Labs Reviewed       EKG is not performed today.   12/03/2023: ALT 19; AST 21; BUN 24; Creatinine, Ser 0.72; Potassium 3.7; Sodium 142   12/03/2023: Hemoglobin 14.3; WBC 5.3   09/29/2023: TSH 2.06    Risk Assessment/Calculations     CHA2DS2-VASc Score = 5   This indicates a 7.2% annual risk of stroke. The patient's score is based upon: CHF History: 0 HTN History: 1 Diabetes History: 1 Stroke History: 0 Vascular Disease History: 1 Age Score: 1 Gender Score: 1             Physical Exam    VS:  BP 120/78   Pulse 78   Ht 5\' 8"  (1.727 m)   Wt 175 lb 9.6 oz (79.7 kg)   SpO2 94%   BMI 26.70 kg/m  , BMI Body mass index is 26.7 kg/m.  GEN: Well nourished, well developed, in no acute distress. Neck: No JVD or carotid bruits. Cardiac:  RRR. No murmurs. No rubs or gallops.   Respiratory:  Respirations regular and unlabored. Clear to auscultation without rales, wheezing or rhonchi. GI: Soft, nontender, nondistended. Extremities: Radials/DP/PT 2+ and equal bilaterally. No clubbing or cyanosis. No edema.  Skin: Warm and dry, no rash. Neuro: Strength intact.  Assessment & Plan   PAF 14-day ZIO February 2025 showed HR 41 to 198 bpm, average 73 bpm, 5 runs of SVT, 2% A-fib burden with longest lasting 2 hours and 5 minutes with average rate 105 bpm.  Echo April 2025 showed normal LV/RV function with grade I DD, no significant valvular abnormalities.  Denies spontaneous bleeding concerns.  Patient has no cardiac awareness of  arrhythmia. She is very nervous and gets worried when her BP cuff shows her rhythm is irregular. She is also having difficulty using the [facility]. We were able to use it today and I gave her some tips on using it. I also suggested she get a pulse ox to keep in her bedroom to check her HR as needed. Provided reassurance if she is going in and out of afib. Also discussed the numbness she is experiencing is likely due to anxiety. She admits she is anxious. RRR on exam today. - Continue Eliquis    Hypertension BP today 120/78. No headaches or dizziness reported.  -Continue lisinopril .   Hyperlipidemia LDL December 2024 51, at goal. -Continue simvastatin . -Continue to follow with PCP.  Disposition: Follow up in one year or sooner as needed. Keep follow up with EP in October 2025.          Signed, Lonell Rives. Malala Trenkamp, DNP, NP-C

## 2024-02-20 ENCOUNTER — Ambulatory Visit: Attending: Cardiology

## 2024-02-20 ENCOUNTER — Other Ambulatory Visit: Payer: Self-pay | Admitting: Family Medicine

## 2024-02-20 DIAGNOSIS — I480 Paroxysmal atrial fibrillation: Secondary | ICD-10-CM | POA: Diagnosis not present

## 2024-02-20 DIAGNOSIS — E782 Mixed hyperlipidemia: Secondary | ICD-10-CM

## 2024-02-20 DIAGNOSIS — E1159 Type 2 diabetes mellitus with other circulatory complications: Secondary | ICD-10-CM

## 2024-02-20 DIAGNOSIS — I700 Atherosclerosis of aorta: Secondary | ICD-10-CM

## 2024-02-21 ENCOUNTER — Ambulatory Visit: Attending: Student | Admitting: Student

## 2024-02-21 ENCOUNTER — Encounter: Payer: Self-pay | Admitting: Student

## 2024-02-21 VITALS — BP 120/78 | HR 78 | Ht 68.0 in | Wt 175.6 lb

## 2024-02-21 DIAGNOSIS — E7800 Pure hypercholesterolemia, unspecified: Secondary | ICD-10-CM

## 2024-02-21 DIAGNOSIS — I10 Essential (primary) hypertension: Secondary | ICD-10-CM

## 2024-02-21 DIAGNOSIS — I480 Paroxysmal atrial fibrillation: Secondary | ICD-10-CM

## 2024-02-21 LAB — ECHOCARDIOGRAM COMPLETE
AR max vel: 2.36 cm2
AV Area VTI: 2.54 cm2
AV Area mean vel: 2.39 cm2
AV Mean grad: 3 mmHg
AV Peak grad: 6.4 mmHg
Ao pk vel: 1.26 m/s
Area-P 1/2: 2.83 cm2
S' Lateral: 2.82 cm

## 2024-02-21 NOTE — Patient Instructions (Signed)
 Medication Instructions:   Your Physician recommend you continue on your current medication as directed.     *If you need a refill on your cardiac medications before your next appointment, please call your pharmacy*  Lab Work: None ordered If you have labs (blood work) drawn today and your tests are completely normal, you will receive your results only by: MyChart Message (if you have MyChart) OR A paper copy in the mail If you have any lab test that is abnormal or we need to change your treatment, we will call you to review the results.  Testing/Procedures: None ordered  Follow-Up: At [facility], you and your health needs are our priority.  As part of our continuing mission to provide you with exceptional heart care, our providers are all part of one team.  This team includes your primary Cardiologist (physician) and Advanced Practice Providers or APPs (Physician Assistants and Nurse Practitioners) who all work together to provide you with the care you need, when you need it.  Your next appointment:   July 27, 2024 at 9:30 am  Provider:   Suzann Riddle, NP    We recommend signing up for the patient portal called "MyChart".  Sign up information is provided on this After Visit Summary.  MyChart is used to connect with patients for Virtual Visits (Telemedicine).  Patients are able to view lab/test results, encounter notes, upcoming appointments, etc.  Non-urgent messages can be sent to your provider as well.   To learn more about what you can do with MyChart, go to [email or web address].

## 2024-02-22 ENCOUNTER — Encounter: Payer: Self-pay | Admitting: Cardiology

## 2024-02-22 ENCOUNTER — Encounter: Payer: Self-pay | Admitting: Family Medicine

## 2024-02-22 NOTE — Telephone Encounter (Signed)
 Requested Prescriptions  Pending Prescriptions Disp Refills   glucose blood (ONETOUCH ULTRA) test strip [Pharmacy Med Name: OneTouch Ultra In Vitro Strip] 100 strip 0    Sig: USE 1 TEST STRIP DAILY BEFORE  BREAKFAST FOR TYPE 2 DIABETES  MELLITUS     Endocrinology: Diabetes - Testing Supplies Failed - 02/22/2024  2:53 PM      Failed - Valid encounter within last 12 months    Recent Outpatient Visits   None     Future Appointments             In 1 month Rockney Cid, DO [facility], [facility]   In 5 months Riddle, Suzann, NP [facility]             simvastatin  (ZOCOR ) 40 MG tablet [Pharmacy Med Name: Simvastatin  40 MG Oral Tablet] 100 tablet 0    Sig: TAKE 1 TABLET BY MOUTH AT  BEDTIME     Cardiovascular:  Antilipid - Statins Failed - 02/22/2024  2:53 PM      Failed - Valid encounter within last 12 months    Recent Outpatient Visits   None     Future Appointments             In 1 month Rockney Cid, DO [facility]   In 5 months Riddle, Suzann, NP [facility]            Failed - Lipid Panel in normal range within the last 12 months    Cholesterol, Total  Date Value Ref Range Status  11/27/2015 143 100 - 199 mg/dL Final   Cholesterol  Date Value Ref Range Status  09/29/2023 128 <200 mg/dL Final   LDL Cholesterol (Calc)  Date Value Ref Range Status  09/29/2023 51 mg/dL (calc) Final    Comment:    Reference range: <100 . Desirable range <100 mg/dL for primary prevention;   <70 mg/dL for patients with CHD or diabetic patients  with > or = 2 CHD risk factors. Aaron Aas LDL-C is now calculated using the Martin-Hopkins  calculation, which is a validated novel method providing  better accuracy than the Friedewald equation in the  estimation of LDL-C.  Melinda Sprawls et al. Erroll Heard. 1610;960(45): 2061-2068  (http://education.[employer].com/faq/FAQ164)    HDL   Date Value Ref Range Status  09/29/2023 55 > OR = 50 mg/dL Final  40/98/1191 48 >47 mg/dL Final   Triglycerides  Date Value Ref Range Status  09/29/2023 132 <150 mg/dL Final         Passed - Patient is not pregnant

## 2024-02-23 ENCOUNTER — Other Ambulatory Visit: Payer: Self-pay | Admitting: Family Medicine

## 2024-02-23 DIAGNOSIS — E782 Mixed hyperlipidemia: Secondary | ICD-10-CM

## 2024-02-23 DIAGNOSIS — I700 Atherosclerosis of aorta: Secondary | ICD-10-CM

## 2024-02-23 DIAGNOSIS — H902 Conductive hearing loss, unspecified: Secondary | ICD-10-CM | POA: Diagnosis not present

## 2024-02-23 DIAGNOSIS — H6123 Impacted cerumen, bilateral: Secondary | ICD-10-CM | POA: Diagnosis not present

## 2024-03-06 DIAGNOSIS — E119 Type 2 diabetes mellitus without complications: Secondary | ICD-10-CM | POA: Diagnosis not present

## 2024-03-06 DIAGNOSIS — M2042 Other hammer toe(s) (acquired), left foot: Secondary | ICD-10-CM | POA: Diagnosis not present

## 2024-04-02 ENCOUNTER — Other Ambulatory Visit: Payer: Self-pay | Admitting: Cardiology

## 2024-04-03 ENCOUNTER — Other Ambulatory Visit: Payer: Self-pay

## 2024-04-03 ENCOUNTER — Ambulatory Visit (INDEPENDENT_AMBULATORY_CARE_PROVIDER_SITE_OTHER): Payer: Self-pay | Admitting: Internal Medicine

## 2024-04-03 ENCOUNTER — Ambulatory Visit: Payer: Medicare Other | Admitting: Physician Assistant

## 2024-04-03 ENCOUNTER — Encounter: Payer: Self-pay | Admitting: Internal Medicine

## 2024-04-03 VITALS — BP 126/84 | HR 81 | Temp 98.0°F | Resp 16 | Ht 68.0 in | Wt 174.7 lb

## 2024-04-03 DIAGNOSIS — M7989 Other specified soft tissue disorders: Secondary | ICD-10-CM

## 2024-04-03 DIAGNOSIS — I10 Essential (primary) hypertension: Secondary | ICD-10-CM

## 2024-04-03 DIAGNOSIS — R7303 Prediabetes: Secondary | ICD-10-CM | POA: Diagnosis not present

## 2024-04-03 DIAGNOSIS — I480 Paroxysmal atrial fibrillation: Secondary | ICD-10-CM | POA: Diagnosis not present

## 2024-04-03 DIAGNOSIS — E1159 Type 2 diabetes mellitus with other circulatory complications: Secondary | ICD-10-CM | POA: Diagnosis not present

## 2024-04-03 DIAGNOSIS — I4891 Unspecified atrial fibrillation: Secondary | ICD-10-CM | POA: Diagnosis not present

## 2024-04-03 NOTE — Progress Notes (Signed)
 Established Patient Office Visit  Subjective:  Patient ID: Felicia Cisneros, female    DOB: 1950/11/12  Age: 73 y.o. MRN: [id number]  CC:  Chief Complaint  Patient presents with   Medical Management of Chronic Issues    6 month recheck    HPI Felicia Cisneros presents for follow up on chronic medical conditions.   Discussed the use of AI scribe software for clinical note transcription with the patient, who gave verbal consent to proceed.  History of Present Illness Felicia Cisneros is a 73 year old female with atrial fibrillation who presents with leg and ankle swelling.  She experiences significant leg and ankle swelling, which she attributes to not wearing her compression stockings for the past two days. Her ankles felt swollen last night, prompting her to wear the stockings overnight, which provided relief.  She has atrial fibrillation, initially detected by her watch. She uses a finger device to monitor her heart rate and takes metoprolol  as needed if her heart rate exceeds 110 bpm. She is currently on Eliquis , taking it twice daily, and no longer takes aspirin . She experiences occasional numbness in one arm.  She has a history of anxiety attacks, leading to multiple hospital visits. She is not currently taking metformin , as her blood sugar levels are within normal range. Her current medications include simvastatin , famotidine , lisinopril , and Eliquis . She also takes supplements such as CoQ10 and Nureva Ultra and uses clobetasol  cream for skin issues.  She recently returned from a trip to [place], where she engaged in significant physical activity, including walking the [place]. She quit smoking 15 years ago and undergoes annual lung cancer screenings. No shortness of breath.   Hypertension/New Onset A.Fib: -Medications: Lisinopril  5, Metoprolol  25 mg PRN HR >110, Eliquis  5 mg BID -Patient is compliant with above medications and reports no side effects. -Following with  Cardiology   HLD: -Medications: Zocor  40 -Patient is compliant with above medications and reports no side effects.  -Last lipid panel: Lipid Panel     Component Value Date/Time   CHOL 128 09/29/2023 0948   CHOL 143 11/27/2015 0957   TRIG 132 09/29/2023 0948   HDL 55 09/29/2023 0948   HDL 48 11/27/2015 0957   CHOLHDL 2.3 09/29/2023 0948   VLDL 23 06/09/2017 0912   LDLCALC 51 09/29/2023 0948   LABVLDL 28 11/27/2015 0957    Pre-Diabetes: -Last A1c 6.2% 12/24 -Medications: Metformin  500 XR - no longer taking   Past Medical History:  Diagnosis Date   Aortic atherosclerosis (HCC)    a. 11/2017 noted on Chest CT - Aortic and branch vessel atherosclerosis.   Atrophic vaginitis    Centrilobular emphysema (HCC) 12/02/2016   Chest pain    a. 06/2013 ETT: Ex time 6:00, Max HR 136 (87%), 7.1 METS. No ECG changes; b. 07/2018 MV: Ex time: 6:42, nl EF, No ischemia.   COPD (chronic obstructive pulmonary disease) (HCC)    a. 11/2017 Chest CT: moderate centrilobular emphysema.   Diabetes mellitus without complication (HCC)    Diverticulitis    DNAR (do not attempt resuscitation) 06/09/2017   Discussed today, 06/09/2017   Essential hypertension    GERD (gastroesophageal reflux disease)    History of kidney stones    History of tobacco use    Hyperlipidemia    IFG (impaired fasting glucose)    Irregular heart beat 2018   Leukorrhea, not specified as infective    Osteopenia 05/27/2016   August 2017   Personal history  of tobacco use, presenting hazards to health 08/20/2015   Pulmonary nodules    a. 11/2017 CT Chest: No change in bilat pulm nodules, including an ant LUL nodule - 5.29mm.   Thyroid  nodule 12/09/2022    Past Surgical History:  Procedure Laterality Date   colonoscopy     COLONOSCOPY WITH PROPOFOL  N/A 02/20/2018   Procedure: COLONOSCOPY WITH PROPOFOL ;  Surgeon: Marnee Sink, MD;  Location: [facility];  Service: Endoscopy;  Laterality: N/A;  Diabetic   CYSTOSCOPY WITH  BIOPSY N/A 01/17/2017   Procedure: CYSTOSCOPY WITH BIOPSY;  Surgeon: Dustin Gimenez, MD;  Location: [facility];  Service: Urology;  Laterality: N/A;   POLYPECTOMY  02/20/2018   Procedure: POLYPECTOMY;  Surgeon: Marnee Sink, MD;  Location: [facility];  Service: Endoscopy;;   TMJ ARTHROPLASTY  1984    Family History  Problem Relation Age of Onset   Stroke Mother    Hypertension Mother    Hyperlipidemia Mother    Diabetes Mother        pre-diabetic   Hearing loss Mother    Diabetes Brother    Irregular heart beat Brother    COPD Father    Hearing loss Father    Thyroid  disease Sister    Stroke Maternal Grandmother    Hyperlipidemia Maternal Grandmother    Hypertension Maternal Grandmother    Cancer Maternal Grandfather        bone   Cancer Sister        cystic fibroid carcinoma   Ulcerative colitis Sister    Heart disease Paternal Uncle    Heart disease Paternal Uncle    Hyperlipidemia Maternal Aunt    Stroke Maternal Aunt    Breast cancer Neg Hx    Hematuria Neg Hx    Prostate cancer Neg Hx    Renal cancer Neg Hx     Social History   Socioeconomic History   Marital status: Widowed    Spouse name: Laney Piper   Number of children: 1   Years of education: some college   Highest education level: 12th grade  Occupational History    Employer: [employer]  Tobacco Use   Smoking status: Former    Current packs/day: 0.00    Average packs/day: 3.0 packs/day for 45.0 years (135.0 ttl pk-yrs)    Types: Cigarettes    Start date: 10/25/1964    Quit date: 10/25/2009    Years since quitting: 14.4   Smokeless tobacco: Never   Tobacco comments:    Quit before my husband was diagnosed with small cell cancer.  Vaping Use   Vaping status: Never Used  Substance and Sexual Activity   Alcohol use: Not Currently    Comment: Rarely   Drug use: No   Sexual activity: Not Currently    Birth control/protection: None  Other Topics Concern   Not on file  Social  History Narrative   Not on file   Social Drivers of Health   Financial Resource Strain: Low Risk  (04/02/2024)   Overall Financial Resource Strain (CARDIA)    Difficulty of Paying Living Expenses: Not hard at all  Food Insecurity: No Food Insecurity (04/02/2024)   Hunger Vital Sign    Worried About Running Out of Food in the Last Year: Never true    Ran Out of Food in the Last Year: Never true  Transportation Needs: No Transportation Needs (04/02/2024)   PRAPARE - Transportation    Lack of Transportation (Medical): No    Lack  of Transportation (Non-Medical): No  Physical Activity: Insufficiently Active (04/02/2024)   Exercise Vital Sign    Days of Exercise per Week: 3 days    Minutes of Exercise per Session: 30 min  Stress: Stress Concern Present (04/02/2024)   [employer] of Occupational Health - Occupational Stress Questionnaire    Feeling of Stress : To some extent  Social Connections: Unknown (04/02/2024)   Social Connection and Isolation Panel [NHANES]    Frequency of Communication with Friends and Family: Three times a week    Frequency of Social Gatherings with Friends and Family: Twice a week    Attends Religious Services: Patient declined    Database administrator or Organizations: Yes    Attends Engineer, structural: More than 4 times per year    Marital Status: Widowed  Intimate Partner Violence: Not At Risk (09/09/2022)   Humiliation, Afraid, Rape, and Kick questionnaire    Fear of Current or Ex-Partner: No    Emotionally Abused: No    Physically Abused: No    Sexually Abused: No    Outpatient Medications Prior to Visit  Medication Sig Dispense Refill   acetaminophen  (TYLENOL ) 500 MG tablet Take 1,000 mg by mouth every 6 (six) hours as needed (for pain.).     apixaban  (ELIQUIS ) 5 MG TABS tablet Take 1 tablet (5 mg total) by mouth 2 (two) times daily. 180 tablet 1   BIOTIN PO Take by mouth.     Blood Glucose Monitoring Suppl DEVI May substitute to any  manufacturer covered by patient's insurance. Use glucometer as directed 1 each 0   clobetasol  cream (TEMOVATE ) 0.05 % Apply to labia twice daily for 3 weeks then twice weekly as directed 60 g 2   Coenzyme Q10-Vitamin E (QUNOL ULTRA COQ10) 100-150 MG-UNIT CAPS      famotidine  (PEPCID ) 20 MG tablet TAKE 1 TABLET BY MOUTH TWICE  DAILY AS NEEDED FOR HEARTBURN OR INDIGESTION 200 tablet 2   lisinopril  (ZESTRIL ) 5 MG tablet TAKE 1 TABLET BY MOUTH DAILY FOR BLOOD PRESSURE 90 tablet 3   Melatonin 2.5 MG CHEW Chew 2.5 mg by mouth at bedtime.     metoprolol  tartrate (LOPRESSOR ) 25 MG tablet TAKE 1 TABLET BY MOUTH DAILY AS  NEEDED FOR SUSTAINED HEART RATE  GREATER THAN 110 100 tablet 0   Misc Natural Products (NEURIVA PO) Take by mouth.     Multiple Vitamins-Minerals (CENTRUM SILVER 50+WOMEN) TABS Take 1 tablet by mouth daily.     simvastatin  (ZOCOR ) 40 MG tablet TAKE 1 TABLET BY MOUTH AT  BEDTIME 100 tablet 0   glucose blood (ONETOUCH ULTRA) test strip USE 1 TEST STRIP DAILY BEFORE  BREAKFAST FOR TYPE 2 DIABETES  MELLITUS (Patient not taking: Reported on 04/03/2024) 100 strip 0   Lancet Device MISC 1 each by Does not apply route daily before breakfast. May substitute to any manufacturer covered by patient's insurance. (Patient not taking: Reported on 04/03/2024) 100 each 1   Omega-3 Fatty Acids (FISH OIL OMEGA-3 PO) Take by mouth.     Facility-Administered Medications Prior to Visit  Medication Dose Route Frequency Provider Last Rate Last Admin   clobetasol  ointment (TEMOVATE ) 0.05 %   Topical BID Zenobia Hila, MD        No Known Allergies  ROS Review of Systems  Respiratory:  Negative for cough and shortness of breath.   Cardiovascular:  Positive for leg swelling. Negative for chest pain and palpitations.      Objective:  Physical Exam Constitutional:      Appearance: Normal appearance.  HENT:     Head: Normocephalic and atraumatic.  Eyes:     Conjunctiva/sclera: Conjunctivae normal.   Cardiovascular:     Rate and Rhythm: Normal rate and regular rhythm.  Pulmonary:     Effort: Pulmonary effort is normal.     Breath sounds: Normal breath sounds.  Musculoskeletal:     Comments: Mild non-pitting edema in BLE  Skin:    General: Skin is warm and dry.  Neurological:     General: No focal deficit present.     Mental Status: She is alert. Mental status is at baseline.  Psychiatric:        Mood and Affect: Mood normal.        Behavior: Behavior normal.     BP 126/84 (Cuff Size: Large)   Pulse 81   Temp 98 F (36.7 C) (Oral)   Resp 16   Ht 5\' 8"  (1.727 m)   Wt 174 lb 11.2 oz (79.2 kg)   SpO2 95%   BMI 26.56 kg/m  Wt Readings from Last 3 Encounters:  04/03/24 174 lb 11.2 oz (79.2 kg)  02/21/24 175 lb 9.6 oz (79.7 kg)  01/24/24 176 lb 6 oz (80 kg)     Health Maintenance Due  Topic Date Due   Diabetic kidney evaluation - Urine ACR  12/16/2023   COVID-19 Vaccine (10 - 2024-25 season) 01/10/2024   HEMOGLOBIN A1C  03/29/2024   Lung Cancer Screening  04/10/2024    There are no preventive care reminders to display for this patient.  Lab Results  Component Value Date   TSH 2.06 09/29/2023   Lab Results  Component Value Date   WBC 5.3 12/03/2023   HGB 14.3 12/03/2023   HCT 43.1 12/03/2023   MCV 92.3 12/03/2023   PLT 208 12/03/2023   Lab Results  Component Value Date   NA 142 12/03/2023   K 3.7 12/03/2023   CO2 24 12/03/2023   GLUCOSE 125 (H) 12/03/2023   BUN 24 (H) 12/03/2023   CREATININE 0.72 12/03/2023   BILITOT 0.6 12/03/2023   ALKPHOS 61 12/03/2023   AST 21 12/03/2023   ALT 19 12/03/2023   PROT 6.0 (L) 12/03/2023   ALBUMIN 3.4 (L) 12/03/2023   CALCIUM 8.5 (L) 12/03/2023   ANIONGAP 10 12/03/2023   EGFR 76 09/29/2023   Lab Results  Component Value Date   CHOL 128 09/29/2023   Lab Results  Component Value Date   HDL 55 09/29/2023   Lab Results  Component Value Date   LDLCALC 51 09/29/2023   Lab Results  Component Value Date    TRIG 132 09/29/2023   Lab Results  Component Value Date   CHOLHDL 2.3 09/29/2023   Lab Results  Component Value Date   HGBA1C 6.2 (H) 09/29/2023      Assessment & Plan:   Assessment & Plan Peripheral Edema Intermittent leg and ankle swelling likely due to gravity-dependent edema and potential fluid retention. Compression stockings provide relief. Kidney function and BNP levels will be assessed to exclude heart failure. Sodium restriction advised. Diuretic therapy may be considered if lab results are normal. - Order kidney function tests and BNP test. - Advise use of compression stockings during the day. - Advise sodium restriction. - Consider diuretic therapy if lab results are normal.  Atrial Fibrillation Paroxysmal atrial fibrillation detected via smartwatch. Currently on metoprolol  as needed and Eliquis  for anticoagulation. Not in atrial fibrillation at present.  Educated on paroxysmal atrial fibrillation and the importance of heart rhythm monitoring. - Continue Eliquis . - Continue metoprolol  as needed. - Monitor heart rhythm.  Pre-Diabetes Metformin  discontinued due to adequate glycemic control by Cardiology. A1c will be re-evaluated. Discussed potential low-dose metformin  to prevent progression if necessary. - Order A1c test.  HTN Stable, continue Lisinopril .    - B Nat Peptide - Comprehensive Metabolic Panel (CMET) - HgB A1c  Follow-up: Return in about 6 months (around 10/03/2024).    Rockney Cid, DO

## 2024-04-04 LAB — COMPREHENSIVE METABOLIC PANEL WITH GFR
AG Ratio: 2.3 (calc) (ref 1.0–2.5)
ALT: 15 U/L (ref 6–29)
AST: 16 U/L (ref 10–35)
Albumin: 4.2 g/dL (ref 3.6–5.1)
Alkaline phosphatase (APISO): 69 U/L (ref 37–153)
BUN: 18 mg/dL (ref 7–25)
CO2: 28 mmol/L (ref 20–32)
Calcium: 9.1 mg/dL (ref 8.6–10.4)
Chloride: 107 mmol/L (ref 98–110)
Creat: 0.74 mg/dL (ref 0.60–1.00)
Globulin: 1.8 g/dL — ABNORMAL LOW (ref 1.9–3.7)
Glucose, Bld: 108 mg/dL — ABNORMAL HIGH (ref 65–99)
Potassium: 5 mmol/L (ref 3.5–5.3)
Sodium: 142 mmol/L (ref 135–146)
Total Bilirubin: 0.7 mg/dL (ref 0.2–1.2)
Total Protein: 6 g/dL — ABNORMAL LOW (ref 6.1–8.1)
eGFR: 85 mL/min/{1.73_m2} (ref >=60)

## 2024-04-04 LAB — HEMOGLOBIN A1C
Hgb A1c MFr Bld: 6.1 % — ABNORMAL HIGH (ref <5.7)
Mean Plasma Glucose: 128 mg/dL
eAG (mmol/L): 7.1 mmol/L

## 2024-04-04 LAB — BRAIN NATRIURETIC PEPTIDE: Brain Natriuretic Peptide: 44 pg/mL (ref <100)

## 2024-04-06 ENCOUNTER — Ambulatory Visit: Payer: Self-pay | Admitting: Internal Medicine

## 2024-04-09 ENCOUNTER — Encounter: Payer: Self-pay | Admitting: Family Medicine

## 2024-04-12 ENCOUNTER — Ambulatory Visit
Admission: RE | Admit: 2024-04-12 | Discharge: 2024-04-12 | Disposition: A | Discharge status: Home / Self Care | Source: Ambulatory Visit | Attending: Acute Care | Admitting: Acute Care

## 2024-04-12 ENCOUNTER — Ambulatory Visit: Admission: RE | Admit: 2024-04-12 | Source: Ambulatory Visit

## 2024-04-12 DIAGNOSIS — D485 Neoplasm of uncertain behavior of skin: Secondary | ICD-10-CM | POA: Diagnosis not present

## 2024-04-12 DIAGNOSIS — C44629 Squamous cell carcinoma of skin of left upper limb, including shoulder: Secondary | ICD-10-CM | POA: Diagnosis not present

## 2024-04-12 DIAGNOSIS — Z87891 Personal history of nicotine dependence: Secondary | ICD-10-CM | POA: Insufficient documentation

## 2024-04-12 DIAGNOSIS — Z122 Encounter for screening for malignant neoplasm of respiratory organs: Secondary | ICD-10-CM | POA: Insufficient documentation

## 2024-04-27 ENCOUNTER — Emergency Department

## 2024-04-27 ENCOUNTER — Emergency Department
Admission: EM | Admit: 2024-04-27 | Discharge: 2024-04-27 | Disposition: A | Discharge status: Home / Self Care | Attending: Emergency Medicine | Admitting: Emergency Medicine

## 2024-04-27 ENCOUNTER — Other Ambulatory Visit: Payer: Self-pay

## 2024-04-27 DIAGNOSIS — Z7901 Long term (current) use of anticoagulants: Secondary | ICD-10-CM | POA: Insufficient documentation

## 2024-04-27 DIAGNOSIS — R350 Frequency of micturition: Secondary | ICD-10-CM | POA: Insufficient documentation

## 2024-04-27 DIAGNOSIS — Z79899 Other long term (current) drug therapy: Secondary | ICD-10-CM | POA: Insufficient documentation

## 2024-04-27 DIAGNOSIS — E860 Dehydration: Secondary | ICD-10-CM | POA: Diagnosis not present

## 2024-04-27 DIAGNOSIS — I10 Essential (primary) hypertension: Secondary | ICD-10-CM | POA: Diagnosis not present

## 2024-04-27 DIAGNOSIS — Z0389 Encounter for observation for other suspected diseases and conditions ruled out: Secondary | ICD-10-CM | POA: Diagnosis not present

## 2024-04-27 DIAGNOSIS — R519 Headache, unspecified: Secondary | ICD-10-CM | POA: Diagnosis not present

## 2024-04-27 DIAGNOSIS — I499 Cardiac arrhythmia, unspecified: Secondary | ICD-10-CM | POA: Diagnosis not present

## 2024-04-27 DIAGNOSIS — I480 Paroxysmal atrial fibrillation: Secondary | ICD-10-CM | POA: Insufficient documentation

## 2024-04-27 DIAGNOSIS — R42 Dizziness and giddiness: Secondary | ICD-10-CM

## 2024-04-27 LAB — URINALYSIS, W/ REFLEX TO CULTURE (INFECTION SUSPECTED)
Bilirubin Urine: NEGATIVE
Glucose, UA: NEGATIVE mg/dL
Ketones, ur: NEGATIVE mg/dL
Leukocytes,Ua: NEGATIVE
Nitrite: NEGATIVE
Protein, ur: NEGATIVE mg/dL
Specific Gravity, Urine: 1.008 (ref 1.005–1.030)
Squamous Epithelial / HPF: 0 /HPF (ref 0–5)
pH: 6 (ref 5.0–8.0)

## 2024-04-27 LAB — COMPREHENSIVE METABOLIC PANEL WITH GFR
ALT: 18 U/L (ref 0–44)
AST: 33 U/L (ref 15–41)
Albumin: 3.8 g/dL (ref 3.5–5.0)
Alkaline Phosphatase: 71 U/L (ref 38–126)
Anion gap: 9 (ref 5–15)
BUN: 19 mg/dL (ref 8–23)
CO2: 25 mmol/L (ref 22–32)
Calcium: 9.6 mg/dL (ref 8.9–10.3)
Chloride: 108 mmol/L (ref 98–111)
Creatinine, Ser: 0.69 mg/dL (ref 0.44–1.00)
GFR, Estimated: >60 mL/min (ref >60)
Glucose, Bld: 127 mg/dL — ABNORMAL HIGH (ref 70–99)
Potassium: 4.4 mmol/L (ref 3.5–5.1)
Sodium: 142 mmol/L (ref 135–145)
Total Bilirubin: 1.1 mg/dL (ref 0.0–1.2)
Total Protein: 6.5 g/dL (ref 6.5–8.1)

## 2024-04-27 LAB — CBC WITH DIFFERENTIAL/PLATELET
Abs Immature Granulocytes: 0.01 K/uL (ref 0.00–0.07)
Basophils Absolute: 0.1 K/uL (ref 0.0–0.1)
Basophils Relative: 1 %
Eosinophils Absolute: 0.1 K/uL (ref 0.0–0.5)
Eosinophils Relative: 1 %
HCT: 45.9 % (ref 36.0–46.0)
Hemoglobin: 15.5 g/dL — ABNORMAL HIGH (ref 12.0–15.0)
Immature Granulocytes: 0 %
Lymphocytes Relative: 19 %
Lymphs Abs: 1.2 K/uL (ref 0.7–4.0)
MCH: 30.7 pg (ref 26.0–34.0)
MCHC: 33.8 g/dL (ref 30.0–36.0)
MCV: 90.9 fL (ref 80.0–100.0)
Monocytes Absolute: 0.3 K/uL (ref 0.1–1.0)
Monocytes Relative: 5 %
Neutro Abs: 4.7 K/uL (ref 1.7–7.7)
Neutrophils Relative %: 74 %
Platelets: 185 K/uL (ref 150–400)
RBC: 5.05 MIL/uL (ref 3.87–5.11)
RDW: 12.9 % (ref 11.5–15.5)
WBC: 6.3 K/uL (ref 4.0–10.5)
nRBC: 0 % (ref 0.0–0.2)

## 2024-04-27 LAB — RESP PANEL BY RT-PCR (RSV, FLU A&B, COVID)  RVPGX2
Influenza A by PCR: NEGATIVE
Influenza B by PCR: NEGATIVE
Resp Syncytial Virus by PCR: NEGATIVE
SARS Coronavirus 2 by RT PCR: NEGATIVE

## 2024-04-27 LAB — PROTIME-INR
INR: 1 (ref 0.8–1.2)
Prothrombin Time: 13.8 s (ref 11.4–15.2)

## 2024-04-27 LAB — LACTIC ACID, PLASMA: Lactic Acid, Venous: 1.7 mmol/L (ref 0.5–1.9)

## 2024-04-27 LAB — TROPONIN I (HIGH SENSITIVITY): Troponin I (High Sensitivity): 3 ng/L (ref <18)

## 2024-04-27 MED ORDER — ACETAMINOPHEN 500 MG PO TABS
1000.0000 mg | ORAL_TABLET | Freq: Once | ORAL | Status: AC
  Administered 2024-04-27: 1000 mg via ORAL
  Filled 2024-04-27: qty 2

## 2024-04-27 MED ORDER — SODIUM CHLORIDE 0.9 % IV BOLUS
1000.0000 mL | Freq: Once | INTRAVENOUS | Status: AC
  Administered 2024-04-27: 1000 mL via INTRAVENOUS

## 2024-04-27 NOTE — ED Provider Notes (Signed)
 [facility] Provider Note    Event Date/Time   First MD Initiated Contact with Patient 04/27/24 [phone]     (approximate)   History   Hypotension (Positive orthostatics )   HPI  Felicia Cisneros is a 73 y.o. female past medical history significant for paroxysmal atrial fibrillation on Eliquis  and metoprolol , presents to the emergency department with hypotension.  Patient states that she started not feeling well last night.  Complaining of a headache and frequent urination.  States that she started feeling dizzy and lightheaded.  When EMS arrived they checked orthostatic blood pressures and they were positive.  Also noted that she was an irregular rhythm with a heart rate in the 130s.  Patient does state that she has been followed by cardiology for questionable atrial fibrillation and is on Eliquis .  Denies nausea, vomiting or diarrhea.  Normal p.o. intake.  No fever or chills.  No recent changes to home medications.     Physical Exam   Triage Vital Signs: ED Triage Vitals  Encounter Vitals Group     BP 04/27/24 0915 (!) 143/83     Girls Systolic BP Percentile --      Girls Diastolic BP Percentile --      Boys Systolic BP Percentile --      Boys Diastolic BP Percentile --      Pulse Rate 04/27/24 0915 64     Resp 04/27/24 0915 20     Temp 04/27/24 0914 98.4 F (36.9 C)     Temp Source 04/27/24 0914 Oral     SpO2 04/27/24 0915 98 %     Weight 04/27/24 0904 176 lb 9.6 oz (80.1 kg)     Height 04/27/24 0904 5' 8 (1.727 m)     Head Circumference --      Peak Flow --      Pain Score 04/27/24 0904 10     Pain Loc --      Pain Education --      Exclude from Growth Chart --     Most recent vital signs: Vitals:   04/27/24 0915 04/27/24 0930  BP: (!) 143/83 125/78  Pulse: 64 74  Resp: 20 16  Temp:    SpO2: 98% 97%    Physical Exam Constitutional:      Appearance: She is well-developed.  HENT:     Head: Atraumatic.  Eyes:     Conjunctiva/sclera:  Conjunctivae normal.  Cardiovascular:     Rate and Rhythm: Regular rhythm.  Pulmonary:     Effort: No respiratory distress.  Abdominal:     General: There is no distension.  Musculoskeletal:        General: Normal range of motion.     Cervical back: Normal range of motion.  Skin:    General: Skin is warm.     Capillary Refill: Capillary refill takes less than 2 seconds.  Neurological:     Mental Status: She is alert. Mental status is at baseline.     IMPRESSION / MDM / ASSESSMENT AND PLAN / ED COURSE  I reviewed the triage vital signs and the nursing notes.  Differential diagnosis including dehydration, electrolyte abnormality, dysrhythmia, anemia, urinary tract infection, ACS, infection, sepsis, viral illness including COVID/influenza  On arrival afebrile, hemodynamically stable.  EKG  I, Clotilda Punter, the attending physician, personally viewed and interpreted this ECG.   Rate: Normal  Rhythm: Normal sinus  Axis: Normal  Intervals: Normal  ST&T Change: None  No  tachycardic or bradycardic dysrhythmias while on cardiac telemetry.  RADIOLOGY I independently reviewed imaging, my interpretation of imaging: Chest x-ray with no focal findings of pneumonia, no widened mediastinum and no pulmonary edema  CT scan of the head -no signs of intracranial hemorrhage.  Read as no acute findings.  LABS (all labs ordered are listed, but only abnormal results are displayed) Labs interpreted as -    Labs Reviewed  COMPREHENSIVE METABOLIC PANEL WITH GFR - Abnormal; Notable for the following components:      Result Value   Glucose, Bld 127 (*)    All other components within normal limits  CBC WITH DIFFERENTIAL/PLATELET - Abnormal; Notable for the following components:   Hemoglobin 15.5 (*)    All other components within normal limits  URINALYSIS, W/ REFLEX TO CULTURE (INFECTION SUSPECTED) - Abnormal; Notable for the following components:   Color, Urine STRAW (*)    APPearance  CLEAR (*)    Hgb urine dipstick MODERATE (*)    Bacteria, UA RARE (*)    All other components within normal limits  RESP PANEL BY RT-PCR (RSV, FLU A&B, COVID)  [id number]  CULTURE, BLOOD (SINGLE)  LACTIC ACID, PLASMA  PROTIME-INR  TROPONIN I (HIGH SENSITIVITY)     MDM  Patient given IV fluids.  No significant leukocytosis or anemia.  Creatinine at baseline.  No significant electrolyte abnormality.  No findings of a urinary tract infection.  Normal lactic acid at 1.7.  No findings of COVID or influenza.  After IV fluids orthostatic blood pressures are negative.  Ambulating with no difficulties.  Headache has resolved.  Likely some component of dehydration and anxiety.  Discussed close follow-up with primary care physician.  Discussed oral hydration.  Given return precautions for any ongoing or worsening symptoms.     PROCEDURES:  Critical Care performed: No  Procedures  Patient's presentation is most consistent with acute presentation with potential threat to life or bodily function.   MEDICATIONS ORDERED IN ED: Medications  sodium chloride  0.9 % bolus 1,000 mL (0 mLs Intravenous Stopped 04/27/24 1119)  acetaminophen  (TYLENOL ) tablet 1,000 mg (1,000 mg Oral Given 04/27/24 1031)    FINAL CLINICAL IMPRESSION(S) / ED DIAGNOSES   Final diagnoses:  Lightheaded  Dehydration     Rx / DC Orders   ED Discharge Orders     None        Note:  This document was prepared using Dragon voice recognition software and may include unintentional dictation errors.   Suzanne Kirsch, MD 04/27/24 1152

## 2024-04-27 NOTE — ED Notes (Signed)
 Lab tells this RN they can add on Trop to Pts labs already sent down.

## 2024-04-27 NOTE — Discharge Instructions (Addendum)
 You are seen in the emergency department for feeling lightheaded and not feeling well.  Your blood pressures were low with standing.  You were given IV fluids and your blood pressure improved.  You likely were dehydrated.  You had no findings of urinary tract infection.  Your lab work was overall normal.  Follow-up closely with your primary care physician.  Stay hydrated and drink plenty of fluids.  Return to the emergency department if you have any worsening symptoms.

## 2024-04-27 NOTE — ED Triage Notes (Signed)
 Arrives via EMS from home for orthostatic EMS assessment: BP went from sitting 127/68 with a HR 108 to standing 96/49 HR 133, irreg. HR C/o HA, feeling like her heart is pounding but no chest pain,  EMS Vitals: 146/87, HR 109, CBG 156, 97% RA, RR 16 Med Hx: Emphysema, skin cancer, HTN, Hyperlipidemia,  EMS Tx: #20 RFA, 500 mL of LR

## 2024-04-28 ENCOUNTER — Encounter: Payer: Self-pay | Admitting: Family Medicine

## 2024-04-29 ENCOUNTER — Encounter: Payer: Self-pay | Admitting: Cardiovascular Disease

## 2024-04-29 ENCOUNTER — Encounter: Payer: Self-pay | Admitting: Family Medicine

## 2024-05-01 ENCOUNTER — Ambulatory Visit: Admitting: Family Medicine

## 2024-05-01 ENCOUNTER — Encounter: Payer: Self-pay | Admitting: Family Medicine

## 2024-05-01 VITALS — BP 126/72 | HR 90 | Resp 16 | Ht 68.0 in | Wt 176.0 lb

## 2024-05-01 DIAGNOSIS — R233 Spontaneous ecchymoses: Secondary | ICD-10-CM | POA: Diagnosis not present

## 2024-05-01 DIAGNOSIS — T148XXA Other injury of unspecified body region, initial encounter: Secondary | ICD-10-CM

## 2024-05-01 NOTE — Progress Notes (Signed)
 Patient ID: Felicia Cisneros, female    DOB: [birth year], 73 y.o.   MRN: [id number]  PCP: Leavy Mole, PA-C  Chief Complaint  Patient presents with   Bleeding/Bruising    Has been bruising easily. Patient is taking Eliquis  and aware it can cause easier bruising.    Subjective:   Felicia Cisneros is a 73 y.o. female, presents to clinic with CC of the following:  HPI  Here for bruising bruise to left wrist and right arm from IV She is on eliquis , anticoagulated since March this year about 4 months ago for pAfib  CHA2DS2-VASc Score = 5   This indicates a 7.2% annual risk of stroke. The patient's score is based upon: CHF History: 0 HTN History: 1 Diabetes History: 1 Stroke History: 0 Vascular Disease History: 1 Age Score: 1 Gender Score: 1  PAF 14-day ZIO February 2025 showed HR 41 to 198 bpm, average 73 bpm, 5 runs of SVT, 2% A-fib burden with longest lasting 2 hours and 5 minutes with average rate 105 bpm.  Patient has no cardiac awareness of arrhythmia.  EKG today demonstrates sinus bradycardia with sinus arrhythmia. CHA2DS2-VASc 5. -Start Eliquis  5 mg twice daily. -Will not start BB secondary to bradycardia.  -Refer to EP.   Hypertension BP today 102/70. No headaches or dizziness reported.  -Continue lisinopril .   Hyperlipidemia LDL December 2024 51, at goal. -Continue simvastatin . -Continue to follow with PCP.   Disposition: Eliquis  5 mg bid. EP follow up. Return in 1 year or sooner as needed.    No other places of bruising or rash/petechia No hematuria, no blood in stool, no bleeding when brushing teeth  Patient Active Problem List   Diagnosis Date Noted   Thyroid  nodule 12/09/2022   Upper respiratory tract infection due to COVID-19 virus 10/12/2022   Retinal detachment with single break, right 12/31/2020   GERD without esophagitis 03/06/2019   Benign neoplasm of sigmoid colon    Centrilobular emphysema (HCC) 12/02/2016   Pulmonary nodules/lesions,  multiple 12/02/2016   Aortic atherosclerosis (HCC) 07/08/2016   Osteopenia 05/27/2016   Essential hypertension    Type 2 diabetes mellitus, controlled (HCC)    Atrophic vaginitis    History of tobacco use    Hyperlipidemia 07/13/2013      Current Outpatient Medications:    acetaminophen  (TYLENOL ) 500 MG tablet, Take 1,000 mg by mouth every 6 (six) hours as needed (for pain.)., Disp: , Rfl:    apixaban  (ELIQUIS ) 5 MG TABS tablet, Take 1 tablet (5 mg total) by mouth 2 (two) times daily., Disp: 180 tablet, Rfl: 1   BIOTIN PO, Take by mouth., Disp: , Rfl:    Blood Glucose Monitoring Suppl DEVI, May substitute to any manufacturer covered by patient's insurance. Use glucometer as directed, Disp: 1 each, Rfl: 0   clobetasol  cream (TEMOVATE ) 0.05 %, Apply to labia twice daily for 3 weeks then twice weekly as directed, Disp: 60 g, Rfl: 2   Coenzyme Q10-Vitamin E (QUNOL ULTRA COQ10) 100-150 MG-UNIT CAPS, , Disp: , Rfl:    famotidine  (PEPCID ) 20 MG tablet, TAKE 1 TABLET BY MOUTH TWICE  DAILY AS NEEDED FOR HEARTBURN OR INDIGESTION, Disp: 200 tablet, Rfl: 2   lisinopril  (ZESTRIL ) 5 MG tablet, TAKE 1 TABLET BY MOUTH DAILY FOR BLOOD PRESSURE, Disp: 90 tablet, Rfl: 3   Melatonin 2.5 MG CHEW, Chew 2.5 mg by mouth at bedtime., Disp: , Rfl:    metoprolol  tartrate (LOPRESSOR ) 25 MG tablet, TAKE 1 TABLET BY  MOUTH DAILY AS  NEEDED FOR SUSTAINED HEART RATE  GREATER THAN 110, Disp: 100 tablet, Rfl: 0   Misc Natural Products (NEURIVA PO), Take by mouth., Disp: , Rfl:    Multiple Vitamins-Minerals (CENTRUM SILVER 50+WOMEN) TABS, Take 1 tablet by mouth daily., Disp: , Rfl:    simvastatin  (ZOCOR ) 40 MG tablet, TAKE 1 TABLET BY MOUTH AT  BEDTIME, Disp: 100 tablet, Rfl: 0  Current Facility-Administered Medications:    clobetasol  ointment (TEMOVATE ) 0.05 %, , Topical, BID, Janit, Alm Agent, MD   No Known Allergies   Social History   Tobacco Use   Smoking status: Former    Current packs/day: 0.00    Average  packs/day: 3.0 packs/day for 45.0 years (135.0 ttl pk-yrs)    Types: Cigarettes    Start date: 10/25/1964    Quit date: 10/25/2009    Years since quitting: 14.5   Smokeless tobacco: Never   Tobacco comments:    Quit before my husband was diagnosed with small cell cancer.  Vaping Use   Vaping status: Never Used  Substance Use Topics   Alcohol use: Not Currently    Comment: Rarely   Drug use: No      Chart Review Today: I personally reviewed active problem list, medication list, allergies, family history, social history, health maintenance, notes from last encounter, lab results, imaging with the patient/caregiver today.   Review of Systems  Constitutional: Negative.   HENT: Negative.    Eyes: Negative.   Respiratory: Negative.    Cardiovascular: Negative.   Gastrointestinal: Negative.   Endocrine: Negative.   Genitourinary: Negative.   Musculoskeletal: Negative.   Skin: Negative.   Allergic/Immunologic: Negative.   Neurological: Negative.   Hematological: Negative.   Psychiatric/Behavioral: Negative.    All other systems reviewed and are negative.      Objective:   Vitals:   05/01/24 1301  BP: 126/72  Pulse: 90  Resp: 16  SpO2: 96%  Weight: 176 lb (79.8 kg)  Height: 5' 8 (1.727 m)    Body mass index is 26.76 kg/m.  Physical Exam Vitals and nursing note reviewed.  Constitutional:      General: She is not in acute distress.    Appearance: Normal appearance. She is well-developed. She is not ill-appearing, toxic-appearing or diaphoretic.  HENT:     Head: Normocephalic and atraumatic.     Right Ear: External ear normal.     Left Ear: External ear normal.     Nose: Nose normal.  Eyes:     General: No scleral icterus.       Right eye: No discharge.        Left eye: No discharge.     Conjunctiva/sclera: Conjunctivae normal.  Neck:     Trachea: No tracheal deviation.  Cardiovascular:     Rate and Rhythm: Normal rate.  Pulmonary:     Effort: Pulmonary  effort is normal. No respiratory distress.     Breath sounds: No stridor.  Skin:    General: Skin is warm and dry.     Findings: Bruising (right AC and left wrist) present. No rash.  Neurological:     Mental Status: She is alert.     Motor: No abnormal muscle tone.     Coordination: Coordination normal.     Gait: Gait normal.  Psychiatric:        Mood and Affect: Mood normal.        Behavior: Behavior normal.      Results for orders  placed or performed during the hospital encounter of 04/27/24  Culture, blood (single) w Reflex to ID Panel   Collection Time: 04/27/24  8:59 AM   Specimen: BLOOD  Result Value Ref Range   Specimen Description BLOOD BLOOD RIGHT ARM    Special Requests      BOTTLES DRAWN AEROBIC AND ANAEROBIC Blood Culture results may not be optimal due to an inadequate volume of blood received in culture bottles   Culture      NO GROWTH 3 DAYS Performed at [facility], [address] [place], [place] [address]    Report Status PENDING   Lactic acid, plasma   Collection Time: 04/27/24  9:11 AM  Result Value Ref Range   Lactic Acid, Venous 1.7 0.5 - 1.9 mmol/L  Comprehensive metabolic panel   Collection Time: 04/27/24  9:11 AM  Result Value Ref Range   Sodium 142 135 - 145 mmol/L   Potassium 4.4 3.5 - 5.1 mmol/L   Chloride 108 98 - 111 mmol/L   CO2 25 22 - 32 mmol/L   Glucose, Bld 127 (H) 70 - 99 mg/dL   BUN 19 8 - 23 mg/dL   Creatinine, Ser 9.30 0.44 - 1.00 mg/dL   Calcium 9.6 8.9 - 89.6 mg/dL   Total Protein 6.5 6.5 - 8.1 g/dL   Albumin 3.8 3.5 - 5.0 g/dL   AST 33 15 - 41 U/L   ALT 18 0 - 44 U/L   Alkaline Phosphatase 71 38 - 126 U/L   Total Bilirubin 1.1 0.0 - 1.2 mg/dL   GFR, Estimated >39 >39 mL/min   Anion gap 9 5 - 15  CBC with Differential   Collection Time: 04/27/24  9:11 AM  Result Value Ref Range   WBC 6.3 4.0 - 10.5 K/uL   RBC 5.05 3.87 - 5.11 MIL/uL   Hemoglobin 15.5 (H) 12.0 - 15.0 g/dL   HCT 54.0 63.9 - 53.9 %   MCV 90.9  80.0 - 100.0 fL   MCH 30.7 26.0 - 34.0 pg   MCHC 33.8 30.0 - 36.0 g/dL   RDW 87.0 88.4 - 84.4 %   Platelets 185 150 - 400 K/uL   nRBC 0.0 0.0 - 0.2 %   Neutrophils Relative % 74 %   Neutro Abs 4.7 1.7 - 7.7 K/uL   Lymphocytes Relative 19 %   Lymphs Abs 1.2 0.7 - 4.0 K/uL   Monocytes Relative 5 %   Monocytes Absolute 0.3 0.1 - 1.0 K/uL   Eosinophils Relative 1 %   Eosinophils Absolute 0.1 0.0 - 0.5 K/uL   Basophils Relative 1 %   Basophils Absolute 0.1 0.0 - 0.1 K/uL   Immature Granulocytes 0 %   Abs Immature Granulocytes 0.01 0.00 - 0.07 K/uL  Protime-INR   Collection Time: 04/27/24  9:11 AM  Result Value Ref Range   Prothrombin Time 13.8 11.4 - 15.2 seconds   INR 1.0 0.8 - 1.2  Troponin I (High Sensitivity)   Collection Time: 04/27/24  9:11 AM  Result Value Ref Range   Troponin I (High Sensitivity) 3 <18 ng/L  Resp panel by RT-PCR (RSV, Flu A&B, Covid) Anterior Nasal Swab   Collection Time: 04/27/24  9:12 AM   Specimen: Anterior Nasal Swab  Result Value Ref Range   SARS Coronavirus 2 by RT PCR NEGATIVE NEGATIVE   Influenza A by PCR NEGATIVE NEGATIVE   Influenza B by PCR NEGATIVE NEGATIVE   Resp Syncytial Virus by PCR NEGATIVE NEGATIVE  Urinalysis, w/ Reflex to Culture (Infection Suspected) -Urine, Clean Catch   Collection Time: 04/27/24 10:15 AM  Result Value Ref Range   Specimen Source URINE, CLEAN CATCH    Color, Urine STRAW (A) YELLOW   APPearance CLEAR (A) CLEAR   Specific Gravity, Urine 1.008 1.005 - 1.030   pH 6.0 5.0 - 8.0   Glucose, UA NEGATIVE NEGATIVE mg/dL   Hgb urine dipstick MODERATE (A) NEGATIVE   Bilirubin Urine NEGATIVE NEGATIVE   Ketones, ur NEGATIVE NEGATIVE mg/dL   Protein, ur NEGATIVE NEGATIVE mg/dL   Nitrite NEGATIVE NEGATIVE   Leukocytes,Ua NEGATIVE NEGATIVE   RBC / HPF 6-10 0 - 5 RBC/hpf   WBC, UA 0-5 0 - 5 WBC/hpf   Bacteria, UA RARE (A) NONE SEEN   Squamous Epithelial / HPF 0 0 - 5 /HPF   Mucus PRESENT        Assessment & Plan:      ICD-10-CM   1. Bruising  T14.8XXA     Pt reassured that brusing from IV with new anticoagulant is not pathelogical, no other concerning signs or sx of bleeding, labs from ED reveiwed and reassuring      Michelene Cower, PA-C 05/01/24 1:10 PM

## 2024-05-04 ENCOUNTER — Other Ambulatory Visit: Payer: Self-pay | Admitting: Family Medicine

## 2024-05-04 ENCOUNTER — Telehealth: Payer: Self-pay | Admitting: Acute Care

## 2024-05-04 ENCOUNTER — Other Ambulatory Visit: Payer: Self-pay

## 2024-05-04 DIAGNOSIS — I700 Atherosclerosis of aorta: Secondary | ICD-10-CM

## 2024-05-04 DIAGNOSIS — Z122 Encounter for screening for malignant neoplasm of respiratory organs: Secondary | ICD-10-CM

## 2024-05-04 DIAGNOSIS — E782 Mixed hyperlipidemia: Secondary | ICD-10-CM

## 2024-05-04 DIAGNOSIS — Z87891 Personal history of nicotine dependence: Secondary | ICD-10-CM

## 2024-05-04 LAB — CULTURE, BLOOD (SINGLE): Culture: NO GROWTH

## 2024-05-04 NOTE — Telephone Encounter (Signed)
 Spoke with patient by phone, using two patient identifiers, to review results of recent LDCT.  No suspicious findings for lung cancer.  Plan to follow up next CT in one year.  Emphysema and atherosclerosis, as previously noted.  Know thyroid  nodule again noted with ultrasound follow up recommended for 2025 ( previous imaging ws 2023).  Patient states her PCP follows her for the thyroid  nodule and she has an upcoming appointment in December 2025.  Advised to discuss results at next visit and we have faxed the results to Michelene Cower, PA-C to review the ultrasound recommendation and advise the patient.  Patient had no questions and acknowledged she will discuss with PCP, as planned for thyroid  labwork at next visit.  Order placed for annual LDCT.

## 2024-05-06 ENCOUNTER — Encounter: Payer: Self-pay | Admitting: Family Medicine

## 2024-05-07 DIAGNOSIS — C44629 Squamous cell carcinoma of skin of left upper limb, including shoulder: Secondary | ICD-10-CM | POA: Diagnosis not present

## 2024-05-07 NOTE — Telephone Encounter (Signed)
 Requested Prescriptions  Pending Prescriptions Disp Refills   simvastatin  (ZOCOR ) 40 MG tablet [Pharmacy Med Name: Simvastatin  40 MG Oral Tablet] 100 tablet 2    Sig: TAKE 1 TABLET BY MOUTH AT  BEDTIME     Cardiovascular:  Antilipid - Statins Failed - 05/07/2024  3:27 PM      Failed - Lipid Panel in normal range within the last 12 months    Cholesterol, Total  Date Value Ref Range Status  11/27/2015 143 100 - 199 mg/dL Final   Cholesterol  Date Value Ref Range Status  09/29/2023 128 <200 mg/dL Final   LDL Cholesterol (Calc)  Date Value Ref Range Status  09/29/2023 51 mg/dL (calc) Final    Comment:    Reference range: <100 . Desirable range <100 mg/dL for primary prevention;   <70 mg/dL for patients with CHD or diabetic patients  with > or = 2 CHD risk factors. SABRA LDL-C is now calculated using the Martin-Hopkins  calculation, which is a validated novel method providing  better accuracy than the Friedewald equation in the  estimation of LDL-C.  Gladis APPLETHWAITE et al. SANDREA. 7986;689(80): 2061-2068  (http://education.[facility].com/faq/FAQ164)    HDL  Date Value Ref Range Status  09/29/2023 55 > OR = 50 mg/dL Final  97/97/7982 48 >60 mg/dL Final   Triglycerides  Date Value Ref Range Status  09/29/2023 132 <150 mg/dL Final         Passed - Patient is not pregnant      Passed - Valid encounter within last 12 months    Recent Outpatient Visits           6 days ago    [facility] Leavy Mole, PA-C   1 month ago Leg swelling   [facility] Bernardo Fend, DO       Future Appointments             In 2 months Riddle, Suzann, NP [facility] [place]   In 5 months Leavy Mole, PA-C [facility]

## 2024-05-11 ENCOUNTER — Encounter: Payer: Self-pay | Admitting: Family Medicine

## 2024-05-12 ENCOUNTER — Encounter: Payer: Self-pay | Admitting: Family Medicine

## 2024-05-14 ENCOUNTER — Encounter: Payer: Self-pay | Admitting: Family Medicine

## 2024-06-07 ENCOUNTER — Ambulatory Visit (INDEPENDENT_AMBULATORY_CARE_PROVIDER_SITE_OTHER): Payer: Medicare Other

## 2024-06-07 ENCOUNTER — Encounter: Payer: Self-pay | Admitting: Family Medicine

## 2024-06-07 DIAGNOSIS — Z0000 Encounter for general adult medical examination without abnormal findings: Secondary | ICD-10-CM

## 2024-06-07 DIAGNOSIS — Z1231 Encounter for screening mammogram for malignant neoplasm of breast: Secondary | ICD-10-CM | POA: Diagnosis not present

## 2024-06-07 NOTE — Patient Instructions (Addendum)
 Felicia Cisneros , Thank you for taking time out of your busy schedule to complete your Annual Wellness Visit with me. I enjoyed our conversation and look forward to speaking with you again next year. I, as well as your care team,  appreciate your ongoing commitment to your health goals. Please review the following plan we discussed and let me know if I can assist you in the future.  REFERRAL SENT FOR MAMMOGRAM FOR DECEMBER  You have an order for:  []   2D Mammogram  [x]   3D Mammogram  []   Bone Density     Please call for appointment:  [facility]  [address] [place] [address] [phone] [facility] [address] [place], [place] [address] [phone] [facility] [address] [place], [place] [address] [phone]   Make sure to wear two-piece clothing.  No lotions, powders, or deodorants the day of the appointment. Make sure to bring picture ID and insurance card.  Bring list of medications you are currently taking including any supplements.   Schedule your [place] screening mammogram through MyChart!   Log into your MyChart account.  Go to 'Visit' (or 'Appointments' if on mobile App) --> Schedule an Appointment  Under 'Select a Reason for Visit' choose the Mammogram Screening option.  Complete the pre-visit questions and select the time and place that best fits your schedule.   Follow up Visits: 06/13/25 @ 3:20 PM BY PHONE We will see or speak with you next year for your Next Medicare AWV with our clinical staff Have you seen your provider in the last 6 months (3 months if uncontrolled diabetes)? Yes  Clinician Recommendations:  Aim for 30 minutes of exercise or brisk walking, 6-8 glasses of water , and 5 servings of fruits and vegetables each day. TAKE CARE!      This is a list of the screenings recommended for you:  Health  Maintenance  Topic Date Due   Yearly kidney health urinalysis for diabetes  12/16/2023   COVID-19 Vaccine (10 - 2024-25 season) 01/10/2024   Flu Shot  05/25/2024   Complete foot exam   09/28/2024   Hemoglobin A1C  10/03/2024   Mammogram  10/09/2024   Eye exam for diabetics  12/04/2024   Screening for Lung Cancer  04/12/2025   Yearly kidney function blood test for diabetes  04/27/2025   Medicare Annual Wellness Visit  06/07/2025   DEXA scan (bone density measurement)  11/13/2025   Colon Cancer Screening  02/21/2028   DTaP/Tdap/Td vaccine (4 - Td or Tdap) 07/10/2029   Pneumococcal Vaccine for age over 60  Completed   Hepatitis C Screening  Completed   Zoster (Shingles) Vaccine  Completed   HPV Vaccine  Aged Out   Meningitis B Vaccine  Aged Out   Pneumococcal Vaccine  Discontinued    Advanced directives: (ACP Link)Information on Advanced Care Planning can be found at Electrical [employer] Advance Health Care Directives Advance Health Care Directives. [email or web address]  Advance Care Planning is important because it:  [x]  Makes sure you receive the medical care that is consistent with your values, goals, and preferences  [x]  It provides guidance to your family and loved ones and reduces their decisional burden about whether or not they are making the right decisions based on your wishes.  Follow the link provided in your after visit summary or read over  the paperwork we have mailed to you to help you started getting your Advance Directives in place. If you need assistance in completing these, please reach out to us  so that we can help you!

## 2024-06-07 NOTE — Progress Notes (Signed)
 Subjective:   Felicia Cisneros is a 73 y.o. who presents for a Medicare Wellness preventive visit.  As a reminder, Annual Wellness Visits don't include a physical exam, and some assessments may be limited, especially if this visit is performed virtually. We may recommend an in-person follow-up visit with your provider if needed.  Visit Complete: Virtual I connected with  Felicia Cisneros on 06/07/24 by a audio enabled telemedicine application and verified that I am speaking with the correct person using two identifiers.  Patient Location: Home  Provider Location: Home Office  I discussed the limitations of evaluation and management by telemedicine. The patient expressed understanding and agreed to proceed.  Vital Signs: Because this visit was a virtual/telehealth visit, some criteria may be missing or patient reported. Any vitals not documented were not able to be obtained and vitals that have been documented are patient reported.  VideoDeclined- This patient declined Librarian, academic. Therefore the visit was completed with audio only.  Persons Participating in Visit: Patient.  AWV Questionnaire: No: Patient Medicare AWV questionnaire was not completed prior to this visit.  Cardiac Risk Factors include: advanced age (>63men, >84 women);diabetes mellitus;dyslipidemia;hypertension     Objective:    There were no vitals filed for this visit. There is no height or weight on file to calculate BMI.     06/07/2024    3:33 PM 04/27/2024    9:06 AM 12/03/2023    7:28 AM 09/09/2022    4:03 PM 09/08/2021    9:43 AM 07/08/2020    9:10 AM 05/14/2020    3:46 PM  Advanced Directives  Does Patient Have a Medical Advance Directive? No Yes Yes Yes Yes Yes Yes  Type of Advance Directive  Living will Healthcare Power of [place];Living will Healthcare Power of [employer] of [place];Living will Healthcare Power of [place];Living will Healthcare Power of  [place];Living will  Does patient want to make changes to medical advance directive?  No - Patient declined  No - Guardian declined     Copy of Healthcare Power of Attorney in Chart?   No - copy requested No - copy requested No - copy requested No - copy requested   Would patient like information on creating a medical advance directive? No - Patient declined          Current Medications (verified) Outpatient Encounter Medications as of 06/07/2024  Medication Sig   acetaminophen  (TYLENOL ) 500 MG tablet Take 1,000 mg by mouth every 6 (six) hours as needed (for pain.).   apixaban  (ELIQUIS ) 5 MG TABS tablet Take 1 tablet (5 mg total) by mouth 2 (two) times daily.   BIOTIN PO Take by mouth.   Blood Glucose Monitoring Suppl DEVI May substitute to any manufacturer covered by patient's insurance. Use glucometer as directed   clobetasol  cream (TEMOVATE ) 0.05 % Apply to labia twice daily for 3 weeks then twice weekly as directed   Coenzyme Q10-Vitamin E (QUNOL ULTRA COQ10) 100-150 MG-UNIT CAPS    famotidine  (PEPCID ) 20 MG tablet TAKE 1 TABLET BY MOUTH TWICE  DAILY AS NEEDED FOR HEARTBURN OR INDIGESTION   lisinopril  (ZESTRIL ) 5 MG tablet TAKE 1 TABLET BY MOUTH DAILY FOR BLOOD PRESSURE   Melatonin 2.5 MG CHEW Chew 2.5 mg by mouth at bedtime. (Patient taking differently: Chew 2.5 mg by mouth at bedtime. TAKES PRN)   Misc Natural Products (NEURIVA PO) Take by mouth.   Multiple Vitamins-Minerals (CENTRUM SILVER 50+WOMEN) TABS Take 1 tablet by mouth daily.  simvastatin (ZOCOR) 40 MG tablet TAKE 1 TABLET BY MOUTH AT  BEDTIME   metoprolol tartrate (LOPRESSOR) 25 MG tablet TAKE 1 TABLET BY MOUTH DAILY AS  NEEDED FOR SUSTAINED HEART RATE  GREATER THAN 110 (Patient not taking: Reported on 06/07/2024)   Facility-Administered Encounter Medications as of 06/07/2024  Medication   clobetasol ointment (TEMOVATE) 0.05 %    Allergies (verified) Patient has no known allergies.   History: Past Medical History:   Diagnosis Date   Aortic atherosclerosis (HCC)    a. 11/2017 noted on Chest CT - Aortic and branch vessel atherosclerosis.   Atrophic vaginitis    Centrilobular emphysema (HCC) 12/02/2016   Chest pain    a. 06/2013 ETT: Ex time 6:00, Max HR 136 (87%), 7.1 METS. No ECG changes; b. 07/2018 MV: Ex time: 6:42, nl EF, No ischemia.   COPD (chronic obstructive pulmonary disease) (HCC)    a. 11/2017 Chest CT: moderate centrilobular emphysema.   Diabetes mellitus without complication (HCC)    Diverticulitis    DNAR (do not attempt resuscitation) 06/09/2017   Discussed today, 06/09/2017   Essential hypertension    GERD (gastroesophageal reflux disease)    History of kidney stones    History of tobacco use    Hyperlipidemia    IFG (impaired fasting glucose)    Irregular heart beat 2018   Leukorrhea, not specified as infective    Osteopenia 05/27/2016   August 2017   Personal history of tobacco use, presenting hazards to health 08/20/2015   Pulmonary nodules    a. 11/2017 CT Chest: No change in bilat pulm nodules, including an ant LUL nodule - 5.58mm.   Thyroid nodule 12/09/2022   Past Surgical History:  Procedure Laterality Date   colonoscopy     COLONOSCOPY WITH PROPOFOL N/A 02/20/2018   Procedure: COLONOSCOPY WITH PROPOFOL;  Surgeon: Jinny Carmine, MD;  Location: [facility];  Service: Endoscopy;  Laterality: N/A;  Diabetic   CYSTOSCOPY WITH BIOPSY N/A 01/17/2017   Procedure: CYSTOSCOPY WITH BIOPSY;  Surgeon: Rosina Riis, MD;  Location: [facility];  Service: Urology;  Laterality: N/A;   POLYPECTOMY  02/20/2018   Procedure: POLYPECTOMY;  Surgeon: Jinny Carmine, MD;  Location: [facility];  Service: Endoscopy;;   TMJ ARTHROPLASTY  1984   Family History  Problem Relation Age of Onset   Stroke Mother    Hypertension Mother    Hyperlipidemia Mother    Diabetes Mother        pre-diabetic   Hearing loss Mother    Diabetes Brother    Irregular heart beat Brother    COPD  Father    Hearing loss Father    Thyroid disease Sister    Stroke Maternal Grandmother    Hyperlipidemia Maternal Grandmother    Hypertension Maternal Grandmother    Cancer Maternal Grandfather        bone   Cancer Sister        cystic fibroid carcinoma   Ulcerative colitis Sister    Heart disease Paternal Uncle    Heart disease Paternal Uncle    Hyperlipidemia Maternal Aunt    Stroke Maternal Aunt    Breast cancer Neg Hx    Hematuria Neg Hx    Prostate cancer Neg Hx    Renal cancer Neg Hx    Social History   Socioeconomic History   Marital status: Widowed    Spouse name: Vallarie   Number of children: 1   Years of education: some college   Highest  education level: 12th grade  Occupational History    Employer: [employer]  Tobacco Use   Smoking status: Former    Current packs/day: 0.00    Average packs/day: 3.0 packs/day for 45.0 years (135.0 ttl pk-yrs)    Types: Cigarettes    Start date: 10/25/1964    Quit date: 10/25/2009    Years since quitting: 14.6   Smokeless tobacco: Never   Tobacco comments:    Quit before my husband was diagnosed with small cell cancer.  Vaping Use   Vaping status: Never Used  Substance and Sexual Activity   Alcohol use: Not Currently    Comment: Rarely   Drug use: No   Sexual activity: Not Currently    Birth control/protection: None  Other Topics Concern   Not on file  Social History Narrative   Not on file   Social Drivers of Health   Financial Resource Strain: Low Risk  (06/07/2024)   Overall Financial Resource Strain (CARDIA)    Difficulty of Paying Living Expenses: Not very hard  Food Insecurity: No Food Insecurity (06/07/2024)   Hunger Vital Sign    Worried About Running Out of Food in the Last Year: Never true    Ran Out of Food in the Last Year: Never true  Transportation Needs: No Transportation Needs (06/07/2024)   PRAPARE - Administrator, Civil Service (Medical): No    Lack of  Transportation (Non-Medical): No  Physical Activity: Insufficiently Active (06/07/2024)   Exercise Vital Sign    Days of Exercise per Week: 2 days    Minutes of Exercise per Session: 60 min  Stress: No Stress Concern Present (06/07/2024)   [employer] of Occupational Health - Occupational Stress Questionnaire    Feeling of Stress: Only a little  Recent Concern: Stress - Stress Concern Present (04/02/2024)   [employer] of Occupational Health - Occupational Stress Questionnaire    Feeling of Stress : To some extent  Social Connections: Moderately Integrated (06/07/2024)   Social Connection and Isolation Panel    Frequency of Communication with Friends and Family: Twice a week    Frequency of Social Gatherings with Friends and Family: Twice a week    Attends Religious Services: 1 to 4 times per year    Active Member of [employer] or Organizations: Yes    Attends Banker Meetings: More than 4 times per year    Marital Status: Widowed    Tobacco Counseling Counseling given: Not Answered Tobacco comments: Quit before my husband was diagnosed with small cell cancer.    Clinical Intake:  Pre-visit preparation completed: Yes  Pain : No/denies pain     BMI - recorded: 26.8 Nutritional Status: BMI 25 -29 Overweight Nutritional Risks: None Diabetes: No  Lab Results  Component Value Date   HGBA1C 6.1 (H) 04/03/2024   HGBA1C 6.2 (H) 09/29/2023   HGBA1C 6.3 (H) 03/28/2023     How often do you need to have someone help you when you read instructions, pamphlets, or other written materials from your doctor or pharmacy?: 1 - Never  Interpreter Needed?: No  Information entered by :: JHONNIE DAS, LPN   Activities of Daily Living    06/07/2024    3:38 PM 06/03/2024    9:28 AM  In your present state of health, do you have any difficulty performing the following activities:  Hearing? 0 0  Vision? 0 0  Difficulty concentrating or making decisions? 1 0  Comment  REMEMBERING  Walking or climbing stairs? 0 0  Dressing or bathing? 0 0  Doing errands, shopping? 0   Preparing Food and eating ? N N  Using the Toilet? N N  In the past six months, have you accidently leaked urine? N N  Do you have problems with loss of bowel control? N N  Managing your Medications? N N  Managing your Finances? N N  Housekeeping or managing your Housekeeping? N N    Patient Care Team: Leavy Mole, PA-C as PCP - General (Family Medicine) Perla Evalene PARAS, MD as PCP - Cardiology (Cardiology) Kennyth Chew, MD as PCP - Electrophysiology (Cardiology) Isenstein, Arin L, MD as Consulting Physician (Dermatology) Perla Evalene PARAS, MD as Consulting Physician (Cardiology) Jinny Carmine, MD as Consulting Physician (Gastroenterology) Lucio Franky PARAS, OD (Optometry)  I have updated your Care Teams any recent Medical Services you may have received from other providers in the past year.     Assessment:   This is a routine wellness examination for [place].  Hearing/Vision screen Hearing Screening - Comments:: NO AIDS Vision Screening - Comments:: WEARS GLASSES ALL DAY, HAD CATARACT SGY-LENSCRAFTERS DR.RITTER   Goals Addressed             This Visit's Progress    DIET - EAT MORE FRUITS AND VEGETABLES         Depression Screen     06/07/2024    3:31 PM 09/29/2023    8:34 AM 08/01/2023   11:30 AM 06/29/2023    2:27 PM 03/28/2023    9:32 AM 12/27/2022    8:26 AM 12/09/2022    8:45 AM  PHQ 2/9 Scores  PHQ - 2 Score 0 1 0 0 0 0 6  PHQ- 9 Score 0 4  0 0 0 15    Fall Risk     06/07/2024    3:38 PM 06/03/2024    9:28 AM 09/29/2023    8:33 AM 08/01/2023   11:30 AM 06/29/2023    2:27 PM  Fall Risk   Falls in the past year? 1 1 1  0 0  Number falls in past yr: 0 0 0 0 0  Injury with Fall? 0 0 0 0 0  Risk for fall due to : No Fall Risks  No Fall Risks No Fall Risks No Fall Risks  Follow up Falls evaluation completed;Falls prevention discussed  Falls prevention discussed  Falls prevention discussed Falls prevention discussed;Education provided;Falls evaluation completed    MEDICARE RISK AT HOME:  Medicare Risk at Home Any stairs in or around the home?: Yes If so, are there any without handrails?: No Home free of loose throw rugs in walkways, pet beds, electrical cords, etc?: No Adequate lighting in your home to reduce risk of falls?: Yes Life alert?: Yes Use of a cane, walker or w/c?: No Grab bars in the bathroom?: Yes Shower chair or bench in shower?: No Elevated toilet seat or a handicapped toilet?: No  TIMED UP AND GO:  Was the test performed?  No  Cognitive Function: 6CIT completed        06/07/2024    3:41 PM 09/29/2023    9:28 AM 09/09/2022    2:16 PM 07/08/2020    9:13 AM 07/03/2019    9:07 AM  6CIT Screen  What Year? 0 points 0 points 0 points 0 points 0 points  What month? 0 points 0 points 0 points 0 points 0 points  What time? 0 points 0 points 0 points 0 points  0 points  Count back from 20 0 points 0 points 0 points 0 points 0 points  Months in reverse 0 points 0 points 0 points 0 points 0 points  Repeat phrase 0 points 0 points 0 points 0 points 4 points  Total Score 0 points 0 points 0 points 0 points 4 points    Immunizations Immunization History  Administered Date(s) Administered    sv, Bivalent, Protein Subunit Rsvpref,pf (Abrysvo) 09/22/2022   Fluad Quad(high Dose 65+) 07/08/2020, 07/21/2022   Influenza, High Dose Seasonal PF 06/12/2017, 06/29/2018, 06/13/2019, 07/13/2023   Influenza,inj,Quad PF,6+ Mos 08/22/2015   Influenza-Unspecified 06/15/2017, 07/08/2021, 07/21/2022   PFIZER Comirnaty(Gray Top)Covid-19 Tri-Sucrose Vaccine 03/18/2021, 07/21/2022   PFIZER(Purple Top)SARS-COV-2 Vaccination 12/16/2019, 01/08/2020, 08/08/2020   PNEUMOCOCCAL CONJUGATE-20 02/18/2021   Pfizer Covid-19 Vaccine Bivalent Booster 97yrs & up 07/08/2021, 12/23/2021, 01/05/2023   Pfizer(Comirnaty)Fall Seasonal Vaccine 12 years and older  07/21/2022, 01/05/2023, 07/13/2023   Pneumococcal Conjugate-13 06/09/2017   Pneumococcal Polysaccharide-23 11/27/2015   Respiratory Syncytial Virus Vaccine,Recomb Aduvanted(Arexvy) 09/22/2022   Td 07/11/2019   Tdap 10/25/2008, 07/11/2019   Zoster Recombinant(Shingrix) 07/11/2019, 12/05/2019   Zoster, Live 02/27/2016    Screening Tests Health Maintenance  Topic Date Due   Diabetic kidney evaluation - Urine ACR  12/16/2023   COVID-19 Vaccine (10 - 2024-25 season) 01/10/2024   INFLUENZA VACCINE  05/25/2024   FOOT EXAM  09/28/2024   HEMOGLOBIN A1C  10/03/2024   MAMMOGRAM  10/09/2024   OPHTHALMOLOGY EXAM  12/04/2024   Lung Cancer Screening  04/12/2025   Diabetic kidney evaluation - eGFR measurement  04/27/2025   Medicare Annual Wellness (AWV)  06/07/2025   DEXA SCAN  11/13/2025   Colonoscopy  02/21/2028   DTaP/Tdap/Td (4 - Td or Tdap) 07/10/2029   Pneumococcal Vaccine: 50+ Years  Completed   Hepatitis C Screening  Completed   Zoster Vaccines- Shingrix  Completed   HPV VACCINES  Aged Out   Meningococcal B Vaccine  Aged Out   Pneumococcal Vaccine  Discontinued    Health Maintenance  Health Maintenance Due  Topic Date Due   Diabetic kidney evaluation - Urine ACR  12/16/2023   COVID-19 Vaccine (10 - 2024-25 season) 01/10/2024   INFLUENZA VACCINE  05/25/2024   Health Maintenance Items Addressed: UP TO DATE ON MAMMOGRAM (DUE IN DECEMBER- REFERRAL SENT), BDS & COLONOSCOPY- UP TO DATE ON SHOTS  Additional Screening:  Vision Screening: Recommended annual ophthalmology exams for early detection of glaucoma and other disorders of the eye. Would you like a referral to an eye doctor? No    Dental Screening: Recommended annual dental exams for proper oral hygiene  Community Resource Referral / Chronic Care Management: CRR required this visit?  No   CCM required this visit?  No   Plan:    I have personally reviewed and noted the following in the patient's chart:   Medical  and social history Use of alcohol, tobacco or illicit drugs  Current medications and supplements including opioid prescriptions. Patient is not currently taking opioid prescriptions. Functional ability and status Nutritional status Physical activity Advanced directives List of other physicians Hospitalizations, surgeries, and ER visits in previous 12 months Vitals Screenings to include cognitive, depression, and falls Referrals and appointments  In addition, I have reviewed and discussed with patient certain preventive protocols, quality metrics, and best practice recommendations. A written personalized care plan for preventive services as well as general preventive health recommendations were provided to patient.   Jhonnie GORMAN Das, LPN   1/85/7974   After  Visit Summary: (MyChart) Due to this being a telephonic visit, the after visit summary with patients personalized plan was offered to patient via MyChart   Notes: MAMMOGRAM ORDERED FOR DECEMBER  PT IN LUNG CANCER STUDY (10 YEARS)

## 2024-06-10 ENCOUNTER — Other Ambulatory Visit: Payer: Self-pay | Admitting: Family Medicine

## 2024-06-12 NOTE — Telephone Encounter (Signed)
 Requested Prescriptions  Pending Prescriptions Disp Refills   famotidine  (PEPCID ) 20 MG tablet [Pharmacy Med Name: Famotidine  20 MG Oral Tablet] 200 tablet 0    Sig: TAKE 1 TABLET BY MOUTH TWICE  DAILY AS NEEDED FOR HEARTBURN OR INDIGESTION     Gastroenterology:  H2 Antagonists Passed - 06/12/2024  5:32 PM      Passed - Valid encounter within last 12 months    Recent Outpatient Visits           1 month ago Bruising   [facility] Leavy Mole, PA-C   2 months ago Leg swelling   [facility] Bernardo Fend, DO       Future Appointments             In 1 month Riddle, Suzann, NP [facility] [place]   In 3 months Leavy Mole, PA-C [facility]

## 2024-06-14 ENCOUNTER — Other Ambulatory Visit: Payer: Self-pay | Admitting: Cardiology

## 2024-06-22 ENCOUNTER — Encounter: Payer: Self-pay | Admitting: Family Medicine

## 2024-06-30 ENCOUNTER — Encounter: Payer: Self-pay | Admitting: Family Medicine

## 2024-07-26 NOTE — Progress Notes (Unsigned)
 Electrophysiology Clinic Note    Date:  07/27/2024  Patient ID:  Felicia Cisneros, Felicia Cisneros 1951-06-04, MRN [id number] PCP:  Leavy Mole, PA-C  Cardiologist:  Evalene Lunger, MD  Electrophysiologist:  Fonda Kitty, MD    Discussed the use of AI scribe software for clinical note transcription with the patient, who gave verbal consent to proceed.   Patient Profile    Chief Complaint: AF follow-up  History of Present Illness: Felicia Cisneros is a 73 y.o. female with PMH notable for paroxysmal atrial fibrillation, hypertension, hyperlipidemia, GERD, prediabetes, COPD, history of tobacco abuse ; seen today for Fonda Kitty, MD for routine electrophysiology followup.   She last saw Dr. Kitty 01/2024 for initial EP evaluation of AFib, diagnosed on zio monitor. Asymptomatic tolerating eliquis  well. Using apple watch to monitor arrhythmia.   On follow-up today, she is overall doing very well. She wears apple watch during the day for afib monitoring, has not had any episodes. She does not wear it at night. She woke up about a month ago feeling like her heart was beating hard in her chest. She checked pulse on pulse ox and noted it was low 90s. She does not remember if the pulse rate was labile or stable in low 90s.  She continues to take eliquis  BID, no bleeding concerns.   She has lower extremity edema that is stable, wears compressions socks that are helpful.   she denies chest pain, palpitations, dyspnea, PND, orthopnea, nausea, vomiting, dizziness, syncope, edema, weight gain, or early satiety.      Arrhythmia/Device History No specialty comments available.    ROS:  Please see the history of present illness. All other systems are reviewed and otherwise negative.    Physical Exam    VS:  BP 118/78   Pulse 78   Ht 5' 8 (1.727 m)   Wt 181 lb 3.2 oz (82.2 kg)   SpO2 97%   BMI 27.55 kg/m  BMI: Body mass index is 27.55 kg/m.           Wt Readings from Last 3 Encounters:   07/27/24 181 lb 3.2 oz (82.2 kg)  05/01/24 176 lb (79.8 kg)  04/27/24 176 lb 9.6 oz (80.1 kg)     GEN- The patient is well appearing, alert and oriented x 3 today.   Lungs- Clear to ausculation bilaterally, normal work of breathing.  Heart- Regular rate and rhythm, no murmurs, rubs or gallops Extremities- Trace peripheral edema, warm, dry    Studies Reviewed   Previous EP, cardiology notes.    EKG is ordered. Personal review of EKG from today shows:    EKG Interpretation Date/Time:  Friday July 27 2024 09:40:20 EDT Ventricular Rate:  78 PR Interval:  182 QRS Duration:  74 QT Interval:  396 QTC Calculation: 451 R Axis:   -15  Text Interpretation: Normal sinus rhythm Possible Left atrial enlargement Confirmed by Emmi Wertheim [phone]) on 07/27/2024 9:44:18 AM    TTE, 02/20/2024  1. Left ventricular ejection fraction, by estimation, is 55 to 60%. The left ventricle has normal function. The left ventricle has no regional wall motion abnormalities. There is mild left ventricular hypertrophy. Left ventricular diastolic parameters are consistent with Grade I diastolic dysfunction (impaired relaxation). The average left ventricular global longitudinal strain is -17.3 %. The global longitudinal strain is normal.   2. Right ventricular systolic function is normal. The right ventricular size is normal.   3. The mitral valve is normal in structure.  No evidence of mitral valve regurgitation.   4. The aortic valve is tricuspid. Aortic valve regurgitation is not visualized.   5. The inferior vena cava is normal in size with greater than 50% respiratory variability, suggesting right atrial pressure of 3 mmHg.   Long term monitor, 01/03/2024 Patch Wear Time:  13 days and 0 hours (2025-02-12T21:50:03-499 to 2025-02-25T22:03:10-498)   Normal sinus rhythm with paroxysmal atrial fibrillation Patient had a min HR of 41 bpm, max HR of 198 bpm, and avg HR of 73 bpm.    5 Supraventricular  Tachycardia runs occurred, the run with the fastest interval lasting 4 beats with a max rate of 169 bpm, the longest lasting 9 beats with an avg rate of 150 bpm.    Atrial Fibrillation occurred (2% burden), ranging from 71-198 bpm (avg of 122 bpm), the longest lasting 2 hours 5 mins with an avg rate of 105 bpm.    Isolated SVEs were rare (<1.0%), SVE Couplets were rare (<1.0%), and SVE Triplets were rare (<1.0%).  Isolated VEs were rare (<1.0%), VE Couplets were rare (<1.0%), and no VE Triplets were present.  Ventricular Bigeminy and Trigeminy were present.    No patient triggered events recorded   Assessment and Plan     #) parox AFib Very low burden Using apple watch to monitor arrhythmia during the day We discussed using pulse ox and keeping the monitor on finger for a few seconds to watch pulse rate to ensure stability to determine whether afib vs sinus rhythm Continue lopressor  PRN for sustained afib episodes  #) Hypercoag d/t afib CHA2DS2-VASc Score = at least 5 [CHF History: 0, HTN History: 1, Diabetes History: 1, Stroke History: 0, Vascular Disease History: 1, Age Score: 1, Gender Score: 1].  Therefore, the patient's annual risk of stroke is 7.2 %.    Stroke ppx - 5mg  eliquis  BID, appropriately dosed No bleeding concerns        Current medicines are reviewed at length with the patient today.   The patient does not have concerns regarding her medicines.  The following changes were made today:  none  Labs/ tests ordered today include:  Orders Placed This Encounter  Procedures   EKG 12-Lead     Disposition:   Follow up with EP APP  in 12 months, sooner if needed.  We discussed following up PRN with EP team while monitoring rhythm. She prefers to continue to see EP   Signed, Chantal Needle, NP  07/27/24  10:48 AM  Electrophysiology [facility]

## 2024-07-27 ENCOUNTER — Ambulatory Visit: Attending: Cardiology | Admitting: Cardiology

## 2024-07-27 ENCOUNTER — Encounter: Payer: Self-pay | Admitting: Cardiology

## 2024-07-27 VITALS — BP 118/78 | HR 78 | Ht 68.0 in | Wt 181.2 lb

## 2024-07-27 DIAGNOSIS — D6869 Other thrombophilia: Secondary | ICD-10-CM

## 2024-07-27 DIAGNOSIS — I480 Paroxysmal atrial fibrillation: Secondary | ICD-10-CM | POA: Diagnosis not present

## 2024-07-27 NOTE — Patient Instructions (Signed)
 Medication Instructions:  Your physician recommends that you continue on your current medications as directed. Please refer to the Current Medication list given to you today.  *If you need a refill on your cardiac medications before your next appointment, please call your pharmacy*  Lab Work: No labs ordered today  If you have labs (blood work) drawn today and your tests are completely normal, you will receive your results only by: MyChart Message (if you have MyChart) OR A paper copy in the mail If you have any lab test that is abnormal or we need to change your treatment, we will call you to review the results.  Testing/Procedures: No test ordered today   Follow-Up: At [facility], you and your health needs are our priority.  As part of our continuing mission to provide you with exceptional heart care, our providers are all part of one team.  This team includes your primary Cardiologist (physician) and Advanced Practice Providers or APPs (Physician Assistants and Nurse Practitioners) who all work together to provide you with the care you need, when you need it.  Your next appointment:   12 month(s)  Provider:  Suzann Riddle, NP  We recommend signing up for the patient portal called MyChart.  Sign up information is provided on this After Visit Summary.  MyChart is used to connect with patients for Virtual Visits (Telemedicine).  Patients are able to view lab/test results, encounter notes, upcoming appointments, etc.  Non-urgent messages can be sent to your provider as well.   To learn more about what you can do with MyChart, go to [email or web address].

## 2024-08-08 ENCOUNTER — Telehealth: Payer: Self-pay | Admitting: Family Medicine

## 2024-08-08 NOTE — Progress Notes (Signed)
 BEYA TIPPS                                          MRN: [id number]   08/08/2024   The VBCI Quality Team Specialist reviewed this patient medical record for the purposes of chart review for care gap closure. The following were reviewed: chart review for care gap closure-kidney health evaluation for diabetes:eGFR  and uACR.    VBCI Quality Team

## 2024-08-08 NOTE — Telephone Encounter (Signed)
 Medication: diabetic testing and supplies (Contour or Accuchek)    Has the patient contacted their pharmacy? Yes (Agent: If no, request that the patient contact the pharmacy for the refill. If patient does not wish to contact the pharmacy document the reason why and proceed with request.) (Agent: If yes, when and what did the pharmacy advise?)   This is the patient's preferred pharmacy:  [facility] - [place], [place] - [address] [place] [address] Phone: [phone] Fax: [phone]

## 2024-08-08 NOTE — Telephone Encounter (Signed)
 Copied from CRM [id number]. Topic: Clinical - Medication Refill >> Aug 08, 2024  3:58 PM [name] wrote: Medication: diabetic testing and supplies (Contour or Accuchek)   Has the patient contacted their pharmacy? Yes (Agent: If no, request that the patient contact the pharmacy for the refill. If patient does not wish to contact the pharmacy document the reason why and proceed with request.) (Agent: If yes, when and what did the pharmacy advise?)  This is the patient's preferred pharmacy:  [facility] - [place], [place] - [address] [place] [address] Phone: [phone] Fax: [phone]  Is this the correct pharmacy for this prescription? Yes If no, delete pharmacy and type the correct one.   Has the prescription been filled recently? Yes  Is the patient out of the medication? Yes  Has the patient been seen for an appointment in the last year OR does the patient have an upcoming appointment? Yes  Can we respond through MyChart? No  Agent: Please be advised that Rx refills may take up to 3 business days. We ask that you follow-up with your pharmacy.

## 2024-08-09 NOTE — Telephone Encounter (Signed)
 Pt called and declined

## 2024-08-23 ENCOUNTER — Encounter: Payer: Self-pay | Admitting: Family Medicine

## 2024-08-27 NOTE — Progress Notes (Signed)
 NATASA STIGALL                                          MRN: [id number]   08/27/2024   The VBCI Quality Team Specialist reviewed this patient medical record for the purposes of chart review for care gap closure. The following were reviewed: chart review for care gap closure-kidney health evaluation for diabetes:eGFR  and uACR.    VBCI Quality Team

## 2024-09-04 NOTE — Progress Notes (Signed)
 Felicia Cisneros                                          MRN: [id number]   09/04/2024   The VBCI Quality Team Specialist reviewed this patient medical record for the purposes of chart review for care gap closure. The following were reviewed: erroneous encounter.    VBCI Quality Team

## 2024-09-13 ENCOUNTER — Encounter: Payer: Self-pay | Admitting: Internal Medicine

## 2024-09-17 ENCOUNTER — Telehealth: Payer: Self-pay | Admitting: Family Medicine

## 2024-09-17 ENCOUNTER — Other Ambulatory Visit: Payer: Self-pay

## 2024-09-17 MED ORDER — FAMOTIDINE 20 MG PO TABS: 20.0000 mg | ORAL_TABLET | Freq: Two times a day (BID) | ORAL | 0 refills | Status: DC

## 2024-09-17 NOTE — Telephone Encounter (Signed)
 famotidine  (PEPCID ) 20 MG tablet

## 2024-09-17 NOTE — Telephone Encounter (Signed)
 Refill sent.

## 2024-09-23 ENCOUNTER — Encounter: Payer: Self-pay | Admitting: Nurse Practitioner

## 2024-09-29 ENCOUNTER — Encounter: Payer: Self-pay | Admitting: Internal Medicine

## 2024-10-01 ENCOUNTER — Telehealth: Payer: Self-pay

## 2024-10-01 DIAGNOSIS — I10 Essential (primary) hypertension: Secondary | ICD-10-CM

## 2024-10-01 MED ORDER — LISINOPRIL 5 MG PO TABS: 5.0000 mg | ORAL_TABLET | Freq: Every day | ORAL | 0 refills | Status: DC

## 2024-10-01 NOTE — Telephone Encounter (Signed)
 refills

## 2024-10-02 MED ORDER — FAMOTIDINE 20 MG PO TABS: 20.0000 mg | ORAL_TABLET | Freq: Two times a day (BID) | ORAL | 0 refills | Status: AC

## 2024-10-03 ENCOUNTER — Encounter: Payer: Self-pay | Admitting: Emergency Medicine

## 2024-10-03 ENCOUNTER — Telehealth: Payer: Self-pay | Admitting: Emergency Medicine

## 2024-10-03 ENCOUNTER — Ambulatory Visit: Attending: Student | Admitting: Physician Assistant

## 2024-10-03 ENCOUNTER — Encounter: Payer: Self-pay | Admitting: Physician Assistant

## 2024-10-03 ENCOUNTER — Emergency Department
Admission: EM | Admit: 2024-10-03 | Discharge: 2024-10-03 | Disposition: A | Discharge status: Home / Self Care | Attending: Emergency Medicine | Admitting: Emergency Medicine

## 2024-10-03 ENCOUNTER — Other Ambulatory Visit: Payer: Self-pay

## 2024-10-03 VITALS — BP 135/58 | HR 47 | Ht 68.0 in | Wt 174.0 lb

## 2024-10-03 DIAGNOSIS — I4891 Unspecified atrial fibrillation: Secondary | ICD-10-CM | POA: Diagnosis not present

## 2024-10-03 DIAGNOSIS — E785 Hyperlipidemia, unspecified: Secondary | ICD-10-CM | POA: Diagnosis not present

## 2024-10-03 DIAGNOSIS — I10 Essential (primary) hypertension: Secondary | ICD-10-CM | POA: Diagnosis not present

## 2024-10-03 DIAGNOSIS — Z7901 Long term (current) use of anticoagulants: Secondary | ICD-10-CM | POA: Insufficient documentation

## 2024-10-03 DIAGNOSIS — R001 Bradycardia, unspecified: Secondary | ICD-10-CM

## 2024-10-03 DIAGNOSIS — I480 Paroxysmal atrial fibrillation: Secondary | ICD-10-CM

## 2024-10-03 DIAGNOSIS — R002 Palpitations: Secondary | ICD-10-CM | POA: Diagnosis present

## 2024-10-03 LAB — CBC WITH DIFFERENTIAL/PLATELET
Abs Immature Granulocytes: 0.01 K/uL (ref 0.00–0.07)
Basophils Absolute: 0 K/uL (ref 0.0–0.1)
Basophils Relative: 1 %
Eosinophils Absolute: 0.2 K/uL (ref 0.0–0.5)
Eosinophils Relative: 3 %
HCT: 45 % (ref 36.0–46.0)
Hemoglobin: 15 g/dL (ref 12.0–15.0)
Immature Granulocytes: 0 %
Lymphocytes Relative: 30 %
Lymphs Abs: 1.8 K/uL (ref 0.7–4.0)
MCH: 30.7 pg (ref 26.0–34.0)
MCHC: 33.3 g/dL (ref 30.0–36.0)
MCV: 92.2 fL (ref 80.0–100.0)
Monocytes Absolute: 0.5 K/uL (ref 0.1–1.0)
Monocytes Relative: 8 %
Neutro Abs: 3.4 K/uL (ref 1.7–7.7)
Neutrophils Relative %: 58 %
Platelets: 183 K/uL (ref 150–400)
RBC: 4.88 MIL/uL (ref 3.87–5.11)
RDW: 13 % (ref 11.5–15.5)
WBC: 5.8 K/uL (ref 4.0–10.5)
nRBC: 0 % (ref 0.0–0.2)

## 2024-10-03 LAB — BASIC METABOLIC PANEL WITH GFR
Anion gap: 13 (ref 5–15)
BUN: 25 mg/dL — ABNORMAL HIGH (ref 8–23)
CO2: 22 mmol/L (ref 22–32)
Calcium: 9.3 mg/dL (ref 8.9–10.3)
Chloride: 108 mmol/L (ref 98–111)
Creatinine, Ser: 0.96 mg/dL (ref 0.44–1.00)
GFR, Estimated: >60 mL/min (ref >60)
Glucose, Bld: 138 mg/dL — ABNORMAL HIGH (ref 70–99)
Potassium: 3.7 mmol/L (ref 3.5–5.1)
Sodium: 143 mmol/L (ref 135–145)

## 2024-10-03 LAB — PROTIME-INR
INR: 1.2 (ref 0.8–1.2)
Prothrombin Time: 16 s — ABNORMAL HIGH (ref 11.4–15.2)

## 2024-10-03 LAB — MAGNESIUM: Magnesium: 2.2 mg/dL (ref 1.7–2.4)

## 2024-10-03 MED ORDER — DILTIAZEM HCL 25 MG/5ML IV SOLN
15.0000 mg | INTRAVENOUS | Status: AC
  Administered 2024-10-03: 15 mg via INTRAVENOUS
  Filled 2024-10-03: qty 5

## 2024-10-03 MED ORDER — MAGNESIUM SULFATE 2 GM/50ML IV SOLN: 2.0000 g | Freq: Once | INTRAVENOUS | Status: DC

## 2024-10-03 MED ORDER — METOPROLOL TARTRATE 25 MG PO TABS
25.0000 mg | ORAL_TABLET | Freq: Once | ORAL | Status: AC
  Administered 2024-10-03: 25 mg via ORAL
  Filled 2024-10-03: qty 1

## 2024-10-03 NOTE — ED Notes (Signed)
 HR has come down. Pt states she no longer feels dizzy.

## 2024-10-03 NOTE — ED Notes (Signed)
 Pt to ED for Afib with a history of the same. EMS stated that pt's heart rate ranged from 100-150 bpm. PT takes Eliquis  daily,. Pt A&O x4.

## 2024-10-03 NOTE — Discharge Instructions (Signed)
 Please all of your regular medications, including the prescribed metoprolol  if your heart rate is over 110 as it was written.  Someone from the cardiology office should call and reach out to you soon to schedule a close follow-up appointment.  If you develop new or worsening symptoms that concern you, please return immediately to the emergency department.

## 2024-10-03 NOTE — ED Triage Notes (Signed)
 Pt arrived via [facility] from home with sudden onset of palpitations upon waking to go to bathroom. Pt denies chest pain and SOB. Hx/o AFib and arrives in same at a rate 120-160bpm.

## 2024-10-03 NOTE — ED Notes (Signed)
 CCMD called for cardiac monitoring.

## 2024-10-03 NOTE — Patient Instructions (Addendum)
 Medication Instructions:  Your physician recommends that you continue on your current medications as directed. Please refer to the Current Medication list given to you today.  *If you need a refill on your cardiac medications before your next appointment, please call your pharmacy*  Lab Work: No labs ordered today  If you have labs (blood work) drawn today and your tests are completely normal, you will receive your results only by: MyChart Message (if you have MyChart) OR A paper copy in the mail If you have any lab test that is abnormal or we need to change your treatment, we will call you to review the results.  Testing/Procedures: No test ordered today   Follow-Up: At [facility], you and your health needs are our priority.  As part of our continuing mission to provide you with exceptional heart care, our providers are all part of one team.  This team includes your primary Cardiologist (physician) and Advanced Practice Providers or APPs (Physician Assistants and Nurse Practitioners) who all work together to provide you with the care you need, when you need it.  Your next appointment:   Follow up with Dr. Kennyth next available  Provider:   You may see Timothy Gollan, MD or one of the following Advanced Practice Providers on your designated Care Team:   Lonni Meager, NP Lesley Maffucci, PA-C Bernardino Bring, PA-C Cadence [place], PA-C Tylene Lunch, NP Barnie Hila, NP    We recommend signing up for the patient portal called MyChart.  Sign up information is provided on this After Visit Summary.  MyChart is used to connect with patients for Virtual Visits (Telemedicine).  Patients are able to view lab/test results, encounter notes, upcoming appointments, etc.  Non-urgent messages can be sent to your provider as well.   To learn more about what you can do with MyChart, go to [email or web address].   Other Instructions: Referral to Pulmonary for A-Fib

## 2024-10-03 NOTE — ED Provider Notes (Signed)
 [facility] Provider Note    Event Date/Time   First MD Initiated Contact with Patient 10/03/24 [id number]     (approximate)   History   Palpitations   HPI Felicia Cisneros is a 73 y.o. female whose medical history includes paroxysmal atrial fibrillation on Eliquis  and whose cardiologist is Dr. Gollan.  She presents by EMS for feeling palpitations.  She states that she got up during the night to go to the bathroom and when she was getting back in bed she felt like her heart was pounding.  She had no chest pain or shortness of breath.  She has been prescribed metoprolol  but she does not take it.  She was told to take it only if she has a heart rate greater than 110.  She called 911 before remembering that she had the medication or finding it.  By the time she found it, EMS was there, and they told her not to take it and come to the hospital instead.  She said that she feels better now but she can tell that her heart rate does not feel exactly normal.  She has had no nausea or vomiting.  No recent trauma or injury.     Physical Exam   Triage Vital Signs: ED Triage Vitals  Encounter Vitals Group     BP 10/03/24 0501 (!) 172/94     Girls Systolic BP Percentile --      Girls Diastolic BP Percentile --      Boys Systolic BP Percentile --      Boys Diastolic BP Percentile --      Pulse Rate 10/03/24 0501 (!) 119     Resp 10/03/24 0501 18     Temp 10/03/24 0501 97.9 F (36.6 C)     Temp Source 10/03/24 0501 Oral     SpO2 10/03/24 0457 99 %     Weight 10/03/24 0459 82.2 kg (181 lb 3.5 oz)     Height 10/03/24 0459 1.727 m (5' 8)     Head Circumference --      Peak Flow --      Pain Score 10/03/24 0459 0     Pain Loc --      Pain Education --      Exclude from Growth Chart --     Most recent vital signs: Vitals:   10/03/24 0655 10/03/24 0700  BP:  116/71  Pulse:  73  Resp: 16 (!) 23  Temp:    SpO2:  100%    General: Awake, no distress.   Well-appearing, pleasant and conversant, making jokes with me. CV:  Good peripheral perfusion.  Normal heart sounds with irregularly irregular rhythm.  Rate ranges between 105 and 125. Resp:  Normal effort. Speaking easily and comfortably, no accessory muscle usage nor intercostal retractions.  Lungs are clear to auscultation. Abd:  No distention.    ED Results / Procedures / Treatments   Labs (all labs ordered are listed, but only abnormal results are displayed) Labs Reviewed  BASIC METABOLIC PANEL WITH GFR - Abnormal; Notable for the following components:      Result Value   Glucose, Bld 138 (*)    BUN 25 (*)    All other components within normal limits  PROTIME-INR - Abnormal; Notable for the following components:   Prothrombin Time 16.0 (*)    All other components within normal limits  CBC WITH DIFFERENTIAL/PLATELET  MAGNESIUM     EKG  ED ECG REPORT  IDarleene Dome, the attending physician, personally viewed and interpreted this ECG.  Date: 10/03/2024 EKG Time: 5:01 AM Rate: 120 Rhythm: Atrial fibrillation QRS Axis: normal Intervals: normal ST/T Wave abnormalities: Non-specific ST segment / T-wave changes, but no clear evidence of acute ischemia. Narrative Interpretation: no definitive evidence of acute ischemia; does not meet STEMI criteria.     PROCEDURES:  Critical Care performed: Yes, see critical care procedure note(s)  .Critical Care  Performed by: Dome Darleene, MD Authorized by: Dome Darleene, MD   Critical care provider statement:    Critical care time (minutes):  30   Critical care time was exclusive of:  Separately billable procedures and treating other patients   Critical care was necessary to treat or prevent imminent or life-threatening deterioration of the following conditions:  Circulatory failure   Critical care was time spent personally by me on the following activities:  Development of treatment plan with patient or surrogate, evaluation of  patient's response to treatment, examination of patient, obtaining history from patient or surrogate, ordering and performing treatments and interventions, ordering and review of laboratory studies, ordering and review of radiographic studies, pulse oximetry, re-evaluation of patient's condition and review of old charts .1-3 Lead EKG Interpretation  Performed by: Dome Darleene, MD Authorized by: Dome Darleene, MD     Interpretation: abnormal     ECG rate:  120   ECG rate assessment: tachycardic     Rhythm: atrial fibrillation     Ectopy: none     Conduction: normal       IMPRESSION / MDM / ASSESSMENT AND PLAN / ED COURSE  I reviewed the triage vital signs and the nursing notes.                              Differential diagnosis includes, but is not limited to, paroxysmal A-fib with RVR, electrolyte or metabolic abnormality, less likely ACS or PE.  Patient's presentation is most consistent with acute presentation with potential threat to life or bodily function.  Labs/studies ordered: BMP, CBC with differential, pro time-INR, magnesium level, EKG  Interventions/Medications given:  Medications  metoprolol  tartrate (LOPRESSOR ) tablet 25 mg (has no administration in time range)  diltiazem  (CARDIZEM ) injection 15 mg (15 mg Intravenous Given 10/03/24 0601)  diltiazem  (CARDIZEM ) injection 15 mg (15 mg Intravenous Given 10/03/24 0644)    (Note:  hospital course my include additional interventions and/or labs/studies not listed above.)   Patient is presenting with relatively benign palpitations with a history of paroxysmal A-fib.  She says she is absolutely compliant with her Eliquis  which is reassuring.  She is hemodynamically stable.  Labs including electrolytes are reassuring and I added on a magnesium level just to be safe.  She takes no calcium channel blockers nor beta-blockers.  And this anticoagulated patient with a known history of A-fib, it is reasonable to try a dose of  diltiazem  15 mg IV for rate control.  She is anticoagulated and hemodynamically stable and I feel that sedating her for electrical cardioversion would add a risk to an otherwise well-controlled situation.  I will reassess after the diltiazem .  The patient is on the cardiac monitor to evaluate for evidence of arrhythmia and/or significant heart rate changes.   Clinical Course as of 10/03/24 0713  Wed Oct 03, 2024  0640 Patient's heart rate has improved a bit.  She was down in the 80s and then when I walked in the room she  jumped up to about 105-110 again.  She continues to be hemodynamically stable with no symptoms at the moment.  We talked about electrical cardioversion but she is not interested in doing that and I think, as previously described, that that would be an extreme move at this point particularly given that she is anticoagulated.  She is comfortable with the plan for some additional rate control and discharge with close outpatient follow-up with cardiology.  I think that is very appropriate and reasonable.  I have ordered another dose of diltiazem  15 mg since her blood pressure can still support this, and then I will follow-up with an oral dose of medication as well.  I have put in a cardiology referral so that she can go back and see Dr. Gollan or one of his colleagues. [CF]  [id number] Patient feels better and her heart rate is down in the 70s.  Appropriate for discharge.  I gave her recommendations in terms of taking an additional dose of metoprolol  tartrate later today if she has some tachycardia and she will follow-up with Dr. Gollan.  I gave my usual and customary return precautions.  I initially considered hospitalization but I do not think it is necessary for the patient and she does not want to stay and will happily follow-up as an outpatient [CF]    Clinical Course User Index [CF] Gordan Huxley, MD     FINAL CLINICAL IMPRESSION(S) / ED DIAGNOSES   Final diagnoses:  Atrial  fibrillation with RVR (HCC)     Rx / DC Orders   ED Discharge Orders          Ordered    Ambulatory referral to Cardiology       Comments: If you have not heard from the Cardiology office within the next 72 hours please call [phone].   10/03/24 9360             Note:  This document was prepared using Dragon voice recognition software and may include unintentional dictation errors.   Gordan Huxley, MD 10/03/24 [phone]

## 2024-10-03 NOTE — Progress Notes (Signed)
 Cardiology Office Note    Date:  10/03/2024   ID:  VENETA SLITER, DOB [birth year], MRN [id number]  PCP:  Gareth Mliss FALCON, FNP  Cardiologist:  Evalene Lunger, MD  Electrophysiologist:  Fonda Kitty, MD   Chief Complaint: ED follow up  History of Present Illness:   Felicia Cisneros is a 73 y.o. female with history of atypical chest pain, paroxysmal atrial fibrillation on Eliquis , hypertension, hyperlipidemia, GERD, prediabetes, COPD, and prior tobacco use who presents for ED follow-up.    Patient was first evaluated by Dr. Gollan on 06/2013 for abnormal EKG performed in the ED and showed possible inferior infarction.  EKG at the time of her visit showed normal sinus rhythm.  Hospital EKG was suspected to be likely due to artifact or lead placement and she.  In 07/2018, she complained of episode of sharp substernal pain radiating to right lasting about 15 minutes.  She was evaluated in the ED with unremarkable workup.  She underwent exercise nuclear stress test which showed no significant ischemia with EF 83%.  Patient was seen in the office 08/2023 with for routine follow-up visit.  She reported being woken from sleep with an alert from her smart watch saying she was in atrial fibrillation.  She denied any cardiac awareness.  EKG showed normal sinus rhythm.  Given the isolated episode, decision was made to defer outpatient cardiac monitoring unless she received another alert from her smart watch.  Patient presented to the ED 11/2023 via EMS for possible A-fib as well as right sided finger numbness and tingling.  She stated she had woke up at 2 AM with numbness to her right finger and thumb along with tingling.  Her watch notified her of possible abnormal heart rhythm.  EKG demonstrated sinus bradycardia with PACs.  Telemetry demonstrated sinus arrhythmia.  BP was on the soft side and she received IV fluids.  Labs were unremarkable and troponin was negative.  Patient contacted the office and  14-day ZIO monitor was ordered revealing predominantly sinus rhythm with atrial fibrillation occurring in a 2% burden up to 2 hours and 5 minutes with average heart rate 105 bpm.  She was seen in follow-up 12/2023 and started on Eliquis .  Beta-blocker was not started secondary to baseline bradycardia.  She was evaluated by Dr. Kitty 01/2024.  Decision was made to reassess A-fib burden in 6 months and potentially proceed with ablation.  She was provided metoprolol  to tartrate 25 mg to take as needed for sustained rates greater than 110 bpm.  Echo 01/2024 showed normal LV/RV function with G1 DD and no significant valvular abnormalities.    Patient reports that she woke up this morning around 4 AM and felt her heart racing.  She denies associated chest pain, shortness of breath, lightheadedness, and dizziness.  She put on her Apple watch which alerted her that she was in atrial fibrillation.  She decided to call EMS to discuss her symptoms.  She told them that she had metoprolol  to take as needed for elevated heart rate but she reports they told her not to take it.  In the ED, BP was 172/94 and heart rate 119 bpm.  Labs overall unremarkable.  EKG showed atrial fibrillation with rate 120 bpm.  She was given IV diltiazem  15 mg x 2 and oral metoprolol  tartrate 25 mg x 1.  Heart rate improved to the 70s and she was discharged in stable condition.  At time of visit, patient reports feeling rundown and tired.  She reports resolution of her palpitations. Heart rate was initially 40s bpm in the waiting room. On her EKG, HR was 53 bpm. On my evaluation of the patient her HR was 60s bpm. She is compliant with her Eliquis  without symptoms of bleeding. She denies chest pain, shortness of breath, lightheadedness, dizziness, and lower extremity swelling.   Labs independently reviewed: 10/03/2024- Hgb 15, HCT 45, platelets 183, sodium 143, potassium 3.7, BUN 25, Cr 0.96, mag 2.2  Objective   Past Medical History:  Diagnosis  Date   Aortic atherosclerosis    a. 11/2017 noted on Chest CT - Aortic and branch vessel atherosclerosis.   Atrophic vaginitis    Centrilobular emphysema (HCC) 12/02/2016   Chest pain    a. 06/2013 ETT: Ex time 6:00, Max HR 136 (87%), 7.1 METS. No ECG changes; b. 07/2018 MV: Ex time: 6:42, nl EF, No ischemia.   COPD (chronic obstructive pulmonary disease) (HCC)    a. 11/2017 Chest CT: moderate centrilobular emphysema.   Diabetes mellitus without complication (HCC)    Diverticulitis    DNAR (do not attempt resuscitation) 06/09/2017   Discussed today, 06/09/2017   Essential hypertension    GERD (gastroesophageal reflux disease)    History of tobacco use    Hyperlipidemia    IFG (impaired fasting glucose)    Irregular heart beat 2018   Leukorrhea, not specified as infective    Osteopenia 05/27/2016   August 2017   Personal history of tobacco use, presenting hazards to health 08/20/2015   Pulmonary nodules    a. 11/2017 CT Chest: No change in bilat pulm nodules, including an ant LUL nodule - 5.28mm.   Thyroid  nodule 12/09/2022    Current Medications: Current Meds  Medication Sig   acetaminophen  (TYLENOL ) 500 MG tablet Take 1,000 mg by mouth every 6 (six) hours as needed (for pain.).   apixaban  (ELIQUIS ) 5 MG TABS tablet Take 1 tablet (5 mg total) by mouth 2 (two) times daily.   BIOTIN PO Take by mouth.   Blood Glucose Monitoring Suppl DEVI May substitute to any manufacturer covered by patient's insurance. Use glucometer as directed   clobetasol  cream (TEMOVATE ) 0.05 % Apply to labia twice daily for 3 weeks then twice weekly as directed   Coenzyme Q10-Vitamin E (QUNOL ULTRA COQ10) 100-150 MG-UNIT CAPS    famotidine  (PEPCID ) 20 MG tablet Take 1 tablet (20 mg total) by mouth 2 (two) times daily.   lisinopril  (ZESTRIL ) 5 MG tablet Take 1 tablet (5 mg total) by mouth daily. for blood pressure   Melatonin 2.5 MG CHEW Chew 2.5 mg by mouth at bedtime. (Patient taking differently: Chew 2.5 mg by  mouth at bedtime. TAKES PRN)   metoprolol  tartrate (LOPRESSOR ) 25 MG tablet TAKE 1 TABLET BY MOUTH DAILY AS  NEEDED FOR SUSTAINED HEART RATE  GREATER THAN 110   Misc Natural Products (NEURIVA PO) Take by mouth.   Multiple Vitamins-Minerals (CENTRUM SILVER 50+WOMEN) TABS Take 1 tablet by mouth daily.   simvastatin  (ZOCOR ) 40 MG tablet TAKE 1 TABLET BY MOUTH AT  BEDTIME   Current Facility-Administered Medications for the 10/03/24 encounter (Office Visit) with Lorene Lesley CROME, PA-C  Medication   clobetasol  ointment (TEMOVATE ) 0.05 %    Allergies:   Patient has no known allergies.   Social History   Socioeconomic History   Marital status: Widowed    Spouse name: Vallarie   Number of children: 1   Years of education: some college   Highest education level: 12th grade  Occupational History    Employer: [employer]  Tobacco Use   Smoking status: Former    Current packs/day: 0.00    Average packs/day: 3.0 packs/day for 45.0 years (135.0 ttl pk-yrs)    Types: Cigarettes    Start date: 10/25/1964    Quit date: 10/25/2009    Years since quitting: 14.9   Smokeless tobacco: Never   Tobacco comments:    Quit before my husband was diagnosed with small cell cancer.  Vaping Use   Vaping status: Never Used  Substance and Sexual Activity   Alcohol use: Not Currently    Comment: Rarely   Drug use: No   Sexual activity: Not Currently    Birth control/protection: None  Other Topics Concern   Not on file  Social History Narrative   Not on file   Social Drivers of Health   Financial Resource Strain: Low Risk  (09/30/2024)   Overall Financial Resource Strain (CARDIA)    Difficulty of Paying Living Expenses: Not very hard  Food Insecurity: No Food Insecurity (09/30/2024)   Hunger Vital Sign    Worried About Running Out of Food in the Last Year: Never true    Ran Out of Food in the Last Year: Never true  Transportation Needs: No Transportation Needs (09/30/2024)   PRAPARE -  Administrator, Civil Service (Medical): No    Lack of Transportation (Non-Medical): No  Physical Activity: Insufficiently Active (09/30/2024)   Exercise Vital Sign    Days of Exercise per Week: 2 days    Minutes of Exercise per Session: 50 min  Stress: Stress Concern Present (09/30/2024)   [employer] of Occupational Health - Occupational Stress Questionnaire    Feeling of Stress: To some extent  Social Connections: Moderately Integrated (09/30/2024)   Social Connection and Isolation Panel    Frequency of Communication with Friends and Family: Twice a week    Frequency of Social Gatherings with Friends and Family: Once a week    Attends Religious Services: More than 4 times per year    Active Member of [employer] or Organizations: Yes    Attends Banker Meetings: More than 4 times per year    Marital Status: Widowed     Family History:  The patient's family history includes COPD in her father; Cancer in her maternal grandfather and sister; Diabetes in her brother and mother; Hearing loss in her father and mother; Heart disease in her paternal uncle and paternal uncle; Hyperlipidemia in her maternal aunt, maternal grandmother, and mother; Hypertension in her maternal grandmother and mother; Irregular heart beat in her brother; Stroke in her maternal aunt, maternal grandmother, and mother; Thyroid  disease in her sister; Ulcerative colitis in her sister. There is no history of Breast cancer, Hematuria, Prostate cancer, or Renal cancer.  ROS:   12-point review of systems is negative unless otherwise noted in the HPI.  EKGs/Other Studies Reviewed:    Studies reviewed were summarized above. The additional studies were reviewed today:  01/2024 Echo complete 1. Left ventricular ejection fraction, by estimation, is 55 to 60%. The  left ventricle has normal function. The left ventricle has no regional  wall motion abnormalities. There is mild left ventricular hypertrophy.   Left ventricular diastolic parameters  are consistent with Grade I diastolic dysfunction (impaired relaxation).  The average left ventricular global longitudinal strain is -17.3 %. The  global longitudinal strain is normal.   2. Right ventricular systolic function is normal. The right  ventricular  size is normal.   3. The mitral valve is normal in structure. No evidence of mitral valve  regurgitation.   4. The aortic valve is tricuspid. Aortic valve regurgitation is not  visualized.   5. The inferior vena cava is normal in size with greater than 50%  respiratory variability, suggesting right atrial pressure of 3 mmHg.   12/2023 Long term monitor Normal sinus rhythm with paroxysmal atrial fibrillation Patient had a min HR of 41 bpm, max HR of 198 bpm, and avg HR of 73 bpm.    5 Supraventricular Tachycardia runs occurred, the run with the fastest interval lasting 4 beats with a max rate of 169 bpm, the longest lasting 9 beats with an avg rate of 150 bpm.    Atrial Fibrillation occurred (2% burden), ranging from 71-198 bpm (avg of 122 bpm), the longest lasting 2 hours 5 mins with an avg rate of 105 bpm.    Isolated SVEs were rare (<1.0%), SVE Couplets were rare (<1.0%), and SVE Triplets were rare (<1.0%).  Isolated VEs were rare (<1.0%), VE Couplets were rare (<1.0%), and no VE Triplets were present.  Ventricular Bigeminy and Trigeminy were present.    No patient triggered events recorded  EKG:  EKG personally reviewed by me today EKG Interpretation Date/Time:  Wednesday October 03 2024 13:57:42 EST Ventricular Rate:  53 PR Interval:  176 QRS Duration:  82 QT Interval:  472 QTC Calculation: 442 R Axis:   2  Text Interpretation: Sinus bradycardia with marked sinus arrhythmia Confirmed by Lorene Sinclair ([id number]) on 10/03/2024 2:07:43 PM  PHYSICAL EXAM:    VS:  BP (!) 135/58 (BP Location: Left Arm, Patient Position: Sitting, Cuff Size: Normal)   Pulse (!) 47 Comment: 53 on EKG  machine  Ht 5' 8 (1.727 m)   Wt 174 lb (78.9 kg)   SpO2 95%   BMI 26.46 kg/m   BMI: Body mass index is 26.46 kg/m.  GEN: Well nourished, well developed in no acute distress NECK: No JVD; No carotid bruits CARDIAC: RRR, no murmurs, rubs, gallops RESPIRATORY:  Clear to auscultation without rales, wheezing or rhonchi  ABDOMEN: Soft, non-tender, non-distended EXTREMITIES: No edema; No deformity  Wt Readings from Last 3 Encounters:  10/03/24 174 lb (78.9 kg)  10/03/24 174 lb (78.9 kg)  07/27/24 181 lb 3.2 oz (82.2 kg)                  ASSESSMENT & PLAN:   Paroxysmal atrial fibrillation Bradycardia - Zio monitor 12/2023 showed 2% burden of atrial fibrillation up to 2 hours in duration. She has baseline bradycardia. Patient had an episode of atrial fibrillation this morning which awoke her from sleep. Did not take as needed metoprolol  at the direction of EMS. She was evaluated in the ED and given IV diltiazem  15 mg x 2 and metoprolol  tartrate 25 mg x 1. She was significantly bradycardic on arrival to our clinic. HR improved to 60s bpm during visit. Her episodes have all seem to be during the nighttime. Recommend referral to pulmonology for sleep apnea evaluation. Continue metoprolol  tartrate 25 mg as needed for sustained HR > 110 bpm and Eliquis  5 mg twice daily for CHA2DS2-VASc 5.  Will have her follow-up with the EP to discuss further management.  Hypertension - BP mildly elevated today, historically well controlled. Will defer adjusting antihypertensives today given the above issues. Continue to monitor.   Hyperlipidemia - Most recent lipid panel 09/2023 with LDL 51, at goal.  Continue simvastatin . Anticipate updated lipid panel at follow up.   Disposition: F/u with Dr. Gollan or an APP in 3-4 months.   Medication Adjustments/Labs and Tests Ordered: Current medicines are reviewed at length with the patient today.  Concerns regarding medicines are outlined above. Medication  changes, Labs and Tests ordered today are summarized above and listed in the Patient Instructions accessible in Encounters.   Signed, Lesley Maffucci, PA-C 10/03/2024 4:26 PM     [facility] [place] [place], [place] [address] [phone]

## 2024-10-03 NOTE — Telephone Encounter (Signed)
 Assess pt in lobby after note from [facility] reporting Pt has an appt @2  but she in the lobby saying she feels real bad and hope she doesn't fall over  Pt HR assess at 41, placed in wheelchair, taken to room 20 for [employer] appt and VS started, reported findings to CHRISTELLA Maffucci, pt took metoprolol  tar 25 mg at approx 8 am, pt water (ok per provider), pt reports diarrhea and feeling poorly but perking up after conversation

## 2024-10-04 ENCOUNTER — Ambulatory Visit: Admitting: Family Medicine

## 2024-10-04 ENCOUNTER — Ambulatory Visit: Payer: Self-pay | Admitting: Nurse Practitioner

## 2024-10-04 ENCOUNTER — Ambulatory Visit: Admitting: Nurse Practitioner

## 2024-10-04 ENCOUNTER — Encounter: Payer: Self-pay | Admitting: Nurse Practitioner

## 2024-10-04 VITALS — BP 128/84 | HR 87 | Temp 97.9°F | Ht 68.0 in | Wt 180.0 lb

## 2024-10-04 DIAGNOSIS — E119 Type 2 diabetes mellitus without complications: Secondary | ICD-10-CM

## 2024-10-04 DIAGNOSIS — I10 Essential (primary) hypertension: Secondary | ICD-10-CM

## 2024-10-04 DIAGNOSIS — I700 Atherosclerosis of aorta: Secondary | ICD-10-CM

## 2024-10-04 DIAGNOSIS — E1169 Type 2 diabetes mellitus with other specified complication: Secondary | ICD-10-CM

## 2024-10-04 DIAGNOSIS — J432 Centrilobular emphysema: Secondary | ICD-10-CM

## 2024-10-04 DIAGNOSIS — K219 Gastro-esophageal reflux disease without esophagitis: Secondary | ICD-10-CM

## 2024-10-04 DIAGNOSIS — E782 Mixed hyperlipidemia: Secondary | ICD-10-CM

## 2024-10-04 DIAGNOSIS — I480 Paroxysmal atrial fibrillation: Secondary | ICD-10-CM | POA: Insufficient documentation

## 2024-10-04 DIAGNOSIS — R918 Other nonspecific abnormal finding of lung field: Secondary | ICD-10-CM

## 2024-10-04 DIAGNOSIS — E785 Hyperlipidemia, unspecified: Secondary | ICD-10-CM

## 2024-10-04 DIAGNOSIS — G2581 Restless legs syndrome: Secondary | ICD-10-CM

## 2024-10-04 DIAGNOSIS — E041 Nontoxic single thyroid nodule: Secondary | ICD-10-CM

## 2024-10-04 DIAGNOSIS — R7303 Prediabetes: Secondary | ICD-10-CM

## 2024-10-04 LAB — POCT GLYCOSYLATED HEMOGLOBIN (HGB A1C): Hemoglobin A1C: 6.1 % — AB (ref 4.0–5.6)

## 2024-10-04 MED ORDER — LISINOPRIL 10 MG PO TABS: 10.0000 mg | ORAL_TABLET | Freq: Every day | ORAL | 1 refills | Status: DC

## 2024-10-04 MED ORDER — ROPINIROLE HCL 0.25 MG PO TABS: 0.2500 mg | ORAL_TABLET | Freq: Every day | ORAL | 0 refills | Status: DC

## 2024-10-04 NOTE — Patient Instructions (Signed)
 Increase lisinopril  to 10 mg ( can take 2 of the 5 mg dose tablets)

## 2024-10-04 NOTE — Progress Notes (Signed)
 BP 128/84   Pulse 87   Temp 97.9 F (36.6 C)   Ht 5' 8 (1.727 m)   Wt 180 lb (81.6 kg)   SpO2 96%   BMI 27.37 kg/m    Subjective:    Patient ID: Felicia Cisneros, female    DOB: 12/18/1950, 73 y.o.   MRN: [id number]  HPI: Felicia Cisneros is a 73 y.o. female  Chief Complaint  Patient presents with   Medical Management of Chronic Issues   Discussed the use of AI scribe software for clinical note transcription with the patient, who gave verbal consent to proceed.  History of Present Illness Felicia Cisneros is a 73 year old female with atrial fibrillation and hypertension who presents for follow-up after an ER visit and cardiology consultation.  Cardiac arrhythmia and hypertension - Atrial fibrillation with no recurrent episodes since recent ER visit and cardiology follow-up yesterday - Currently taking metoprolol  25 mg daily and lisinopril  5 mg daily - Did not take blood pressure medication this morning; usually takes it at night  Musculoskeletal pain - Recent onset of pain began approximately two weeks ago, not present today - Pain is not located under the arm - Exercises regularly and recently increased weights by one pound each without exacerbation of pain - Able to carry cat litter upstairs without issue - Occasionally uses Tylenol  for pain relief, which is effective - Mammogram scheduled next week  Lower extremity discomfort and edema - Chronic leg discomfort, primarily at night, ongoing for years - Describes sensation as uncomfortable, similar to restless legs - Uses MagnaLife for relief - Wears compression socks during the day for ankle swelling - Occasionally takes Tylenol  PM or melatonin to aid sleep  Pulmonary history - History of emphysema - Participating in lung cancer study with annual low-dose CT scans - No current respiratory symptoms, including cough or shortness of breath - Former smoker  Glycemic control - Blood glucose measured at 137 mg/dL  yesterday - Hemoglobin A1c is 6.1%, indicating well-controlled diabetes - Does not take metformin ; manages diabetes with diet - Consumes sweets for breakfast - Drinks one cup of caffeinated coffee daily and milk for the remainder of the day  Thyroid  and metabolic health - History of thyroid  nodules - Overdue for TSH test and thyroid  ultrasound - History of prediabetes and osteopenia - Due for lipid panel         06/07/2024    3:31 PM 09/29/2023    8:34 AM 08/01/2023   11:30 AM  Depression screen PHQ 2/9  Decreased Interest 0 1 0  Down, Depressed, Hopeless 0 0 0  PHQ - 2 Score 0 1 0  Altered sleeping 0 3   Tired, decreased energy 0 0   Change in appetite 0 0   Feeling bad or failure about yourself  0 0   Trouble concentrating 0 0   Moving slowly or fidgety/restless 0 0   Suicidal thoughts 0 0   PHQ-9 Score 0  4    Difficult doing work/chores Not difficult at all       Data saved with a previous flowsheet row definition    Relevant past medical, surgical, family and social history reviewed and updated as indicated. Interim medical history since our last visit reviewed. Allergies and medications reviewed and updated.  Review of Systems  Ten systems reviewed and is negative except as mentioned in HPI   Objective:      BP 128/84   Pulse 87  Temp 97.9 F (36.6 C)   Ht 5' 8 (1.727 m)   Wt 180 lb (81.6 kg)   SpO2 96%   BMI 27.37 kg/m    Wt Readings from Last 3 Encounters:  10/04/24 180 lb (81.6 kg)  10/03/24 174 lb (78.9 kg)  10/03/24 174 lb (78.9 kg)    Physical Exam MEASUREMENTS: Weight- 180. GENERAL: Alert, cooperative, well developed, no acute distress HEENT: Normocephalic, normal oropharynx, moist mucous membranes CHEST: Clear to auscultation bilaterally, no wheezes, rhonchi, or crackles CARDIOVASCULAR: Normal heart rate and rhythm, S1 and S2 normal without murmurs ABDOMEN: Soft, non-tender, non-distended, without organomegaly, normal bowel  sounds EXTREMITIES: No cyanosis or edema NEUROLOGICAL: Cranial nerves grossly intact, moves all extremities without gross motor or sensory deficit  Results for orders placed or performed in visit on 10/04/24  POCT HgB A1C   Collection Time: 10/04/24  8:07 AM  Result Value Ref Range   Hemoglobin A1C 6.1 (A) 4.0 - 5.6 %   HbA1c POC (<> result, manual entry)     HbA1c, POC (prediabetic range)     HbA1c, POC (controlled diabetic range)            Assessment & Plan:   Problem List Items Addressed This Visit       Cardiovascular and Mediastinum   Essential hypertension   Relevant Medications   lisinopril  (ZESTRIL ) 10 MG tablet   Aortic atherosclerosis   Relevant Medications   lisinopril  (ZESTRIL ) 10 MG tablet   Paroxysmal atrial fibrillation (HCC)   Relevant Medications   lisinopril  (ZESTRIL ) 10 MG tablet     Respiratory   Centrilobular emphysema (HCC)   Pulmonary nodules/lesions, multiple     Digestive   GERD without esophagitis     Endocrine   Hyperlipidemia associated with type 2 diabetes mellitus (HCC)   Relevant Medications   lisinopril  (ZESTRIL ) 10 MG tablet   Other Relevant Orders   Lipid panel   Type 2 diabetes mellitus, controlled (HCC) - Primary   Relevant Medications   lisinopril  (ZESTRIL ) 10 MG tablet   Other Relevant Orders   POCT HgB A1C (Completed)   Microalbumin / creatinine urine ratio   Thyroid  nodule   Relevant Orders   TSH   US  THYROID    Other Visit Diagnoses       Prediabetes         Benign hypertension       well controlled on metformin , on ACEI and Statin, no SE or concerns, A1c very low, she proximal finger daily encouraged her to stop doing this   Relevant Medications   lisinopril  (ZESTRIL ) 10 MG tablet     Restless legs       Relevant Medications   rOPINIRole (REQUIP) 0.25 MG tablet        Assessment and Plan Assessment & Plan Essential hypertension Blood pressure is elevated at 134/100 mmHg. Current regimen includes  metoprolol  25 mg daily and lisinopril  5 mg daily. Recent cardiology visit showed blood pressure of 135/58 mmHg and heart rate of 47 bpm. No home blood pressure monitoring reported. - Increased lisinopril  to 10 mg daily. - Rechecked blood pressure before leaving the clinic.  Paroxysmal atrial fibrillation Recent Zio patch in March showed atrial fibrillation. No recent episodes reported. Cardiologist follow-up scheduled for electric device management and sleep apnea testing. - Continue current management with Eliquis  5 mg twice daily. - Follow up with cardiology for electric device management and sleep apnea testing.  Restless legs syndrome Chronic leg pain, primarily nocturnal, consistent  with restless legs syndrome. Symptoms have been present for years and are not worsening. Compression socks and MagnaLife provide some relief. - Prescribed Requip at the lowest dose for nighttime use. - Continue use of compression socks during the day.  Type 2 diabetes mellitus A1c is well-controlled at 6.1%. Blood sugar was 137 mg/dL yesterday. No current use of metformin  as diabetes is diet-controlled. - Continue current dietary management.  Thyroid  nodule Thyroid  nodules present. TSH is overdue for testing. - Ordered thyroid  ultrasound. - Ordered TSH test.  Emphysema/lung nodules -no difficulty breathing -has yearly lung cancer screenings  Felicia Cisneros -continue  famotidine  20 mg two times a day -stable  Arthrosclerosis/HLD -continue simvastatin  40 mg daily Lipid Panel     Component Value Date/Time   CHOL 128 09/29/2023 0948   CHOL 143 11/27/2015 0957   TRIG 132 09/29/2023 0948   HDL 55 09/29/2023 0948   HDL 48 11/27/2015 0957   CHOLHDL 2.3 09/29/2023 0948   VLDL 23 06/09/2017 0912   LDLCALC 51 09/29/2023 0948   LABVLDL 28 11/27/2015 0957      General Health Maintenance Due for lipid panel and TSH testing. Participating in a lung cancer study with annual low-dose CT scans. Mammogram  scheduled for next week. - Ordered lipid panel. - Ordered TSH test. - Continue annual low-dose CT scan for lung cancer screening. - Proceed with scheduled mammogram.        Follow up plan: Return in about 6 months (around 04/04/2025) for follow up.

## 2024-10-05 LAB — MICROALBUMIN / CREATININE URINE RATIO
Creatinine, Urine: 160 mg/dL (ref 20–275)
Microalb Creat Ratio: 9 mg/g{creat} (ref <30)
Microalb, Ur: 1.4 mg/dL

## 2024-10-05 LAB — LIPID PANEL
Cholesterol: 132 mg/dL (ref <200)
HDL: 51 mg/dL (ref >=50)
LDL Cholesterol (Calc): 57 mg/dL
Non-HDL Cholesterol (Calc): 81 mg/dL (ref <130)
Total CHOL/HDL Ratio: 2.6 (calc) (ref <5.0)
Triglycerides: 164 mg/dL — ABNORMAL HIGH (ref <150)

## 2024-10-05 LAB — TSH: TSH: 2.66 m[IU]/L (ref 0.40–4.50)

## 2024-10-09 ENCOUNTER — Ambulatory Visit: Admission: RE | Admit: 2024-10-09 | Discharge: 2024-10-09 | Attending: Nurse Practitioner

## 2024-10-09 DIAGNOSIS — E041 Nontoxic single thyroid nodule: Secondary | ICD-10-CM

## 2024-10-10 ENCOUNTER — Ambulatory Visit: Admitting: Pulmonary Disease

## 2024-10-11 ENCOUNTER — Inpatient Hospital Stay: Admission: RE | Admit: 2024-10-11 | Discharge: 2024-10-11 | Attending: Family Medicine

## 2024-10-11 DIAGNOSIS — Z1231 Encounter for screening mammogram for malignant neoplasm of breast: Secondary | ICD-10-CM | POA: Insufficient documentation

## 2024-10-25 ENCOUNTER — Encounter: Payer: Self-pay | Admitting: Nurse Practitioner

## 2024-10-25 DIAGNOSIS — I10 Essential (primary) hypertension: Secondary | ICD-10-CM

## 2024-10-26 ENCOUNTER — Other Ambulatory Visit: Payer: Self-pay | Admitting: Nurse Practitioner

## 2024-10-26 ENCOUNTER — Telehealth: Payer: Self-pay | Admitting: Nurse Practitioner

## 2024-10-26 ENCOUNTER — Ambulatory Visit: Payer: Self-pay

## 2024-10-26 DIAGNOSIS — E782 Mixed hyperlipidemia: Secondary | ICD-10-CM

## 2024-10-26 DIAGNOSIS — I10 Essential (primary) hypertension: Secondary | ICD-10-CM

## 2024-10-26 DIAGNOSIS — I700 Atherosclerosis of aorta: Secondary | ICD-10-CM

## 2024-10-26 NOTE — Telephone Encounter (Unsigned)
 Copied from CRM [id number]. Topic: Clinical - Medication Refill >> Oct 26, 2024  2:30 PM Amy B wrote: Medication:  simvastatin  (ZOCOR ) 40 MG tablet  lisinopril  (ZESTRIL ) 10 MG tablet  apixaban  (ELIQUIS ) 5 MG TABS tablet   Has the patient contacted their pharmacy? Yes (Agent: If no, request that the patient contact the pharmacy for the refill. If patient does not wish to contact the pharmacy document the reason why and proceed with request.) (Agent: If yes, when and what did the pharmacy advise?)  This is the patient's preferred pharmacy:   [id number] [place], [place] - [address] [place] [address] Phone: [phone] Fax: [phone]  Is this the correct pharmacy for this prescription? Yes If no, delete pharmacy and type the correct one.   Has the prescription been filled recently? No  Is the patient out of the medication? Yes  Has the patient been seen for an appointment in the last year OR does the patient have an upcoming appointment? Yes  Can we respond through MyChart? Yes  Agent: Please be advised that Rx refills may take up to 3 business days. We ask that you follow-up with your pharmacy.

## 2024-10-26 NOTE — Telephone Encounter (Signed)
 FYI Only or Action Required?: Action required by provider: requesting fill of lisinopril  as she's out of town, She has gotten the eliquis  refilled.  Patient was last seen in primary care on 10/04/2024 by Gareth Mliss FALCON, FNP.  Called Nurse Triage reporting Medication Problem.  Symptoms began yesterday.  Interventions attempted: Other: spoke with pharmacy.  Symptoms are: stable.  Triage Disposition: Call PCP Now  Patient/caregiver understands and will follow disposition?: Yes    Copied from CRM [id number]. Topic: Clinical - Red Word Triage >> Oct 26, 2024  4:14 PM Amy B wrote: Red Word that prompted transfer to Nurse Triage: patient has afib and wants to know what will happen if she does not take her meds Reason for Disposition  [1] Caller has URGENT medicine question about med that primary care doctor (or NP/PA) or specialist prescribed AND [2] triager unable to answer question  Answer Assessment - Initial Assessment Questions Caller at pharmacy waiting for rx to [facility] [place] to be transferred to [facility] where she is until Sunday. Has gotten the eliquis  refilled. Spoke with pharmacist, they are unable to supply emergency tabs until pt gets home. Advised no harm will come from missing atorvastatin dose.    1. NAME of MEDICINE: What medicine(s) are you calling about?     lisinopril  2. QUESTION: What is your question? (e.g., double dose of medicine, side effect)     What happens if she misses a dose  Protocols used: Medication Question Call-A-AH

## 2024-10-26 NOTE — Telephone Encounter (Signed)
simvastatin (ZOCOR) 40 MG tablet °

## 2024-10-29 MED ORDER — SIMVASTATIN 40 MG PO TABS: 40.0000 mg | ORAL_TABLET | Freq: Every day | ORAL | 0 refills | Status: AC

## 2024-10-29 NOTE — Telephone Encounter (Signed)
 Too soon for refill for Lisinopril .  Requested Prescriptions  Pending Prescriptions Disp Refills   simvastatin  (ZOCOR ) 40 MG tablet 100 tablet 0    Sig: Take 1 tablet (40 mg total) by mouth at bedtime.     Cardiovascular:  Antilipid - Statins Failed - 10/29/2024  9:12 AM      Failed - Lipid Panel in normal range within the last 12 months    Cholesterol, Total  Date Value Ref Range Status  11/27/2015 143 100 - 199 mg/dL Final   Cholesterol  Date Value Ref Range Status  10/04/2024 132 <200 mg/dL Final   LDL Cholesterol (Calc)  Date Value Ref Range Status  10/04/2024 57 mg/dL (calc) Final    Comment:    Reference range: <100 . Desirable range <100 mg/dL for primary prevention;   <70 mg/dL for patients with CHD or diabetic patients  with > or = 2 CHD risk factors. SABRA LDL-C is now calculated using the Martin-Hopkins  calculation, which is a validated novel method providing  better accuracy than the Friedewald equation in the  estimation of LDL-C.  Gladis APPLETHWAITE et al. SANDREA. 7986;689(80): 2061-2068  (http://education.Quest[facility].com/faq/FAQ164)    HDL  Date Value Ref Range Status  10/04/2024 51 > OR = 50 mg/dL Final  97/97/7982 48 >60 mg/dL Final   Triglycerides  Date Value Ref Range Status  10/04/2024 164 (H) <150 mg/dL Final         Passed - Patient is not pregnant      Passed - Valid encounter within last 12 months    Recent Outpatient Visits           3 weeks ago Type 2 diabetes mellitus, controlled [facility])   [facility] Gareth Mliss FALCON, FNP   6 months ago Bruising   [facility] Leavy Mole, PA-C   6 months ago Leg swelling   [facility] Bernardo Fend, DO       Future Appointments             In 1 month Kennyth Chew, MD [facility] at [facility]             lisinopril  (ZESTRIL ) 10 MG tablet 100 tablet 1    Sig: Take 1 tablet (10 mg total) by mouth  daily. for blood pressure     Cardiovascular:  ACE Inhibitors Passed - 10/29/2024  9:12 AM      Passed - Cr in normal range and within 180 days    Creat  Date Value Ref Range Status  04/03/2024 0.74 0.60 - 1.00 mg/dL Final   Creatinine, Ser  Date Value Ref Range Status  10/03/2024 0.96 0.44 - 1.00 mg/dL Final   Creatinine, Urine  Date Value Ref Range Status  10/04/2024 160 20 - 275 mg/dL Final         Passed - K in normal range and within 180 days    Potassium  Date Value Ref Range Status  10/03/2024 3.7 3.5 - 5.1 mmol/L Final  07/10/2013 3.3 (L) 3.5 - 5.1 mmol/L Final         Passed - Patient is not pregnant      Passed - Last BP in normal range    BP Readings from Last 1 Encounters:  10/04/24 128/84         Passed - Valid encounter within last 6 months    Recent Outpatient Visits  3 weeks ago Type 2 diabetes mellitus, controlled [facility])   [facility] Gareth Mliss FALCON, FNP   6 months ago Bruising   [facility] Leavy Mole, [place]   6 months ago Leg swelling   [facility] Bernardo Fend, DO       Future Appointments             In 1 month Kennyth Chew, MD [facility] at [facility]             apixaban  (ELIQUIS ) 5 MG TABS tablet 180 tablet 1    Sig: Take 1 tablet (5 mg total) by mouth 2 (two) times daily.     Hematology:  Anticoagulants - apixaban  Passed - 10/29/2024  9:12 AM      Passed - PLT in normal range and within 360 days    Platelets  Date Value Ref Range Status  10/03/2024 183 150 - 400 K/uL Final  08/22/2015 229 150 - 379 x10E3/uL Final         Passed - HGB in normal range and within 360 days    Hemoglobin  Date Value Ref Range Status  10/03/2024 15.0 12.0 - 15.0 g/dL Final  89/71/7983 85.0 11.1 - 15.9 g/dL Final         Passed - HCT in normal range and within 360 days    HCT  Date Value Ref Range Status  10/03/2024 45.0 36.0 - 46.0 %  Final   Hematocrit  Date Value Ref Range Status  08/22/2015 42.4 34.0 - 46.6 % Final         Passed - Cr in normal range and within 360 days    Creat  Date Value Ref Range Status  04/03/2024 0.74 0.60 - 1.00 mg/dL Final   Creatinine, Ser  Date Value Ref Range Status  10/03/2024 0.96 0.44 - 1.00 mg/dL Final   Creatinine, Urine  Date Value Ref Range Status  10/04/2024 160 20 - 275 mg/dL Final         Passed - AST in normal range and within 360 days    AST  Date Value Ref Range Status  04/27/2024 33 15 - 41 U/L Final    Comment:    HEMOLYSIS AT THIS LEVEL MAY AFFECT RESULT   SGOT(AST)  Date Value Ref Range Status  07/10/2013 29 15 - 37 Unit/L Final         Passed - ALT in normal range and within 360 days    ALT  Date Value Ref Range Status  04/27/2024 18 0 - 44 U/L Final    Comment:    HEMOLYSIS AT THIS LEVEL MAY AFFECT RESULT   SGPT (ALT)  Date Value Ref Range Status  07/10/2013 38 12 - 78 U/L Final         Passed - Valid encounter within last 12 months    Recent Outpatient Visits           3 weeks ago Type 2 diabetes mellitus, controlled [facility])    [facility] Gareth Mliss FALCON, FNP   6 months ago Bruising   [facility] Leavy Mole, PA-C   6 months ago Leg swelling   [facility] Bernardo Fend, DO       Future Appointments             In 1 month Kennyth Chew, MD [facility] at [facility]

## 2024-10-29 NOTE — Telephone Encounter (Signed)
 Requested medication (s) are due for refill today: yes  Requested medication (s) are on the active medication list: yes  Last refill:  01/18/24  Future visit scheduled: yes  Notes to clinic:  Unable to refill per protocol, last refill by another provider.      Requested Prescriptions  Pending Prescriptions Disp Refills   apixaban  (ELIQUIS ) 5 MG TABS tablet 180 tablet 1    Sig: Take 1 tablet (5 mg total) by mouth 2 (two) times daily.     Hematology:  Anticoagulants - apixaban  Passed - 10/29/2024  9:16 AM      Passed - PLT in normal range and within 360 days    Platelets  Date Value Ref Range Status  10/03/2024 183 150 - 400 K/uL Final  08/22/2015 229 150 - 379 x10E3/uL Final         Passed - HGB in normal range and within 360 days    Hemoglobin  Date Value Ref Range Status  10/03/2024 15.0 12.0 - 15.0 g/dL Final  89/71/7983 85.0 11.1 - 15.9 g/dL Final         Passed - HCT in normal range and within 360 days    HCT  Date Value Ref Range Status  10/03/2024 45.0 36.0 - 46.0 % Final   Hematocrit  Date Value Ref Range Status  08/22/2015 42.4 34.0 - 46.6 % Final         Passed - Cr in normal range and within 360 days    Creat  Date Value Ref Range Status  04/03/2024 0.74 0.60 - 1.00 mg/dL Final   Creatinine, Ser  Date Value Ref Range Status  10/03/2024 0.96 0.44 - 1.00 mg/dL Final   Creatinine, Urine  Date Value Ref Range Status  10/04/2024 160 20 - 275 mg/dL Final         Passed - AST in normal range and within 360 days    AST  Date Value Ref Range Status  04/27/2024 33 15 - 41 U/L Final    Comment:    HEMOLYSIS AT THIS LEVEL MAY AFFECT RESULT   SGOT(AST)  Date Value Ref Range Status  07/10/2013 29 15 - 37 Unit/L Final         Passed - ALT in normal range and within 360 days    ALT  Date Value Ref Range Status  04/27/2024 18 0 - 44 U/L Final    Comment:    HEMOLYSIS AT THIS LEVEL MAY AFFECT RESULT   SGPT (ALT)  Date Value Ref Range Status   07/10/2013 38 12 - 78 U/L Final         Passed - Valid encounter within last 12 months    Recent Outpatient Visits           3 weeks ago Type 2 diabetes mellitus, controlled (HCC)   [facility] Gareth Mliss FALCON, FNP   6 months ago Bruising   [facility] Leavy Mole, PA-C   6 months ago Leg swelling   [facility] Bernardo Fend, DO       Future Appointments             In 1 month Kennyth Chew, MD [facility] at [facility]            Signed Prescriptions Disp Refills   simvastatin  (ZOCOR ) 40 MG tablet 100 tablet 0    Sig: Take 1 tablet (40 mg total) by mouth at bedtime.  Cardiovascular:  Antilipid - Statins Failed - 10/29/2024  9:16 AM      Failed - Lipid Panel in normal range within the last 12 months    Cholesterol, Total  Date Value Ref Range Status  11/27/2015 143 100 - 199 mg/dL Final   Cholesterol  Date Value Ref Range Status  10/04/2024 132 <200 mg/dL Final   LDL Cholesterol (Calc)  Date Value Ref Range Status  10/04/2024 57 mg/dL (calc) Final    Comment:    Reference range: <100 . Desirable range <100 mg/dL for primary prevention;   <70 mg/dL for patients with CHD or diabetic patients  with > or = 2 CHD risk factors. SABRA LDL-C is now calculated using the Martin-Hopkins  calculation, which is a validated novel method providing  better accuracy than the Friedewald equation in the  estimation of LDL-C.  Gladis APPLETHWAITE et al. SANDREA. 7986;689(80): 2061-2068  (http://education.[facility].com/faq/FAQ164)    HDL  Date Value Ref Range Status  10/04/2024 51 > OR = 50 mg/dL Final  97/97/7982 48 >60 mg/dL Final   Triglycerides  Date Value Ref Range Status  10/04/2024 164 (H) <150 mg/dL Final         Passed - Patient is not pregnant      Passed - Valid encounter within last 12 months    Recent Outpatient Visits           3 weeks ago Type 2 diabetes  mellitus, controlled [facility])   [facility] Gareth Mliss FALCON, FNP   6 months ago Bruising   [facility] Leavy Mole, PA-C   6 months ago Leg swelling   [facility] Bernardo Fend, DO       Future Appointments             In 1 month Kennyth Chew, MD [facility]Care at [facility] Prescriptions Disp Refills   lisinopril  (ZESTRIL ) 10 MG tablet 100 tablet 1    Sig: Take 1 tablet (10 mg total) by mouth daily. for blood pressure     Cardiovascular:  ACE Inhibitors Passed - 10/29/2024  9:16 AM      Passed - Cr in normal range and within 180 days    Creat  Date Value Ref Range Status  04/03/2024 0.74 0.60 - 1.00 mg/dL Final   Creatinine, Ser  Date Value Ref Range Status  10/03/2024 0.96 0.44 - 1.00 mg/dL Final   Creatinine, Urine  Date Value Ref Range Status  10/04/2024 160 20 - 275 mg/dL Final         Passed - K in normal range and within 180 days    Potassium  Date Value Ref Range Status  10/03/2024 3.7 3.5 - 5.1 mmol/L Final  07/10/2013 3.3 (L) 3.5 - 5.1 mmol/L Final         Passed - Patient is not pregnant      Passed - Last BP in normal range    BP Readings from Last 1 Encounters:  10/04/24 128/84         Passed - Valid encounter within last 6 months    Recent Outpatient Visits           3 weeks ago Type 2 diabetes mellitus, controlled [facility])   [facility] Gareth Mliss FALCON, FNP   6 months ago Bruising   [facility] Leavy Mole,  PA-C   6 months ago Leg swelling   [facility] Bernardo Fend, DO       Future Appointments             In 1 month Kennyth Chew, MD [facility] at [facility]

## 2024-11-07 ENCOUNTER — Ambulatory Visit: Admitting: Pulmonary Disease

## 2024-11-07 ENCOUNTER — Encounter: Payer: Self-pay | Admitting: Pulmonary Disease

## 2024-11-07 VITALS — BP 124/72 | HR 72 | Ht 68.0 in | Wt 181.0 lb

## 2024-11-07 DIAGNOSIS — J439 Emphysema, unspecified: Secondary | ICD-10-CM | POA: Diagnosis not present

## 2024-11-07 DIAGNOSIS — Z87891 Personal history of nicotine dependence: Secondary | ICD-10-CM | POA: Diagnosis not present

## 2024-11-07 DIAGNOSIS — J432 Centrilobular emphysema: Secondary | ICD-10-CM

## 2024-11-07 NOTE — Progress Notes (Signed)
 "  Synopsis: Referred in by Lorene Lesley CROME, PA-C   Subjective:   PATIENT ID: Felicia Cisneros Nett GENDER: female DOB: 1950/10/27, MRN: [id number]  Chief Complaint  Patient presents with   Establish Care    Possible COPD, former smoker    HPI Discussed the use of AI scribe software for clinical note transcription with the patient, who gave verbal consent to proceed.  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 She was referred by her cardiologist, Lesley, for evaluation of emphysema.  She has been undergoing lung cancer screening, and she reports that the results mention COPD and emphysema. No respiratory symptoms such as cough, wheezing, or chest tightness are present. She also experiences no shortness of breath during physical activities, including walking uphill.  She has a history of atrial fibrillation, discovered after an episode of vertigo that led to a fall. She is currently on medication for atrial fibrillation. She is on Eliquis  to prevent blood clots due to her atrial fibrillation.  She experienced an arthritis flare-up in her ankles during a cruise in late December, which significantly impaired her mobility. The pain subsided after she returned home and wore tennis shoes with Good Feet pads.  She quit smoking approximately fifteen years ago after previously smoking about three packs a day. She is not exposed to smoke in her current environment.  Her family history includes strokes in her mother and great grandmother.        Family History  Problem Relation Age of Onset   Stroke Mother    Hypertension Mother    Hyperlipidemia Mother    Diabetes Mother        pre-diabetic   Hearing loss Mother    Diabetes Brother    Irregular heart beat Brother    COPD Father    Hearing loss Father    Thyroid  disease Sister    Stroke Maternal Grandmother    Hyperlipidemia Maternal Grandmother     Hypertension Maternal Grandmother    Cancer Maternal Grandfather        bone   Cancer Sister        cystic fibroid carcinoma   Ulcerative colitis Sister    Heart disease Paternal Uncle    Heart disease Paternal Uncle    Hyperlipidemia Maternal Aunt    Stroke Maternal Aunt    Breast cancer Neg Hx    Hematuria Neg Hx    Prostate cancer Neg Hx    Renal cancer Neg Hx      Social History   Socioeconomic History   Marital status: Widowed    Spouse name: Felicia Cisneros   Number of children: 1   Years of education: some college   Highest education level: 12th grade  Occupational History    Employer: [employer]  Tobacco Use   Smoking status: Former    Current packs/day: 0.00    Average packs/day: 3.0 packs/day for 45.0 years (135.0 ttl pk-yrs)    Types: Cigarettes    Start date: 10/25/1964    Quit date: 10/25/2009    Years since quitting: 15.0   Smokeless tobacco: Never   Tobacco comments:    Quit before my husband was diagnosed with small cell cancer.  Vaping Use   Vaping status: Never Used  Substance and Sexual Activity   Alcohol use: Not Currently    Comment: Rarely   Drug use: No  Sexual activity: Not Currently    Birth control/protection: None  Other Topics Concern   Not on file  Social History Narrative   Not on file   Social Drivers of Health   Tobacco Use: Medium Risk (11/07/2024)   Patient History    Smoking Tobacco Use: Former    Smokeless Tobacco Use: Never    Passive Exposure: Not on file  Financial Resource Strain: Low Risk (09/30/2024)   Overall Financial Resource Strain (CARDIA)    Difficulty of Paying Living Expenses: Not very hard  Food Insecurity: No Food Insecurity (09/30/2024)   Epic    Worried About Radiation Protection Practitioner of Food in the Last Year: Never true    Ran Out of Food in the Last Year: Never true  Transportation Needs: No Transportation Needs (09/30/2024)   Epic    Lack of Transportation (Medical): No    Lack of Transportation  (Non-Medical): No  Physical Activity: Insufficiently Active (09/30/2024)   Exercise Vital Sign    Days of Exercise per Week: 2 days    Minutes of Exercise per Session: 50 min  Stress: Stress Concern Present (09/30/2024)   [employer] of Occupational Health - Occupational Stress Questionnaire    Feeling of Stress: To some extent  Social Connections: Moderately Integrated (09/30/2024)   Social Connection and Isolation Panel    Frequency of Communication with Friends and Family: Twice a week    Frequency of Social Gatherings with Friends and Family: Once a week    Attends Religious Services: More than 4 times per year    Active Member of [employer] or Organizations: Yes    Attends Banker Meetings: More than 4 times per year    Marital Status: Widowed  Intimate Partner Violence: Not At Risk (06/07/2024)   Epic    Fear of Current or Ex-Partner: No    Emotionally Abused: No    Physically Abused: No    Sexually Abused: No  Depression (PHQ2-9): Low Risk (06/07/2024)   Depression (PHQ2-9)    PHQ-2 Score: 0  Alcohol Screen: Low Risk (09/30/2024)   Alcohol Screen    Last Alcohol Screening Score (AUDIT): 1  Housing: Low Risk (09/30/2024)   Epic    Unable to Pay for Housing in the Last Year: No    Number of Times Moved in the Last Year: 0    Homeless in the Last Year: No  Utilities: Not At Risk (06/07/2024)   Epic    Threatened with loss of utilities: No  Health Literacy: Adequate Health Literacy (06/07/2024)   B1300 Health Literacy    Frequency of need for help with medical instructions: Never        Objective:   Vitals:   11/07/24 0902  BP: 124/72  Pulse: 72  SpO2: 97%  Weight: 181 lb (82.1 kg)  Height: 5' 8 (1.727 m)   97% on  RA BMI Readings from Last 3 Encounters:  11/07/24 27.52 kg/m  10/04/24 27.37 kg/m  10/03/24 26.46 kg/m   Wt Readings from Last 3 Encounters:  11/07/24 181 lb (82.1 kg)  10/04/24 180 lb (81.6 kg)  10/03/24 174 lb (78.9 kg)     Physical Exam GEN: NAD, Healthy Appearing HEENT: Supple Neck, Reactive Pupils, EOMI  CVS: Normal S1, Normal S2, RRR, No murmurs or ES appreciated  Lungs: Clear bilateral air entry.  Abdomen: Soft, non tender, non distended, + BS  Extremities: Warm and well perfused, No edema   Ancillary Information   CBC  Component Value Date/Time   WBC 5.8 10/03/2024 0514   RBC 4.88 10/03/2024 0514   HGB 15.0 10/03/2024 0514   HGB 14.9 08/22/2015 0821   HCT 45.0 10/03/2024 0514   HCT 42.4 08/22/2015 0821   PLT 183 10/03/2024 0514   PLT 229 08/22/2015 0821   MCV 92.2 10/03/2024 0514   MCV 90 08/22/2015 0821   MCV 90 07/10/2013 1315   MCH 30.7 10/03/2024 0514   MCHC 33.3 10/03/2024 0514   RDW 13.0 10/03/2024 0514   RDW 13.3 08/22/2015 0821   RDW 13.7 07/10/2013 1315   LYMPHSABS 1.8 10/03/2024 0514   LYMPHSABS 1.9 08/22/2015 0821   MONOABS 0.5 10/03/2024 0514   EOSABS 0.2 10/03/2024 0514   EOSABS 0.2 08/22/2015 0821   BASOSABS 0.0 10/03/2024 0514   BASOSABS 0.1 08/22/2015 0821       No data to display           Assessment & Plan:  Assessment and Plan    #Emphysema No current COPD symptoms. Preferred to defer pulmonary function test due to lack of symptoms. - Continue annual lung cancer screening. - Return for evaluation if symptoms develop.  #Lung cancer screening Annual CT scan showed no nodules or concerning findings. Discussed husband's history of small cell lung cancer and the curative potential of early detection. - Continue annual lung cancer screening with CT scan.      Return if symptoms worsen or fail to improve.  I personally spent a total of 45 minutes in the care of the patient today including preparing to see the patient, getting/reviewing separately obtained history, performing a medically appropriate exam/evaluation, counseling and educating, documenting clinical information in the EHR, independently interpreting results, and communicating results.    Darrin Barn, MD [place] [facility] 11/07/2024 9:41 AM  "

## 2024-11-20 ENCOUNTER — Encounter: Payer: Self-pay | Admitting: Nurse Practitioner

## 2024-11-21 ENCOUNTER — Telehealth: Payer: Self-pay | Admitting: Nurse Practitioner

## 2024-11-21 NOTE — Telephone Encounter (Signed)
 Asking for refill to soon

## 2024-11-21 NOTE — Telephone Encounter (Signed)
 [facility] faxed refill request for the following medications:  lisinopril  (ZESTRIL ) 10 MG tablet    Please advise.

## 2024-11-26 ENCOUNTER — Other Ambulatory Visit: Payer: Self-pay | Admitting: Nurse Practitioner

## 2024-11-26 DIAGNOSIS — G2581 Restless legs syndrome: Secondary | ICD-10-CM

## 2024-11-27 NOTE — Telephone Encounter (Signed)
 Requested Prescriptions  Refused Prescriptions Disp Refills   famotidine  (PEPCID ) 20 MG tablet [Pharmacy Med Name: Famotidine  20 MG Oral Tablet] 180 tablet 3    Sig: TAKE 1 TABLET BY MOUTH TWICE  DAILY     Gastroenterology:  H2 Antagonists Passed - 11/27/2024  4:53 PM      Passed - Valid encounter within last 12 months    Recent Outpatient Visits           1 month ago Type 2 diabetes mellitus, controlled (HCC)   [place] [facility] Gareth Mliss FALCON, FNP   7 months ago Bruising   [facility] Leavy Mole, PA-C   7 months ago Leg swelling   [facility] Bernardo Fend, DO       Future Appointments             Tomorrow Kennyth Chew, MD [facility]             rOPINIRole  (REQUIP ) 0.25 MG tablet [Pharmacy Med Name: rOPINIRole  HCl 0.25 MG Oral Tablet] 90 tablet 3    Sig: TAKE 1 TABLET BY MOUTH AT  BEDTIME     Neurology:  Parkinsonian Agents Passed - 11/27/2024  4:53 PM      Passed - Last BP in normal range    BP Readings from Last 1 Encounters:  11/07/24 124/72         Passed - Last Heart Rate in normal range    Pulse Readings from Last 1 Encounters:  11/07/24 72         Passed - Valid encounter within last 12 months    Recent Outpatient Visits           1 month ago Type 2 diabetes mellitus, controlled (HCC)   [facility] Gareth Mliss FALCON, FNP   7 months ago Bruising   [facility] Leavy Mole, PA-C   7 months ago Leg swelling   [facility] Bernardo Fend, [place]       Future Appointments             Tomorrow Kennyth Chew, MD [facility] at [facility]

## 2024-11-28 ENCOUNTER — Encounter: Payer: Self-pay | Admitting: Cardiology

## 2024-11-28 ENCOUNTER — Ambulatory Visit: Admitting: Cardiology

## 2024-11-28 VITALS — BP 118/64 | HR 82 | Ht 68.0 in | Wt 182.4 lb

## 2024-11-28 DIAGNOSIS — I700 Atherosclerosis of aorta: Secondary | ICD-10-CM

## 2024-11-28 DIAGNOSIS — R001 Bradycardia, unspecified: Secondary | ICD-10-CM | POA: Diagnosis not present

## 2024-11-28 DIAGNOSIS — I495 Sick sinus syndrome: Secondary | ICD-10-CM

## 2024-11-28 DIAGNOSIS — D6869 Other thrombophilia: Secondary | ICD-10-CM | POA: Diagnosis not present

## 2024-11-28 DIAGNOSIS — I480 Paroxysmal atrial fibrillation: Secondary | ICD-10-CM

## 2024-11-28 NOTE — Patient Instructions (Signed)
 Medication Instructions:  Your physician recommends that you continue on your current medications as directed. Please refer to the Current Medication list given to you today.  *If you need a refill on your cardiac medications before your next appointment, please call your pharmacy*  Testing/Procedures:  Ablation Your physician has recommended that you have an ablation. Catheter ablation is a medical procedure used to treat some cardiac arrhythmias (irregular heartbeats). During catheter ablation, a long, thin, flexible tube is put into a blood vessel in your groin (upper thigh), or neck. This tube is called an ablation catheter. It is then guided to your heart through the blood vessel. Radio frequency waves destroy small areas of heart tissue where abnormal heartbeats may cause an arrhythmia to start.    Tikosyn (Dofetilide) Hospital Admission   Prior to day of admission:  Check with drug insurance company for cost of drug to ensure affordability --- Dofetilide 500 mcg twice a day.  GoodRx is an option if insurance copay is unaffordable.    No Benadryl is allowed 3 days prior to admission.   Please ensure no missed doses of your anticoagulation (blood thinner) for 3 weeks prior to admission. If a dose is missed please notify our office immediately.   A pharmacist will review all your medications for potential interactions with Tikosyn. If any medication changes are needed prior to admission we will be in touch with you.   If any new medications are started AFTER your admission date is set with [facility]. Please notify our office immediately so your medication list can be updated and reviewed by our pharmacist again.  On day of admission:  Tikosyn initiation requires a 3 night/4 day hospital stay with constant telemetry monitoring. You will have an EKG after each dose of Tikosyn as well as daily lab draws.   If the drug does not convert you to normal rhythm a cardioversion after the 4th dose  of Tikosyn.   [facility] office visit on the morning of admission is needed for preliminary labs/ekg.   Time of admission is dependent on bed availability in the hospital. In some instances, you will be sent home until bed is available. Rarely admission can be delayed to the following day if hospital census prevents available beds.   You may bring personal belongings/clothing with you to the hospital. Please leave your suitcase in the car until you arrive in admissions.   Questions please call our office at [phone]     Follow-Up: At [facility], you and your health needs are our priority.  As part of our continuing mission to provide you with exceptional heart care, our providers are all part of one team.  This team includes your primary Cardiologist (physician) and Advanced Practice Providers or APPs (Physician Assistants and Nurse Practitioners) who all work together to provide you with the care you need, when you need it.  Call us  at [phone] if you would like to proceed with an ablation or admission for Tikosyn

## 2024-11-28 NOTE — Progress Notes (Signed)
 " Electrophysiology Office Note:   Date:  11/28/2024  ID:  Felicia Cisneros, DOB 1950/12/24, MRN [id number]  Primary Cardiologist: Evalene Lunger, MD Electrophysiologist: Fonda Kitty, MD      History of Present Illness:   Felicia Cisneros is a 74 y.o. female with h/o  atypical chest pain, paroxysmal atrial fibrillation on Eliquis , hypertension, hyperlipidemia, GERD, prediabetes, COPD, and prior tobacco use who is being seen today for EP evaluation.   Discussed the use of AI scribe software for clinical note transcription with the patient, who gave verbal consent to proceed.  History of Present Illness Felicia Cisneros is a 74 year old female with atrial fibrillation who presents for evaluation of her arrhythmia management.  She has a history of atrial fibrillation, initially detected by her watch and confirmed by a monitor about a year ago. Her episodes are infrequent, but she experiences palpitations, especially at night when lying down. She describes her heart rhythm as 'boom, boom, boom' and uses a finger monitor to check her heart rate, which she initially misread. Her heart rate during these episodes was recorded at 96 beats per minute.  In December, she had an episode of atrial fibrillation that led her to the emergency department due to a fast heart rate. Subsequent episodes have occurred, one of which prompted her to call an ambulance out of fear. She was advised to take a pill if her heart rate exceeds 110 beats per minute, which she received in the emergency room.  Her current medications include Eliquis , simvastatin , lisinopril , and famotidine . She also takes magnesium  supplements, recommended by her brother.  Her family history is significant for strokes in her mother, grandmother, and great-grandmother. Her mother also had a pacemaker, and her brother has undergone ablations for heart rhythm issues. [place] life alert system in place. She is planning  to move back to [facility] to be closer to her sisters.  She is a former heavy smoker and has been participating in a cancer study for the past ten years, with her last low-dose CT scan showing no issues. Her husband, who was a psychologist, occupational, passed away from small cell lung cancer after three years of treatment.   Review of systems complete and found to be negative unless listed in HPI.   EP Information / Studies Reviewed:    EKG is ordered today. Personal review as below.  EKG Interpretation Date/Time:  Wednesday November 28 2024 15:57:33 EST Ventricular Rate:  82 PR Interval:  176 QRS Duration:  78 QT Interval:  376 QTC Calculation: 439 R Axis:   -19  Text Interpretation: Normal sinus rhythm Possible Left atrial enlargement Inferior infarct , age undetermined Anterior infarct , age undetermined When compared with ECG of 03-Oct-2024 13:57, Vent. rate has increased BY  29 BPM Anterior infarct is now Present Confirmed by Kitty Fonda [phone]) on 11/28/2024 4:05:55 PM   ECG 10/03/24: AF   Zio 01/03/24:  Event monitor Patch Wear Time:  13 days and 0 hours (2025-02-12T21:50:03-499 to 2025-02-25T22:03:10-498)   Normal sinus rhythm with paroxysmal atrial fibrillation Patient had a min HR of 41 bpm, max HR of 198 bpm, and avg HR of 73 bpm.    5 Supraventricular Tachycardia runs occurred, the run with the fastest interval lasting 4 beats with a max rate of 169 bpm, the longest lasting 9 beats with an avg rate of 150 bpm.    Atrial Fibrillation occurred (2% burden), ranging from 71-198 bpm (  avg of 122 bpm), the longest lasting 2 hours 5 mins with an avg rate of 105 bpm.    Isolated SVEs were rare (<1.0%), SVE Couplets were rare (<1.0%), and SVE Triplets were rare (<1.0%).  Isolated VEs were rare (<1.0%), VE Couplets were rare (<1.0%), and no VE Triplets were present.  Ventricular Bigeminy and Trigeminy were present.   Echo 02/20/24:   1. Left ventricular ejection fraction, by estimation, is  55 to 60%. The  left ventricle has normal function. The left ventricle has no regional  wall motion abnormalities. There is mild left ventricular hypertrophy.  Left ventricular diastolic parameters  are consistent with Grade I diastolic dysfunction (impaired relaxation).  The average left ventricular global longitudinal strain is -17.3 %. The  global longitudinal strain is normal.   2. Right ventricular systolic function is normal. The right ventricular  size is normal.   3. The mitral valve is normal in structure. No evidence of mitral valve  regurgitation.   4. The aortic valve is tricuspid. Aortic valve regurgitation is not  visualized.   5. The inferior vena cava is normal in size with greater than 50%  respiratory variability, suggesting right atrial pressure of 3 mmHg.   Nuclear Stress 08/18/18: Exercise myocardial perfusion imaging study with no significant ischemia Normal wall motion, EF estimated at 83% No EKG changes concerning for ischemia at peak stress or in recovery. Target heart rate achieved 7 METS, exercise time 6 minutes 42 seconds Low risk scan  Risk Assessment/Calculations:    CHA2DS2-VASc Score = 5   This indicates a 7.2% annual risk of stroke. The patient's score is based upon: CHF History: 0 HTN History: 1 Diabetes History: 1 Stroke History: 0 Vascular Disease History: 1 Age Score: 1 Gender Score: 1             Physical Exam:   VS:  BP 118/64 (BP Location: Left Arm, Patient Position: Sitting, Cuff Size: Large)   Pulse 82   Ht 5' 8 (1.727 m)   Wt 182 lb 6.4 oz (82.7 kg)   SpO2 97%   BMI 27.73 kg/m    Wt Readings from Last 3 Encounters:  11/28/24 182 lb 6.4 oz (82.7 kg)  11/07/24 181 lb (82.1 kg)  10/04/24 180 lb (81.6 kg)    General: Well developed, in no acute distress.  Neck: No JVD.  Cardiac: Normal rate, regular rhythm.  Resp: Normal work of breathing.  Ext: No edema.  Neuro: No gross focal deficits.  Psych: Normal affect.    ASSESSMENT AND PLAN:    #Paroxysmal atrial fibrillation: Symptomatic. Recent ED visit for AF with RVR. #Sinus bradycardia: Rates have fluctuated.  #Tachycardia-bradycardia syndrome -Discussed rhythm control options today for AF including antiarrhythmic drug therapy and ablation, in efforts to prevent future episodes and recurrent ED visits/hospitalizations. Unfortunately, due to her bradycardia, medication options are limited. No standing rate control, only as needed, for this reason. Could probably tolerate Tikosyn. Discussed risks, recovery and likelihood of success with each treatment strategy. Risk, benefits, and alternatives to EP study and ablation for afib were discussed. These risks include but are not limited to stroke, bleeding, vascular damage, tamponade, perforation, damage to the esophagus, lungs, phrenic nerve and other structures, pulmonary vein stenosis, worsening renal function, coronary vasospasm and death.  Discussed potential need for repeat ablation procedures and antiarrhythmic drugs after an initial ablation. The patient understands these risk and wishes to think about her options. She will let us  know if she chooses Tikosyn  or catheter ablation.   #Hypercoagulable state due to AF:  -Continue Eliquis  75m BID. Denies bleeding issues.    Follow up with EP Team in 3 months  Signed, Fonda Kitty, MD  "

## 2024-11-29 ENCOUNTER — Telehealth: Payer: Self-pay | Admitting: Cardiovascular Disease

## 2024-11-29 ENCOUNTER — Encounter: Payer: Self-pay | Admitting: Nurse Practitioner

## 2024-11-29 DIAGNOSIS — G2581 Restless legs syndrome: Secondary | ICD-10-CM

## 2024-11-29 DIAGNOSIS — D6869 Other thrombophilia: Secondary | ICD-10-CM

## 2024-11-29 DIAGNOSIS — I10 Essential (primary) hypertension: Secondary | ICD-10-CM

## 2024-11-29 DIAGNOSIS — I480 Paroxysmal atrial fibrillation: Secondary | ICD-10-CM

## 2024-11-29 MED ORDER — LISINOPRIL 10 MG PO TABS: 10.0000 mg | ORAL_TABLET | Freq: Every day | ORAL | 1 refills | Status: AC

## 2024-11-29 MED ORDER — ROPINIROLE HCL 0.25 MG PO TABS: 0.2500 mg | ORAL_TABLET | Freq: Every day | ORAL | 0 refills | Status: AC

## 2024-11-29 NOTE — Telephone Encounter (Signed)
 Pt calling to schedule a appointment for her procedure. Please advise

## 2024-11-29 NOTE — Telephone Encounter (Signed)
 Spoke with the patient who states that she would like to proceed with scheduling an ablation with Dr. Kennyth. She has been scheduled for 3/13.

## 2025-01-04 ENCOUNTER — Encounter (HOSPITAL_COMMUNITY): Payer: Self-pay

## 2025-01-04 ENCOUNTER — Ambulatory Visit (HOSPITAL_COMMUNITY): Admit: 2025-01-04 | Admitting: Cardiology

## 2025-04-04 ENCOUNTER — Ambulatory Visit: Admitting: Nurse Practitioner

## 2025-06-13 ENCOUNTER — Ambulatory Visit
